# Patient Record
Sex: Male | Born: 1940
Health system: Southern US, Community
[De-identification: ages and names within clinical notes are randomized; demographics above are authoritative.]

## PROBLEM LIST (undated history)

## (undated) DIAGNOSIS — I499 Cardiac arrhythmia, unspecified: Secondary | ICD-10-CM

## (undated) DIAGNOSIS — M109 Gout, unspecified: Secondary | ICD-10-CM

## (undated) DIAGNOSIS — I480 Paroxysmal atrial fibrillation: Secondary | ICD-10-CM

## (undated) DIAGNOSIS — I10 Essential (primary) hypertension: Secondary | ICD-10-CM

## (undated) DIAGNOSIS — E785 Hyperlipidemia, unspecified: Secondary | ICD-10-CM

## (undated) DIAGNOSIS — H269 Unspecified cataract: Secondary | ICD-10-CM

## (undated) DIAGNOSIS — S129XXA Fracture of neck, unspecified, initial encounter: Secondary | ICD-10-CM

## (undated) DIAGNOSIS — M1990 Unspecified osteoarthritis, unspecified site: Secondary | ICD-10-CM

## (undated) DIAGNOSIS — C189 Malignant neoplasm of colon, unspecified: Secondary | ICD-10-CM

## (undated) DIAGNOSIS — D171 Benign lipomatous neoplasm of skin and subcutaneous tissue of trunk: Secondary | ICD-10-CM

## (undated) DIAGNOSIS — K635 Polyp of colon: Secondary | ICD-10-CM

## (undated) DIAGNOSIS — I639 Cerebral infarction, unspecified: Secondary | ICD-10-CM

## (undated) HISTORY — PX: COLON RESECTION: SHX5231

## (undated) HISTORY — DX: Paroxysmal atrial fibrillation: I48.0

## (undated) HISTORY — DX: Polyp of colon: K63.5

## (undated) HISTORY — DX: Essential (primary) hypertension: I10

## (undated) HISTORY — PX: SHOULDER SURGERY: SHX246

## (undated) HISTORY — PX: CATARACT EXTRACTION EXTRACAPSULAR: SHX1305

## (undated) HISTORY — PX: OTHER SURGICAL HISTORY: SHX169

## (undated) HISTORY — PX: VASECTOMY: SHX75

## (undated) HISTORY — PX: ANKLE SURGERY: SHX546

## (undated) HISTORY — DX: Fracture of neck, unspecified, initial encounter: S12.9XXA

## (undated) HISTORY — DX: Hyperlipidemia, unspecified: E78.5

## (undated) HISTORY — PX: ELBOW FRACTURE SURGERY: SHX616

## (undated) HISTORY — DX: Malignant neoplasm of colon, unspecified: C18.9

## (undated) HISTORY — DX: Unspecified cataract: H26.9

## (undated) HISTORY — PX: HERNIA REPAIR: SHX51

---

## 1998-05-25 ENCOUNTER — Emergency Department (HOSPITAL_COMMUNITY): Admission: EM | Admit: 1998-05-25 | Discharge: 1998-05-25 | Payer: Self-pay | Admitting: Emergency Medicine

## 1999-11-18 HISTORY — PX: APPENDECTOMY: SHX54

## 2000-06-30 ENCOUNTER — Encounter: Admission: RE | Admit: 2000-06-30 | Discharge: 2000-06-30 | Payer: Self-pay | Admitting: Endocrinology

## 2000-06-30 ENCOUNTER — Encounter: Payer: Self-pay | Admitting: Endocrinology

## 2000-07-01 ENCOUNTER — Encounter: Admission: RE | Admit: 2000-07-01 | Discharge: 2000-07-01 | Payer: Self-pay | Admitting: Endocrinology

## 2000-07-01 ENCOUNTER — Encounter: Payer: Self-pay | Admitting: Endocrinology

## 2000-07-13 ENCOUNTER — Encounter: Admission: RE | Admit: 2000-07-13 | Discharge: 2000-07-13 | Payer: Self-pay | Admitting: General Surgery

## 2000-07-14 ENCOUNTER — Encounter: Admission: RE | Admit: 2000-07-14 | Discharge: 2000-07-14 | Payer: Self-pay | Admitting: General Surgery

## 2000-07-14 ENCOUNTER — Encounter: Payer: Self-pay | Admitting: General Surgery

## 2000-09-17 ENCOUNTER — Encounter: Payer: Self-pay | Admitting: General Surgery

## 2000-09-22 ENCOUNTER — Inpatient Hospital Stay (HOSPITAL_COMMUNITY): Admission: RE | Admit: 2000-09-22 | Discharge: 2000-09-29 | Payer: Self-pay | Admitting: General Surgery

## 2000-09-28 ENCOUNTER — Encounter: Payer: Self-pay | Admitting: General Surgery

## 2000-10-30 ENCOUNTER — Ambulatory Visit (HOSPITAL_COMMUNITY): Admission: RE | Admit: 2000-10-30 | Discharge: 2000-10-30 | Payer: Self-pay | Admitting: General Surgery

## 2000-10-30 ENCOUNTER — Encounter: Payer: Self-pay | Admitting: General Surgery

## 2000-11-02 ENCOUNTER — Encounter: Admission: RE | Admit: 2000-11-02 | Discharge: 2000-11-02 | Payer: Self-pay | Admitting: Oncology

## 2000-11-02 ENCOUNTER — Encounter: Payer: Self-pay | Admitting: Oncology

## 2001-01-14 ENCOUNTER — Encounter: Payer: Self-pay | Admitting: General Surgery

## 2001-01-14 ENCOUNTER — Encounter: Admission: RE | Admit: 2001-01-14 | Discharge: 2001-01-14 | Payer: Self-pay | Admitting: General Surgery

## 2001-04-05 ENCOUNTER — Ambulatory Visit (HOSPITAL_BASED_OUTPATIENT_CLINIC_OR_DEPARTMENT_OTHER): Admission: RE | Admit: 2001-04-05 | Discharge: 2001-04-05 | Payer: Self-pay | Admitting: General Surgery

## 2001-12-02 ENCOUNTER — Ambulatory Visit (HOSPITAL_COMMUNITY): Admission: RE | Admit: 2001-12-02 | Discharge: 2001-12-02 | Payer: Self-pay | Admitting: Gastroenterology

## 2002-12-31 ENCOUNTER — Emergency Department (HOSPITAL_COMMUNITY): Admission: EM | Admit: 2002-12-31 | Discharge: 2002-12-31 | Payer: Self-pay | Admitting: Emergency Medicine

## 2003-02-11 ENCOUNTER — Encounter: Payer: Self-pay | Admitting: Emergency Medicine

## 2003-02-11 ENCOUNTER — Encounter: Payer: Self-pay | Admitting: Specialist

## 2003-02-11 ENCOUNTER — Emergency Department (HOSPITAL_COMMUNITY): Admission: EM | Admit: 2003-02-11 | Discharge: 2003-02-11 | Payer: Self-pay | Admitting: Emergency Medicine

## 2004-09-19 ENCOUNTER — Ambulatory Visit (HOSPITAL_COMMUNITY): Admission: RE | Admit: 2004-09-19 | Discharge: 2004-09-19 | Payer: Self-pay | Admitting: Gastroenterology

## 2004-09-19 ENCOUNTER — Ambulatory Visit: Payer: Self-pay | Admitting: Gastroenterology

## 2007-10-07 ENCOUNTER — Ambulatory Visit: Payer: Self-pay | Admitting: Internal Medicine

## 2007-10-19 ENCOUNTER — Ambulatory Visit: Payer: Self-pay | Admitting: Internal Medicine

## 2007-12-11 DIAGNOSIS — C180 Malignant neoplasm of cecum: Secondary | ICD-10-CM | POA: Insufficient documentation

## 2007-12-11 DIAGNOSIS — E109 Type 1 diabetes mellitus without complications: Secondary | ICD-10-CM | POA: Insufficient documentation

## 2007-12-11 DIAGNOSIS — D126 Benign neoplasm of colon, unspecified: Secondary | ICD-10-CM | POA: Insufficient documentation

## 2007-12-11 DIAGNOSIS — I10 Essential (primary) hypertension: Secondary | ICD-10-CM | POA: Insufficient documentation

## 2008-07-14 ENCOUNTER — Ambulatory Visit (HOSPITAL_COMMUNITY): Admission: RE | Admit: 2008-07-14 | Discharge: 2008-07-14 | Payer: Self-pay | Admitting: Otolaryngology

## 2008-07-14 ENCOUNTER — Encounter (INDEPENDENT_AMBULATORY_CARE_PROVIDER_SITE_OTHER): Payer: Self-pay | Admitting: Otolaryngology

## 2010-11-25 ENCOUNTER — Encounter (INDEPENDENT_AMBULATORY_CARE_PROVIDER_SITE_OTHER): Payer: Self-pay | Admitting: *Deleted

## 2010-11-27 ENCOUNTER — Encounter (INDEPENDENT_AMBULATORY_CARE_PROVIDER_SITE_OTHER): Payer: Self-pay | Admitting: *Deleted

## 2010-12-17 NOTE — Procedures (Signed)
Summary: Gastroenterology Colon  Gastroenterology Colon   Imported By: Christie Nottingham 12/16/2007 11:58:15  _____________________________________________________________________  External Attachment:    Type:   Image     Comment:   External Document

## 2010-12-19 NOTE — Letter (Signed)
Summary: Pre Visit Letter Revised  [name]  [address] [place], [place] [address]   Phone: [phone]  Fax: [phone]        11/27/2010 MRN: [id number] Curtis Dunn [address] [place], [place]  [address]             Procedure Date: 01/14/2011 @ 10:00   Recall colon-Dr.Brodie   Welcome to the Gastroenterology Division at [facility].    You are scheduled to see a nurse for your pre-procedure visit on 12/31/2010 at 8:30 on the 3rd floor at [facility], [address]  We ask that you try to arrive at our office 15 minutes prior to your appointment time to allow for check-in.  Please take a minute to review the attached form.  If you answer "Yes" to one or more of the questions on the first page, we ask that you call the person listed at your earliest opportunity.  If you answer "No" to all of the questions, please complete the rest of the form and bring it to your appointment.    Your nurse visit will consist of discussing your medical and surgical history, your immediate family medical history, and your medications.   If you are unable to list all of your medications on the form, please bring the medication bottles to your appointment and we will list them.  We will need to be aware of both prescribed and over the counter drugs.  We will need to know exact dosage information as well.    Please be prepared to read and sign documents such as consent forms, a financial agreement, and acknowledgement forms.  If necessary, and with your consent, a friend or relative is welcome to sit-in on the nurse visit with you.  Please bring your insurance card so that we may make a copy of it.  If your insurance requires a referral to see a specialist, please bring your referral form from your primary care physician.  No co-pay is required for this nurse visit.     If you cannot keep your appointment, please call [phone] to cancel or reschedule prior to  your appointment date.  This allows [place] the opportunity to schedule an appointment for another patient in need of care.    Thank you for choosing [facility] for your medical needs.  We appreciate the opportunity to care for you.  Please visit [place] at our website  to learn more about our practice.  Sincerely, The Gastroenterology Division

## 2010-12-19 NOTE — Letter (Signed)
Summary: Colonoscopy Letter  [facility]  [address] [place], [place] [address]   Phone: [phone]  Fax: [phone]      November 25, 2010 MRN: [id number]   Curtis Dunn [address] [place], [place]  [address]   Dear Mr. Caddell,   According to your medical record, it is time for you to schedule a Colonoscopy. The American Cancer Society recommends this procedure as a method to detect early colon cancer. Patients with a family history of colon cancer, or a personal history of colon polyps or inflammatory bowel disease are at increased risk.  This letter has been generated based on the recommendations made at the time of your procedure. If you feel that in your particular situation this may no longer apply, please contact our office.  Please call our office at [phone] to schedule this appointment or to update your records at your earliest convenience.  Thank you for cooperating with [place] to provide you with the very best care possible.   Sincerely,  Hedwig Morton. Juanda Chance, M.D.  [facility] Gastroenterology Division [phone]

## 2010-12-27 ENCOUNTER — Encounter (INDEPENDENT_AMBULATORY_CARE_PROVIDER_SITE_OTHER): Payer: Self-pay

## 2010-12-31 ENCOUNTER — Encounter: Payer: Self-pay | Admitting: Internal Medicine

## 2011-01-08 NOTE — Letter (Signed)
Summary: [facility] Instructions  [facility]  [address] [place], [place] [address]   Phone: [phone]  Fax: [phone]       Curtis Dunn    10-06-1941    MRN: [id number]       Procedure Day /Date:  Tuesday 01/14/2011     Arrival Time: 9:00 am     Procedure Time: 10:00 am     Location of Procedure:                    _x _   Endoscopy Center (4th Floor)    PREPARATION FOR COLONOSCOPY WITH MIRALAX  Starting 5 days prior to your procedure Thursday 2/23 do not eat nuts, seeds, popcorn, corn, beans, peas,  salads, or any raw vegetables.  Do not take any fiber supplements (e.g. Metamucil, Citrucel, and Benefiber). ____________________________________________________________________________________________________   THE DAY BEFORE YOUR PROCEDURE         DATE: Monday 2/27  1   Drink clear liquids the entire day-NO SOLID FOOD  2   Do not drink anything colored red or purple.  Avoid juices with pulp.  No orange juice.  3   Drink at least 64 oz. (8 glasses) of fluid/clear liquids during the day to prevent dehydration and help the prep work efficiently.  CLEAR LIQUIDS INCLUDE: Water Jello Ice Popsicles Tea (sugar ok, no milk/cream) Powdered fruit flavored drinks Coffee (sugar ok, no milk/cream) Gatorade Juice: apple, white grape, white cranberry  Lemonade Clear bullion, consomm, broth Carbonated beverages (any kind) Strained chicken noodle soup Hard Candy  4   Mix the entire bottle of Miralax with 64 oz. of Gatorade/Powerade in the morning and put in the refrigerator to chill.  5   At 3:00 pm take 2 Dulcolax/Bisacodyl tablets.  6   At 4:30 pm take one Reglan/Metoclopramide tablet.  7  Starting at 5:00 pm drink one 8 oz glass of the Miralax mixture every 15-20 minutes until you have finished drinking the entire 64 oz.  You should finish drinking prep around 7:30 or 8:00 pm.  8   If you are nauseated, you may take the 2nd Reglan/Metoclopramide  tablet at 6:30 pm.        9    At 8:00 pm take 2 more DULCOLAX/Bisacodyl tablets.     THE DAY OF YOUR PROCEDURE      DATE:  Tuesday 2/28  You may drink clear liquids until 8:00 am (2 HOURS BEFORE PROCEDURE).   MEDICATION INSTRUCTIONS  Unless otherwise instructed, you should take regular prescription medications with a small sip of water as early as possible the morning of your procedure.  Diabetic patients - see separate instructions.  Additional medication instructions: Do not take HCTZ am of procedure.         OTHER INSTRUCTIONS  You will need a responsible adult at least 70 years of age to accompany you and drive you home.   This person must remain in the waiting room during your procedure.  Wear loose fitting clothing that is easily removed.  Leave jewelry and other valuables at home.  However, you may wish to bring a book to read or an iPod/MP3 player to listen to music as you wait for your procedure to start.  Remove all body piercing jewelry and leave at home.  Total time from sign-in until discharge is approximately 2-3 hours.  You should go home directly after your procedure and rest.  You can resume normal activities the day after  your procedure.  The day of your procedure you should not:   Drive   Make legal decisions   Operate machinery   Drink alcohol   Return to work  You will receive specific instructions about eating, activities and medications before you leave.   The above instructions have been reviewed and explained to me by   Ulis Rias RN  December 31, 2010 8:57 AM     I fully understand and can verbalize these instructions _____________________________ Date _______

## 2011-01-08 NOTE — Letter (Signed)
Summary: Diabetic Instructions  [facility]  [address] [place], [place] [address]   Phone: [phone]  Fax: [phone]    Curtis Dunn 1941-11-10 MRN: [id number]   _  _   ORAL DIABETIC MEDICATION INSTRUCTIONS  The day before your procedure:   Take your diabetic pill as you do normally  The day of your procedure:   Do not take your diabetic pill    We will check your blood sugar levels during the admission process and again in Recovery before discharging you home  ________________________________________________________________________  _  _   INSULIN (LONG ACTING) MEDICATION INSTRUCTIONS (Lantus, NPH, 70/30, Humulin, Novolin-N)   The day before your procedure:   Take  your regular evening dose    The day of your procedure:   Do not take your morning dose    _  _   INSULIN (SHORT ACTING) MEDICATION INSTRUCTIONS (Regular, Humulog, Novolog)   The day before your procedure:   Do not take your evening dose   The day of your procedure:   Do not take your morning dose   _  _   INSULIN PUMP MEDICATION INSTRUCTIONS  We will contact the physician managing your diabetic care for written dosage instructions for the day before your procedure and the day of your procedure.  Once we have received the instructions, we will contact you.

## 2011-01-08 NOTE — Miscellaneous (Signed)
Summary: Lec previsit..  Clinical Lists Changes  Medications: Added new medication of MIRALAX   POWD (POLYETHYLENE GLYCOL 3350) As per prep  instructions. - Signed Added new medication of REGLAN 10 MG  TABS (METOCLOPRAMIDE HCL) As per prep instructions. - Signed Added new medication of DULCOLAX 5 MG  TBEC (BISACODYL) Day before procedure take 2 at 3pm and 2 at 8pm. - Signed Rx of MIRALAX   POWD (POLYETHYLENE GLYCOL 3350) As per prep  instructions.;  #255gm x 0;  Signed;  Entered by: Ulis Rias RN;  Authorized by: Hart Carwin MD;  Method used: Electron[facility] to [facility] [phone]*, [address] [place], [place]  [address], Ph: [phone] or [phone], Fax: [phone] Rx of REGLAN 10 MG  TABS (METOCLOPRAMIDE HCL) As per prep instructions.;  #2 x 0;  Signed;  Entered by: Ulis Rias RN;  Authorized by: Hart Carwin MD;  Method used: Electronically to [facility] [phone]*, [address], [place], [place]  [address], Ph: [phone] or [phone], Fax: [phone] Rx of DULCOLAX 5 MG  TBEC (BISACODYL) Day before procedure take 2 at 3pm and 2 at 8pm.;  #4 x 0;  Signed;  Entered by: Ulis Rias RN;  Authorized by: Hart Carwin MD;  Method used: Electronically to [facility] [phone]*, [address] [place], [place]  [address], Ph: [phone] or [phone], Fax: [phone] Observations: Added new observation of NKA: T (12/31/2010 8:33)    Prescriptions: DULCOLAX 5 MG  TBEC (BISACODYL) Day before procedure take 2 at 3pm and 2 at 8pm.  #4 x 0   Entered by:   Ulis Rias RN   Authorized by:   Hart Carwin MD   Signed by:   Ulis Rias RN on 12/31/2010   Method used:   Electronically to        [facility] [phone]* (retail)       [address]       [place], [place]  [address]       Ph: [phone] or [phone]       Fax: [phone]   RxID:   [id number] REGLAN 10 MG  TABS (METOCLOPRAMIDE HCL) As per prep instructions.  #2 x 0   Entered by:   Ulis Rias RN   Authorized by:   Hart Carwin MD   Signed by:   Ulis Rias RN on 12/31/2010   Method used:   Electronically to        [facility] [phone]* (retail)       [address]       [place], [place]  [address]       Ph: [phone] or [phone]       Fax: [phone]   RxID:   [id number] MIRALAX   POWD (POLYETHYLENE GLYCOL 3350) As per prep  instructions.  #255gm x 0   Entered by:   Ulis Rias RN   Authorized by:   Hart Carwin MD   Signed by:   Ulis Rias RN on 12/31/2010   Method used:   Electronically to        [facility] [phone]* (retail)       [address]       [place], [place]  [address]       Ph: [phone] or [phone]       Fax: [phone]   RxID:   [id number]

## 2011-01-14 ENCOUNTER — Other Ambulatory Visit (AMBULATORY_SURGERY_CENTER): Payer: Medicare Other | Admitting: Internal Medicine

## 2011-01-14 ENCOUNTER — Other Ambulatory Visit: Payer: Self-pay | Admitting: Internal Medicine

## 2011-01-14 DIAGNOSIS — Z85038 Personal history of other malignant neoplasm of large intestine: Secondary | ICD-10-CM

## 2011-01-14 DIAGNOSIS — Z1211 Encounter for screening for malignant neoplasm of colon: Secondary | ICD-10-CM

## 2011-01-14 LAB — GLUCOSE, CAPILLARY
Glucose-Capillary: 165 mg/dL — ABNORMAL HIGH (ref 70–99)
Glucose-Capillary: 138 mg/dL — ABNORMAL HIGH (ref 70–99)

## 2011-01-23 NOTE — Procedures (Signed)
Summary: Colonoscopy  Patient: Curtis Dunn Note: All result statuses are Final unless otherwise noted.  Tests: (1) Colonoscopy (COL)   COL Colonoscopy           DONE     [facility]     [address]     [place], [place]  [address]           COLONOSCOPY PROCEDURE REPORT           PATIENT:  Curtis Dunn, Curtis Dunn  MR#:  [id number]     BIRTHDATE:  06/12/1941, 69 yrs. old  GENDER:  male     ENDOSCOPIST:  Hedwig Morton. Juanda Chance, MD     REF. BY:  Adela Lank, M.D.     PROCEDURE DATE:  01/14/2011     PROCEDURE:  Colonoscopy 295[address]     ASA CLASS:  Class II     INDICATIONS:  screening     MEDICATIONS:   Versed 10 mg, Fentanyl 100 mcg           DESCRIPTION OF PROCEDURE:   After the risks benefits and     alternatives of the procedure were thoroughly explained, informed     consent was obtained.  Digital rectal exam was performed and     revealed no rectal masses.   The LB [id number]-[id number] endoscope     was introduced through the anus and advanced to the anastomosis,     without limitations.  The quality of the prep was poor, using     MiraLax.  The instrument was then slowly withdrawn as the colon     was fully examined.     <<PROCEDUREIMAGES>>           FINDINGS:  The right colon was surgically resected and an     ileo-colonic anastamosis was seen (see image2, image3, image6,     image5, and image7). no evidence of recurrent cancer, functioning     ileo colic anastomosis  This was otherwise a normal examination of     the colon (see image9).   Retroflexed views in the rectum revealed     no abnormalities.    The scope was then withdrawn from the patient     and the procedure completed.           COMPLICATIONS:  None     ENDOSCOPIC IMPRESSION:     1) Prior right hemi-colectomy     2) Otherwise normal examination     no evidence of recurrent cancer,     poor prep     RECOMMENDATIONS:     next time Propofol,     split doudle prep next time     REPEAT EXAM:  In 3 year(s)  for.  shorter interval due to     suboptimal visoalization           ______________________________     Hedwig Morton. Juanda Chance, MD           CC:           n.     eSIGNED:   Hedwig Morton. Erin Obando at 01/14/2011 11:20 AM           Melven Sartorius, [id number]  Note: An exclamation mark (!) indicates a result that was not dispersed into the flowsheet. Document Creation Date: 01/14/2011 11:20 AM _______________________________________________________________________  (1) Order result status: Final Collection or observation date-time: 01/14/2011 11:07 Requested date-time:  Receipt date-time:  Reported date-time:  Referring Physician:   Ordering Physician: Lina Sar (  [id number]) Specimen Source:  Source: Launa Grill Order Number: [id number] Lab site:   Appended Document: Colonoscopy    Clinical Lists Changes  Observations: Added new observation of COLONNXTDUE: 12/2013 (01/14/2011 12:52)

## 2011-04-01 NOTE — Op Note (Signed)
NAME:  Curtis Dunn, Curtis Dunn           ACCOUNT NO.:  [id number]   MEDICAL RECORD NO.:  [id number]          PATIENT TYPE:  AMB   LOCATION:  SDS                          FACILITY:  [facility]   PHYSICIAN:  Kinnie Scales. Annalee Genta, M.D.DATE OF BIRTH:  01-13-1941   DATE OF PROCEDURE:  07/14/2008  DATE OF DISCHARGE:                               OPERATIVE REPORT   PREOPERATIVE DIAGNOSES:  1. Deviated nasal septum with nasal airway obstruction.  2. Bilateral inferior turbinate hypertrophy.  3. History of prior nasal septoplasty.   POSTOPERATIVE DIAGNOSES:  1. Deviated nasal septum with nasal airway obstruction.  2. Bilateral inferior turbinate hypertrophy.  3. History of prior nasal septoplasty.  4. Bilateral nasal polyposis.   SURGICAL PROCEDURES:  1. Revision nasal septoplasty.  2. Bilateral inferior turbinate reduction.  3. Bilateral intranasal polypectomy.   ANESTHESIA:  General endotracheal.   COMPLICATIONS:  None.   ESTIMATED BLOOD LOSS:  50 mL.   The patient was transferred from the operating room to the recovery in  stable condition.   BRIEF HISTORY:  Curtis Dunn is a 70 year old white male who is referred  for evaluation of progressive symptoms of nasal airway obstruction.  The  patient has undergone prior nasal septoplasty approximately 20-30 years  ago and had noted worsening breathing on the right-hand side.  Examination in the office revealed a moderate amount of intranasal  scarring and a significantly deviated right nasal septum with a large  septal spur occluding the entire right nasal cavity.  The patient also  had turbinate hypertrophy and moderate erythema of the mucosa.  He had  been previously treated with antibiotics and allergy medications without  improvement.  Given his history and physical examination, I recommended  we consider him for septoplasty and turbinate reduction.  The risk and  benefits and possible complications of the procedure were discussed in  detail with the patient and he understood and concurred with our plan  for surgery, which was scheduled for [facility] OR as an  outpatient.   ADDENDUM:  During the patient's surgical procedure, a significant right  nasal polyp was identified and this was resected.  The intranasal  polypectomy with a microdebrider.  This was an unconsented procedure,  but was deemed necessary based on the patient's history.   SURGICAL PROCEDURE:  The patient was brought to the operating room on  July 14, 2008, and placed in a supine position on the operating table.  General endotracheal anesthesia was established without difficulty.  When the patient was adequately anesthetized, he was positioned on the  operating table and prepped and draped in a sterile fashion.  Surgical  procedure was begun by injecting the patient with 1% lidocaine 1:100,000  solution epinephrine.  Total of 9 mL was used.  This was injected in a  submucosal fashion on the nasal septum and inferior turbinates.  The  same solution was also used to inject the intranasal polyps, and the  patient's nose was then packed with Afrin-soaked cottonoid pledgets,  which were left in place for approximately 10 minutes and allowed for  vasoconstriction.  The patient was prepped and  draped in sterile fashion  and positioned on the operating table.  The surgical procedure begun  with nasal septoplasty.  A right anterior hemitransfixion incision was  created and a mucoperichondrial flap was elevated.  There was a large  inferior bony septal spur, which was completely occluding the right  nasal cavity.  Overlying mucosa was mobilized and the spur was resected  with a combination of sharp dissection and a 4-mm osteotome and the bony  spur had been removed.  The nasal septum was then brought to the midline  and no further dissection was carried out.  With the nasal septum  brought to the midline, the right nasal cavity was then  available for  further inspection and the patient was found to have a large intranasal  polyp with occlusion of the posterior aspect of the nasal passageway.  The septal surgery was completed with reapproximation of the  mucoperichondrial flaps with 4-0 gut suture on a Keith needle in a  horizontal mattress fashion.  At the conclusion of the surgery,  bilateral Doyle nasal septal splints were placed after the application  of Bactroban ointment and were sutured into position with a 3-0 Ethilon  suture.   Inferior turbinate reduction was then performed with bipolar cautery set  at 12 watts.  Two submucosal passes were made in each inferior  turbinate.  When the inferior turbinates had been adequately cauterized,  they were then outfractured.  Small incisions were created in the  anterior aspect of each inferior turbinate.  The overlying mucosa was  elevated, and turbinate bone was resected with through-cutting forceps.  The turbinates were then refractured to the lateral wall creating a  widely patent nasal cavity.  There was minimal bleeding.   Attention was then turned to the nasal polyps.  Using a straight  microdebrider under direct visualization, intranasal polypectomy was  performed.  The entire nasal polyp, which emanated from the posterior  middle meatus was resected with a straight microdebrider under direct  visualization.  The polyp was resected from the nasal cavity and  posterior nasopharynx and sent to pathology for gross microscopic  examination.  Inspection of the left nasal cavity showed a small polyp  in the same location along the posterior ethmoid region, which was also  resected with a straight microdebrider.  There was minimal bleeding.  A  small amount of Afrin-soaked cottonoid pledget was placed over the  resection site and then removed prior to awakening the patient from  anesthesia.   The patient's nasal cavity and nasopharynx were then irrigated and   suctioned.  An orogastric tube was passed and stomach contents were  aspirated.  The patient was then extubated and was transferred from the  operating room to the recovery in stable condition with no complications  and blood loss was approximately 50 mL.           ______________________________  Kinnie Scales. Annalee Genta, M.D.     DLS/MEDQ  D:  91/47/8295  T:  07/15/2008  Job:  [id number]

## 2011-04-01 NOTE — Assessment & Plan Note (Signed)
[facility]                         GASTROENTEROLOGY OFFICE NOTE   Curtis Dunn                  MRN:          [id number]  DATE:10/07/2007                            DOB:          [birth year]    [name] is a 70 year old gentleman with a history of colon cancer,  status post right hemicolectomy for cecal adenocarcinoma by Dr. Maryagnes Amos in  2001.  He had a follow-up colonoscopy in 2003 with finding of a small  polyp, but no histology report has been found in the chart.  He also had  a follow-up colonoscopy in 2005, which was normal.  He is asymptomatic.  His bowel habits are somewhat loose, but he denies abdominal pain.  He  is due for a complete physical exam by Dr. Juleen China on December 18 of this  year.   Since the last colonoscopy, his wife died and he is worried about  possible hypoglycemia during his prep for colonoscopy.  His wife used to  watch over him.   MEDICATIONS:  1. Humalog insulin 15 units in the a.m., 5 at lunch, and 10 units in      the p.m.  2. Lantus insulin 11 units in the a.m. and 10 units nightly.  3. Benicar/HCT 40/25 1 daily.  4. Amlodipine 10 mg p.o. daily.  5. Advicor/Lovastatin 500/20 1 p.o. daily.  6. Aspirin 81 mg p.o. daily.  7. Flomax 0.4 mg p.o. daily.  8. B12.  9. Beta sitosterol.  10.Vitamin C.  11.DHA.  12.Vitamin E.   PHYSICAL EXAMINATION:  Blood pressure 138/70, pulse 64, weight 172  pounds.  He was alert and oriented in no distress.  SKIN:  Warm and dry.  LUNGS:  Clear to auscultation.  COR:  Normal S1 and S2.  ABDOMEN:  Soft, nontender, normoactive bowel sounds.  Well-healed  surgical scar in the midline.  RECTAL:  Not done.  EXTREMITIES:  No edema.   IMPRESSION:  A 70 year old white male was due for colonoscopy, follow-up  for cecal carcinoma, resected in 2001.  This will be his third  colonoscopy.   PLAN:  Patient will need adjustment of his insulin dose prior to the  colonoscopy prep,  which will consist of Osmoprep pills.  We will reduce  his insulin dose to half and hold his insulin on the day of the exam.  I  have gone over the conscious sedation as well as the prep for the  patient.     Curtis Dunn. Juanda Chance, MD  Electronically Signed    Curtis Dunn  DD: 10/07/2007  DT: 10/07/2007  Job #: [id number]   cc:   Curtis Dunn, M.D.

## 2011-04-04 NOTE — Consult Note (Signed)
NAME:  Curtis Dunn, REAM NO.:  [id number]   MEDICAL RECORD NO.:  [id number]                   PATIENT TYPE:  EMS   LOCATION:  ED                                   FACILITY:  [facility]   PHYSICIAN:  Erasmo Leventhal, M.D.         DATE OF BIRTH:  07-02-1941   DATE OF CONSULTATION:  02/11/2003  DATE OF DISCHARGE:                                   CONSULTATION   HISTORY OF PRESENT ILLNESS:  The patient is a 70 year old Caucasian male who  fell tonight while walking his dog.  He sustained a hypoglycemia episode.  He was noted to be unable to ambulate at that point in time, and was brought  to [facility] Emergency Room.  There, his blood sugars have been stabilized  by Dr. Radford Pax, and I was asked to see him for a right ankle fracture  subluxation, and new dislocation.   I evaluated the patient in the emergency room.  He was awake and alert.  He  only complains of right ankle pain.  He comes accompanied by his wife, Dewayne Hatch.   ALLERGIES:  No known drug allergies.   CURRENT MEDICATIONS:  1. Insulin.  2. Altace.  3. Hydrochlorothiazide.  4. Beta ______ vitamin.   PRIMARY CARE PHYSICIAN:  Dr. Lucianne Muss.   PAST MEDICAL HISTORY:  1. Hypertension.  2. Type 1 diabetes.   PAST SURGICAL HISTORY:  1. He has had multiple right shoulder surgeries.  2. Left thumb by myself.  3. Cancer found in the colon when he had an appendectomy done.  He underwent     chemotherapy for that two years ago, and there has been no evidence of     recurrence.   PHYSICAL EXAMINATION:  GENERAL:  He is awake and alert, answers questions  appropriately, accompanied by his wife.  EXTREMITIES:  His right foot and ankle is obviously externally rotated  laterally.  He has an abrasion on the medial side of his ankle.  He also has  puckering of the medial malleolus region with bruising there.  He has  decrease in pulses bilaterally which I think is secondary to his chronic  diabetes, but he  has good capillary refill.  Sensation is intact to light  touch and slightly decreased bilaterally.  The compartments are soft.   LABORATORY DATA:  Plain x-rays show a severely subluxed nearly dislocated  trimalleolar ankle fracture.   I discussed treatment options with the patient and his wife in detail.  I  have recommended cleansing the abrasion and closed reduction.  They agree.   PROCEDURE:  Abrasion is cleansed with hydrogen peroxide.  Sterile dressing  is applied.  IV sedation was given with Dilaudid and Versed appropriately  dosed.  He remained awake during the procedure, but was more comfortable.  Following a general closed reduction of the ankle with palpable and visual  reduction, application of a _____ plaster splints.  Circulation remained  intact.  After  this, he felt much more comfortable.   Post-reduction x-rays are satisfactory.   IMPRESSION:  1. Right ankle trimalleolar fracture subluxation with abrasion and skin     bruising.  2. Hypoglycemic episode as above.  3. Medical problems as above.   RECOMMENDATIONS:  At this time a tetanus toxin will be given, IV Ancef 1 g.  Stabilize his blood sugars and discharged to home.  We will ask him to  elevate as much as possible.  I will evaluate him in my office on Monday  afternoon to look at the swelling, and also to check the skin abrasion to  make sure that everything is okay.  If the skin is fine, we will proceed  with surgery on Tuesday.  He understands the possibility of infection, deep  vein thrombosis, thromboembolism, skin breakdown, and etc.  Possible need  for delay surgery secondary to the skin injuries sustained with his initial  injury.  All questions were encouraged and answered with the patient and his  wife in detail.  I will follow up with him on Monday afternoon.  They are to  call between now and then with any problems.   DISCHARGE MEDICATIONS:  1. Keflex.  2. Vicodin.                                                  Erasmo Leventhal, M.D.    RAC/MEDQ  D:  02/11/2003  T:  02/11/2003  Job:  [id number]

## 2011-12-31 ENCOUNTER — Telehealth: Payer: Self-pay | Admitting: *Deleted

## 2011-12-31 NOTE — Telephone Encounter (Signed)
CPET with PFT's would like to have these done.  Last seen in 2006.  Will look more into it and see if he would like to have and call back

## 2012-03-02 ENCOUNTER — Telehealth: Payer: Self-pay | Admitting: Cardiology

## 2012-03-02 NOTE — Telephone Encounter (Signed)
New msg Pt wants to talk to you about getting an ekg. Please call

## 2012-03-02 NOTE — Telephone Encounter (Signed)
Spoke with patient and explained  Dr. Patty Sermons could see him for office visit.  Patient states not really having any problems and he would think about it an call us back

## 2012-03-04 ENCOUNTER — Telehealth: Payer: Self-pay | Admitting: Cardiology

## 2012-03-04 NOTE — Telephone Encounter (Signed)
Follow-up:     Patient called back. Please call back.

## 2012-03-04 NOTE — Telephone Encounter (Signed)
Scheduled appointment for patient tomorrow at 11:30.  Requested old chart from storage- spoke to [employer]

## 2012-03-05 ENCOUNTER — Encounter: Payer: Self-pay | Admitting: Cardiology

## 2012-03-05 ENCOUNTER — Ambulatory Visit (INDEPENDENT_AMBULATORY_CARE_PROVIDER_SITE_OTHER): Payer: Medicare Other | Admitting: Cardiology

## 2012-03-05 VITALS — BP 130/78 | HR 60 | Ht 72.0 in | Wt 169.0 lb

## 2012-03-05 DIAGNOSIS — E109 Type 1 diabetes mellitus without complications: Secondary | ICD-10-CM

## 2012-03-05 DIAGNOSIS — R002 Palpitations: Secondary | ICD-10-CM | POA: Insufficient documentation

## 2012-03-05 DIAGNOSIS — I10 Essential (primary) hypertension: Secondary | ICD-10-CM

## 2012-03-05 NOTE — Patient Instructions (Signed)
Your physician recommends that you continue on your current medications as directed. Please refer to the Current Medication list given to you today.  Follow up as needed  

## 2012-03-05 NOTE — Progress Notes (Signed)
Romana Juniper Date of Birth:  1941/11/16 [facility] [address] [place], [place]  [address] [phone]         Fax   [phone]  History of Present Illness: This pleasant 71 year old diabetic gentleman is seen after a long absence.  We last saw him in 2006.  He comes in today because of concern over a previous abnormal EKG and he brought the EKG with him today.  The patient has not been experiencing any chest pain.  He does not have any history of ischemic heart disease.  He had a normal nuclear treadmill stress test in 2006 because of a frequent PVCs and his stress test at that time showed no evidence of ischemia and his ejection fraction was 57%.  He goes to the gym on a regular basis and exercises and has no symptoms of chest pain or shortness of breath.  He has occasional palpitations occur mainly at rest.  He does eat a small amount of chocolate but no other caffeine.  Current Outpatient Prescriptions  Medication Sig Dispense Refill  . amLODipine (NORVASC) 10 MG tablet Take 10 mg by mouth daily.      . Ascorbic Acid (VITAMIN C) 1000 MG tablet Take 1,000 mg by mouth daily.      [name] aspirin 81 MG tablet Take 81 mg by mouth daily.      . Cholecalciferol 1000 UNITS capsule Take 1,000 Units by mouth daily.      . Cyanocobalamin (VITAMIN B 12 PO) Take by mouth. Taking 1000 daily      . Docosahexaenoic Acid (DHA OMEGA 3 PO) Take by mouth daily.      . fluticasone (VERAMYST) 27.5 MCG/SPRAY nasal spray Place 2 sprays into the nose daily.      . furosemide (LASIX) 10 MG/ML solution Take by mouth daily. Taking every other day      . hydrochlorothiazide (HYDRODIURIL) 25 MG tablet Take 25 mg by mouth daily.      . insulin glargine (LANTUS) 100 UNIT/ML injection Inject into the skin at bedtime. Taking 22 units daily      . insulin lispro (HUMALOG) 100 UNIT/ML injection Inject into the skin 3 (three) times daily before meals. Taking 15 am 5 noon and 9 pm      .  lovastatin (MEVACOR) 20 MG tablet Take 20 mg by mouth at bedtime.      . ramipril (ALTACE) 10 MG tablet Take 10 mg by mouth daily.      . Tamsulosin HCl (FLOMAX) 0.4 MG CAPS Take by mouth daily.      . vitamin E 400 UNIT capsule Take 400 Units by mouth daily.      . Zinc 30 MG CAPS Take by mouth daily.      [name] zolpidem (AMBIEN) 10 MG tablet Take 10 mg by mouth at bedtime as needed.        No Known Allergies  Patient Active Problem List  Diagnoses  . MALIGNANT NEOPLASM OF CECUM  . COLONIC POLYPS  . DIABETES MELLITUS, TYPE I  . HYPERTENSION    History  Smoking status  . Never Smoker   Smokeless tobacco  . Not on file    History  Alcohol Use: Not on file    No family history on file.  Review of Systems: Constitutional: no fever chills diaphoresis or fatigue or change in weight.  Head and neck: no hearing loss, no epistaxis, no photophobia or visual disturbance. Respiratory: No cough, shortness  of breath or wheezing. Cardiovascular: No chest pain peripheral edema, palpitations. Gastrointestinal: No abdominal distention, no abdominal pain, no change in bowel habits hematochezia or melena. Genitourinary: No dysuria, no frequency, no urgency, no nocturia. Musculoskeletal:No arthralgias, no back pain, no gait disturbance or myalgias. Neurological: No dizziness, no headaches, no numbness, no seizures, no syncope, no weakness, no tremors. Hematologic: No lymphadenopathy, no easy bruising. Psychiatric: No confusion, no hallucinations, no sleep disturbance.    Physical Exam: Filed Vitals:   03/05/12 1140  BP: 130/78  Pulse: 60   the general appearance reveals a well-developed well-nourished gentleman in no distress.The head and neck exam reveals pupils equal and reactive.  Extraocular movements are full.  There is no scleral icterus.  The mouth and pharynx are normal.  The neck is supple.  The carotids reveal no bruits.  The jugular venous pressure is normal.  The  thyroid is not  enlarged.  There is no lymphadenopathy.  The chest is clear to percussion and auscultation.  There are no rales or rhonchi.  Expansion of the chest is symmetrical.  The precordium is quiet.  The first heart sound is normal.  The second heart sound is physiologically split.  There is no murmur gallop rub or click.  There is no abnormal lift or heave.  The abdomen is soft and nontender.  The bowel sounds are normal.  The liver and spleen are not enlarged.  There are no abdominal masses.  There are no abdominal bruits.  Extremities reveal good pedal pulses.  There is no phlebitis or edema.  There is no cyanosis or clubbing.  Strength is normal and symmetrical in all extremities.  There is no lateralizing weakness.  There are no sensory deficits.  The skin is warm and dry.  There is no rash.  EKG shows normal sinus rhythm with left axis deviation and no ischemic changes.  The tracing is unchanged from his prior outpatient tracing.  There is no evidence of ischemia.   Assessment / Plan: The patient was reassured that he is doing well.  No further cardiac testing indicated at this point.  I encouraged him to continue to go to the gym regularly for exercise.  No new medications are necessary.  Recheck when necessary

## 2012-03-05 NOTE — Assessment & Plan Note (Addendum)
The patient has insulin-dependent diabetes.  He is taking very good care of himself.  He is very fastidious about his diabetes and his insulin.  His hemoglobin A1c has been essentially normal

## 2012-03-05 NOTE — Assessment & Plan Note (Signed)
The palpitations are infrequent.  They are characterized by a single premature beats as opposed to a run of rapid heart action.  The palpitations are not associated with any chest pain or shortness of breath.

## 2012-03-05 NOTE — Assessment & Plan Note (Signed)
His blood pressure has been stable on current therapy and is followed by Dr. Juleen China.

## 2012-04-29 ENCOUNTER — Encounter: Payer: Self-pay | Admitting: *Deleted

## 2012-08-08 ENCOUNTER — Ambulatory Visit (INDEPENDENT_AMBULATORY_CARE_PROVIDER_SITE_OTHER): Payer: Medicare Other | Admitting: Family Medicine

## 2012-08-08 VITALS — BP 122/70 | HR 77 | Temp 97.7°F | Resp 16 | Ht 72.0 in | Wt 171.0 lb

## 2012-08-08 DIAGNOSIS — L02619 Cutaneous abscess of unspecified foot: Secondary | ICD-10-CM

## 2012-08-08 DIAGNOSIS — L03115 Cellulitis of right lower limb: Secondary | ICD-10-CM

## 2012-08-08 LAB — POCT CBC
Granulocyte percent: 64.6 %G (ref 37–80)
HCT, POC: 46.5 % (ref 43.5–53.7)
Hemoglobin: 14.6 g/dL (ref 14.1–18.1)
Lymph, poc: 1.4 (ref 0.6–3.4)
MCH, POC: 31 pg (ref 27–31.2)
MCHC: 31.4 g/dL — AB (ref 31.8–35.4)
MCV: 98.7 fL — AB (ref 80–97)
MID (cbc): 0.8 (ref 0–0.9)
MPV: 9 fL (ref 0–99.8)
POC Granulocyte: 3.9 (ref 2–6.9)
POC LYMPH PERCENT: 22.7 %L (ref 10–50)
POC MID %: 12.7 %M — AB (ref 0–12)
Platelet Count, POC: 207 10*3/uL (ref 142–424)
RBC: 4.71 M/uL (ref 4.69–6.13)
RDW, POC: 13.8 %
WBC: 6 10*3/uL (ref 4.6–10.2)

## 2012-08-08 MED ORDER — DOXYCYCLINE HYCLATE 100 MG PO TABS: 100.0000 mg | ORAL_TABLET | Freq: Two times a day (BID) | ORAL | Status: DC

## 2012-08-08 MED ORDER — CEFTRIAXONE SODIUM 1 G IJ SOLR
1.0000 g | INTRAMUSCULAR | Status: DC
  Administered 2012-08-08: 1 g via INTRAMUSCULAR

## 2012-08-08 NOTE — Patient Instructions (Addendum)
Follow up in 24 hoursCellulitis Cellulitis is an infection of the skin and the tissue beneath it. The area is typically red and tender. It is caused by germs (bacteria) (usually staph or strep) that enter the body through cuts or sores. Cellulitis most commonly occurs in the arms or lower legs.  HOME CARE INSTRUCTIONS   If you are given a prescription for medications which kill germs (antibiotics), take as directed until finished.   If the infection is on the arm or leg, keep the limb elevated as able.   Use a warm cloth several times per day to relieve pain and encourage healing.   See your caregiver for recheck of the infected site as directed if problems arise.   Only take over-the-counter or prescription medicines for pain, discomfort, or fever as directed by your caregiver.  SEEK MEDICAL CARE IF:   The area of redness (inflammation) is spreading, there are red streaks coming from the infected site, or if a part of the infection begins to turn dark in color.   The joint or bone underneath the infected skin becomes painful after the skin has healed.   The infection returns in the same or another area after it seems to have gone away.   A boil or bump swells up. This may be an abscess.   New, unexplained problems such as pain or fever develop.  SEEK IMMEDIATE MEDICAL CARE IF:   You have a fever.   You or your child feels drowsy or lethargic.   There is vomiting, diarrhea, or lasting discomfort or feeling ill (malaise) with muscle aches and pains.  MAKE SURE YOU:   Understand these instructions.   Will watch your condition.   Will get help right away if you are not doing well or get worse.  Document Released: 08/13/2005 Document Revised: 10/23/2011 Document Reviewed: 06/21/2008 [facility] Patient Information 2012 [place], [place].

## 2012-08-08 NOTE — Progress Notes (Signed)
71 yo man with 36 hour h/o erythema, swelling and warmth of the entire right foot with only mild discomfort (from the swelling).  He had a trimalleolar fx ORIF in 2004 on that ankle.  No h/o trauma  Objective:  NAD Entire right foot is warm, swollen and very red up to the lower aspect of the ankle.  There are several scattered erythematous papules on the anterior lower shin as well.  Pulse is present in the DP. Results for orders placed in visit on 08/08/12  POCT CBC      Component Value Range   WBC 6.0  4.6 - 10.2 K/uL   Lymph, poc 1.4  0.6 - 3.4   POC LYMPH PERCENT 22.7  10 - 50 %L   MID (cbc) 0.8  0 - 0.9   POC MID % 12.7 (*) 0 - 12 %M   POC Granulocyte 3.9  2 - 6.9   Granulocyte percent 64.6  37 - 80 %G   RBC 4.71  4.69 - 6.13 M/uL   Hemoglobin 14.6  14.1 - 18.1 g/dL   HCT, POC 69.6  29.5 - 53.7 %   MCV 98.7 (*) 80 - 97 fL   MCH, POC 31.0  27 - 31.2 pg   MCHC 31.4 (*) 31.8 - 35.4 g/dL   RDW, POC 28.4     Platelet Count, POC 207  142 - 424 K/uL   MPV 9.0  0 - 99.8 fL     Assessment:  Acute cellulitis, right foot  Plan:   1. Cellulitis of foot, right  POCT CBC, cefTRIAXone (ROCEPHIN) injection 1 g, doxycycline (VIBRA-TABS) 100 MG tablet   Follow up 24 hours

## 2012-08-09 ENCOUNTER — Ambulatory Visit (INDEPENDENT_AMBULATORY_CARE_PROVIDER_SITE_OTHER): Payer: Medicare Other | Admitting: Family Medicine

## 2012-08-09 VITALS — BP 128/60 | HR 85 | Temp 97.9°F | Resp 16 | Ht 71.0 in | Wt 171.4 lb

## 2012-08-09 DIAGNOSIS — L0291 Cutaneous abscess, unspecified: Secondary | ICD-10-CM

## 2012-08-09 DIAGNOSIS — L0390 Cellulitis, unspecified: Secondary | ICD-10-CM

## 2012-08-09 MED ORDER — DEXTROSE 5 % IV SOLN: 1.0000 g | Freq: Once | INTRAVENOUS | Status: DC

## 2012-08-09 MED ORDER — CEFTRIAXONE SODIUM 1 G IJ SOLR: 1.0000 g | INTRAMUSCULAR | Status: DC

## 2012-08-09 MED ORDER — CEFTRIAXONE SODIUM 1 G IJ SOLR
1.0000 g | Freq: Once | INTRAMUSCULAR | Status: AC
  Administered 2012-08-09: 1 g via INTRAMUSCULAR
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
## 2012-08-10 ENCOUNTER — Telehealth: Payer: Self-pay

## 2012-08-10 ENCOUNTER — Encounter: Payer: Self-pay | Admitting: Family Medicine

## 2012-08-10 ENCOUNTER — Ambulatory Visit (INDEPENDENT_AMBULATORY_CARE_PROVIDER_SITE_OTHER): Payer: Medicare Other | Admitting: Family Medicine

## 2012-08-10 VITALS — BP 120/76 | HR 76 | Temp 98.2°F | Resp 16 | Ht 72.0 in | Wt 170.6 lb

## 2012-08-10 DIAGNOSIS — L0390 Cellulitis, unspecified: Secondary | ICD-10-CM

## 2012-08-10 DIAGNOSIS — L02419 Cutaneous abscess of limb, unspecified: Secondary | ICD-10-CM

## 2012-08-10 NOTE — Progress Notes (Signed)
71 yo man with cellulitis right foot for last week (onset Friday 08/06/12).  He has had two Rocephin shots and has started doxycycline.  His foot and calf erythema are at least 50% better decreased swelling as well  Objective:  NAD Definite improvement: swelling still present with markedly decreased erythema on foot and posterior calf burn  Assessment:  Improved  Plan:  Continue the current doxycycline Call if any increase in swelling or redness.

## 2012-08-10 NOTE — Telephone Encounter (Signed)
PATIENT STATES HE WAS IN THE OFFICE TO SEE DR. Milus Glazier TODAY FOR CELLULITIS OF HIS (R) FOOT. HE WAS GIVEN THE VISIT SUMMARY AND HE WOULD LIKE TO KNOW WHAT (POC) MEANS? BEST PHONE [phone] (HOME) MBC

## 2012-08-10 NOTE — Patient Instructions (Signed)
70 yo man with cellulitis right foot for last week (onset Friday 08/06/12).  He has had two Rocephin shots and has started doxycycline.  His foot and calf erythema are at least 50% better decreased swelling as well  Objective:  NAD Definite improvement: swelling still present with markedly decreased erythema on foot and posterior calf burn  Assessment:  Improved  Plan:  Continue the current doxycycline Call if any increase in swelling or redness. 

## 2012-08-11 NOTE — Telephone Encounter (Signed)
Called pt, and explained what POC meant. Pt understood

## 2012-09-06 ENCOUNTER — Telehealth: Payer: Self-pay | Admitting: *Deleted

## 2012-09-06 NOTE — Telephone Encounter (Signed)
Dr. Patty Sermons spoke with Dr Juleen China regarding this patient.  Patient has new onset A Fib, Dr Juleen China will start Xarelto or Pradaxa. Patient to call office for follow up

## 2012-09-08 ENCOUNTER — Ambulatory Visit (INDEPENDENT_AMBULATORY_CARE_PROVIDER_SITE_OTHER): Payer: Medicare Other | Admitting: Cardiology

## 2012-09-08 ENCOUNTER — Encounter: Payer: Self-pay | Admitting: Cardiology

## 2012-09-08 VITALS — BP 130/60 | HR 79 | Ht 72.0 in | Wt 172.1 lb

## 2012-09-08 DIAGNOSIS — I10 Essential (primary) hypertension: Secondary | ICD-10-CM

## 2012-09-08 DIAGNOSIS — I4891 Unspecified atrial fibrillation: Secondary | ICD-10-CM

## 2012-09-08 DIAGNOSIS — I480 Paroxysmal atrial fibrillation: Secondary | ICD-10-CM

## 2012-09-08 NOTE — Assessment & Plan Note (Signed)
Blood pressure is satisfactory on current regimen

## 2012-09-08 NOTE — Assessment & Plan Note (Signed)
The patient has had no symptoms of TIA or stroke.  He is already avoiding caffeine and alcohol.  His rate is well controlled when he is in atrial fibrillation.  EKG today demonstrates that he is now back in normal sinus rhythm again.

## 2012-09-08 NOTE — Progress Notes (Signed)
Curtis Dunn Date of Birth:  1941/04/19 [facility] [address] [place], [place]  [address] [phone]         Fax   [phone]  History of Present Illness: This pleasant 71 year old gentleman is seen at the request of Dr. Juleen China because of documented atrial fibrillation.  The patient has a past history of essential hypertension.  He previously had complained of palpitations but on his previous office visits with [place] we were unable to document any significant arrhythmia.  He does not have any history of ischemic heart disease.  He had a normal nuclear treadmill stress test in 2006 and at that time showed no evidence of ischemia and he had an ejection fraction of 57%.  Last week he went in for a routine visit with his primary care provider and it was noted that he had an irregular pulse.  EKG was done which confirmed that he was in atrial fibrillation with a ventricular response 91.  The patient was placed on Pradaxa 150 mg twice a day for long-term anticoagulation for his atrial fibrillation.  The patient has not been expressing any symptoms of chest pain or shortness of breath.  He goes to the gym on a regular basis for working out.  He himself is not aware of his heartbeat unless he checks it with the machine at the gym which demonstrates that his pulse is irregular  Current Outpatient Prescriptions  Medication Sig Dispense Refill  . amLODipine (NORVASC) 10 MG tablet Take 10 mg by mouth daily.      . Ascorbic Acid (VITAMIN C) 1000 MG tablet Take 1,000 mg by mouth daily.      . Chelated Magnesium 100 MG TABS Take by mouth daily.      . Cholecalciferol 1000 UNITS capsule Take 1,000 Units by mouth daily.      . Cyanocobalamin (VITAMIN B 12 PO) Take by mouth. Taking 1000 daily       . dabigatran (PRADAXA) 150 MG CAPS Take 150 mg by mouth every 12 (twelve) hours.      . Docosahexaenoic Acid (DHA OMEGA 3 PO) Take by mouth daily.      . fluticasone (VERAMYST) 27.5  MCG/SPRAY nasal spray Place 2 sprays into the nose daily.      . furosemide (LASIX) 10 MG/ML solution Take 20 mg by mouth daily. Taking every other day      . hydrochlorothiazide (HYDRODIURIL) 25 MG tablet Take 25 mg by mouth daily.      . insulin glargine (LANTUS) 100 UNIT/ML injection Inject into the skin at bedtime. Taking 22 units daily      . insulin lispro (HUMALOG) 100 UNIT/ML injection Inject into the skin 3 (three) times daily before meals. Taking 14 am 4 noon and 8 pm      . lovastatin (MEVACOR) 20 MG tablet Take 20 mg by mouth at bedtime.      . Potassium 99 MG TABS Take by mouth.      . ramipril (ALTACE) 10 MG tablet Take 10 mg by mouth daily.      . Tamsulosin HCl (FLOMAX) 0.4 MG CAPS Take by mouth daily.      . vitamin E 400 UNIT capsule Take 400 Units by mouth daily.      . Zinc 30 MG CAPS Take by mouth daily.      [name] zolpidem (AMBIEN) 10 MG tablet Take 10 mg by mouth at bedtime as needed.  No Known Allergies  Patient Active Problem List  Diagnosis  . MALIGNANT NEOPLASM OF CECUM  . COLONIC POLYPS  . DIABETES MELLITUS, TYPE I  . HYPERTENSION  . Palpitations  . PAF (paroxysmal atrial fibrillation)    History  Smoking status  . Former Smoker  Smokeless tobacco  . Not on file    History  Alcohol Use: Not on file    Family History  Problem Relation Age of Onset  . Heart disease Father     Review of Systems: Constitutional: no fever chills diaphoresis or fatigue or change in weight.  Head and neck: no hearing loss, no epistaxis, no photophobia or visual disturbance. Respiratory: No cough, shortness of breath or wheezing. Cardiovascular: No chest pain peripheral edema, palpitations. Gastrointestinal: No abdominal distention, no abdominal pain, no change in bowel habits hematochezia or melena. Genitourinary: No dysuria, no frequency, no urgency, no nocturia. Musculoskeletal:No arthralgias, no back pain, no gait disturbance or myalgias. Neurological: No  dizziness, no headaches, no numbness, no seizures, no syncope, no weakness, no tremors. Hematologic: No lymphadenopathy, no easy bruising. Psychiatric: No confusion, no hallucinations, no sleep disturbance.    Physical Exam: Filed Vitals:   09/08/12 1045  BP: 130/60  Pulse: 79   the general appearance reveals a well-developed well-nourished somewhat anxious gentleman in no acute distress.The head and neck exam reveals pupils equal and reactive.  Extraocular movements are full.  There is no scleral icterus.  The mouth and pharynx are normal.  The neck is supple.  The carotids reveal no bruits.  The jugular venous pressure is normal.  The  thyroid is not enlarged.  There is no lymphadenopathy.  The chest is clear to percussion and auscultation.  There are no rales or rhonchi.  Expansion of the chest is symmetrical.  The precordium is quiet.  The first heart sound is normal.  The second heart sound is physiologically split.  There is no murmur gallop rub or click.  There is no abnormal lift or heave.  The abdomen is soft and nontender.  The bowel sounds are normal.  The liver and spleen are not enlarged.  There are no abdominal masses.  There are no abdominal bruits.  Extremities reveal good pedal pulses.  There is no phlebitis or edema.  There is no cyanosis or clubbing.  Strength is normal and symmetrical in all extremities.  There is no lateralizing weakness.  There are no sensory deficits.  The skin is warm and dry.  There is no rash.  EKG today shows normal sinus rhythm and a left axis deviation and no ischemic changes   Assessment / Plan: The patient is to continue current medication.  We will obtain a two-dimensional echocardiogram and then consider adding a small dose of calcium channel blocker or beta blocker to his regimen to try to push him into normal sinus rhythm more often and atrial fibrillation less often.  He will need to be on long-term anticoagulation whether he is in sinus rhythm or  atrial fibrillation.  Recheck here in one month for office visit and followup EKG

## 2012-09-08 NOTE — Patient Instructions (Addendum)
Your physician has requested that you have an echocardiogram. Echocardiography is a painless test that uses sound waves to create images of your heart. It provides your doctor with information about the size and shape of your heart and how well your heart's chambers and valves are working. This procedure takes approximately one hour. There are no restrictions for this procedure.  Your physician recommends that you continue on your current medications as directed. Please refer to the Current Medication list given to you today.  

## 2012-09-09 ENCOUNTER — Telehealth: Payer: Self-pay | Admitting: *Deleted

## 2012-09-09 ENCOUNTER — Ambulatory Visit (HOSPITAL_COMMUNITY): Payer: Medicare Other | Attending: Cardiovascular Disease | Admitting: Radiology

## 2012-09-09 DIAGNOSIS — E119 Type 2 diabetes mellitus without complications: Secondary | ICD-10-CM | POA: Insufficient documentation

## 2012-09-09 DIAGNOSIS — I480 Paroxysmal atrial fibrillation: Secondary | ICD-10-CM

## 2012-09-09 DIAGNOSIS — I369 Nonrheumatic tricuspid valve disorder, unspecified: Secondary | ICD-10-CM | POA: Insufficient documentation

## 2012-09-09 DIAGNOSIS — I4891 Unspecified atrial fibrillation: Secondary | ICD-10-CM | POA: Insufficient documentation

## 2012-09-09 DIAGNOSIS — I10 Essential (primary) hypertension: Secondary | ICD-10-CM | POA: Insufficient documentation

## 2012-09-09 MED ORDER — METOPROLOL SUCCINATE ER 25 MG PO TB24: 25.0000 mg | ORAL_TABLET | Freq: Every day | ORAL | Status: DC

## 2012-09-09 NOTE — Telephone Encounter (Signed)
Message copied by Burnell Blanks on Thu Sep 09, 2012  4:04 PM ------      Message from: Cassell Clement      Created: Thu Sep 09, 2012 12:51 PM       Please report.  The echocardiogram looks good.  The left ventricular ejection fraction is normal at 60-65%.  Valve function is good.  Start Toprol-XL 25 one daily to help prevent future episodes of atrial fib.

## 2012-09-09 NOTE — Progress Notes (Signed)
Echocardiogram performed.  

## 2012-09-09 NOTE — Telephone Encounter (Signed)
Advised patient and sent Rx to pharmacy. 

## 2012-09-16 ENCOUNTER — Encounter: Payer: Self-pay | Admitting: Cardiology

## 2012-10-07 ENCOUNTER — Ambulatory Visit (INDEPENDENT_AMBULATORY_CARE_PROVIDER_SITE_OTHER): Payer: Medicare Other | Admitting: Cardiology

## 2012-10-07 ENCOUNTER — Encounter: Payer: Self-pay | Admitting: Cardiology

## 2012-10-07 VITALS — BP 142/74 | HR 55 | Resp 18 | Wt 172.8 lb

## 2012-10-07 DIAGNOSIS — I480 Paroxysmal atrial fibrillation: Secondary | ICD-10-CM

## 2012-10-07 DIAGNOSIS — I10 Essential (primary) hypertension: Secondary | ICD-10-CM

## 2012-10-07 DIAGNOSIS — I4891 Unspecified atrial fibrillation: Secondary | ICD-10-CM

## 2012-10-07 DIAGNOSIS — R002 Palpitations: Secondary | ICD-10-CM

## 2012-10-07 MED ORDER — METOPROLOL SUCCINATE ER 25 MG PO TB24: 25.0000 mg | ORAL_TABLET | Freq: Every day | ORAL | Status: DC

## 2012-10-07 NOTE — Progress Notes (Signed)
Curtis Dunn Date of Birth:  [birth year] [facility] [address] [place], [place]  [address] [phone]         Fax   [phone]  History of Present Illness: This pleasant 71 year old gentleman who was recently seen at the request of Dr. Juleen China because of documented atrial fibrillation. The patient has a past history of essential hypertension. He previously had complained of palpitations but on his previous office visits with [place] we were unable to document any significant arrhythmia. He does not have any history of ischemic heart disease. He had a normal nuclear treadmill stress test in 2006 and at that time showed no evidence of ischemia and he had an ejection fraction of 57%.  Recently he went in for a routine visit with his primary care provider and it was noted that he had an irregular pulse. EKG was done which confirmed that he was in atrial fibrillation with a ventricular response 91. The patient was placed on Pradaxa 150 mg twice a day for long-term anticoagulation for his atrial fibrillation. The patient has not been expressing any symptoms of chest pain or shortness of breath. He goes to the gym on a regular basis for working out. He himself is not aware of his heartbeat unless he checks it with the machine at the gym which demonstrates that his pulse is irregular.  Since going on the anticoagulation he has not been experiencing any excessive bruising or bleeding.  The patient had an echocardiogram 09/09/12 showing ejection fraction 60-65% with grade 1 diastolic dysfunction and a pulmonary artery pressure of 35 and no significant valve abnormalities.   Current Outpatient Prescriptions  Medication Sig Dispense Refill  . amLODipine (NORVASC) 10 MG tablet Take 10 mg by mouth daily.      . Ascorbic Acid (VITAMIN C) 1000 MG tablet Take 1,000 mg by mouth daily.      . Chelated Magnesium 100 MG TABS Take by mouth daily.      . Cholecalciferol 1000 UNITS capsule  Take 1,000 Units by mouth daily.      . Cyanocobalamin (VITAMIN B 12 PO) Take by mouth. Taking 1000 daily       . dabigatran (PRADAXA) 150 MG CAPS Take 150 mg by mouth every 12 (twelve) hours.      . Docosahexaenoic Acid (DHA OMEGA 3 PO) Take by mouth daily.      . fluticasone (VERAMYST) 27.5 MCG/SPRAY nasal spray Place 2 sprays into the nose daily.      . furosemide (LASIX) 10 MG/ML solution Take 20 mg by mouth daily. Taking every other day      . hydrochlorothiazide (HYDRODIURIL) 25 MG tablet Take 25 mg by mouth daily.      . insulin glargine (LANTUS) 100 UNIT/ML injection Inject into the skin at bedtime. Taking 22 units daily      . insulin lispro (HUMALOG) 100 UNIT/ML injection Inject into the skin 3 (three) times daily before meals. Taking 14units  am 3 noon and 7 units at  8 pm      . lovastatin (MEVACOR) 20 MG tablet Take 20 mg by mouth at bedtime.      . metoprolol succinate (TOPROL XL) 25 MG 24 hr tablet Take 1 tablet (25 mg total) by mouth daily.  90 tablet  3  . Potassium 99 MG TABS Take by mouth.      . ramipril (ALTACE) 10 MG tablet Take 10 mg by mouth daily.      [name]  Tamsulosin HCl (FLOMAX) 0.4 MG CAPS Take by mouth daily.      . vitamin E 400 UNIT capsule Take 400 Units by mouth daily.      . Zinc 30 MG CAPS Take by mouth daily.      [name] zolpidem (AMBIEN) 10 MG tablet Take 10 mg by mouth at bedtime as needed.      . [DISCONTINUED] metoprolol succinate (TOPROL XL) 25 MG 24 hr tablet Take 1 tablet (25 mg total) by mouth daily.  30 tablet  11    No Known Allergies  Patient Active Problem List  Diagnosis  . MALIGNANT NEOPLASM OF CECUM  . COLONIC POLYPS  . DIABETES MELLITUS, TYPE I  . HYPERTENSION  . Palpitations  . PAF (paroxysmal atrial fibrillation)    History  Smoking status  . Former Smoker  Smokeless tobacco  . Not on file    History  Alcohol Use: Not on file    Family History  Problem Relation Age of Onset  . Heart disease Father     Review of  Systems: Constitutional: no fever chills diaphoresis or fatigue or change in weight.  Head and neck: no hearing loss, no epistaxis, no photophobia or visual disturbance. Respiratory: No cough, shortness of breath or wheezing. Cardiovascular: No chest pain peripheral edema, palpitations. Gastrointestinal: No abdominal distention, no abdominal pain, no change in bowel habits hematochezia or melena. Genitourinary: No dysuria, no frequency, no urgency, no nocturia. Musculoskeletal:No arthralgias, no back pain, no gait disturbance or myalgias. Neurological: No dizziness, no headaches, no numbness, no seizures, no syncope, no weakness, no tremors. Hematologic: No lymphadenopathy, no easy bruising. Psychiatric: No confusion, no hallucinations, no sleep disturbance.    Physical Exam: Filed Vitals:   10/07/12 1020  BP: 142/74  Pulse: 55  Resp: 18   the general appearance reveals a well-developed well-nourished gentleman in no distress.The head and neck exam reveals pupils equal and reactive.  Extraocular movements are full.  There is no scleral icterus.  The mouth and pharynx are normal.  The neck is supple.  The carotids reveal no bruits.  The jugular venous pressure is normal.  The  thyroid is not enlarged.  There is no lymphadenopathy.  The chest is clear to percussion and auscultation.  There are no rales or rhonchi.  Expansion of the chest is symmetrical.  The precordium is quiet.  The first heart sound is normal.  The second heart sound is physiologically split.  There is no murmur gallop rub or click.  There is no abnormal lift or heave.  The abdomen is soft and nontender.  The bowel sounds are normal.  The liver and spleen are not enlarged.  There are no abdominal masses.  There are no abdominal bruits.  Extremities reveal good pedal pulses.  There is no phlebitis or edema.  There is no cyanosis or clubbing.  Strength is normal and symmetrical in all extremities.  There is no lateralizing  weakness.  There are no sensory deficits.  The skin is warm and dry.  There is no rash.  EKG today shows sinus bradycardia at 55 per minute and a pattern of an incomplete right bundle branch block.   Assessment / Plan: The patient is to continue same medication.  He is tolerating Pradaxa and is being enrolled in the orbit atrial fib 2 trial. Recheck in 3 months for followup office visit and EKG.

## 2012-10-07 NOTE — Assessment & Plan Note (Signed)
The patient has a history of paroxysmal atrial fibrillation.  Today he is in normal sinus rhythm.  He has a chadss score of 2 for diabetes and hypertension.  Since we last saw him he had an echocardiogram 09/09/12 showing normal left ventricular systolic function.

## 2012-10-07 NOTE — Assessment & Plan Note (Signed)
The patient has not been aware of any palpitations since last visit.

## 2012-10-07 NOTE — Patient Instructions (Addendum)
Your physician recommends that you continue on your current medications as directed. Please refer to the Current Medication list given to you today.  Your physician recommends that you schedule a follow-up appointment in: 3 month ov and ekg

## 2012-10-07 NOTE — Assessment & Plan Note (Signed)
Blood pressure was remaining stable on current therapy.  He is now on low dose Toprol XL 25 mg one daily for suppression of his atrial fib.

## 2012-10-11 ENCOUNTER — Telehealth: Payer: Self-pay | Admitting: Cardiology

## 2012-10-11 DIAGNOSIS — I4891 Unspecified atrial fibrillation: Secondary | ICD-10-CM

## 2012-10-11 MED ORDER — METOPROLOL SUCCINATE ER 25 MG PO TB24: 25.0000 mg | ORAL_TABLET | Freq: Every day | ORAL | Status: DC

## 2012-10-11 NOTE — Telephone Encounter (Signed)
Left message to call back  

## 2012-10-11 NOTE — Telephone Encounter (Signed)
Spoke with patient and sent Rx to [facility], called [facility] and cancelled Rx there.

## 2012-10-11 NOTE — Telephone Encounter (Signed)
Pt went to [facility] over the weekend and his metoprolol was not there so he wanted to check on it see if you sent to the [facility]

## 2012-10-11 NOTE — Telephone Encounter (Signed)
New Problem: ° ° ° °Patient returned your call.  Please call back. °

## 2012-10-28 ENCOUNTER — Encounter: Payer: Self-pay | Admitting: Cardiology

## 2012-10-28 ENCOUNTER — Other Ambulatory Visit: Payer: Self-pay

## 2012-10-28 DIAGNOSIS — R0989 Other specified symptoms and signs involving the circulatory and respiratory systems: Secondary | ICD-10-CM

## 2012-10-28 NOTE — Progress Notes (Signed)
   I received a phone call today from Leilani Able PA-C. He was seeing the patient with his orthopedic team about some chest discomfort. He felt that it was not musculoskeletal. It does not occur when the patient is exercising. It seems to occur sometime after exercise is complete. Also on physical exam it was felt that his abdominal aorta might be prominent. We are making arrangements for the patient to be seen again by Dr. Patty Sermons. In addition the patient will have an abdominal ultrasound to assess his abdominal aorta based on concerns from today's physical exam as noted.  Jerral Bonito, MD

## 2012-10-29 ENCOUNTER — Encounter (HOSPITAL_COMMUNITY): Payer: Self-pay | Admitting: Family Medicine

## 2012-11-01 ENCOUNTER — Encounter (INDEPENDENT_AMBULATORY_CARE_PROVIDER_SITE_OTHER): Payer: Medicare Other

## 2012-11-01 DIAGNOSIS — I10 Essential (primary) hypertension: Secondary | ICD-10-CM

## 2012-11-01 DIAGNOSIS — R0989 Other specified symptoms and signs involving the circulatory and respiratory systems: Secondary | ICD-10-CM

## 2012-11-03 ENCOUNTER — Encounter: Payer: Self-pay | Admitting: Cardiology

## 2012-11-03 ENCOUNTER — Ambulatory Visit (INDEPENDENT_AMBULATORY_CARE_PROVIDER_SITE_OTHER): Payer: Medicare Other | Admitting: Cardiology

## 2012-11-03 VITALS — BP 142/78 | HR 56 | Resp 18 | Ht 72.0 in | Wt 170.0 lb

## 2012-11-03 DIAGNOSIS — R079 Chest pain, unspecified: Secondary | ICD-10-CM

## 2012-11-03 DIAGNOSIS — I480 Paroxysmal atrial fibrillation: Secondary | ICD-10-CM

## 2012-11-03 DIAGNOSIS — I4891 Unspecified atrial fibrillation: Secondary | ICD-10-CM

## 2012-11-03 NOTE — Assessment & Plan Note (Signed)
The patient has had no recurrence of his paroxysmal atrial fibrillation.  He remains on Pradaxa without complications or side effects

## 2012-11-03 NOTE — Progress Notes (Signed)
Curtis Dunn Date of Birth:  12-21-1940 [facility] [address] [place], [place]  [address] [phone]  Fax   [phone]  HPI: This pleasant 71 year old gentleman who was recently seen at the request of Dr. Juleen China because of documented atrial fibrillation. The patient has a past history of essential hypertension. He previously had complained of palpitations but on his previous office visits with [place] we were unable to document any significant arrhythmia. He does not have any history of ischemic heart disease. He had a normal nuclear treadmill stress test in 2006 and at that time showed no evidence of ischemia and he had an ejection fraction of 57%. Recently he went in for a routine visit with his primary care provider and it was noted that he had an irregular pulse. EKG was done which confirmed that he was in atrial fibrillation with a ventricular response 91. The patient was placed on Pradaxa 150 mg twice a day for long-term anticoagulation for his atrial fibrillation. The patient has not been expressing any symptoms of chest pain or shortness of breath. He goes to the gym on a regular basis for working out. He himself is not aware of his heartbeat unless he checks it with the machine at the gym which demonstrates that his pulse is irregular. Since going on the anticoagulation he has not been experiencing any excessive bruising or bleeding. The patient had an echocardiogram 09/09/12 showing ejection fraction 60-65% with grade 1 diastolic dysfunction and a pulmonary artery pressure of 35 and no significant valve abnormalities. Last week he developed some substernal chest discomfort after exercise.  He had a previous bad cough.  He was concerned that he might be having some problems with his chest wall and he saw his orthopedist who raised the question of possible abdominal aortic aneurysm based upon physical findings.  That reason the patient underwent an ultrasound of  abdominal aorta yesterday which showed normal caliber abdominal aorta with no evidence of stenosis or dilatation or aneurysm.  Current Outpatient Prescriptions  Medication Sig Dispense Refill  . amLODipine (NORVASC) 10 MG tablet Take 10 mg by mouth daily.      . Ascorbic Acid (VITAMIN C) 1000 MG tablet Take 1,000 mg by mouth daily.      . Chelated Magnesium 100 MG TABS Take by mouth daily.      . Cholecalciferol 1000 UNITS capsule Take 1,000 Units by mouth daily.      . Cyanocobalamin (VITAMIN B 12 PO) Take by mouth. Taking 1000 daily       . dabigatran (PRADAXA) 150 MG CAPS Take 150 mg by mouth every 12 (twelve) hours.      . Docosahexaenoic Acid (DHA OMEGA 3 PO) Take by mouth daily.      . fluticasone (VERAMYST) 27.5 MCG/SPRAY nasal spray Place 2 sprays into the nose daily.      . furosemide (LASIX) 10 MG/ML solution Take 20 mg by mouth daily. Taking every other day      . guaiFENesin (MUCINEX) 600 MG 12 hr tablet Take 1,200 mg by mouth 2 (two) times daily as needed.      . hydrochlorothiazide (HYDRODIURIL) 25 MG tablet Take 25 mg by mouth daily.      . insulin glargine (LANTUS) 100 UNIT/ML injection Inject into the skin at bedtime. Taking 22 units daily      . insulin lispro (HUMALOG) 100 UNIT/ML injection Inject into the skin 3 (three) times daily before meals. Taking 15units  Am; 4  UNITS  noon and 8 UNITS units at  8 pm      . lovastatin (MEVACOR) 20 MG tablet Take 20 mg by mouth at bedtime.      . metoprolol succinate (TOPROL XL) 25 MG 24 hr tablet Take 1 tablet (25 mg total) by mouth daily.  90 tablet  3  . Potassium 99 MG TABS Take by mouth.      . ramipril (ALTACE) 10 MG tablet Take 10 mg by mouth daily.      . Tamsulosin HCl (FLOMAX) 0.4 MG CAPS Take by mouth daily.      . vitamin E 400 UNIT capsule Take 400 Units by mouth daily.      . Zinc 30 MG CAPS Take by mouth daily.      [name] zolpidem (AMBIEN) 10 MG tablet Take 10 mg by mouth at bedtime as needed.        No Known  Allergies  Patient Active Problem List  Diagnosis  . MALIGNANT NEOPLASM OF CECUM  . COLONIC POLYPS  . DIABETES MELLITUS, TYPE I  . HYPERTENSION  . Palpitations  . PAF (paroxysmal atrial fibrillation)    History  Smoking status  . Former Smoker  Smokeless tobacco  . Not on file    History  Alcohol Use: Not on file    Family History  Problem Relation Age of Onset  . Heart disease Father     Review of Systems: The patient denies any heat or cold intolerance.  No weight gain or weight loss.  The patient denies headaches or blurry vision.  There is no cough or sputum production.  The patient denies dizziness.  There is no hematuria or hematochezia.  The patient denies any muscle aches or arthritis.  The patient denies any rash.  The patient denies frequent falling or instability.  There is no history of depression or anxiety.  All other systems were reviewed and are negative.   Physical Exam: Filed Vitals:   11/03/12 1112  BP: 142/78  Pulse: 56  Resp: 18   general appearance reveals a well-developed well-nourished gentleman in no distress.The head and neck exam reveals pupils equal and reactive.  Extraocular movements are full.  There is no scleral icterus.  The mouth and pharynx are normal.  The neck is supple.  The carotids reveal no bruits.  The jugular venous pressure is normal.  The  thyroid is not enlarged.  There is no lymphadenopathy.  The chest is clear to percussion and auscultation.  There are no rales or rhonchi.  Expansion of the chest is symmetrical.  The precordium is quiet.  The first heart sound is normal.  The second heart sound is physiologically split.  There is no murmur gallop rub or click.  There is no abnormal lift or heave.  The abdomen is soft and nontender.  The bowel sounds are normal.  The liver and spleen are not enlarged.  There are no abdominal masses.  There are no abdominal bruits.  Extremities reveal good pedal pulses.  There is no phlebitis or  edema.  There is no cyanosis or clubbing.  Strength is normal and symmetrical in all extremities.  There is no lateralizing weakness.  There are no sensory deficits.  The skin is warm and dry.  There is no rash.     Assessment / Plan: We reviewed yesterday's ultrasound results with him.  He is relieved.  He will continue same medication and be rechecked at his previously scheduled visit.

## 2012-11-03 NOTE — Patient Instructions (Addendum)
Try some over the counter Mucinex twice a day as needed for cough  Continue all other medications as directed  Keep your scheduled appointment

## 2012-12-10 ENCOUNTER — Other Ambulatory Visit: Payer: Self-pay

## 2012-12-10 MED ORDER — DABIGATRAN ETEXILATE MESYLATE 150 MG PO CAPS: 150.0000 mg | ORAL_CAPSULE | Freq: Two times a day (BID) | ORAL | Status: DC

## 2012-12-13 ENCOUNTER — Other Ambulatory Visit: Payer: Self-pay

## 2012-12-13 ENCOUNTER — Telehealth: Payer: Self-pay | Admitting: Cardiology

## 2012-12-13 DIAGNOSIS — I4891 Unspecified atrial fibrillation: Secondary | ICD-10-CM

## 2012-12-13 MED ORDER — DABIGATRAN ETEXILATE MESYLATE 150 MG PO CAPS: 150.0000 mg | ORAL_CAPSULE | Freq: Two times a day (BID) | ORAL | Status: DC

## 2012-12-13 NOTE — Telephone Encounter (Signed)
Left message to call back  

## 2012-12-13 NOTE — Telephone Encounter (Signed)
Pt having problem with getting med, took last pill today, pls call asap [phone]

## 2012-12-13 NOTE — Telephone Encounter (Signed)
Rx sent to pharmacy   

## 2013-01-01 ENCOUNTER — Other Ambulatory Visit: Payer: Self-pay

## 2013-01-06 ENCOUNTER — Encounter: Payer: Self-pay | Admitting: Cardiology

## 2013-01-06 ENCOUNTER — Ambulatory Visit (INDEPENDENT_AMBULATORY_CARE_PROVIDER_SITE_OTHER): Payer: Medicare Other | Admitting: Cardiology

## 2013-01-06 VITALS — BP 130/66 | HR 58 | Ht 72.0 in | Wt 173.0 lb

## 2013-01-06 DIAGNOSIS — I4891 Unspecified atrial fibrillation: Secondary | ICD-10-CM

## 2013-01-06 DIAGNOSIS — I480 Paroxysmal atrial fibrillation: Secondary | ICD-10-CM

## 2013-01-06 DIAGNOSIS — I10 Essential (primary) hypertension: Secondary | ICD-10-CM

## 2013-01-06 NOTE — Patient Instructions (Addendum)
Your physician recommends that you continue on your current medications as directed. Please refer to the Current Medication list given to you today.  Your physician recommends that you schedule a follow-up appointment in: 3 month ov 

## 2013-01-06 NOTE — Assessment & Plan Note (Signed)
Blood pressure was remaining stable on current medication.  No headaches or dizziness or syncope.  No symptoms of CHF.  He works out regularly at [employer] without difficulty.

## 2013-01-06 NOTE — Progress Notes (Signed)
Curtis Dunn Date of Birth:  1941/11/08 [facility] [address] [place], [place]  [address] [phone]  Fax   [phone]  HPI: This pleasant 72 year old gentleman who was recently seen at the request of Dr. Juleen China because of documented atrial fibrillation. The patient has a past history of essential hypertension. He previously had complained of palpitations but on his previous office visits with [place] we were unable to document any significant arrhythmia. He does not have any history of ischemic heart disease. He had a normal nuclear treadmill stress test in 2006 and at that time showed no evidence of ischemia and he had an ejection fraction of 57%. Recently he went in for a routine visit with his primary care provider and it was noted that he had an irregular pulse. EKG was done which confirmed that he was in atrial fibrillation with a ventricular response 91. The patient was placed on Pradaxa 150 mg twice a day for long-term anticoagulation for his atrial fibrillation. The patient has not been expressing any symptoms of chest pain or shortness of breath. He goes to the gym on a regular basis for working out. He himself is not aware of his heartbeat unless he checks it with the machine at the gym which demonstrates that his pulse is irregular. Since going on the anticoagulation he has not been experiencing any excessive bruising or bleeding. The patient had an echocardiogram 09/09/12 showing ejection fraction 60-65% with grade 1 diastolic dysfunction and a pulmonary artery pressure of 35 and no significant valve abnormalities.  Last week he developed some substernal chest discomfort after exercise. He had a previous bad cough. He was concerned that he might be having some problems with his chest wall and he saw his orthopedist who raised the question of possible abdominal aortic aneurysm based upon physical findings. That reason the patient underwent an ultrasound of  abdominal aorta yesterday which showed normal caliber abdominal aorta with no evidence of stenosis or dilatation or aneurysm.   Current Outpatient Prescriptions  Medication Sig Dispense Refill  . amLODipine (NORVASC) 10 MG tablet Take 10 mg by mouth daily.      . Ascorbic Acid (VITAMIN C) 1000 MG tablet Take 1,000 mg by mouth daily.      . Chelated Magnesium 100 MG TABS Take by mouth daily.      . Cholecalciferol 1000 UNITS capsule Take 1,000 Units by mouth daily.      . Cyanocobalamin (VITAMIN B 12 PO) Take by mouth. Taking 1000 daily       . dabigatran (PRADAXA) 150 MG CAPS Take 1 capsule (150 mg total) by mouth every 12 (twelve) hours.  60 capsule  6  . Docosahexaenoic Acid (DHA OMEGA 3 PO) Take 600 mg by mouth daily.       . furosemide (LASIX) 20 MG tablet Take 20 mg by mouth daily.      . hydrochlorothiazide (HYDRODIURIL) 25 MG tablet Take 25 mg by mouth daily.      . insulin glargine (LANTUS) 100 UNIT/ML injection Inject into the skin at bedtime. Taking 22 units daily      . insulin lispro (HUMALOG) 100 UNIT/ML injection Inject into the skin 3 (three) times daily before meals. Taking 15units  Am; 4 UNITS  noon and 10 UNITS units at  6 pm      . lovastatin (MEVACOR) 20 MG tablet Take 20 mg by mouth at bedtime.      . metoprolol succinate (TOPROL XL) 25 MG  24 hr tablet Take 1 tablet (25 mg total) by mouth daily.  90 tablet  3  . Potassium 99 MG TABS Take by mouth.      . ramipril (ALTACE) 10 MG tablet Take 10 mg by mouth daily.      . Tamsulosin HCl (FLOMAX) 0.4 MG CAPS Take by mouth daily.      . vitamin E 400 UNIT capsule Take 400 Units by mouth daily.      . Zinc 30 MG CAPS Take by mouth daily.      [name] zolpidem (AMBIEN) 10 MG tablet Take 10 mg by mouth at bedtime as needed.      . fluticasone (VERAMYST) 27.5 MCG/SPRAY nasal spray Place 2 sprays into the nose daily.       No current facility-administered medications for this visit.    No Known Allergies  Patient Active Problem  List  Diagnosis  . MALIGNANT NEOPLASM OF CECUM  . COLONIC POLYPS  . DIABETES MELLITUS, TYPE I  . HYPERTENSION  . Palpitations  . PAF (paroxysmal atrial fibrillation)    History  Smoking status  . Former Smoker  Smokeless tobacco  . Not on file    History  Alcohol Use: Not on file    Family History  Problem Relation Age of Onset  . Heart disease Father     Review of Systems: The patient denies any heat or cold intolerance.  No weight gain or weight loss.  The patient denies headaches or blurry vision.  There is no cough or sputum production.  The patient denies dizziness.  There is no hematuria or hematochezia.  The patient denies any muscle aches or arthritis.  The patient denies any rash.  The patient denies frequent falling or instability.  There is no history of depression or anxiety.  All other systems were reviewed and are negative.   Physical Exam: Filed Vitals:   01/06/13 0918  BP: 130/66  Pulse: 58   the general appearance reveals a well-developed well-nourished gentleman in no distress.The head and neck exam reveals pupils equal and reactive.  Extraocular movements are full.  There is no scleral icterus.  The mouth and pharynx are normal.  The neck is supple.  The carotids reveal no bruits.  The jugular venous pressure is normal.  The  thyroid is not enlarged.  There is no lymphadenopathy.  The chest is clear to percussion and auscultation.  There are no rales or rhonchi.  Expansion of the chest is symmetrical.  The precordium is quiet.  The first heart sound is normal.  The second heart sound is physiologically split.  There is no murmur gallop rub or click.  There is no abnormal lift or heave.  The abdomen is soft and nontender.  The bowel sounds are normal.  The liver and spleen are not enlarged.  There are no abdominal masses.  There are no abdominal bruits.  Extremities reveal good pedal pulses.  There is no phlebitis or edema.  There is no cyanosis or clubbing.   Strength is normal and symmetrical in all extremities.  There is no lateralizing weakness.  There are no sensory deficits.  The skin is warm and dry.  There is no rash.      Assessment / Plan: Continue present medications.  Recheck in 3 months for followup office visit and EKG.

## 2013-01-06 NOTE — Assessment & Plan Note (Signed)
The patient has had no recurrence of his atrial fibrillation that he is aware of.  He presently is on Pradaxa twice a day.  He is expressing interest in possibly switching to Xarelto if it is  less expensive.  He will check with his pharmacist and let us know.

## 2013-01-12 ENCOUNTER — Telehealth: Payer: Self-pay | Admitting: Cardiology

## 2013-01-12 ENCOUNTER — Encounter: Payer: Self-pay | Admitting: Cardiology

## 2013-01-12 DIAGNOSIS — I4891 Unspecified atrial fibrillation: Secondary | ICD-10-CM

## 2013-01-12 MED ORDER — RIVAROXABAN 20 MG PO TABS: 20.0000 mg | ORAL_TABLET | Freq: Every day | ORAL | Status: DC

## 2013-01-12 NOTE — Telephone Encounter (Signed)
New Prob    Pt was told to give you a call today.

## 2013-01-12 NOTE — Telephone Encounter (Signed)
Spoke with  Dr. Patty Sermons and patient would like to change from Pradaxa to Xarelto secondary to the cost.  Samples and Rx will be placed at front for patient pick up.

## 2013-01-12 NOTE — Telephone Encounter (Signed)
Follow-up: ° ° ° °Patient called in returning your call.  Please call back. °

## 2013-01-12 NOTE — Telephone Encounter (Signed)
Left message to call back  

## 2013-04-12 ENCOUNTER — Ambulatory Visit (INDEPENDENT_AMBULATORY_CARE_PROVIDER_SITE_OTHER): Payer: Medicare Other | Admitting: Cardiology

## 2013-04-12 ENCOUNTER — Encounter: Payer: Self-pay | Admitting: Cardiology

## 2013-04-12 VITALS — BP 134/76 | HR 48 | Ht 72.0 in | Wt 176.6 lb

## 2013-04-12 DIAGNOSIS — E109 Type 1 diabetes mellitus without complications: Secondary | ICD-10-CM

## 2013-04-12 DIAGNOSIS — I480 Paroxysmal atrial fibrillation: Secondary | ICD-10-CM

## 2013-04-12 DIAGNOSIS — I10 Essential (primary) hypertension: Secondary | ICD-10-CM

## 2013-04-12 DIAGNOSIS — I4891 Unspecified atrial fibrillation: Secondary | ICD-10-CM

## 2013-04-12 NOTE — Assessment & Plan Note (Signed)
His pulse rate has remained regular.  He has had no recurrent atrial fibrillation.

## 2013-04-12 NOTE — Assessment & Plan Note (Addendum)
The patient has been insulin-dependent diabetic since about 1975.  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

## 2013-04-12 NOTE — Patient Instructions (Addendum)
Your physician recommends that you continue on your current medications as directed. Please refer to the Current Medication list given to you today.  Your physician recommends that you schedule a follow-up appointment in: 3 month ov 

## 2013-04-12 NOTE — Assessment & Plan Note (Signed)
Blood pressures remaining stable on current therapy. 

## 2013-04-12 NOTE — Progress Notes (Signed)
Curtis Dunn Date of Birth:  [birth year] [facility] [address] [place], [place]  [address] [phone]  Fax   [phone]  HPI: This pleasant 72 year old gentleman who was recently seen at the request of Dr. Juleen China because of documented atrial fibrillation. The patient has a past history of essential hypertension. He previously had complained of palpitations but on his previous office visits with [place] we were unable to document any significant arrhythmia. He does not have any history of ischemic heart disease. He had a normal nuclear treadmill stress test in 2006 and at that time showed no evidence of ischemia and he had an ejection fraction of 57%. Recently he went in for a routine visit with his primary care provider and it was noted that he had an irregular pulse. EKG was done which confirmed that he was in atrial fibrillation with a ventricular response 91. The patient was placed on Pradaxa 150 mg twice a day for long-term anticoagulation for his atrial fibrillation.  Subsequently he has been switched to Xarelto. The patient has not been expressing any symptoms of chest pain or shortness of breath. He goes to the gym on a regular basis for working out. He himself is not aware of his heartbeat unless he checks it with the machine at the gym which demonstrates that his pulse is irregular. Since going on the anticoagulation he has not been experiencing any excessive bruising or bleeding. The patient had an echocardiogram 09/09/12 showing ejection fraction 60-65% with grade 1 diastolic dysfunction and a pulmonary artery pressure of 35 and no significant valve abnormalities.  Previously he saw his orthopedist who raised the question of possible abdominal aortic aneurysm based upon physical findings. That reason the patient underwent an ultrasound of abdominal aorta on 01/05/13 which showed normal caliber abdominal aorta with no evidence of stenosis or dilatation or  aneurysm.   Current Outpatient Prescriptions  Medication Sig Dispense Refill  . amLODipine (NORVASC) 10 MG tablet Take 10 mg by mouth daily.      . Ascorbic Acid (VITAMIN C) 1000 MG tablet Take 1,000 mg by mouth daily.      . Chelated Magnesium 100 MG TABS Take by mouth daily.      . Cholecalciferol 1000 UNITS capsule Take 1,000 Units by mouth daily.      . Cyanocobalamin (VITAMIN B 12 PO) Take by mouth. Taking 1000 daily       . Docosahexaenoic Acid (DHA OMEGA 3 PO) Take 600 mg by mouth daily.       . fluticasone (VERAMYST) 27.5 MCG/SPRAY nasal spray Place 2 sprays into the nose daily.      . furosemide (LASIX) 20 MG tablet Take 20 mg by mouth daily.      . hydrochlorothiazide (HYDRODIURIL) 25 MG tablet Take 25 mg by mouth daily.      . insulin glargine (LANTUS) 100 UNIT/ML injection Inject into the skin at bedtime. Taking 22 units daily      . insulin lispro (HUMALOG) 100 UNIT/ML injection Inject into the skin 3 (three) times daily before meals. Taking 15units  Am; 4 UNITS  noon and 10 UNITS units at  6 pm      . lovastatin (MEVACOR) 20 MG tablet Take 20 mg by mouth at bedtime.      . metoprolol succinate (TOPROL XL) 25 MG 24 hr tablet Take 1 tablet (25 mg total) by mouth daily.  90 tablet  3  . Potassium 99 MG TABS Take by mouth.      [name]  ramipril (ALTACE) 10 MG tablet Take 10 mg by mouth daily.      . Rivaroxaban (XARELTO) 20 MG TABS Take 1 tablet (20 mg total) by mouth daily.  30 tablet  11  . Tamsulosin HCl (FLOMAX) 0.4 MG CAPS Take by mouth daily.      . vitamin E 400 UNIT capsule Take 400 Units by mouth daily.      . Zinc 30 MG CAPS Take by mouth daily.      [name] zolpidem (AMBIEN) 10 MG tablet Take 10 mg by mouth at bedtime as needed.       No current facility-administered medications for this visit.    No Known Allergies  Patient Active Problem List   Diagnosis Date Noted  . PAF (paroxysmal atrial fibrillation) 09/08/2012  . Palpitations 03/05/2012  . MALIGNANT NEOPLASM OF  CECUM 12/11/2007  . COLONIC POLYPS 12/11/2007  . DIABETES MELLITUS, TYPE I 12/11/2007  . HYPERTENSION 12/11/2007    History  Smoking status  . Former Smoker  Smokeless tobacco  . Not on file    History  Alcohol Use: Not on file    Family History  Problem Relation Age of Onset  . Heart disease Father     Review of Systems: The patient denies any heat or cold intolerance.  No weight gain or weight loss.  The patient denies headaches or blurry vision.  There is no cough or sputum production.  The patient denies dizziness.  There is no hematuria or hematochezia.  The patient denies any muscle aches or arthritis.  The patient denies any rash.  The patient denies frequent falling or instability.  There is no history of depression or anxiety.  All other systems were reviewed and are negative.   Physical Exam: Filed Vitals:   04/12/13 0938  BP: 134/76  Pulse: 48   the general appearance reveals a healthy-appearing gentleman in no distress.The head and neck exam reveals pupils equal and reactive.  Extraocular movements are full.  There is no scleral icterus.  The mouth and pharynx are normal.  The neck is supple.  The carotids reveal no bruits.  The jugular venous pressure is normal.  The  thyroid is not enlarged.  There is no lymphadenopathy.  The chest is clear to percussion and auscultation.  There are no rales or rhonchi.  Expansion of the chest is symmetrical.  The precordium is quiet.  The first heart sound is normal.  The second heart sound is physiologically split.  There is no murmur gallop rub or click.  There is no abnormal lift or heave.  The abdomen is soft and nontender.  The bowel sounds are normal.  The liver and spleen are not enlarged.  There are no abdominal masses.  There are no abdominal bruits.  Extremities reveal good pedal pulses.  There is no phlebitis or edema.  There is no cyanosis or clubbing.  Strength is normal and symmetrical in all extremities.  There is no  lateralizing weakness.  There are no sensory deficits.  The skin is warm and dry.  There is no rash.  EKG confirms marked sinus bradycardia and left axis deviation and no ischemic changes.    Assessment / Plan: Continue on same medication.  Continue regular exercise at the [employer] program 3 times a week. Recheck here in 3 months for followup office visit

## 2013-06-22 ENCOUNTER — Other Ambulatory Visit: Payer: Self-pay

## 2013-07-26 ENCOUNTER — Ambulatory Visit (INDEPENDENT_AMBULATORY_CARE_PROVIDER_SITE_OTHER): Payer: Medicare Other | Admitting: Cardiology

## 2013-07-26 ENCOUNTER — Encounter: Payer: Self-pay | Admitting: Cardiology

## 2013-07-26 VITALS — BP 160/74 | HR 50 | Ht 72.0 in | Wt 175.0 lb

## 2013-07-26 DIAGNOSIS — I10 Essential (primary) hypertension: Secondary | ICD-10-CM

## 2013-07-26 DIAGNOSIS — E109 Type 1 diabetes mellitus without complications: Secondary | ICD-10-CM

## 2013-07-26 DIAGNOSIS — I4891 Unspecified atrial fibrillation: Secondary | ICD-10-CM

## 2013-07-26 DIAGNOSIS — I480 Paroxysmal atrial fibrillation: Secondary | ICD-10-CM

## 2013-07-26 NOTE — Assessment & Plan Note (Signed)
The patient has had no recurrent atrial fibrillation.  He continues to exercise regularly at the gym.

## 2013-07-26 NOTE — Progress Notes (Signed)
Curtis Dunn Date of Birth:  Dec 30, 1940 [facility] [address] [place], [place]  [address] [phone]  Fax   [phone]  HPI: This pleasant 72 year old gentleman is seen for followup office visit.  He has a history of paroxysmal atrial fibrillation.[name] He does not have any history of ischemic heart disease. He had a normal nuclear treadmill stress test in 2006 and at that time showed no evidence of ischemia and he had an ejection fraction of 57%. Recently he went in for a routine visit with his primary care provider and it was noted that he had an irregular pulse. EKG was done which confirmed that he was in atrial fibrillation with a ventricular response 91. The patient was placed on Pradaxa 150 mg twice a day for long-term anticoagulation for his atrial fibrillation. Subsequently he has been switched to Xarelto. The patient has not been expressing any symptoms of chest pain or shortness of breath. He goes to the gym on a regular basis for working out. He himself is not aware of his heartbeat unless he checks it with the machine at the gym which demonstrates that his pulse is irregular. Since going on the anticoagulation he has not been experiencing any excessive bruising or bleeding. The patient had an echocardiogram 09/09/12 showing ejection fraction 60-65% with grade 1 diastolic dysfunction and a pulmonary artery pressure of 35 and no significant valve abnormalities.  Previously he saw his orthopedist who raised the question of possible abdominal aortic aneurysm based upon physical findings. That reason the patient underwent an ultrasound of abdominal aorta on 01/05/13 which showed normal caliber abdominal aorta with no evidence of stenosis or dilatation or aneurysm.   Current Outpatient Prescriptions  Medication Sig Dispense Refill  . amLODipine (NORVASC) 10 MG tablet Take 10 mg by mouth daily.      . Ascorbic Acid (VITAMIN C) 1000 MG tablet Take 1,000 mg by  mouth daily.      . Chelated Magnesium 100 MG TABS Take by mouth daily.      . Cholecalciferol 1000 UNITS capsule Take 1,000 Units by mouth daily.      . Cyanocobalamin (VITAMIN B 12 PO) Take by mouth. Taking 1000 daily       . Docosahexaenoic Acid (DHA OMEGA 3 PO) Take 600 mg by mouth daily.       . furosemide (LASIX) 20 MG tablet Take 20 mg by mouth daily.      . hydrochlorothiazide (HYDRODIURIL) 25 MG tablet Take 25 mg by mouth daily.      . insulin glargine (LANTUS) 100 UNIT/ML injection Inject into the skin at bedtime. Taking 22 units daily      . insulin lispro (HUMALOG) 100 UNIT/ML injection Inject into the skin 3 (three) times daily before meals. Taking 15units  Am; 4 UNITS  noon and 10 UNITS units at  6 pm      . lovastatin (MEVACOR) 20 MG tablet Take 20 mg by mouth at bedtime.      . metoprolol succinate (TOPROL XL) 25 MG 24 hr tablet Take 1 tablet (25 mg total) by mouth daily.  90 tablet  3  . Potassium 99 MG TABS Take by mouth.      . ramipril (ALTACE) 10 MG tablet Take 10 mg by mouth daily.      . Rivaroxaban (XARELTO) 20 MG TABS Take 1 tablet (20 mg total) by mouth daily.  30 tablet  11  . Tamsulosin HCl (FLOMAX) 0.4 MG CAPS Take  by mouth daily.      . vitamin E 400 UNIT capsule Take 400 Units by mouth daily.      . Zinc 30 MG CAPS Take by mouth daily.      [name] zolpidem (AMBIEN) 10 MG tablet Take 10 mg by mouth at bedtime as needed.       No current facility-administered medications for this visit.    No Known Allergies  Patient Active Problem List   Diagnosis Date Noted  . PAF (paroxysmal atrial fibrillation) 09/08/2012  . Palpitations 03/05/2012  . MALIGNANT NEOPLASM OF CECUM 12/11/2007  . COLONIC POLYPS 12/11/2007  . DIABETES MELLITUS, TYPE I 12/11/2007  . HYPERTENSION 12/11/2007    History  Smoking status  . Former Smoker  Smokeless tobacco  . Not on file    History  Alcohol Use: Not on file    Family History  Problem Relation Age of Onset  . Heart  disease Father     Review of Systems: The patient denies any heat or cold intolerance.  No weight gain or weight loss.  The patient denies headaches or blurry vision.  There is no cough or sputum production.  The patient denies dizziness.  There is no hematuria or hematochezia.  The patient denies any muscle aches or arthritis.  The patient denies any rash.  The patient denies frequent falling or instability.  There is no history of depression or anxiety.  All other systems were reviewed and are negative.   Physical Exam: Filed Vitals:   07/26/13 1012  BP: 160/74  Pulse: 50   the general appearance is that of a well-developed well-nourished middle-aged gentleman in no distress.The head and neck exam reveals pupils equal and reactive.  Extraocular movements are full.  There is no scleral icterus.  The mouth and pharynx are normal.  The neck is supple.  The carotids reveal no bruits.  The jugular venous pressure is normal.  The  thyroid is not enlarged.  There is no lymphadenopathy.  The chest is clear to percussion and auscultation.  There are no rales or rhonchi.  Expansion of the chest is symmetrical.  The precordium is quiet.  The first heart sound is normal.  The second heart sound is physiologically split.  There is no murmur gallop rub or click.  There is no abnormal lift or heave.  The abdomen is soft and nontender.  The bowel sounds are normal.  The liver and spleen are not enlarged.  There are no abdominal masses.  There are no abdominal bruits.  Extremities reveal good pedal pulses.  There is no phlebitis or edema.  There is no cyanosis or clubbing.  Strength is normal and symmetrical in all extremities.  There is no lateralizing weakness.  There are no sensory deficits.  The skin is warm and dry.  There is no rash.      Assessment / Plan: Continue on same medication.  Recheck in 3 months

## 2013-07-26 NOTE — Assessment & Plan Note (Signed)
Blood pressure is remaining stable on current therapy. 

## 2013-07-26 NOTE — Assessment & Plan Note (Signed)
No hypoglycemic reactions.  Patient has an insulin pump.

## 2013-07-26 NOTE — Patient Instructions (Addendum)
Your physician recommends that you continue on your current medications as directed. Please refer to the Current Medication list given to you today.  Your physician recommends that you schedule a follow-up appointment in: 3 month ov 

## 2013-08-30 ENCOUNTER — Encounter: Payer: Self-pay | Admitting: Cardiology

## 2013-09-22 ENCOUNTER — Other Ambulatory Visit: Payer: Self-pay

## 2013-10-25 ENCOUNTER — Encounter: Payer: Self-pay | Admitting: Cardiology

## 2013-10-25 ENCOUNTER — Ambulatory Visit (INDEPENDENT_AMBULATORY_CARE_PROVIDER_SITE_OTHER): Payer: Medicare Other | Admitting: Cardiology

## 2013-10-25 VITALS — BP 140/70 | HR 50 | Ht 72.0 in | Wt 177.0 lb

## 2013-10-25 DIAGNOSIS — I480 Paroxysmal atrial fibrillation: Secondary | ICD-10-CM

## 2013-10-25 DIAGNOSIS — I10 Essential (primary) hypertension: Secondary | ICD-10-CM

## 2013-10-25 DIAGNOSIS — I4891 Unspecified atrial fibrillation: Secondary | ICD-10-CM

## 2013-10-25 MED ORDER — METOPROLOL SUCCINATE ER 25 MG PO TB24: 25.0000 mg | ORAL_TABLET | Freq: Every day | ORAL | Status: DC

## 2013-10-25 NOTE — Assessment & Plan Note (Signed)
No further atrial fibrillation.  He is tolerating Xarelto without side effects.

## 2013-10-25 NOTE — Assessment & Plan Note (Signed)
Blood pressure is remaining stable on current therapy. 

## 2013-10-25 NOTE — Patient Instructions (Signed)
Your physician recommends that you continue on your current medications as directed. Please refer to the Current Medication list given to you today.  Your physician wants you to follow-up in: 4 month ov/ekg  You will receive a reminder letter in the mail two months in advance. If you don't receive a letter, please call our office to schedule the follow-up appointment.  

## 2013-10-25 NOTE — Progress Notes (Signed)
Curtis Dunn Date of Birth:  January 23, 1941 [address] [place], [place]  [address] [phone]  Fax   [phone]  HPI: This pleasant 72 year old gentleman is seen for followup office visit.  He has a history of paroxysmal atrial fibrillation.[name] He does not have any history of ischemic heart disease. He had a normal nuclear treadmill stress test in 2006 and at that time showed no evidence of ischemia and he had an ejection fraction of 57%.  We saw him after he went  in for a routine visit with his primary care provider and it was noted that he had an irregular pulse. EKG was done which confirmed that he was in atrial fibrillation with a ventricular response 91. The patient was placed on Pradaxa 150 mg twice a day for long-term anticoagulation for his atrial fibrillation. Subsequently he has been switched to Xarelto. The patient has not been expressing any symptoms of chest pain or shortness of breath. He goes to the gym on a regular basis for working out. He himself is not aware of his heartbeat unless he checks it with the machine at the gym which demonstrates that his pulse is irregular. Since going on the anticoagulation he has not been experiencing any excessive bruising or bleeding. The patient had an echocardiogram 09/09/12 showing ejection fraction 60-65% with grade 1 diastolic dysfunction and a pulmonary artery pressure of 35 and no significant valve abnormalities.  Previously he saw his orthopedist who raised the question of possible abdominal aortic aneurysm based upon physical findings. That reason the patient underwent an ultrasound of abdominal aorta on 01/05/13 which showed normal caliber abdominal aorta with no evidence of stenosis or dilatation or aneurysm.  Since last visit the patient has not had any further atrial fibrillation.  He denies any cardiac symptoms   Current Outpatient Prescriptions  Medication Sig Dispense Refill  . amLODipine (NORVASC) 10 MG  tablet Take 10 mg by mouth daily.      . Ascorbic Acid (VITAMIN C) 1000 MG tablet Take 1,000 mg by mouth daily.      . Chelated Magnesium 100 MG TABS Take by mouth daily.      . Cholecalciferol 1000 UNITS capsule Take 1,000 Units by mouth daily.      . Cyanocobalamin (VITAMIN B 12 PO) Take by mouth. Taking 1000 daily       . Docosahexaenoic Acid (DHA OMEGA 3 PO) Take 1,200 mg by mouth daily.       . furosemide (LASIX) 20 MG tablet Take 20 mg by mouth daily.      . hydrochlorothiazide (HYDRODIURIL) 25 MG tablet Take 25 mg by mouth daily.      . insulin glargine (LANTUS) 100 UNIT/ML injection Inject into the skin at bedtime. Taking 22 units daily      . insulin lispro (HUMALOG) 100 UNIT/ML injection Inject into the skin 3 (three) times daily before meals. Taking 15units  Am; 4 UNITS  noon and 10 UNITS units at  6 pm      . lovastatin (MEVACOR) 20 MG tablet Take 20 mg by mouth at bedtime.      . metoprolol succinate (TOPROL XL) 25 MG 24 hr tablet Take 1 tablet (25 mg total) by mouth daily.  90 tablet  3  . Potassium 99 MG TABS Take by mouth.      . ramipril (ALTACE) 10 MG tablet Take 10 mg by mouth daily.      . Rivaroxaban (XARELTO) 20 MG  TABS Take 1 tablet (20 mg total) by mouth daily.  30 tablet  11  . Tamsulosin HCl (FLOMAX) 0.4 MG CAPS Take by mouth daily.      . vitamin E 400 UNIT capsule Take 400 Units by mouth daily.      . Zinc 30 MG CAPS Take 25 mg by mouth daily.       [name] zolpidem (AMBIEN) 10 MG tablet Take 10 mg by mouth at bedtime as needed.       No current facility-administered medications for this visit.    No Known Allergies  Patient Active Problem List   Diagnosis Date Noted  . PAF (paroxysmal atrial fibrillation) 09/08/2012  . Palpitations 03/05/2012  . MALIGNANT NEOPLASM OF CECUM 12/11/2007  . COLONIC POLYPS 12/11/2007  . DIABETES MELLITUS, TYPE I 12/11/2007  . HYPERTENSION 12/11/2007    History  Smoking status  . Former Smoker  Smokeless tobacco  . Not on file      History  Alcohol Use: Not on file    Family History  Problem Relation Age of Onset  . Heart disease Father     Review of Systems: The patient denies any heat or cold intolerance.  No weight gain or weight loss.  The patient denies headaches or blurry vision.  There is no cough or sputum production.  The patient denies dizziness.  There is no hematuria or hematochezia.  The patient denies any muscle aches or arthritis.  The patient denies any rash.  The patient denies frequent falling or instability.  There is no history of depression or anxiety.  All other systems were reviewed and are negative.   Physical Exam: Filed Vitals:   10/25/13 1133  BP: 140/70  Pulse: 50   the general appearance is that of a well-developed well-nourished middle-aged gentleman in no distress.The head and neck exam reveals pupils equal and reactive.  Extraocular movements are full.  There is no scleral icterus.  The mouth and pharynx are normal.  The neck is supple.  The carotids reveal no bruits.  The jugular venous pressure is normal.  The  thyroid is not enlarged.  There is no lymphadenopathy.  The chest is clear to percussion and auscultation.  There are no rales or rhonchi.  Expansion of the chest is symmetrical.  The precordium is quiet.  The first heart sound is normal.  The second heart sound is physiologically split.  There is no murmur gallop rub or click.  There is no abnormal lift or heave.  The abdomen is soft and nontender.  The bowel sounds are normal.  The liver and spleen are not enlarged.  There are no abdominal masses.  There are no abdominal bruits.  Extremities reveal good pedal pulses.  There is no phlebitis or edema.  There is no cyanosis or clubbing.  Strength is normal and symmetrical in all extremities.  There is no lateralizing weakness.  There are no sensory deficits.  The skin is warm and dry.  There is no rash.      Assessment / Plan: Continue on same medication.  Recheck in 4  months for office visit and EKG.

## 2013-10-27 ENCOUNTER — Encounter: Payer: Self-pay | Admitting: *Deleted

## 2013-11-23 ENCOUNTER — Encounter: Payer: Self-pay | Admitting: Internal Medicine

## 2014-02-10 ENCOUNTER — Emergency Department (HOSPITAL_COMMUNITY): Payer: Medicare Other

## 2014-02-10 ENCOUNTER — Inpatient Hospital Stay (HOSPITAL_COMMUNITY)
Admission: EM | Admit: 2014-02-10 | Discharge: 2014-02-14 | DRG: 552 | Disposition: A | Discharge status: Home / Self Care | Payer: Medicare Other | Attending: Surgery | Admitting: Surgery

## 2014-02-10 ENCOUNTER — Encounter (HOSPITAL_COMMUNITY): Payer: Self-pay | Admitting: Emergency Medicine

## 2014-02-10 DIAGNOSIS — I5030 Unspecified diastolic (congestive) heart failure: Secondary | ICD-10-CM | POA: Diagnosis present

## 2014-02-10 DIAGNOSIS — E1065 Type 1 diabetes mellitus with hyperglycemia: Secondary | ICD-10-CM

## 2014-02-10 DIAGNOSIS — Z85038 Personal history of other malignant neoplasm of large intestine: Secondary | ICD-10-CM | POA: Diagnosis not present

## 2014-02-10 DIAGNOSIS — I4891 Unspecified atrial fibrillation: Secondary | ICD-10-CM | POA: Diagnosis present

## 2014-02-10 DIAGNOSIS — E162 Hypoglycemia, unspecified: Secondary | ICD-10-CM

## 2014-02-10 DIAGNOSIS — S060X9A Concussion with loss of consciousness of unspecified duration, initial encounter: Secondary | ICD-10-CM

## 2014-02-10 DIAGNOSIS — I480 Paroxysmal atrial fibrillation: Secondary | ICD-10-CM

## 2014-02-10 DIAGNOSIS — E785 Hyperlipidemia, unspecified: Secondary | ICD-10-CM

## 2014-02-10 DIAGNOSIS — S52599A Other fractures of lower end of unspecified radius, initial encounter for closed fracture: Secondary | ICD-10-CM | POA: Diagnosis present

## 2014-02-10 DIAGNOSIS — Z7901 Long term (current) use of anticoagulants: Secondary | ICD-10-CM

## 2014-02-10 DIAGNOSIS — S1093XA Contusion of unspecified part of neck, initial encounter: Secondary | ICD-10-CM

## 2014-02-10 DIAGNOSIS — I10 Essential (primary) hypertension: Secondary | ICD-10-CM | POA: Diagnosis present

## 2014-02-10 DIAGNOSIS — E1169 Type 2 diabetes mellitus with other specified complication: Secondary | ICD-10-CM | POA: Diagnosis present

## 2014-02-10 DIAGNOSIS — S1201XA Stable burst fracture of first cervical vertebra, initial encounter for closed fracture: Secondary | ICD-10-CM

## 2014-02-10 DIAGNOSIS — S060XAA Concussion with loss of consciousness status unknown, initial encounter: Secondary | ICD-10-CM | POA: Diagnosis present

## 2014-02-10 DIAGNOSIS — Z8249 Family history of ischemic heart disease and other diseases of the circulatory system: Secondary | ICD-10-CM

## 2014-02-10 DIAGNOSIS — S129XXA Fracture of neck, unspecified, initial encounter: Secondary | ICD-10-CM | POA: Diagnosis present

## 2014-02-10 DIAGNOSIS — IMO0002 Reserved for concepts with insufficient information to code with codable children: Secondary | ICD-10-CM

## 2014-02-10 DIAGNOSIS — D126 Benign neoplasm of colon, unspecified: Secondary | ICD-10-CM

## 2014-02-10 DIAGNOSIS — W108XXA Fall (on) (from) other stairs and steps, initial encounter: Secondary | ICD-10-CM | POA: Diagnosis present

## 2014-02-10 DIAGNOSIS — S0180XA Unspecified open wound of other part of head, initial encounter: Secondary | ICD-10-CM | POA: Diagnosis present

## 2014-02-10 DIAGNOSIS — S12000A Unspecified displaced fracture of first cervical vertebra, initial encounter for closed fracture: Principal | ICD-10-CM | POA: Diagnosis present

## 2014-02-10 DIAGNOSIS — I509 Heart failure, unspecified: Secondary | ICD-10-CM | POA: Diagnosis present

## 2014-02-10 DIAGNOSIS — Z87891 Personal history of nicotine dependence: Secondary | ICD-10-CM | POA: Diagnosis not present

## 2014-02-10 DIAGNOSIS — S0190XA Unspecified open wound of unspecified part of head, initial encounter: Secondary | ICD-10-CM | POA: Diagnosis present

## 2014-02-10 DIAGNOSIS — W19XXXA Unspecified fall, initial encounter: Secondary | ICD-10-CM | POA: Diagnosis present

## 2014-02-10 DIAGNOSIS — S62109A Fracture of unspecified carpal bone, unspecified wrist, initial encounter for closed fracture: Secondary | ICD-10-CM | POA: Diagnosis present

## 2014-02-10 DIAGNOSIS — D62 Acute posthemorrhagic anemia: Secondary | ICD-10-CM | POA: Diagnosis present

## 2014-02-10 DIAGNOSIS — S52501A Unspecified fracture of the lower end of right radius, initial encounter for closed fracture: Secondary | ICD-10-CM | POA: Diagnosis present

## 2014-02-10 DIAGNOSIS — E108 Type 1 diabetes mellitus with unspecified complications: Secondary | ICD-10-CM

## 2014-02-10 DIAGNOSIS — Y92009 Unspecified place in unspecified non-institutional (private) residence as the place of occurrence of the external cause: Secondary | ICD-10-CM | POA: Diagnosis present

## 2014-02-10 DIAGNOSIS — D72829 Elevated white blood cell count, unspecified: Secondary | ICD-10-CM | POA: Diagnosis present

## 2014-02-10 DIAGNOSIS — S0083XA Contusion of other part of head, initial encounter: Secondary | ICD-10-CM

## 2014-02-10 DIAGNOSIS — S0003XA Contusion of scalp, initial encounter: Secondary | ICD-10-CM | POA: Diagnosis present

## 2014-02-10 DIAGNOSIS — S0181XA Laceration without foreign body of other part of head, initial encounter: Secondary | ICD-10-CM | POA: Diagnosis present

## 2014-02-10 DIAGNOSIS — I5032 Chronic diastolic (congestive) heart failure: Secondary | ICD-10-CM | POA: Diagnosis present

## 2014-02-10 DIAGNOSIS — S0300XA Dislocation of jaw, unspecified side, initial encounter: Secondary | ICD-10-CM | POA: Diagnosis present

## 2014-02-10 LAB — CBC WITH DIFFERENTIAL/PLATELET
Basophils Absolute: 0 10*3/uL (ref 0.0–0.1)
Basophils Relative: 0 % (ref 0–1)
Eosinophils Absolute: 0 10*3/uL (ref 0.0–0.7)
Eosinophils Relative: 0 % (ref 0–5)
HCT: 39.1 % (ref 39.0–52.0)
Hemoglobin: 13.5 g/dL (ref 13.0–17.0)
Lymphocytes Relative: 6 % — ABNORMAL LOW (ref 12–46)
Lymphs Abs: 0.8 10*3/uL (ref 0.7–4.0)
MCH: 32 pg (ref 26.0–34.0)
MCHC: 34.5 g/dL (ref 30.0–36.0)
MCV: 92.7 fL (ref 78.0–100.0)
Monocytes Absolute: 0.8 10*3/uL (ref 0.1–1.0)
Monocytes Relative: 6 % (ref 3–12)
Neutro Abs: 12.2 10*3/uL — ABNORMAL HIGH (ref 1.7–7.7)
Neutrophils Relative %: 88 % — ABNORMAL HIGH (ref 43–77)
Platelets: 194 10*3/uL (ref 150–400)
RBC: 4.22 MIL/uL (ref 4.22–5.81)
RDW: 12.9 % (ref 11.5–15.5)
WBC: 13.8 10*3/uL — ABNORMAL HIGH (ref 4.0–10.5)

## 2014-02-10 LAB — URINALYSIS, ROUTINE W REFLEX MICROSCOPIC
Bilirubin Urine: NEGATIVE
Glucose, UA: NEGATIVE mg/dL
Hgb urine dipstick: NEGATIVE
Ketones, ur: 40 mg/dL — AB
Leukocytes, UA: NEGATIVE
Nitrite: NEGATIVE
Protein, ur: NEGATIVE mg/dL
Specific Gravity, Urine: 1.021 (ref 1.005–1.030)
Urobilinogen, UA: 0.2 mg/dL (ref 0.0–1.0)
pH: 7.5 (ref 5.0–8.0)

## 2014-02-10 LAB — PROTIME-INR
INR: 1.06 (ref 0.00–1.49)
Prothrombin Time: 13.6 seconds (ref 11.6–15.2)

## 2014-02-10 LAB — APTT: aPTT: 28 seconds (ref 24–37)

## 2014-02-10 LAB — COMPREHENSIVE METABOLIC PANEL
ALT: 30 U/L (ref 0–53)
AST: 42 U/L — ABNORMAL HIGH (ref 0–37)
Albumin: 4 g/dL (ref 3.5–5.2)
Alkaline Phosphatase: 65 U/L (ref 39–117)
BUN: 23 mg/dL (ref 6–23)
CO2: 24 mEq/L (ref 19–32)
Calcium: 9.4 mg/dL (ref 8.4–10.5)
Chloride: 103 mEq/L (ref 96–112)
Creatinine, Ser: 1.09 mg/dL (ref 0.50–1.35)
GFR calc Af Amer: 76 mL/min — ABNORMAL LOW (ref >90)
GFR calc non Af Amer: 66 mL/min — ABNORMAL LOW (ref >90)
Glucose, Bld: 152 mg/dL — ABNORMAL HIGH (ref 70–99)
Potassium: 3.9 mEq/L (ref 3.7–5.3)
Sodium: 142 mEq/L (ref 137–147)
Total Bilirubin: 0.6 mg/dL (ref 0.3–1.2)
Total Protein: 6.9 g/dL (ref 6.0–8.3)

## 2014-02-10 LAB — CBG MONITORING, ED
Glucose-Capillary: 86 mg/dL (ref 70–99)
Glucose-Capillary: 229 mg/dL — ABNORMAL HIGH (ref 70–99)
Glucose-Capillary: 190 mg/dL — ABNORMAL HIGH (ref 70–99)

## 2014-02-10 LAB — GLUCOSE, CAPILLARY
Glucose-Capillary: 205 mg/dL — ABNORMAL HIGH (ref 70–99)
Glucose-Capillary: 177 mg/dL — ABNORMAL HIGH (ref 70–99)

## 2014-02-10 LAB — MRSA PCR SCREENING: MRSA by PCR: NEGATIVE

## 2014-02-10 MED ORDER — SIMVASTATIN 10 MG PO TABS
10.0000 mg | ORAL_TABLET | Freq: Every day | ORAL | Status: DC
  Administered 2014-02-11 – 2014-02-13 (×3): 10 mg via ORAL
  Filled 2014-02-10 (×5): qty 1

## 2014-02-10 MED ORDER — TAMSULOSIN HCL 0.4 MG PO CAPS
0.4000 mg | ORAL_CAPSULE | Freq: Every day | ORAL | Status: DC
  Administered 2014-02-10 – 2014-02-14 (×5): 0.4 mg via ORAL
  Filled 2014-02-10 (×5): qty 1

## 2014-02-10 MED ORDER — HYDROMORPHONE HCL PF 1 MG/ML IJ SOLN
1.0000 mg | INTRAMUSCULAR | Status: DC | PRN
  Administered 2014-02-10 – 2014-02-11 (×6): 1 mg via INTRAVENOUS
  Filled 2014-02-10 (×7): qty 1

## 2014-02-10 MED ORDER — FUROSEMIDE 20 MG PO TABS
20.0000 mg | ORAL_TABLET | Freq: Every day | ORAL | Status: DC
  Administered 2014-02-10 – 2014-02-14 (×5): 20 mg via ORAL
  Filled 2014-02-10 (×5): qty 1

## 2014-02-10 MED ORDER — AMLODIPINE BESYLATE 10 MG PO TABS
10.0000 mg | ORAL_TABLET | Freq: Every day | ORAL | Status: DC
  Administered 2014-02-10 – 2014-02-14 (×5): 10 mg via ORAL
  Filled 2014-02-10 (×5): qty 1

## 2014-02-10 MED ORDER — INSULIN ASPART 100 UNIT/ML ~~LOC~~ SOLN
0.0000 [IU] | SUBCUTANEOUS | Status: DC
Start: 2014-02-10 — End: 2014-02-11
  Administered 2014-02-10 (×2): 3 [IU] via SUBCUTANEOUS
  Administered 2014-02-10 – 2014-02-11 (×3): 5 [IU] via SUBCUTANEOUS
  Administered 2014-02-11: 3 [IU] via SUBCUTANEOUS
  Administered 2014-02-11: 2 [IU] via SUBCUTANEOUS
  Filled 2014-02-10 (×2): qty 1

## 2014-02-10 MED ORDER — METOPROLOL SUCCINATE ER 25 MG PO TB24
25.0000 mg | ORAL_TABLET | Freq: Every day | ORAL | Status: DC
  Administered 2014-02-10 – 2014-02-14 (×5): 25 mg via ORAL
  Filled 2014-02-10 (×5): qty 1

## 2014-02-10 MED ORDER — INSULIN GLARGINE 100 UNIT/ML ~~LOC~~ SOLN
11.0000 [IU] | Freq: Two times a day (BID) | SUBCUTANEOUS | Status: DC
  Administered 2014-02-10 – 2014-02-13 (×6): 11 [IU] via SUBCUTANEOUS
  Filled 2014-02-10 (×8): qty 0.11

## 2014-02-10 MED ORDER — OXYMETAZOLINE HCL 0.05 % NA SOLN
1.0000 | Freq: Once | NASAL | Status: AC
  Administered 2014-02-10: 1 via NASAL
  Filled 2014-02-10: qty 15

## 2014-02-10 MED ORDER — LACTATED RINGERS IV SOLN
INTRAVENOUS | Status: DC
  Administered 2014-02-10: 100 mL/h via INTRAVENOUS
  Administered 2014-02-11: 04:00:00 via INTRAVENOUS

## 2014-02-10 MED ORDER — ONDANSETRON HCL 4 MG/2ML IJ SOLN
4.0000 mg | Freq: Once | INTRAMUSCULAR | Status: AC
  Administered 2014-02-10: 4 mg via INTRAVENOUS
  Filled 2014-02-10: qty 2

## 2014-02-10 MED ORDER — PROTHROMBIN COMPLEX CONC HUMAN 500 UNITS IV KIT
4166.0000 [IU] | PACK | Freq: Once | Status: AC
  Administered 2014-02-10: 4166 [IU] via INTRAVENOUS
  Filled 2014-02-10: qty 167

## 2014-02-10 MED ORDER — ONDANSETRON HCL 4 MG PO TABS
4.0000 mg | ORAL_TABLET | Freq: Four times a day (QID) | ORAL | Status: DC | PRN
  Administered 2014-02-11: 4 mg via ORAL
  Filled 2014-02-10: qty 1

## 2014-02-10 MED ORDER — HYDROCHLOROTHIAZIDE 25 MG PO TABS
25.0000 mg | ORAL_TABLET | Freq: Every day | ORAL | Status: DC
  Administered 2014-02-10 – 2014-02-14 (×5): 25 mg via ORAL
  Filled 2014-02-10 (×5): qty 1

## 2014-02-10 MED ORDER — ONDANSETRON HCL 4 MG/2ML IJ SOLN: 4.0000 mg | Freq: Four times a day (QID) | INTRAMUSCULAR | Status: DC | PRN

## 2014-02-10 MED ORDER — ZOLPIDEM TARTRATE 5 MG PO TABS
5.0000 mg | ORAL_TABLET | Freq: Every evening | ORAL | Status: DC | PRN
Start: 2014-02-10 — End: 2014-02-14
  Administered 2014-02-13: 5 mg via ORAL
  Filled 2014-02-10: qty 1

## 2014-02-10 MED ORDER — MORPHINE SULFATE 4 MG/ML IJ SOLN
4.0000 mg | Freq: Once | INTRAMUSCULAR | Status: AC
  Administered 2014-02-10: 4 mg via INTRAVENOUS
  Filled 2014-02-10: qty 1

## 2014-02-10 NOTE — ED Provider Notes (Addendum)
CSN: [id number]     Arrival date & time 02/10/14  [id number] History   First MD Initiated Contact with Patient 02/10/14 0310     Chief Complaint  Patient presents with  . Fall     (Consider location/radiation/quality/duration/timing/severity/associated sxs/prior Treatment) HPI Comments: Patient complaining of difficulty breathing out of his nose feeling that there was something swollen in his nose but denies any throat or facial pain. Also complaining of right wrist pain.  Patient is a 73 y.o. male presenting with fall. The history is provided by the patient.  Fall This is a new problem. The current episode started 1 to 2 hours ago. The problem occurs constantly. The problem has not changed since onset.Associated symptoms comments: Pt does not remember anything but states had a hypoglycemic event and fell down the stairs.  Amnestic to the events. Does not remember crawling to his bedroom or calling his son. Patient took 3 glucose tablets after waking up and when family got there his blood sugar was in the 60s.. Nothing aggravates the symptoms. Nothing relieves the symptoms. He has tried nothing for the symptoms. The treatment provided no relief.    Past Medical History  Diagnosis Date  . Palpitations 07/16/2005    normal nuclear study  . Tachycardia   . Diabetes mellitus   . Hypertension   . Hyperlipidemia   . Colon cancer    Past Surgical History  Procedure Laterality Date  . Appendectomy  2001  . Colon resection    . Elbow fracture surgery    . Arm surgery     Family History  Problem Relation Age of Onset  . Heart disease Father    History  Substance Use Topics  . Smoking status: Former Research scientist (life sciences)  . Smokeless tobacco: Not on file  . Alcohol Use: Not on file    Review of Systems  All other systems reviewed and are negative.      Allergies  Review of patient's allergies indicates no known allergies.  Home Medications   Current Outpatient Rx  Name  Route  Sig  Dispense   Refill  . amLODipine (NORVASC) 10 MG tablet   Oral   Take 10 mg by mouth daily.         . Ascorbic Acid (VITAMIN C) 1000 MG tablet   Oral   Take 1,000 mg by mouth daily.         . Chelated Magnesium 100 MG TABS   Oral   Take by mouth daily.         . Cholecalciferol 1000 UNITS capsule   Oral   Take 1,000 Units by mouth daily.         . Cyanocobalamin (VITAMIN B 12 PO)   Oral   Take by mouth. Taking 1000 daily          . Docosahexaenoic Acid (DHA OMEGA 3 PO)   Oral   Take 1,200 mg by mouth daily.          . furosemide (LASIX) 20 MG tablet   Oral   Take 20 mg by mouth daily.         . hydrochlorothiazide (HYDRODIURIL) 25 MG tablet   Oral   Take 25 mg by mouth daily.         . insulin glargine (LANTUS) 100 UNIT/ML injection   Subcutaneous   Inject into the skin at bedtime. Taking 22 units daily         . insulin lispro (HUMALOG) 100 UNIT/ML  injection   Subcutaneous   Inject into the skin 3 (three) times daily before meals. Taking 15units  Am; 4 UNITS  noon and 10 UNITS units at  6 pm         . lovastatin (MEVACOR) 20 MG tablet   Oral   Take 20 mg by mouth at bedtime.         . metoprolol succinate (TOPROL XL) 25 MG 24 hr tablet   Oral   Take 1 tablet (25 mg total) by mouth daily.   90 tablet   3   . Potassium 99 MG TABS   Oral   Take by mouth.         . ramipril (ALTACE) 10 MG tablet   Oral   Take 10 mg by mouth daily.         . Rivaroxaban (XARELTO) 20 MG TABS   Oral   Take 1 tablet (20 mg total) by mouth daily.   30 tablet   11   . Tamsulosin HCl (FLOMAX) 0.4 MG CAPS   Oral   Take by mouth daily.         . vitamin E 400 UNIT capsule   Oral   Take 400 Units by mouth daily.         . Zinc 30 MG CAPS   Oral   Take 25 mg by mouth daily.          [name] zolpidem (AMBIEN) 10 MG tablet   Oral   Take 10 mg by mouth at bedtime as needed.          BP 157/73  Pulse 74  Temp(Src) 98.4 F (36.9 C) (Oral)  Resp 22   SpO2 100% Physical Exam  Nursing note and vitals reviewed. Constitutional: He is oriented to person, place, and time. He appears well-developed and well-nourished. No distress.  HENT:  Head: Normocephalic. Head is with contusion and with laceration.    Right Ear: Tympanic membrane and ear canal normal.  Left Ear: Tympanic membrane and ear canal normal.  Nose: Nose normal.  Mouth/Throat: Oropharynx is clear and moist. No trismus in the jaw.  Eyes: Conjunctivae and EOM are normal. Pupils are equal, round, and reactive to light.  Neck: Normal range of motion. Neck supple.  Cardiovascular: Normal rate, regular rhythm and intact distal pulses.   No murmur heard. Pulmonary/Chest: Effort normal and breath sounds normal. No respiratory distress. He has no wheezes. He has no rales.  Abdominal: Soft. He exhibits no distension. There is no tenderness. There is no rebound and no guarding.  Musculoskeletal: Normal range of motion. He exhibits no edema and no tenderness.  Neurological: He is alert and oriented to person, place, and time.  Skin: Skin is warm and dry. No rash noted. No erythema.  Psychiatric: He has a normal mood and affect. His behavior is normal.    ED Course  Procedures (including critical care time) Labs Review Labs Reviewed  CBC WITH DIFFERENTIAL - Abnormal; Notable for the following:    WBC 13.8 (*)    Neutrophils Relative % 88 (*)    Neutro Abs 12.2 (*)    Lymphocytes Relative 6 (*)    All other components within normal limits  COMPREHENSIVE METABOLIC PANEL - Abnormal; Notable for the following:    Glucose, Bld 152 (*)    AST 42 (*)    GFR calc non Af Amer 66 (*)    GFR calc Af Amer 76 (*)    All  other components within normal limits  URINALYSIS, ROUTINE W REFLEX MICROSCOPIC  PROTIME-INR  APTT  CBG MONITORING, ED   Imaging Review Dg Chest 2 View  02/10/2014   CLINICAL DATA:  History of trauma from a fall.  EXAM: CHEST  2 VIEW  COMPARISON:  No priors.  FINDINGS:  Lung volumes are normal. No consolidative airspace disease. No pleural effusions. No pneumothorax. No pulmonary nodule or mass noted. Pulmonary vasculature and the cardiomediastinal silhouette are within normal limits.  IMPRESSION: 1.  No radiographic evidence of acute cardiopulmonary disease.   Electronically Signed   By: Vinnie Langton M.D.   On: 02/10/2014 04:13   Dg Wrist Complete Right  02/10/2014   CLINICAL DATA:  History of trauma from a fall complaining of right-sided wrist pain.  EXAM: RIGHT WRIST - COMPLETE 3+ VIEW  COMPARISON:  No priors.  FINDINGS: Minimally displaced fracture through the posterior aspect of the distal radius, which appears to extend through the posterior aspect of the articular surface at the radiocarpal joint. Distal ulna appears intact. Overlying soft tissues appear markedly swollen. Carpals appear intact.  IMPRESSION: 1. Mildly displaced fracture of the posterior aspect of the distal radius with probable intra-articular extension, as above.   Electronically Signed   By: Vinnie Langton M.D.   On: 02/10/2014 04:12   Ct Head Wo Contrast  02/10/2014   CLINICAL DATA:  History of trauma from a fall.  EXAM: CT HEAD WITHOUT CONTRAST  CT MAXILLOFACIAL WITHOUT CONTRAST  CT CERVICAL SPINE WITHOUT CONTRAST  TECHNIQUE: Multidetector CT imaging of the head, cervical spine, and maxillofacial structures were performed using the standard protocol without intravenous contrast. Multiplanar CT image reconstructions of the cervical spine and maxillofacial structures were also generated.  COMPARISON:  No priors.  FINDINGS: CT HEAD FINDINGS  Extensive soft tissue swelling and high attenuation in the left frontal scalp, lateral aspect of the periorbital region, and overlying the left maxilla anteriorly and laterally, compatible with contusion/hematoma. No acute posttraumatic intracranial hemorrhage, definite signs of acute/subacute cerebral ischemia, mass or mass effect, hydrocephalus or other  abnormal intra or extra-axial fluid collections. Mucosal thickening throughout the ethmoid and maxillary sinuses bilaterally. Small mucosal retention cysts or polyps in the right maxillary sinus. Mastoids are well pneumatized bilaterally.  CT MAXILLOFACIAL FINDINGS  The right mandibular condyle is mildly anteriorly subluxed, while the left mandibular condyles is dislocated anteriorly. The mandible appears intact. The acute displaced facial bone fractures. Pterygoid plates are intact. Extensive soft tissue swelling in the left cerebral regions. Left globe and retro-orbital soft tissues are grossly normal in appearance.  CT CERVICAL SPINE FINDINGS  Comminuted fracture of C1 involving the anterior arch, posterior arch and anterior aspect of the left lateral mass. Fractures are displaced, with outward expansion of the C1 ring. Multiple small fracture fragments are noted anteriorly around the odontoid process. Posterior and lateral displacement of the right lateral mass relative to the lateral mass of C2, and mild posterior subluxation of the left lateral mass relative to the lateral mass of C2. C2 itself appears intact. High attenuation material in the deep prevertebral soft tissues is compatible with hematoma. No additional acute displaced fractures of the cervical spine are noted. Severe multilevel degenerative disc disease, most pronounced at C5-C6 and C6-C7. Multilevel facet arthropathy, including fusion on the left from C3-C5. Visualized portions of the upper thorax are unremarkable.  IMPRESSION: 1. Acute comminuted displaced fracture of C1 vertebra, with gross disruption of C1-C2 articulation, and large hematoma in the prevertebral soft tissues,  as detailed above. 2. Soft tissue contusion and hematoma in the left frontal scalp and left periorbital regions. No acute displaced skull fracture or displaced facial bone fractures. However, the left mandibular condyle is dislocated anteriorly, and the right mandibular  condyle is anteriorly subluxed. 3. No acute intracranial abnormalities. 4. Mild paranasal sinus disease, as above, without acute features. Critical Value/emergent results were called by telephone at the time of interpretation on 02/10/2014 at 4:42 AM to Dr. Blanchie Dessert, who verbally acknowledged these results.   Electronically Signed   By: Vinnie Langton M.D.   On: 02/10/2014 04:47   Ct Cervical Spine Wo Contrast  02/10/2014   CLINICAL DATA:  History of trauma from a fall.  EXAM: CT HEAD WITHOUT CONTRAST  CT MAXILLOFACIAL WITHOUT CONTRAST  CT CERVICAL SPINE WITHOUT CONTRAST  TECHNIQUE: Multidetector CT imaging of the head, cervical spine, and maxillofacial structures were performed using the standard protocol without intravenous contrast. Multiplanar CT image reconstructions of the cervical spine and maxillofacial structures were also generated.  COMPARISON:  No priors.  FINDINGS: CT HEAD FINDINGS  Extensive soft tissue swelling and high attenuation in the left frontal scalp, lateral aspect of the periorbital region, and overlying the left maxilla anteriorly and laterally, compatible with contusion/hematoma. No acute posttraumatic intracranial hemorrhage, definite signs of acute/subacute cerebral ischemia, mass or mass effect, hydrocephalus or other abnormal intra or extra-axial fluid collections. Mucosal thickening throughout the ethmoid and maxillary sinuses bilaterally. Small mucosal retention cysts or polyps in the right maxillary sinus. Mastoids are well pneumatized bilaterally.  CT MAXILLOFACIAL FINDINGS  The right mandibular condyle is mildly anteriorly subluxed, while the left mandibular condyles is dislocated anteriorly. The mandible appears intact. The acute displaced facial bone fractures. Pterygoid plates are intact. Extensive soft tissue swelling in the left cerebral regions. Left globe and retro-orbital soft tissues are grossly normal in appearance.  CT CERVICAL SPINE FINDINGS  Comminuted  fracture of C1 involving the anterior arch, posterior arch and anterior aspect of the left lateral mass. Fractures are displaced, with outward expansion of the C1 ring. Multiple small fracture fragments are noted anteriorly around the odontoid process. Posterior and lateral displacement of the right lateral mass relative to the lateral mass of C2, and mild posterior subluxation of the left lateral mass relative to the lateral mass of C2. C2 itself appears intact. High attenuation material in the deep prevertebral soft tissues is compatible with hematoma. No additional acute displaced fractures of the cervical spine are noted. Severe multilevel degenerative disc disease, most pronounced at C5-C6 and C6-C7. Multilevel facet arthropathy, including fusion on the left from C3-C5. Visualized portions of the upper thorax are unremarkable.  IMPRESSION: 1. Acute comminuted displaced fracture of C1 vertebra, with gross disruption of C1-C2 articulation, and large hematoma in the prevertebral soft tissues, as detailed above. 2. Soft tissue contusion and hematoma in the left frontal scalp and left periorbital regions. No acute displaced skull fracture or displaced facial bone fractures. However, the left mandibular condyle is dislocated anteriorly, and the right mandibular condyle is anteriorly subluxed. 3. No acute intracranial abnormalities. 4. Mild paranasal sinus disease, as above, without acute features. Critical Value/emergent results were called by telephone at the time of interpretation on 02/10/2014 at 4:42 AM to Dr. Blanchie Dessert, who verbally acknowledged these results.   Electronically Signed   By: Vinnie Langton M.D.   On: 02/10/2014 04:47   Ct Maxillofacial Wo Cm  02/10/2014   CLINICAL DATA:  History of trauma from a fall.  EXAM:  CT HEAD WITHOUT CONTRAST  CT MAXILLOFACIAL WITHOUT CONTRAST  CT CERVICAL SPINE WITHOUT CONTRAST  TECHNIQUE: Multidetector CT imaging of the head, cervical spine, and maxillofacial  structures were performed using the standard protocol without intravenous contrast. Multiplanar CT image reconstructions of the cervical spine and maxillofacial structures were also generated.  COMPARISON:  No priors.  FINDINGS: CT HEAD FINDINGS  Extensive soft tissue swelling and high attenuation in the left frontal scalp, lateral aspect of the periorbital region, and overlying the left maxilla anteriorly and laterally, compatible with contusion/hematoma. No acute posttraumatic intracranial hemorrhage, definite signs of acute/subacute cerebral ischemia, mass or mass effect, hydrocephalus or other abnormal intra or extra-axial fluid collections. Mucosal thickening throughout the ethmoid and maxillary sinuses bilaterally. Small mucosal retention cysts or polyps in the right maxillary sinus. Mastoids are well pneumatized bilaterally.  CT MAXILLOFACIAL FINDINGS  The right mandibular condyle is mildly anteriorly subluxed, while the left mandibular condyles is dislocated anteriorly. The mandible appears intact. The acute displaced facial bone fractures. Pterygoid plates are intact. Extensive soft tissue swelling in the left cerebral regions. Left globe and retro-orbital soft tissues are grossly normal in appearance.  CT CERVICAL SPINE FINDINGS  Comminuted fracture of C1 involving the anterior arch, posterior arch and anterior aspect of the left lateral mass. Fractures are displaced, with outward expansion of the C1 ring. Multiple small fracture fragments are noted anteriorly around the odontoid process. Posterior and lateral displacement of the right lateral mass relative to the lateral mass of C2, and mild posterior subluxation of the left lateral mass relative to the lateral mass of C2. C2 itself appears intact. High attenuation material in the deep prevertebral soft tissues is compatible with hematoma. No additional acute displaced fractures of the cervical spine are noted. Severe multilevel degenerative disc disease,  most pronounced at C5-C6 and C6-C7. Multilevel facet arthropathy, including fusion on the left from C3-C5. Visualized portions of the upper thorax are unremarkable.  IMPRESSION: 1. Acute comminuted displaced fracture of C1 vertebra, with gross disruption of C1-C2 articulation, and large hematoma in the prevertebral soft tissues, as detailed above. 2. Soft tissue contusion and hematoma in the left frontal scalp and left periorbital regions. No acute displaced skull fracture or displaced facial bone fractures. However, the left mandibular condyle is dislocated anteriorly, and the right mandibular condyle is anteriorly subluxed. 3. No acute intracranial abnormalities. 4. Mild paranasal sinus disease, as above, without acute features. Critical Value/emergent results were called by telephone at the time of interpretation on 02/10/2014 at 4:42 AM to Dr. Blanchie Dessert, who verbally acknowledged these results.   Electronically Signed   By: Vinnie Langton M.D.   On: 02/10/2014 04:47     EKG Interpretation   Date/Time:  Friday February 10 2014 02:43:26 EDT Ventricular Rate:  74 PR Interval:  171 QRS Duration: 105 QT Interval:  441 QTC Calculation: 489 R Axis:   -38 Text Interpretation:  Sinus rhythm Incomplete RBBB and LAFB Abnormal  R-wave progression, early transition Left ventricular hypertrophy  Borderline prolonged QT interval No previous tracing Confirmed by Maryan Rued   MD, Loree Fee ([id number]) on 02/10/2014 3:12:18 AM      MDM   Final diagnoses:  Hypoglycemia  Jefferson fracture  Distal radius fracture, right    Patient had a hypoglycemic episode tonight and fell down the stairs. There was positive loss of consciousness and amnesia to the events. Patient woke up at the bottom of the stairs with an injury to his for head. Patient's only complaint is that he  is having difficulty breathing out of his nose. He is neurovascularly intact and able to move his arms and legs. Patient does take xarelto for  paf.  He last took a dose at 8:00 Thursday morning. He has no C-spine tenderness to palpation and new injury to his skull. Patient is able to completely open his mouth without trismus but does notice some pain of the right wrist. He can move his legs without difficulty and has no chest or abdominal tenderness.  CT of the head, C-spine and face done which shows acute comminuted displaced C1 fracture with a large prevertebral hematoma. Also found to have a distal radius fracture. On the CT of the face there was suggestion of mandible dislocation however patient has no pain in his jaw unable to completely open and close it without difficulty.  Blood sugars remained stable here and labs are unremarkable. Discussed CT findings with neurosurgery Dr. Sherwood Gambler and he will come and evaluate the patient but at this time feels that it is a stable fracture patient was placed in an Aspen collar. Will also speak with trauma surgery. Recommended treatment with Eppie Gibson for reversal of anticoagulation.  Ortho will be consulted and pt placed in splint.  CRITICAL CARE Performed by: Blanchie Dessert Total critical care time: 30 Critical care time was exclusive of separately billable procedures and treating other patients. Critical care was necessary to treat or prevent imminent or life-threatening deterioration. Critical care was time spent personally by me on the following activities: development of treatment plan with patient and/or surrogate as well as nursing, discussions with consultants, evaluation of patient's response to treatment, examination of patient, obtaining history from patient or surrogate, ordering and performing treatments and interventions, ordering and review of laboratory studies, ordering and review of radiographic studies, pulse oximetry and re-evaluation of patient's condition.   Blanchie Dessert, MD 02/10/14 [place], MD 02/10/14 [phone]

## 2014-02-10 NOTE — Consult Note (Signed)
Reason for Consult:  C1 fracture Referring Physician:  Dr. Blanchie Dessert  Curtis Dunn is an 73 y.o. right handed white male.  HPI: Patient reportedly had a fall associated with a hypoglycemic episode at 2130 last night. He denies any loss of consciousness. He apparently subsequently took some glucose tablets, and contact his son. He presented to the emergency room at about 0230 this morning and was evaluated by Dr. Blanchie Dessert (EDP). Trauma workup by Dr. Maryan Rued has revealed a right wrist fracture, dislocation of the mandible, a retropharyngeal hematoma, and a C1 fracture. Patient is being admitted by the trauma surgical service and multiple consultations have been requested including orthopedic surgery, maxillofacial surgery, and neurosurgery.  Patient describes mild neck discomfort, some of which he attributes to being slouched down in the bed. He is immobilized in a Aspen cervical collar. He does complain of difficulty with exhalation (he denies any difficulty with inhalation), as if there was a flapper over his upper airway blocking exhalation. He does not have any headache or weakness.  He reports a history of diabetes managed by Dr. Gareth Eagle, and paroxysmal atrial fibrillation managed by Dr. Darlin Coco. He is treated with Jennye Moccasin, and Dr. Maryan Rued has initiated reversal with Kcentra. He also reports a history of hypertension for which he is on 4 agents, and colon cancer, status post a colectomy by Dr. Magdalene River 10 years ago.  Patient is widowed, he is a former Insurance underwriter, he smoked a rare cigar in the remote past (none for at least 8-10 years). He does not drink alcoholic beverages.  Past Medical History:  Past Medical History  Diagnosis Date  . Palpitations 07/16/2005    normal nuclear study  . Tachycardia   . Diabetes mellitus   . Hypertension   . Hyperlipidemia   . Colon cancer     Past Surgical History:  Past Surgical History  Procedure Laterality Date  .  Appendectomy  2001  . Colon resection    . Elbow fracture surgery    . Arm surgery      Family History:  Family History  Problem Relation Age of Onset  . Heart disease Father     Social History:  reports that he has quit smoking. He does not have any smokeless tobacco history on file. His alcohol and drug histories are not on file.  Allergies: No Known Allergies  Medications: I have reviewed the patient's current medications.  ROS:  Notable for those difficulties described in his history of present illness and past medical history, but is otherwise unremarkable.  Physical Examination: Patient is a well-developed well-nourished white male, immobilized in a Aspen cervical collar, and with a right forearm and hand splint in place, but in no acute distress. Blood pressure 157/73, pulse 74, temperature 98.4 F (36.9 C), temperature source Oral, resp. rate 22, weight 80.3 kg (177 lb 0.5 oz), SpO2 100.00%. External: No battle sign, raccoon sign, or hemotympanum. Lungs:  Clear to auscultation, he has symmetrical respiratory excursion. Heart:  Regular rate and rhythm, normal S1 and S2. Abdomen:  Soft, nondistended, bowel sounds present. Extremity:  Right forearm and hand splint. Musculoskeletal:  Aspen cervical collar in place.  Neurological Examination: Mental Status Examination:  Awake and alert, oriented to his name, [place] Dunn, and 2015. Speech is fluent. He he is following commands. Cranial Nerve Examination:  Pupils equal, round, reactive to light, and about 3 mm bilaterally. EOMI. Facial sensation intact. Facial movements symmetrical. Hearing present bilaterally. Palatal movement symmetrical. Shoulder  shrug symmetrical. Tongue midline. Motor Examination:  Moves all 4 extremities with good strength, although testing of the distal right upper extremity is limited due to the splint. Sensory Examination:  Intact to pinprick throughout. Reflex Examination:   Diminished to absent  throughout the upper and lower extremities. Gait and Stance Examination:  Not tested due to the nature of his injury.   Results for orders placed during the Dunn encounter of 02/10/14 (from the past 48 hour(s))  CBG MONITORING, ED     Status: None   Collection Time    02/10/14  2:51 AM      Result Value Ref Range   Glucose-Capillary 86  70 - 99 mg/dL  CBC WITH DIFFERENTIAL     Status: Abnormal   Collection Time    02/10/14  3:35 AM      Result Value Ref Range   WBC 13.8 (*) 4.0 - 10.5 K/uL   RBC 4.22  4.22 - 5.81 MIL/uL   Hemoglobin 13.5  13.0 - 17.0 g/dL   HCT 39.1  39.0 - 52.0 %   MCV 92.7  78.0 - 100.0 fL   MCH 32.0  26.0 - 34.0 pg   MCHC 34.5  30.0 - 36.0 g/dL   RDW 12.9  11.5 - 15.5 %   Platelets 194  150 - 400 K/uL   Neutrophils Relative % 88 (*) 43 - 77 %   Neutro Abs 12.2 (*) 1.7 - 7.7 K/uL   Lymphocytes Relative 6 (*) 12 - 46 %   Lymphs Abs 0.8  0.7 - 4.0 K/uL   Monocytes Relative 6  3 - 12 %   Monocytes Absolute 0.8  0.1 - 1.0 K/uL   Eosinophils Relative 0  0 - 5 %   Eosinophils Absolute 0.0  0.0 - 0.7 K/uL   Basophils Relative 0  0 - 1 %   Basophils Absolute 0.0  0.0 - 0.1 K/uL  COMPREHENSIVE METABOLIC PANEL     Status: Abnormal   Collection Time    02/10/14  3:35 AM      Result Value Ref Range   Sodium 142  137 - 147 mEq/L   Potassium 3.9  3.7 - 5.3 mEq/L   Chloride 103  96 - 112 mEq/L   CO2 24  19 - 32 mEq/L   Glucose, Bld 152 (*) 70 - 99 mg/dL   BUN 23  6 - 23 mg/dL   Creatinine, Ser 1.09  0.50 - 1.35 mg/dL   Calcium 9.4  8.4 - 10.5 mg/dL   Total Protein 6.9  6.0 - 8.3 g/dL   Albumin 4.0  3.5 - 5.2 g/dL   AST 42 (*) 0 - 37 U/L   ALT 30  0 - 53 U/L   Alkaline Phosphatase 65  39 - 117 U/L   Total Bilirubin 0.6  0.3 - 1.2 mg/dL   GFR calc non Af Amer 66 (*) >90 mL/min   GFR calc Af Amer 76 (*) >90 mL/min   Comment: (NOTE)     The eGFR has been calculated using the CKD EPI equation.     This calculation has not been validated in all clinical  situations.     eGFR's persistently <90 mL/min signify possible Chronic Kidney     Disease.  PROTIME-INR     Status: None   Collection Time    02/10/14  3:35 AM      Result Value Ref Range   Prothrombin Time 13.6  11.6 - 15.2  seconds   INR 1.06  0.00 - 1.49  APTT     Status: None   Collection Time    02/10/14  3:35 AM      Result Value Ref Range   aPTT 28  24 - 37 seconds  URINALYSIS, ROUTINE W REFLEX MICROSCOPIC     Status: Abnormal   Collection Time    02/10/14  5:33 AM      Result Value Ref Range   Color, Urine YELLOW  YELLOW   APPearance CLEAR  CLEAR   Specific Gravity, Urine 1.021  1.005 - 1.030   pH 7.5  5.0 - 8.0   Glucose, UA NEGATIVE  NEGATIVE mg/dL   Hgb urine dipstick NEGATIVE  NEGATIVE   Bilirubin Urine NEGATIVE  NEGATIVE   Ketones, ur 40 (*) NEGATIVE mg/dL   Protein, ur NEGATIVE  NEGATIVE mg/dL   Urobilinogen, UA 0.2  0.0 - 1.0 mg/dL   Nitrite NEGATIVE  NEGATIVE   Leukocytes, UA NEGATIVE  NEGATIVE   Comment: MICROSCOPIC NOT DONE ON URINES WITH NEGATIVE PROTEIN, BLOOD, LEUKOCYTES, NITRITE, OR GLUCOSE <1000 mg/dL.    Dg Chest 2 View  02/10/2014   CLINICAL DATA:  History of trauma from a fall.  EXAM: CHEST  2 VIEW  COMPARISON:  No priors.  FINDINGS: Lung volumes are normal. No consolidative airspace disease. No pleural effusions. No pneumothorax. No pulmonary nodule or mass noted. Pulmonary vasculature and the cardiomediastinal silhouette are within normal limits.  IMPRESSION: 1.  No radiographic evidence of acute cardiopulmonary disease.   Electronically Signed   By: Vinnie Langton M.D.   On: 02/10/2014 04:13   Dg Wrist Complete Right  02/10/2014   CLINICAL DATA:  History of trauma from a fall complaining of right-sided wrist pain.  EXAM: RIGHT WRIST - COMPLETE 3+ VIEW  COMPARISON:  No priors.  FINDINGS: Minimally displaced fracture through the posterior aspect of the distal radius, which appears to extend through the posterior aspect of the articular surface at the  radiocarpal joint. Distal ulna appears intact. Overlying soft tissues appear markedly swollen. Carpals appear intact.  IMPRESSION: 1. Mildly displaced fracture of the posterior aspect of the distal radius with probable intra-articular extension, as above.   Electronically Signed   By: Vinnie Langton M.D.   On: 02/10/2014 04:12   Ct Head Wo Contrast  02/10/2014   CLINICAL DATA:  History of trauma from a fall.  EXAM: CT HEAD WITHOUT CONTRAST  CT MAXILLOFACIAL WITHOUT CONTRAST  CT CERVICAL SPINE WITHOUT CONTRAST  TECHNIQUE: Multidetector CT imaging of the head, cervical spine, and maxillofacial structures were performed using the standard protocol without intravenous contrast. Multiplanar CT image reconstructions of the cervical spine and maxillofacial structures were also generated.  COMPARISON:  No priors.  FINDINGS: CT HEAD FINDINGS  Extensive soft tissue swelling and high attenuation in the left frontal scalp, lateral aspect of the periorbital region, and overlying the left maxilla anteriorly and laterally, compatible with contusion/hematoma. No acute posttraumatic intracranial hemorrhage, definite signs of acute/subacute cerebral ischemia, mass or mass effect, hydrocephalus or other abnormal intra or extra-axial fluid collections. Mucosal thickening throughout the ethmoid and maxillary sinuses bilaterally. Small mucosal retention cysts or polyps in the right maxillary sinus. Mastoids are well pneumatized bilaterally.  CT MAXILLOFACIAL FINDINGS  The right mandibular condyle is mildly anteriorly subluxed, while the left mandibular condyles is dislocated anteriorly. The mandible appears intact. The acute displaced facial bone fractures. Pterygoid plates are intact. Extensive soft tissue swelling in the left cerebral regions. Left  globe and retro-orbital soft tissues are grossly normal in appearance.  CT CERVICAL SPINE FINDINGS  Comminuted fracture of C1 involving the anterior arch, posterior arch and anterior  aspect of the left lateral mass. Fractures are displaced, with outward expansion of the C1 ring. Multiple small fracture fragments are noted anteriorly around the odontoid process. Posterior and lateral displacement of the right lateral mass relative to the lateral mass of C2, and mild posterior subluxation of the left lateral mass relative to the lateral mass of C2. C2 itself appears intact. High attenuation material in the deep prevertebral soft tissues is compatible with hematoma. No additional acute displaced fractures of the cervical spine are noted. Severe multilevel degenerative disc disease, most pronounced at C5-C6 and C6-C7. Multilevel facet arthropathy, including fusion on the left from C3-C5. Visualized portions of the upper thorax are unremarkable.  IMPRESSION: 1. Acute comminuted displaced fracture of C1 vertebra, with gross disruption of C1-C2 articulation, and large hematoma in the prevertebral soft tissues, as detailed above. 2. Soft tissue contusion and hematoma in the left frontal scalp and left periorbital regions. No acute displaced skull fracture or displaced facial bone fractures. However, the left mandibular condyle is dislocated anteriorly, and the right mandibular condyle is anteriorly subluxed. 3. No acute intracranial abnormalities. 4. Mild paranasal sinus disease, as above, without acute features. Critical Value/emergent results were called by telephone at the time of interpretation on 02/10/2014 at 4:42 AM to Dr. Blanchie Dessert, who verbally acknowledged these results.   Electronically Signed   By: Vinnie Langton M.D.   On: 02/10/2014 04:47   Ct Cervical Spine Wo Contrast  02/10/2014   CLINICAL DATA:  History of trauma from a fall.  EXAM: CT HEAD WITHOUT CONTRAST  CT MAXILLOFACIAL WITHOUT CONTRAST  CT CERVICAL SPINE WITHOUT CONTRAST  TECHNIQUE: Multidetector CT imaging of the head, cervical spine, and maxillofacial structures were performed using the standard protocol without  intravenous contrast. Multiplanar CT image reconstructions of the cervical spine and maxillofacial structures were also generated.  COMPARISON:  No priors.  FINDINGS: CT HEAD FINDINGS  Extensive soft tissue swelling and high attenuation in the left frontal scalp, lateral aspect of the periorbital region, and overlying the left maxilla anteriorly and laterally, compatible with contusion/hematoma. No acute posttraumatic intracranial hemorrhage, definite signs of acute/subacute cerebral ischemia, mass or mass effect, hydrocephalus or other abnormal intra or extra-axial fluid collections. Mucosal thickening throughout the ethmoid and maxillary sinuses bilaterally. Small mucosal retention cysts or polyps in the right maxillary sinus. Mastoids are well pneumatized bilaterally.  CT MAXILLOFACIAL FINDINGS  The right mandibular condyle is mildly anteriorly subluxed, while the left mandibular condyles is dislocated anteriorly. The mandible appears intact. The acute displaced facial bone fractures. Pterygoid plates are intact. Extensive soft tissue swelling in the left cerebral regions. Left globe and retro-orbital soft tissues are grossly normal in appearance.  CT CERVICAL SPINE FINDINGS  Comminuted fracture of C1 involving the anterior arch, posterior arch and anterior aspect of the left lateral mass. Fractures are displaced, with outward expansion of the C1 ring. Multiple small fracture fragments are noted anteriorly around the odontoid process. Posterior and lateral displacement of the right lateral mass relative to the lateral mass of C2, and mild posterior subluxation of the left lateral mass relative to the lateral mass of C2. C2 itself appears intact. High attenuation material in the deep prevertebral soft tissues is compatible with hematoma. No additional acute displaced fractures of the cervical spine are noted. Severe multilevel degenerative disc disease, most pronounced  at C5-C6 and C6-C7. Multilevel facet  arthropathy, including fusion on the left from C3-C5. Visualized portions of the upper thorax are unremarkable.  IMPRESSION: 1. Acute comminuted displaced fracture of C1 vertebra, with gross disruption of C1-C2 articulation, and large hematoma in the prevertebral soft tissues, as detailed above. 2. Soft tissue contusion and hematoma in the left frontal scalp and left periorbital regions. No acute displaced skull fracture or displaced facial bone fractures. However, the left mandibular condyle is dislocated anteriorly, and the right mandibular condyle is anteriorly subluxed. 3. No acute intracranial abnormalities. 4. Mild paranasal sinus disease, as above, without acute features. Critical Value/emergent results were called by telephone at the time of interpretation on 02/10/2014 at 4:42 AM to Dr. Blanchie Dessert, who verbally acknowledged these results.   Electronically Signed   By: Vinnie Langton M.D.   On: 02/10/2014 04:47   Ct Maxillofacial Wo Cm  02/10/2014   CLINICAL DATA:  History of trauma from a fall.  EXAM: CT HEAD WITHOUT CONTRAST  CT MAXILLOFACIAL WITHOUT CONTRAST  CT CERVICAL SPINE WITHOUT CONTRAST  TECHNIQUE: Multidetector CT imaging of the head, cervical spine, and maxillofacial structures were performed using the standard protocol without intravenous contrast. Multiplanar CT image reconstructions of the cervical spine and maxillofacial structures were also generated.  COMPARISON:  No priors.  FINDINGS: CT HEAD FINDINGS  Extensive soft tissue swelling and high attenuation in the left frontal scalp, lateral aspect of the periorbital region, and overlying the left maxilla anteriorly and laterally, compatible with contusion/hematoma. No acute posttraumatic intracranial hemorrhage, definite signs of acute/subacute cerebral ischemia, mass or mass effect, hydrocephalus or other abnormal intra or extra-axial fluid collections. Mucosal thickening throughout the ethmoid and maxillary sinuses bilaterally.  Small mucosal retention cysts or polyps in the right maxillary sinus. Mastoids are well pneumatized bilaterally.  CT MAXILLOFACIAL FINDINGS  The right mandibular condyle is mildly anteriorly subluxed, while the left mandibular condyles is dislocated anteriorly. The mandible appears intact. The acute displaced facial bone fractures. Pterygoid plates are intact. Extensive soft tissue swelling in the left cerebral regions. Left globe and retro-orbital soft tissues are grossly normal in appearance.  CT CERVICAL SPINE FINDINGS  Comminuted fracture of C1 involving the anterior arch, posterior arch and anterior aspect of the left lateral mass. Fractures are displaced, with outward expansion of the C1 ring. Multiple small fracture fragments are noted anteriorly around the odontoid process. Posterior and lateral displacement of the right lateral mass relative to the lateral mass of C2, and mild posterior subluxation of the left lateral mass relative to the lateral mass of C2. C2 itself appears intact. High attenuation material in the deep prevertebral soft tissues is compatible with hematoma. No additional acute displaced fractures of the cervical spine are noted. Severe multilevel degenerative disc disease, most pronounced at C5-C6 and C6-C7. Multilevel facet arthropathy, including fusion on the left from C3-C5. Visualized portions of the upper thorax are unremarkable.  IMPRESSION: 1. Acute comminuted displaced fracture of C1 vertebra, with gross disruption of C1-C2 articulation, and large hematoma in the prevertebral soft tissues, as detailed above. 2. Soft tissue contusion and hematoma in the left frontal scalp and left periorbital regions. No acute displaced skull fracture or displaced facial bone fractures. However, the left mandibular condyle is dislocated anteriorly, and the right mandibular condyle is anteriorly subluxed. 3. No acute intracranial abnormalities. 4. Mild paranasal sinus disease, as above, without acute  features. Critical Value/emergent results were called by telephone at the time of interpretation on 02/10/2014 at 4:42 AM  to Dr. Blanchie Dessert, who verbally acknowledged these results.   Electronically Signed   By: Vinnie Langton M.D.   On: 02/10/2014 04:47     Assessment/Plan: Patient with a multiple trauma including a C1 fracture. The patient does describe mild neck discomfort, he is immobilized in a Aspen cervical collar. Other injuries include a right wrist fracture, mandibular dislocation, and a retropharyngeal hematoma. Notably the patient is diabetic, and has paroxysmal atrial fibrillation, for which he was taking Xaralto. Patient is being admitted by the trauma surgical service for multiple trauma.  From a neurosurgical perspective the patient will require immobilization in the Aspen cervical collar for at least 3 months. I will need to see him in the office for followup in about one month with x-rays. I will be out-of-town until Monday, April 6, and I will assign his ongoing neurosurgical care and followup, until then, to Dr. Consuella Lose.  Hosie Spangle, MD 02/10/2014, 6:40 AM

## 2014-02-10 NOTE — ED Notes (Signed)
Ortho at bedside. States splint is appropriate and to reduce swelling increase the elevation placed pillow under right arm and towels to keep arm elevated. Patient verbalized understanding.

## 2014-02-10 NOTE — ED Notes (Signed)
Ortho Tech called to inform of need for splint.

## 2014-02-10 NOTE — ED Provider Notes (Signed)
LACERATION REPAIR Performed by: Jamse Mead Authorized by: Jamse Mead Consent: Verbal consent obtained. Risks and benefits: risks, benefits and alternatives were discussed Consent given by: patient Patient identity confirmed: provided demographic data Prepped and Draped in normal sterile fashion Wound explored Laceration Location: forehrad Laceration Length: 9-10 cm No Foreign Bodies seen or palpated Anesthesia: local infiltration Local anesthetic: lidocaine 2% with epinephrine Anesthetic total: 6-7 ml Irrigation method: buld Amount of cleaning: standard Skin closure: approximate Number of sutures: 23 Technique: single interrupted, 6-0 prolene Patient tolerance: Patient tolerated the procedure well with no immediate complications. Good hemostasis during procedure.    Jamse Mead, PA-C 02/10/14 1835

## 2014-02-10 NOTE — ED Notes (Signed)
Ortho Tech at Bedside.  

## 2014-02-10 NOTE — ED Notes (Signed)
Myself, Marya Amsler, RN and nursing student readjusted pt on stretcher

## 2014-02-10 NOTE — H&P (Signed)
History   Curtis Dunn is an 73 y.o. male.   Chief Complaint:  Chief Complaint  Patient presents with  . Fall    Fall This is a new problem. The current episode started today. Associated symptoms include neck pain and weakness. Pertinent negatives include no numbness.   Pt fell down stairs.  Hit head at bottom.  Amnestic to event.  C/o pain in right wrist,  Neck and forehead. Denies abdominal pain ,  Chest pain or lower extremity pain.  Past Medical History  Diagnosis Date  . Palpitations 07/16/2005    normal nuclear study  . Tachycardia   . Diabetes mellitus   . Hypertension   . Hyperlipidemia   . Colon cancer     Past Surgical History  Procedure Laterality Date  . Appendectomy  2001  . Colon resection    . Elbow fracture surgery    . Arm surgery      Family History  Problem Relation Age of Onset  . Heart disease Father    Social History:  reports that he has quit smoking. He does not have any smokeless tobacco history on file. His alcohol and drug histories are not on file.  Allergies  No Known Allergies  Home Medications   (Not in a hospital admission)  Trauma Course   Results for orders placed during the hospital encounter of 02/10/14 (from the past 48 hour(s))  CBG MONITORING, ED     Status: None   Collection Time    02/10/14  2:51 AM      Result Value Ref Range   Glucose-Capillary 86  70 - 99 mg/dL  CBC WITH DIFFERENTIAL     Status: Abnormal   Collection Time    02/10/14  3:35 AM      Result Value Ref Range   WBC 13.8 (*) 4.0 - 10.5 K/uL   RBC 4.22  4.22 - 5.81 MIL/uL   Hemoglobin 13.5  13.0 - 17.0 g/dL   HCT 39.1  39.0 - 52.0 %   MCV 92.7  78.0 - 100.0 fL   MCH 32.0  26.0 - 34.0 pg   MCHC 34.5  30.0 - 36.0 g/dL   RDW 12.9  11.5 - 15.5 %   Platelets 194  150 - 400 K/uL   Neutrophils Relative % 88 (*) 43 - 77 %   Neutro Abs 12.2 (*) 1.7 - 7.7 K/uL   Lymphocytes Relative 6 (*) 12 - 46 %   Lymphs Abs 0.8  0.7 - 4.0 K/uL   Monocytes  Relative 6  3 - 12 %   Monocytes Absolute 0.8  0.1 - 1.0 K/uL   Eosinophils Relative 0  0 - 5 %   Eosinophils Absolute 0.0  0.0 - 0.7 K/uL   Basophils Relative 0  0 - 1 %   Basophils Absolute 0.0  0.0 - 0.1 K/uL  COMPREHENSIVE METABOLIC PANEL     Status: Abnormal   Collection Time    02/10/14  3:35 AM      Result Value Ref Range   Sodium 142  137 - 147 mEq/L   Potassium 3.9  3.7 - 5.3 mEq/L   Chloride 103  96 - 112 mEq/L   CO2 24  19 - 32 mEq/L   Glucose, Bld 152 (*) 70 - 99 mg/dL   BUN 23  6 - 23 mg/dL   Creatinine, Ser 1.09  0.50 - 1.35 mg/dL   Calcium 9.4  8.4 - 10.5 mg/dL  Total Protein 6.9  6.0 - 8.3 g/dL   Albumin 4.0  3.5 - 5.2 g/dL   AST 42 (*) 0 - 37 U/L   ALT 30  0 - 53 U/L   Alkaline Phosphatase 65  39 - 117 U/L   Total Bilirubin 0.6  0.3 - 1.2 mg/dL   GFR calc non Af Amer 66 (*) >90 mL/min   GFR calc Af Amer 76 (*) >90 mL/min   Comment: (NOTE)     The eGFR has been calculated using the CKD EPI equation.     This calculation has not been validated in all clinical situations.     eGFR's persistently <90 mL/min signify possible Chronic Kidney     Disease.  PROTIME-INR     Status: None   Collection Time    02/10/14  3:35 AM      Result Value Ref Range   Prothrombin Time 13.6  11.6 - 15.2 seconds   INR 1.06  0.00 - 1.49  APTT     Status: None   Collection Time    02/10/14  3:35 AM      Result Value Ref Range   aPTT 28  24 - 37 seconds  URINALYSIS, ROUTINE W REFLEX MICROSCOPIC     Status: Abnormal   Collection Time    02/10/14  5:33 AM      Result Value Ref Range   Color, Urine YELLOW  YELLOW   APPearance CLEAR  CLEAR   Specific Gravity, Urine 1.021  1.005 - 1.030   pH 7.5  5.0 - 8.0   Glucose, UA NEGATIVE  NEGATIVE mg/dL   Hgb urine dipstick NEGATIVE  NEGATIVE   Bilirubin Urine NEGATIVE  NEGATIVE   Ketones, ur 40 (*) NEGATIVE mg/dL   Protein, ur NEGATIVE  NEGATIVE mg/dL   Urobilinogen, UA 0.2  0.0 - 1.0 mg/dL   Nitrite NEGATIVE  NEGATIVE   Leukocytes,  UA NEGATIVE  NEGATIVE   Comment: MICROSCOPIC NOT DONE ON URINES WITH NEGATIVE PROTEIN, BLOOD, LEUKOCYTES, NITRITE, OR GLUCOSE <1000 mg/dL.   Dg Chest 2 View  02/10/2014   CLINICAL DATA:  History of trauma from a fall.  EXAM: CHEST  2 VIEW  COMPARISON:  No priors.  FINDINGS: Lung volumes are normal. No consolidative airspace disease. No pleural effusions. No pneumothorax. No pulmonary nodule or mass noted. Pulmonary vasculature and the cardiomediastinal silhouette are within normal limits.  IMPRESSION: 1.  No radiographic evidence of acute cardiopulmonary disease.   Electronically Signed   By: Vinnie Langton M.D.   On: 02/10/2014 04:13   Dg Wrist Complete Right  02/10/2014   CLINICAL DATA:  History of trauma from a fall complaining of right-sided wrist pain.  EXAM: RIGHT WRIST - COMPLETE 3+ VIEW  COMPARISON:  No priors.  FINDINGS: Minimally displaced fracture through the posterior aspect of the distal radius, which appears to extend through the posterior aspect of the articular surface at the radiocarpal joint. Distal ulna appears intact. Overlying soft tissues appear markedly swollen. Carpals appear intact.  IMPRESSION: 1. Mildly displaced fracture of the posterior aspect of the distal radius with probable intra-articular extension, as above.   Electronically Signed   By: Vinnie Langton M.D.   On: 02/10/2014 04:12   Ct Head Wo Contrast  02/10/2014   CLINICAL DATA:  History of trauma from a fall.  EXAM: CT HEAD WITHOUT CONTRAST  CT MAXILLOFACIAL WITHOUT CONTRAST  CT CERVICAL SPINE WITHOUT CONTRAST  TECHNIQUE: Multidetector CT imaging of the head, cervical  spine, and maxillofacial structures were performed using the standard protocol without intravenous contrast. Multiplanar CT image reconstructions of the cervical spine and maxillofacial structures were also generated.  COMPARISON:  No priors.  FINDINGS: CT HEAD FINDINGS  Extensive soft tissue swelling and high attenuation in the left frontal scalp,  lateral aspect of the periorbital region, and overlying the left maxilla anteriorly and laterally, compatible with contusion/hematoma. No acute posttraumatic intracranial hemorrhage, definite signs of acute/subacute cerebral ischemia, mass or mass effect, hydrocephalus or other abnormal intra or extra-axial fluid collections. Mucosal thickening throughout the ethmoid and maxillary sinuses bilaterally. Small mucosal retention cysts or polyps in the right maxillary sinus. Mastoids are well pneumatized bilaterally.  CT MAXILLOFACIAL FINDINGS  The right mandibular condyle is mildly anteriorly subluxed, while the left mandibular condyles is dislocated anteriorly. The mandible appears intact. The acute displaced facial bone fractures. Pterygoid plates are intact. Extensive soft tissue swelling in the left cerebral regions. Left globe and retro-orbital soft tissues are grossly normal in appearance.  CT CERVICAL SPINE FINDINGS  Comminuted fracture of C1 involving the anterior arch, posterior arch and anterior aspect of the left lateral mass. Fractures are displaced, with outward expansion of the C1 ring. Multiple small fracture fragments are noted anteriorly around the odontoid process. Posterior and lateral displacement of the right lateral mass relative to the lateral mass of C2, and mild posterior subluxation of the left lateral mass relative to the lateral mass of C2. C2 itself appears intact. High attenuation material in the deep prevertebral soft tissues is compatible with hematoma. No additional acute displaced fractures of the cervical spine are noted. Severe multilevel degenerative disc disease, most pronounced at C5-C6 and C6-C7. Multilevel facet arthropathy, including fusion on the left from C3-C5. Visualized portions of the upper thorax are unremarkable.  IMPRESSION: 1. Acute comminuted displaced fracture of C1 vertebra, with gross disruption of C1-C2 articulation, and large hematoma in the prevertebral soft  tissues, as detailed above. 2. Soft tissue contusion and hematoma in the left frontal scalp and left periorbital regions. No acute displaced skull fracture or displaced facial bone fractures. However, the left mandibular condyle is dislocated anteriorly, and the right mandibular condyle is anteriorly subluxed. 3. No acute intracranial abnormalities. 4. Mild paranasal sinus disease, as above, without acute features. Critical Value/emergent results were called by telephone at the time of interpretation on 02/10/2014 at 4:42 AM to Dr. Blanchie Dessert, who verbally acknowledged these results.   Electronically Signed   By: Vinnie Langton M.D.   On: 02/10/2014 04:47   Ct Cervical Spine Wo Contrast  02/10/2014   CLINICAL DATA:  History of trauma from a fall.  EXAM: CT HEAD WITHOUT CONTRAST  CT MAXILLOFACIAL WITHOUT CONTRAST  CT CERVICAL SPINE WITHOUT CONTRAST  TECHNIQUE: Multidetector CT imaging of the head, cervical spine, and maxillofacial structures were performed using the standard protocol without intravenous contrast. Multiplanar CT image reconstructions of the cervical spine and maxillofacial structures were also generated.  COMPARISON:  No priors.  FINDINGS: CT HEAD FINDINGS  Extensive soft tissue swelling and high attenuation in the left frontal scalp, lateral aspect of the periorbital region, and overlying the left maxilla anteriorly and laterally, compatible with contusion/hematoma. No acute posttraumatic intracranial hemorrhage, definite signs of acute/subacute cerebral ischemia, mass or mass effect, hydrocephalus or other abnormal intra or extra-axial fluid collections. Mucosal thickening throughout the ethmoid and maxillary sinuses bilaterally. Small mucosal retention cysts or polyps in the right maxillary sinus. Mastoids are well pneumatized bilaterally.  CT MAXILLOFACIAL FINDINGS  The  right mandibular condyle is mildly anteriorly subluxed, while the left mandibular condyles is dislocated anteriorly.  The mandible appears intact. The acute displaced facial bone fractures. Pterygoid plates are intact. Extensive soft tissue swelling in the left cerebral regions. Left globe and retro-orbital soft tissues are grossly normal in appearance.  CT CERVICAL SPINE FINDINGS  Comminuted fracture of C1 involving the anterior arch, posterior arch and anterior aspect of the left lateral mass. Fractures are displaced, with outward expansion of the C1 ring. Multiple small fracture fragments are noted anteriorly around the odontoid process. Posterior and lateral displacement of the right lateral mass relative to the lateral mass of C2, and mild posterior subluxation of the left lateral mass relative to the lateral mass of C2. C2 itself appears intact. High attenuation material in the deep prevertebral soft tissues is compatible with hematoma. No additional acute displaced fractures of the cervical spine are noted. Severe multilevel degenerative disc disease, most pronounced at C5-C6 and C6-C7. Multilevel facet arthropathy, including fusion on the left from C3-C5. Visualized portions of the upper thorax are unremarkable.  IMPRESSION: 1. Acute comminuted displaced fracture of C1 vertebra, with gross disruption of C1-C2 articulation, and large hematoma in the prevertebral soft tissues, as detailed above. 2. Soft tissue contusion and hematoma in the left frontal scalp and left periorbital regions. No acute displaced skull fracture or displaced facial bone fractures. However, the left mandibular condyle is dislocated anteriorly, and the right mandibular condyle is anteriorly subluxed. 3. No acute intracranial abnormalities. 4. Mild paranasal sinus disease, as above, without acute features. Critical Value/emergent results were called by telephone at the time of interpretation on 02/10/2014 at 4:42 AM to Dr. Blanchie Dessert, who verbally acknowledged these results.   Electronically Signed   By: Vinnie Langton M.D.   On: 02/10/2014 04:47    Ct Maxillofacial Wo Cm  02/10/2014   CLINICAL DATA:  History of trauma from a fall.  EXAM: CT HEAD WITHOUT CONTRAST  CT MAXILLOFACIAL WITHOUT CONTRAST  CT CERVICAL SPINE WITHOUT CONTRAST  TECHNIQUE: Multidetector CT imaging of the head, cervical spine, and maxillofacial structures were performed using the standard protocol without intravenous contrast. Multiplanar CT image reconstructions of the cervical spine and maxillofacial structures were also generated.  COMPARISON:  No priors.  FINDINGS: CT HEAD FINDINGS  Extensive soft tissue swelling and high attenuation in the left frontal scalp, lateral aspect of the periorbital region, and overlying the left maxilla anteriorly and laterally, compatible with contusion/hematoma. No acute posttraumatic intracranial hemorrhage, definite signs of acute/subacute cerebral ischemia, mass or mass effect, hydrocephalus or other abnormal intra or extra-axial fluid collections. Mucosal thickening throughout the ethmoid and maxillary sinuses bilaterally. Small mucosal retention cysts or polyps in the right maxillary sinus. Mastoids are well pneumatized bilaterally.  CT MAXILLOFACIAL FINDINGS  The right mandibular condyle is mildly anteriorly subluxed, while the left mandibular condyles is dislocated anteriorly. The mandible appears intact. The acute displaced facial bone fractures. Pterygoid plates are intact. Extensive soft tissue swelling in the left cerebral regions. Left globe and retro-orbital soft tissues are grossly normal in appearance.  CT CERVICAL SPINE FINDINGS  Comminuted fracture of C1 involving the anterior arch, posterior arch and anterior aspect of the left lateral mass. Fractures are displaced, with outward expansion of the C1 ring. Multiple small fracture fragments are noted anteriorly around the odontoid process. Posterior and lateral displacement of the right lateral mass relative to the lateral mass of C2, and mild posterior subluxation of the left lateral  mass relative to the  lateral mass of C2. C2 itself appears intact. High attenuation material in the deep prevertebral soft tissues is compatible with hematoma. No additional acute displaced fractures of the cervical spine are noted. Severe multilevel degenerative disc disease, most pronounced at C5-C6 and C6-C7. Multilevel facet arthropathy, including fusion on the left from C3-C5. Visualized portions of the upper thorax are unremarkable.  IMPRESSION: 1. Acute comminuted displaced fracture of C1 vertebra, with gross disruption of C1-C2 articulation, and large hematoma in the prevertebral soft tissues, as detailed above. 2. Soft tissue contusion and hematoma in the left frontal scalp and left periorbital regions. No acute displaced skull fracture or displaced facial bone fractures. However, the left mandibular condyle is dislocated anteriorly, and the right mandibular condyle is anteriorly subluxed. 3. No acute intracranial abnormalities. 4. Mild paranasal sinus disease, as above, without acute features. Critical Value/emergent results were called by telephone at the time of interpretation on 02/10/2014 at 4:42 AM to Dr. Blanchie Dessert, who verbally acknowledged these results.   Electronically Signed   By: Vinnie Langton M.D.   On: 02/10/2014 04:47    Review of Systems  HENT: Negative.   Respiratory: Negative.   Cardiovascular: Negative.   Gastrointestinal: Negative.   Musculoskeletal: Positive for neck pain.  Neurological: Positive for loss of consciousness and weakness. Negative for numbness.    Blood pressure 157/73, pulse 74, temperature 98.4 F (36.9 C), temperature source Oral, resp. rate 22, weight 177 lb 0.5 oz (80.3 kg), SpO2 100.00%. Physical Exam  Constitutional: He is oriented to person, place, and time. He appears well-developed and well-nourished.  HENT:  Head: Head is with laceration.    Right Ear: Ear canal normal.  Left Ear: Ear canal normal.  Eyes: EOM are normal. Pupils are  equal, round, and reactive to light.  Neck:  In c collar  Cardiovascular: Normal rate and regular rhythm.   Respiratory: Effort normal and breath sounds normal.  GI: Soft. Bowel sounds are normal. He exhibits no distension.  Musculoskeletal:       Right wrist: He exhibits tenderness, bony tenderness and swelling.  Neurological: He is alert and oriented to person, place, and time. He has normal strength. No sensory deficit. GCS eye subscore is 4. GCS verbal subscore is 5. GCS motor subscore is 6.  Skin: Skin is warm and dry.  Psychiatric: He has a normal mood and affect. His behavior is normal. Judgment and thought content normal.     Assessment/Plan Fall PAF  on xaralto RIGHT distal radius fracture  Dr Apolonio Schneiders C1 fracture / C1 C2 dislocation  Dr Ileene Rubens Mandible dislocation by CT but clinically  Normal exam.  Will need face consult Facial laceration needs face consultation Jennye Moccasin being treated with concentrate. Neurologically intact.   Andoni Busch A. 02/10/2014, 6:36 AM   Procedures

## 2014-02-10 NOTE — ED Notes (Signed)
Pt c/o "tightness" in R hand splint.  Cap refill <2, skin warm to touch.

## 2014-02-10 NOTE — ED Notes (Signed)
Pt refuses Foley.  Is currently trying to use urinal

## 2014-02-10 NOTE — ED Notes (Signed)
Ortho paged.Spoke with to reassess R splint.  Will arrive in ED shortly.

## 2014-02-10 NOTE — ED Notes (Signed)
Pain currently 6-8/10 achy sharp neck posterior head pain prior to pain medication administration.

## 2014-02-10 NOTE — ED Notes (Signed)
Called flow manager currently waiting for step down bed.

## 2014-02-10 NOTE — Consult Note (Signed)
Curtis Dunn, Curtis Dunn 73 y.o., male [id number]     Chief Complaint: facial trauma  HPI: 73 yo wm diabetic had hypoglycemia and fell down stairs.  Sustained LEFT forehead laceration, C1 fx, RIGHT hand/wrist fx's.  In hard collar.  Splint on RIGHT hand.  CT reported slight sinus thickening.  Also TMJ dislocation.  Pt. Has no mouth or jaw symptoms, no malocclusion, no pain.  Hearing, vision OK.  I reviewed the CT scans.  Sinus disease is incidental and minor/trivial.    Pt does complain of nasal obstruction.  CT shows retropharyngeal swelling from C-spine fx.  PMH: Past Medical History  Diagnosis Date  . Palpitations 07/16/2005    normal nuclear study  . Tachycardia   . Diabetes mellitus   . Hypertension   . Hyperlipidemia   . Colon cancer     Surg Hx: Past Surgical History  Procedure Laterality Date  . Appendectomy  2001  . Colon resection    . Elbow fracture surgery    . Arm surgery      FHx:   Family History  Problem Relation Age of Onset  . Heart disease Father    SocHx:  reports that he has quit smoking. He does not have any smokeless tobacco history on file. His alcohol and drug histories are not on file.  ALLERGIES: No Known Allergies   (Not in a hospital admission)  Results for orders placed during the hospital encounter of 02/10/14 (from the past 48 hour(s))  CBG MONITORING, ED     Status: None   Collection Time    02/10/14  2:51 AM      Result Value Ref Range   Glucose-Capillary 86  70 - 99 mg/dL  CBC WITH DIFFERENTIAL     Status: Abnormal   Collection Time    02/10/14  3:35 AM      Result Value Ref Range   WBC 13.8 (*) 4.0 - 10.5 K/uL   RBC 4.22  4.22 - 5.81 MIL/uL   Hemoglobin 13.5  13.0 - 17.0 g/dL   HCT 39.1  39.0 - 52.0 %   MCV 92.7  78.0 - 100.0 fL   MCH 32.0  26.0 - 34.0 pg   MCHC 34.5  30.0 - 36.0 g/dL   RDW 12.9  11.5 - 15.5 %   Platelets 194  150 - 400 K/uL   Neutrophils Relative % 88 (*) 43 - 77 %   Neutro Abs 12.2 (*) 1.7 - 7.7 K/uL    Lymphocytes Relative 6 (*) 12 - 46 %   Lymphs Abs 0.8  0.7 - 4.0 K/uL   Monocytes Relative 6  3 - 12 %   Monocytes Absolute 0.8  0.1 - 1.0 K/uL   Eosinophils Relative 0  0 - 5 %   Eosinophils Absolute 0.0  0.0 - 0.7 K/uL   Basophils Relative 0  0 - 1 %   Basophils Absolute 0.0  0.0 - 0.1 K/uL  COMPREHENSIVE METABOLIC PANEL     Status: Abnormal   Collection Time    02/10/14  3:35 AM      Result Value Ref Range   Sodium 142  137 - 147 mEq/L   Potassium 3.9  3.7 - 5.3 mEq/L   Chloride 103  96 - 112 mEq/L   CO2 24  19 - 32 mEq/L   Glucose, Bld 152 (*) 70 - 99 mg/dL   BUN 23  6 - 23 mg/dL   Creatinine, Ser 1.09  0.50 -  1.35 mg/dL   Calcium 9.4  8.4 - 10.5 mg/dL   Total Protein 6.9  6.0 - 8.3 g/dL   Albumin 4.0  3.5 - 5.2 g/dL   AST 42 (*) 0 - 37 U/L   ALT 30  0 - 53 U/L   Alkaline Phosphatase 65  39 - 117 U/L   Total Bilirubin 0.6  0.3 - 1.2 mg/dL   GFR calc non Af Amer 66 (*) >90 mL/min   GFR calc Af Amer 76 (*) >90 mL/min   Comment: (NOTE)     The eGFR has been calculated using the CKD EPI equation.     This calculation has not been validated in all clinical situations.     eGFR's persistently <90 mL/min signify possible Chronic Kidney     Disease.  PROTIME-INR     Status: None   Collection Time    02/10/14  3:35 AM      Result Value Ref Range   Prothrombin Time 13.6  11.6 - 15.2 seconds   INR 1.06  0.00 - 1.49  APTT     Status: None   Collection Time    02/10/14  3:35 AM      Result Value Ref Range   aPTT 28  24 - 37 seconds  URINALYSIS, ROUTINE W REFLEX MICROSCOPIC     Status: Abnormal   Collection Time    02/10/14  5:33 AM      Result Value Ref Range   Color, Urine YELLOW  YELLOW   APPearance CLEAR  CLEAR   Specific Gravity, Urine 1.021  1.005 - 1.030   pH 7.5  5.0 - 8.0   Glucose, UA NEGATIVE  NEGATIVE mg/dL   Hgb urine dipstick NEGATIVE  NEGATIVE   Bilirubin Urine NEGATIVE  NEGATIVE   Ketones, ur 40 (*) NEGATIVE mg/dL   Protein, ur NEGATIVE  NEGATIVE mg/dL    Urobilinogen, UA 0.2  0.0 - 1.0 mg/dL   Nitrite NEGATIVE  NEGATIVE   Leukocytes, UA NEGATIVE  NEGATIVE   Comment: MICROSCOPIC NOT DONE ON URINES WITH NEGATIVE PROTEIN, BLOOD, LEUKOCYTES, NITRITE, OR GLUCOSE <1000 mg/dL.  CBG MONITORING, ED     Status: Abnormal   Collection Time    02/10/14  8:35 AM      Result Value Ref Range   Glucose-Capillary 229 (*) 70 - 99 mg/dL   Dg Chest 2 View  02/10/2014   CLINICAL DATA:  History of trauma from a fall.  EXAM: CHEST  2 VIEW  COMPARISON:  No priors.  FINDINGS: Lung volumes are normal. No consolidative airspace disease. No pleural effusions. No pneumothorax. No pulmonary nodule or mass noted. Pulmonary vasculature and the cardiomediastinal silhouette are within normal limits.  IMPRESSION: 1.  No radiographic evidence of acute cardiopulmonary disease.   Electronically Signed   By: Vinnie Langton M.D.   On: 02/10/2014 04:13   Dg Wrist Complete Right  02/10/2014   CLINICAL DATA:  History of trauma from a fall complaining of right-sided wrist pain.  EXAM: RIGHT WRIST - COMPLETE 3+ VIEW  COMPARISON:  No priors.  FINDINGS: Minimally displaced fracture through the posterior aspect of the distal radius, which appears to extend through the posterior aspect of the articular surface at the radiocarpal joint. Distal ulna appears intact. Overlying soft tissues appear markedly swollen. Carpals appear intact.  IMPRESSION: 1. Mildly displaced fracture of the posterior aspect of the distal radius with probable intra-articular extension, as above.   Electronically Signed   By: Quillian Quince  Entrikin M.D.   On: 02/10/2014 04:12   Ct Head Wo Contrast  02/10/2014   CLINICAL DATA:  History of trauma from a fall.  EXAM: CT HEAD WITHOUT CONTRAST  CT MAXILLOFACIAL WITHOUT CONTRAST  CT CERVICAL SPINE WITHOUT CONTRAST  TECHNIQUE: Multidetector CT imaging of the head, cervical spine, and maxillofacial structures were performed using the standard protocol without intravenous contrast.  Multiplanar CT image reconstructions of the cervical spine and maxillofacial structures were also generated.  COMPARISON:  No priors.  FINDINGS: CT HEAD FINDINGS  Extensive soft tissue swelling and high attenuation in the left frontal scalp, lateral aspect of the periorbital region, and overlying the left maxilla anteriorly and laterally, compatible with contusion/hematoma. No acute posttraumatic intracranial hemorrhage, definite signs of acute/subacute cerebral ischemia, mass or mass effect, hydrocephalus or other abnormal intra or extra-axial fluid collections. Mucosal thickening throughout the ethmoid and maxillary sinuses bilaterally. Small mucosal retention cysts or polyps in the right maxillary sinus. Mastoids are well pneumatized bilaterally.  CT MAXILLOFACIAL FINDINGS  The right mandibular condyle is mildly anteriorly subluxed, while the left mandibular condyles is dislocated anteriorly. The mandible appears intact. The acute displaced facial bone fractures. Pterygoid plates are intact. Extensive soft tissue swelling in the left cerebral regions. Left globe and retro-orbital soft tissues are grossly normal in appearance.  CT CERVICAL SPINE FINDINGS  Comminuted fracture of C1 involving the anterior arch, posterior arch and anterior aspect of the left lateral mass. Fractures are displaced, with outward expansion of the C1 ring. Multiple small fracture fragments are noted anteriorly around the odontoid process. Posterior and lateral displacement of the right lateral mass relative to the lateral mass of C2, and mild posterior subluxation of the left lateral mass relative to the lateral mass of C2. C2 itself appears intact. High attenuation material in the deep prevertebral soft tissues is compatible with hematoma. No additional acute displaced fractures of the cervical spine are noted. Severe multilevel degenerative disc disease, most pronounced at C5-C6 and C6-C7. Multilevel facet arthropathy, including fusion  on the left from C3-C5. Visualized portions of the upper thorax are unremarkable.  IMPRESSION: 1. Acute comminuted displaced fracture of C1 vertebra, with gross disruption of C1-C2 articulation, and large hematoma in the prevertebral soft tissues, as detailed above. 2. Soft tissue contusion and hematoma in the left frontal scalp and left periorbital regions. No acute displaced skull fracture or displaced facial bone fractures. However, the left mandibular condyle is dislocated anteriorly, and the right mandibular condyle is anteriorly subluxed. 3. No acute intracranial abnormalities. 4. Mild paranasal sinus disease, as above, without acute features. Critical Value/emergent results were called by telephone at the time of interpretation on 02/10/2014 at 4:42 AM to Dr. Blanchie Dessert, who verbally acknowledged these results.   Electronically Signed   By: Vinnie Langton M.D.   On: 02/10/2014 04:47   Ct Cervical Spine Wo Contrast  02/10/2014   CLINICAL DATA:  History of trauma from a fall.  EXAM: CT HEAD WITHOUT CONTRAST  CT MAXILLOFACIAL WITHOUT CONTRAST  CT CERVICAL SPINE WITHOUT CONTRAST  TECHNIQUE: Multidetector CT imaging of the head, cervical spine, and maxillofacial structures were performed using the standard protocol without intravenous contrast. Multiplanar CT image reconstructions of the cervical spine and maxillofacial structures were also generated.  COMPARISON:  No priors.  FINDINGS: CT HEAD FINDINGS  Extensive soft tissue swelling and high attenuation in the left frontal scalp, lateral aspect of the periorbital region, and overlying the left maxilla anteriorly and laterally, compatible with contusion/hematoma. No acute posttraumatic  intracranial hemorrhage, definite signs of acute/subacute cerebral ischemia, mass or mass effect, hydrocephalus or other abnormal intra or extra-axial fluid collections. Mucosal thickening throughout the ethmoid and maxillary sinuses bilaterally. Small mucosal retention  cysts or polyps in the right maxillary sinus. Mastoids are well pneumatized bilaterally.  CT MAXILLOFACIAL FINDINGS  The right mandibular condyle is mildly anteriorly subluxed, while the left mandibular condyles is dislocated anteriorly. The mandible appears intact. The acute displaced facial bone fractures. Pterygoid plates are intact. Extensive soft tissue swelling in the left cerebral regions. Left globe and retro-orbital soft tissues are grossly normal in appearance.  CT CERVICAL SPINE FINDINGS  Comminuted fracture of C1 involving the anterior arch, posterior arch and anterior aspect of the left lateral mass. Fractures are displaced, with outward expansion of the C1 ring. Multiple small fracture fragments are noted anteriorly around the odontoid process. Posterior and lateral displacement of the right lateral mass relative to the lateral mass of C2, and mild posterior subluxation of the left lateral mass relative to the lateral mass of C2. C2 itself appears intact. High attenuation material in the deep prevertebral soft tissues is compatible with hematoma. No additional acute displaced fractures of the cervical spine are noted. Severe multilevel degenerative disc disease, most pronounced at C5-C6 and C6-C7. Multilevel facet arthropathy, including fusion on the left from C3-C5. Visualized portions of the upper thorax are unremarkable.  IMPRESSION: 1. Acute comminuted displaced fracture of C1 vertebra, with gross disruption of C1-C2 articulation, and large hematoma in the prevertebral soft tissues, as detailed above. 2. Soft tissue contusion and hematoma in the left frontal scalp and left periorbital regions. No acute displaced skull fracture or displaced facial bone fractures. However, the left mandibular condyle is dislocated anteriorly, and the right mandibular condyle is anteriorly subluxed. 3. No acute intracranial abnormalities. 4. Mild paranasal sinus disease, as above, without acute features. Critical  Value/emergent results were called by telephone at the time of interpretation on 02/10/2014 at 4:42 AM to Dr. Blanchie Dessert, who verbally acknowledged these results.   Electronically Signed   By: Vinnie Langton M.D.   On: 02/10/2014 04:47   Ct Maxillofacial Wo Cm  02/10/2014   CLINICAL DATA:  History of trauma from a fall.  EXAM: CT HEAD WITHOUT CONTRAST  CT MAXILLOFACIAL WITHOUT CONTRAST  CT CERVICAL SPINE WITHOUT CONTRAST  TECHNIQUE: Multidetector CT imaging of the head, cervical spine, and maxillofacial structures were performed using the standard protocol without intravenous contrast. Multiplanar CT image reconstructions of the cervical spine and maxillofacial structures were also generated.  COMPARISON:  No priors.  FINDINGS: CT HEAD FINDINGS  Extensive soft tissue swelling and high attenuation in the left frontal scalp, lateral aspect of the periorbital region, and overlying the left maxilla anteriorly and laterally, compatible with contusion/hematoma. No acute posttraumatic intracranial hemorrhage, definite signs of acute/subacute cerebral ischemia, mass or mass effect, hydrocephalus or other abnormal intra or extra-axial fluid collections. Mucosal thickening throughout the ethmoid and maxillary sinuses bilaterally. Small mucosal retention cysts or polyps in the right maxillary sinus. Mastoids are well pneumatized bilaterally.  CT MAXILLOFACIAL FINDINGS  The right mandibular condyle is mildly anteriorly subluxed, while the left mandibular condyles is dislocated anteriorly. The mandible appears intact. The acute displaced facial bone fractures. Pterygoid plates are intact. Extensive soft tissue swelling in the left cerebral regions. Left globe and retro-orbital soft tissues are grossly normal in appearance.  CT CERVICAL SPINE FINDINGS  Comminuted fracture of C1 involving the anterior arch, posterior arch and anterior aspect of the left  lateral mass. Fractures are displaced, with outward expansion of the  C1 ring. Multiple small fracture fragments are noted anteriorly around the odontoid process. Posterior and lateral displacement of the right lateral mass relative to the lateral mass of C2, and mild posterior subluxation of the left lateral mass relative to the lateral mass of C2. C2 itself appears intact. High attenuation material in the deep prevertebral soft tissues is compatible with hematoma. No additional acute displaced fractures of the cervical spine are noted. Severe multilevel degenerative disc disease, most pronounced at C5-C6 and C6-C7. Multilevel facet arthropathy, including fusion on the left from C3-C5. Visualized portions of the upper thorax are unremarkable.  IMPRESSION: 1. Acute comminuted displaced fracture of C1 vertebra, with gross disruption of C1-C2 articulation, and large hematoma in the prevertebral soft tissues, as detailed above. 2. Soft tissue contusion and hematoma in the left frontal scalp and left periorbital regions. No acute displaced skull fracture or displaced facial bone fractures. However, the left mandibular condyle is dislocated anteriorly, and the right mandibular condyle is anteriorly subluxed. 3. No acute intracranial abnormalities. 4. Mild paranasal sinus disease, as above, without acute features. Critical Value/emergent results were called by telephone at the time of interpretation on 02/10/2014 at 4:42 AM to Dr. Blanchie Dessert, who verbally acknowledged these results.   Electronically Signed   By: Vinnie Langton M.D.   On: 02/10/2014 04:47     Blood pressure 157/59, pulse 73, temperature 98.4 F (36.9 C), temperature source Oral, resp. rate 20, weight 80.3 kg (177 lb 0.5 oz), SpO2 100.00%.  PHYSICAL EXAM: Overall appearance:  Alert, conversant, oriented. Old blood on face.  Closed "H" shaped laceration LEFT forehead.   Head: as above Ears:  Sl waxy.  TM's aerated and intact.  No Battle's sign. Nose:  Clear, moist. Oral Cavity:  Teeth in good repair.  Full  ROM without pain or crepitus Oral Pharynx/Hypopharynx/Larynx:  Sl ecchymotic discoloration retropharyngeal Neuro:  Not examined.  Grossly intact Neck:  Hard cervical collar  Studies Reviewed:  CT maxillofacial    Assessment/Plan Forehead laceration, closed by ER staff.  Will remove sutures in 10 days my office  Sinus mucosal thickening.  Trivial, no therapy needed.  Nasal obstruction from retropharyngeal swelling.  No impending airway issues.  Will resolve with time.  C-spine fx: per Neurosurgery  Reported jaw dislocation likely secondary to hard cervical collar.  No symptoms, no actual injury.   Jodi Marble 1/44/3154, 9:09 AM

## 2014-02-10 NOTE — ED Notes (Signed)
Dr. Rosebud Poles at bedside

## 2014-02-10 NOTE — ED Notes (Signed)
Ice applied to L forehead as ordered.

## 2014-02-10 NOTE — ED Notes (Signed)
MD Cornett at bedside. 

## 2014-02-10 NOTE — ED Notes (Signed)
Patient arrives from home via EMS. Per EMS... Patient was found resting in bed. Had called Son after falling down stairs, but did not remember making call. History of hypoglycemic episodes, initial blood sugar found to be 66, and patient stated that he took 2 glucose tablets. Number of stairs unknown, but trail of blood noted by EMS starting approximately 7 steps from bottom or 13 with pool of blood at base of stairs. Patient takes Xarelto per EMS.

## 2014-02-10 NOTE — Consult Note (Addendum)
[facility] Medical Consultation  Curtis Dunn YHC:[id number] DOB: 1941-01-20 DOA: 02/10/2014 PCP: [name], MD   Requesting physician: Neurosurgery  Date of consultation: 02/10/2014 Reason for consultation: Management of chronic medical conditions   Impression/Recommendations Active Problems:   Fracture dislocation of cervical spine - with multiple other traumas outlined below - management per primary team - ensure adequate analgesia for symptom control    HTN - continue home medical regimen with Norvasc, Metoprolol, HCTZ, Lasix  - hold Ramipril for now - readjust the regimen as indicated    HLD - continue statin    DM type II - continue Insulin per home medical regimen and add SSI   Atrial fibrillation - careful monitoring for signs of bleeding - rate controlled with Metoprolol  - pt is in NSR at this time  - holding Xarel,to for now and may be able to resume in next 24 hours, per primary team  - CBC in AM   Diastolic CHF, grade I - chronic with EF 60% in 2013 - continue Lasix - monitor daily weights, strict I's and O's  I will followup again tomorrow. Please contact me if I can be of assistance in the meanwhile. Thank you for this consultation.  Chief Complaint: Fall  HPI:  Pt is 73 yo male with HTN, HLD, DM type II, diastolic CHF (per last 2 D ECHO in 2013) requiring Lasix, atrial fibrillation (on Metoprolol and Xarelto), colon cancer and status post colectomy 10 years ago, who presented to [facility] ED earlier today after sustaining an episode of fall at home last night after a hypoglycemic event. Pt currently denies chest pain, shortness of breath, no fevers, chills, abdominal or urinary concerns. He reports discomfort in his neck with no specific alleviating factors but worse with movement. In ED, trauma work up consistent with right wrist fracture, dislocation of the mandible, a retropharyngeal hematoma, and a C1 fracture. Trauma team asked to admit  for further management and [facility] asked to consult for management of chronic medical problems.   Review of Systems:  Constitutional: Negative for fever, chills, diaphoresis, activity change, appetite change and fatigue.  HENT: Negative for ear pain, nosebleeds, congestion, facial swelling, rhinorrhea, neck pain, neck stiffness and ear discharge.   Eyes: Negative for pain, discharge, redness, itching and visual disturbance.  Respiratory: Negative for cough, choking, chest tightness, shortness of breath, wheezing and stridor.   Cardiovascular: Negative for chest pain, palpitations and leg swelling.  Gastrointestinal: Negative for abdominal distention.  Genitourinary: Negative for dysuria, urgency, frequency, hematuria, flank pain, decreased urine volume, difficulty urinating and dyspareunia.  Musculoskeletal: Negative for back pain.  Neurological: Negative for dizziness, tremors, seizures, syncope, facial asymmetry, speech difficulty, weakness, light-headedness, numbness and headaches.  Hematological: Negative for adenopathy. Does not bruise/bleed easily.  Psychiatric/Behavioral: Negative for hallucinations, behavioral problems, confusion, dysphoric mood, decreased concentration and agitation.   Past Medical History  Diagnosis Date  . Palpitations 07/16/2005    normal nuclear study  . Tachycardia   . Diabetes mellitus   . Hypertension   . Hyperlipidemia   . Colon cancer    Past Surgical History  Procedure Laterality Date  . Appendectomy  2001  . Colon resection    . Elbow fracture surgery    . Arm surgery     Social History:  reports that he has quit smoking. He does not have any smokeless tobacco history on file. His alcohol and drug histories are not on file.  No Known Allergies Family History  Problem  Relation Age of Onset  . Heart disease Father     Prior to Admission medications   Medication Sig Start Date End Date Taking? Authorizing Provider  amLODipine (NORVASC) 10 MG  tablet Take 10 mg by mouth daily.    Historical Provider, MD  Ascorbic Acid (VITAMIN C) 1000 MG tablet Take 1,000 mg by mouth daily.    Historical Provider, MD  Chelated Magnesium 100 MG TABS Take by mouth daily.    Historical Provider, MD  Cholecalciferol 1000 UNITS capsule Take 1,000 Units by mouth daily.    Historical Provider, MD  Cyanocobalamin (VITAMIN B 12 PO) Take by mouth. Taking 1000 daily     Historical Provider, MD  Docosahexaenoic Acid (DHA OMEGA 3 PO) Take 1,200 mg by mouth daily.     Historical Provider, MD  furosemide (LASIX) 20 MG tablet Take 20 mg by mouth daily.    Historical Provider, MD  hydrochlorothiazide (HYDRODIURIL) 25 MG tablet Take 25 mg by mouth daily.    Historical Provider, MD  insulin glargine (LANTUS) 100 UNIT/ML injection Inject into the skin at bedtime. Taking 22 units daily    Historical Provider, MD  insulin lispro (HUMALOG) 100 UNIT/ML injection Inject into the skin 3 (three) times daily before meals. Taking 15units  Am; 4 UNITS  noon and 10 UNITS units at  6 pm    Historical Provider, MD  lovastatin (MEVACOR) 20 MG tablet Take 20 mg by mouth at bedtime.    Historical Provider, MD  metoprolol succinate (TOPROL XL) 25 MG 24 hr tablet Take 1 tablet (25 mg total) by mouth daily. 10/25/13   Darlin Coco, MD  Potassium 99 MG TABS Take by mouth.    Historical Provider, MD  ramipril (ALTACE) 10 MG tablet Take 10 mg by mouth daily.    Historical Provider, MD  Rivaroxaban (XARELTO) 20 MG TABS Take 1 tablet (20 mg total) by mouth daily. 01/12/13   Darlin Coco, MD  Tamsulosin HCl (FLOMAX) 0.4 MG CAPS Take by mouth daily.    Historical Provider, MD  vitamin E 400 UNIT capsule Take 400 Units by mouth daily.    Historical Provider, MD  Zinc 30 MG CAPS Take 25 mg by mouth daily.     Historical Provider, MD  zolpidem (AMBIEN) 10 MG tablet Take 10 mg by mouth at bedtime as needed.    Historical Provider, MD   Physical Exam: Blood pressure 157/73, pulse 74, temperature  98.4 F (36.9 C), temperature source Oral, resp. rate 22, weight 80.3 kg (177 lb 0.5 oz), SpO2 100.00%. Filed Vitals:   02/10/14 0256  BP: 157/73  Pulse: 74  Temp: 98.4 F (36.9 C)  Resp: 22   Physical Exam  Constitutional: Appears well-developed and well-nourished. No distress.  HENT: Normocephalic. External right and left ear normal. Oropharynx is clear and moist.  Eyes: Conjunctivae and EOM are normal. PERRLA, no scleral icterus.  Neck: Normal ROM. Neck supple. No JVD. No tracheal deviation. No thyromegaly.  CVS: RRR, S1/S2 +, no gallops, no carotid bruit.  Pulmonary: Effort and breath sounds normal, no stridor, rhonchi, wheezes, rales.  Abdominal: Soft. BS +,  no distension, tenderness, rebound or guarding.  Musculoskeletal: Right forearm and hand splint. Aspen cervical collar in place Neurological: A&O x 3, strength testing limited in RUE due to splint otherwise good movement in all 4 extremities, sensation intact to soft touch  Lymphadenopathy: No lymphadenopathy noted, cervical, inguinal. Skin: Skin is warm and dry. Not diaphoretic. No erythema. No pallor.  Psychiatric:  Normal mood and affect. Behavior, judgment, thought content normal.   Labs on Admission:  Basic Metabolic Panel:  Recent Labs Lab 02/10/14 0335  NA 142  K 3.9  CL 103  CO2 24  GLUCOSE 152*  BUN 23  CREATININE 1.09  CALCIUM 9.4   Liver Function Tests:  Recent Labs Lab 02/10/14 0335  AST 42*  ALT 30  ALKPHOS 65  BILITOT 0.6  PROT 6.9  ALBUMIN 4.0  CBC:  Recent Labs Lab 02/10/14 0335  WBC 13.8*  NEUTROABS 12.2*  HGB 13.5  HCT 39.1  MCV 92.7  PLT 194   CBG:  Recent Labs Lab 02/10/14 0251  GLUCAP 86    Radiological Exams on Admission: Dg Chest 2 View  02/10/2014   CLINICAL DATA:  History of trauma from a fall.  EXAM: CHEST  2 VIEW  COMPARISON:  No priors.  FINDINGS: Lung volumes are normal. No consolidative airspace disease. No pleural effusions. No pneumothorax. No pulmonary  nodule or mass noted. Pulmonary vasculature and the cardiomediastinal silhouette are within normal limits.  IMPRESSION: 1.  No radiographic evidence of acute cardiopulmonary disease.   Electronically Signed   By: Vinnie Langton M.D.   On: 02/10/2014 04:13   Dg Wrist Complete Right  02/10/2014   CLINICAL DATA:  History of trauma from a fall complaining of right-sided wrist pain.  EXAM: RIGHT WRIST - COMPLETE 3+ VIEW  COMPARISON:  No priors.  FINDINGS: Minimally displaced fracture through the posterior aspect of the distal radius, which appears to extend through the posterior aspect of the articular surface at the radiocarpal joint. Distal ulna appears intact. Overlying soft tissues appear markedly swollen. Carpals appear intact.  IMPRESSION: 1. Mildly displaced fracture of the posterior aspect of the distal radius with probable intra-articular extension, as above.   Electronically Signed   By: Vinnie Langton M.D.   On: 02/10/2014 04:12   Ct Head Wo Contrast  02/10/2014   CLINICAL DATA:  History of trauma from a fall.  EXAM: CT HEAD WITHOUT CONTRAST  CT MAXILLOFACIAL WITHOUT CONTRAST  CT CERVICAL SPINE WITHOUT CONTRAST  TECHNIQUE: Multidetector CT imaging of the head, cervical spine, and maxillofacial structures were performed using the standard protocol without intravenous contrast. Multiplanar CT image reconstructions of the cervical spine and maxillofacial structures were also generated.  COMPARISON:  No priors.  FINDINGS: CT HEAD FINDINGS  Extensive soft tissue swelling and high attenuation in the left frontal scalp, lateral aspect of the periorbital region, and overlying the left maxilla anteriorly and laterally, compatible with contusion/hematoma. No acute posttraumatic intracranial hemorrhage, definite signs of acute/subacute cerebral ischemia, mass or mass effect, hydrocephalus or other abnormal intra or extra-axial fluid collections. Mucosal thickening throughout the ethmoid and maxillary sinuses  bilaterally. Small mucosal retention cysts or polyps in the right maxillary sinus. Mastoids are well pneumatized bilaterally.  CT MAXILLOFACIAL FINDINGS  The right mandibular condyle is mildly anteriorly subluxed, while the left mandibular condyles is dislocated anteriorly. The mandible appears intact. The acute displaced facial bone fractures. Pterygoid plates are intact. Extensive soft tissue swelling in the left cerebral regions. Left globe and retro-orbital soft tissues are grossly normal in appearance.  CT CERVICAL SPINE FINDINGS  Comminuted fracture of C1 involving the anterior arch, posterior arch and anterior aspect of the left lateral mass. Fractures are displaced, with outward expansion of the C1 ring. Multiple small fracture fragments are noted anteriorly around the odontoid process. Posterior and lateral displacement of the right lateral mass relative to the lateral mass  of C2, and mild posterior subluxation of the left lateral mass relative to the lateral mass of C2. C2 itself appears intact. High attenuation material in the deep prevertebral soft tissues is compatible with hematoma. No additional acute displaced fractures of the cervical spine are noted. Severe multilevel degenerative disc disease, most pronounced at C5-C6 and C6-C7. Multilevel facet arthropathy, including fusion on the left from C3-C5. Visualized portions of the upper thorax are unremarkable.  IMPRESSION: 1. Acute comminuted displaced fracture of C1 vertebra, with gross disruption of C1-C2 articulation, and large hematoma in the prevertebral soft tissues, as detailed above. 2. Soft tissue contusion and hematoma in the left frontal scalp and left periorbital regions. No acute displaced skull fracture or displaced facial bone fractures. However, the left mandibular condyle is dislocated anteriorly, and the right mandibular condyle is anteriorly subluxed. 3. No acute intracranial abnormalities. 4. Mild paranasal sinus disease, as above,  without acute features. Critical Value/emergent results were called by telephone at the time of interpretation on 02/10/2014 at 4:42 AM to Dr. Blanchie Dessert, who verbally acknowledged these results.   Electronically Signed   By: Vinnie Langton M.D.   On: 02/10/2014 04:47   Ct Cervical Spine Wo Contrast  02/10/2014   CLINICAL DATA:  History of trauma from a fall.  EXAM: CT HEAD WITHOUT CONTRAST  CT MAXILLOFACIAL WITHOUT CONTRAST  CT CERVICAL SPINE WITHOUT CONTRAST  TECHNIQUE: Multidetector CT imaging of the head, cervical spine, and maxillofacial structures were performed using the standard protocol without intravenous contrast. Multiplanar CT image reconstructions of the cervical spine and maxillofacial structures were also generated.  COMPARISON:  No priors.  FINDINGS: CT HEAD FINDINGS  Extensive soft tissue swelling and high attenuation in the left frontal scalp, lateral aspect of the periorbital region, and overlying the left maxilla anteriorly and laterally, compatible with contusion/hematoma. No acute posttraumatic intracranial hemorrhage, definite signs of acute/subacute cerebral ischemia, mass or mass effect, hydrocephalus or other abnormal intra or extra-axial fluid collections. Mucosal thickening throughout the ethmoid and maxillary sinuses bilaterally. Small mucosal retention cysts or polyps in the right maxillary sinus. Mastoids are well pneumatized bilaterally.  CT MAXILLOFACIAL FINDINGS  The right mandibular condyle is mildly anteriorly subluxed, while the left mandibular condyles is dislocated anteriorly. The mandible appears intact. The acute displaced facial bone fractures. Pterygoid plates are intact. Extensive soft tissue swelling in the left cerebral regions. Left globe and retro-orbital soft tissues are grossly normal in appearance.  CT CERVICAL SPINE FINDINGS  Comminuted fracture of C1 involving the anterior arch, posterior arch and anterior aspect of the left lateral mass. Fractures are  displaced, with outward expansion of the C1 ring. Multiple small fracture fragments are noted anteriorly around the odontoid process. Posterior and lateral displacement of the right lateral mass relative to the lateral mass of C2, and mild posterior subluxation of the left lateral mass relative to the lateral mass of C2. C2 itself appears intact. High attenuation material in the deep prevertebral soft tissues is compatible with hematoma. No additional acute displaced fractures of the cervical spine are noted. Severe multilevel degenerative disc disease, most pronounced at C5-C6 and C6-C7. Multilevel facet arthropathy, including fusion on the left from C3-C5. Visualized portions of the upper thorax are unremarkable.  IMPRESSION: 1. Acute comminuted displaced fracture of C1 vertebra, with gross disruption of C1-C2 articulation, and large hematoma in the prevertebral soft tissues, as detailed above. 2. Soft tissue contusion and hematoma in the left frontal scalp and left periorbital regions. No acute displaced skull fracture  or displaced facial bone fractures. However, the left mandibular condyle is dislocated anteriorly, and the right mandibular condyle is anteriorly subluxed. 3. No acute intracranial abnormalities. 4. Mild paranasal sinus disease, as above, without acute features. Critical Value/emergent results were called by telephone at the time of interpretation on 02/10/2014 at 4:42 AM to Dr. Blanchie Dessert, who verbally acknowledged these results.   Electronically Signed   By: Vinnie Langton M.D.   On: 02/10/2014 04:47   Ct Maxillofacial Wo Cm  02/10/2014   CLINICAL DATA:  History of trauma from a fall.  EXAM: CT HEAD WITHOUT CONTRAST  CT MAXILLOFACIAL WITHOUT CONTRAST  CT CERVICAL SPINE WITHOUT CONTRAST  TECHNIQUE: Multidetector CT imaging of the head, cervical spine, and maxillofacial structures were performed using the standard protocol without intravenous contrast. Multiplanar CT image reconstructions  of the cervical spine and maxillofacial structures were also generated.  COMPARISON:  No priors.  FINDINGS: CT HEAD FINDINGS  Extensive soft tissue swelling and high attenuation in the left frontal scalp, lateral aspect of the periorbital region, and overlying the left maxilla anteriorly and laterally, compatible with contusion/hematoma. No acute posttraumatic intracranial hemorrhage, definite signs of acute/subacute cerebral ischemia, mass or mass effect, hydrocephalus or other abnormal intra or extra-axial fluid collections. Mucosal thickening throughout the ethmoid and maxillary sinuses bilaterally. Small mucosal retention cysts or polyps in the right maxillary sinus. Mastoids are well pneumatized bilaterally.  CT MAXILLOFACIAL FINDINGS  The right mandibular condyle is mildly anteriorly subluxed, while the left mandibular condyles is dislocated anteriorly. The mandible appears intact. The acute displaced facial bone fractures. Pterygoid plates are intact. Extensive soft tissue swelling in the left cerebral regions. Left globe and retro-orbital soft tissues are grossly normal in appearance.  CT CERVICAL SPINE FINDINGS  Comminuted fracture of C1 involving the anterior arch, posterior arch and anterior aspect of the left lateral mass. Fractures are displaced, with outward expansion of the C1 ring. Multiple small fracture fragments are noted anteriorly around the odontoid process. Posterior and lateral displacement of the right lateral mass relative to the lateral mass of C2, and mild posterior subluxation of the left lateral mass relative to the lateral mass of C2. C2 itself appears intact. High attenuation material in the deep prevertebral soft tissues is compatible with hematoma. No additional acute displaced fractures of the cervical spine are noted. Severe multilevel degenerative disc disease, most pronounced at C5-C6 and C6-C7. Multilevel facet arthropathy, including fusion on the left from C3-C5. Visualized  portions of the upper thorax are unremarkable.  IMPRESSION: 1. Acute comminuted displaced fracture of C1 vertebra, with gross disruption of C1-C2 articulation, and large hematoma in the prevertebral soft tissues, as detailed above. 2. Soft tissue contusion and hematoma in the left frontal scalp and left periorbital regions. No acute displaced skull fracture or displaced facial bone fractures. However, the left mandibular condyle is dislocated anteriorly, and the right mandibular condyle is anteriorly subluxed. 3. No acute intracranial abnormalities. 4. Mild paranasal sinus disease, as above, without acute features. Critical Value/emergent results were called by telephone at the time of interpretation on 02/10/2014 at 4:42 AM to Dr. Blanchie Dessert, who verbally acknowledged these results.   Electronically Signed   By: Vinnie Langton M.D.   On: 02/10/2014 04:47    EKG: Independently reviewed. NSR, LVH  Time spent: 45 minutes   Faye Ramsay [facility] Pager [phone]  If 7PM-7AM, please contact night-coverage [email or web address] Password [facility] 02/10/2014, 7:33 AM

## 2014-02-11 DIAGNOSIS — S129XXA Fracture of neck, unspecified, initial encounter: Secondary | ICD-10-CM

## 2014-02-11 DIAGNOSIS — S5290XA Unspecified fracture of unspecified forearm, initial encounter for closed fracture: Secondary | ICD-10-CM

## 2014-02-11 DIAGNOSIS — D62 Acute posthemorrhagic anemia: Secondary | ICD-10-CM | POA: Diagnosis not present

## 2014-02-11 LAB — CBC
HCT: 37.1 % — ABNORMAL LOW (ref 39.0–52.0)
Hemoglobin: 12.8 g/dL — ABNORMAL LOW (ref 13.0–17.0)
MCH: 32.6 pg (ref 26.0–34.0)
MCHC: 34.5 g/dL (ref 30.0–36.0)
MCV: 94.4 fL (ref 78.0–100.0)
Platelets: 177 10*3/uL (ref 150–400)
RBC: 3.93 MIL/uL — ABNORMAL LOW (ref 4.22–5.81)
RDW: 13.2 % (ref 11.5–15.5)
WBC: 10.9 10*3/uL — ABNORMAL HIGH (ref 4.0–10.5)

## 2014-02-11 LAB — COMPREHENSIVE METABOLIC PANEL
ALT: 24 U/L (ref 0–53)
AST: 35 U/L (ref 0–37)
Albumin: 3.1 g/dL — ABNORMAL LOW (ref 3.5–5.2)
Alkaline Phosphatase: 55 U/L (ref 39–117)
BUN: 25 mg/dL — ABNORMAL HIGH (ref 6–23)
CO2: 28 mEq/L (ref 19–32)
Calcium: 8.8 mg/dL (ref 8.4–10.5)
Chloride: 102 mEq/L (ref 96–112)
Creatinine, Ser: 0.92 mg/dL (ref 0.50–1.35)
GFR calc Af Amer: >90 mL/min (ref >90)
GFR calc non Af Amer: 82 mL/min — ABNORMAL LOW (ref >90)
Glucose, Bld: 188 mg/dL — ABNORMAL HIGH (ref 70–99)
Potassium: 4.2 mEq/L (ref 3.7–5.3)
Sodium: 140 mEq/L (ref 137–147)
Total Bilirubin: 0.7 mg/dL (ref 0.3–1.2)
Total Protein: 6 g/dL (ref 6.0–8.3)

## 2014-02-11 LAB — GLUCOSE, CAPILLARY
Glucose-Capillary: 124 mg/dL — ABNORMAL HIGH (ref 70–99)
Glucose-Capillary: 132 mg/dL — ABNORMAL HIGH (ref 70–99)
Glucose-Capillary: 169 mg/dL — ABNORMAL HIGH (ref 70–99)
Glucose-Capillary: 203 mg/dL — ABNORMAL HIGH (ref 70–99)
Glucose-Capillary: 207 mg/dL — ABNORMAL HIGH (ref 70–99)
Glucose-Capillary: 308 mg/dL — ABNORMAL HIGH (ref 70–99)

## 2014-02-11 MED ORDER — INSULIN ASPART 100 UNIT/ML ~~LOC~~ SOLN
0.0000 [IU] | Freq: Three times a day (TID) | SUBCUTANEOUS | Status: DC
  Administered 2014-02-11: 5 [IU] via SUBCUTANEOUS
  Administered 2014-02-12 (×3): 8 [IU] via SUBCUTANEOUS
  Administered 2014-02-13 (×2): 11 [IU] via SUBCUTANEOUS

## 2014-02-11 MED ORDER — DOCUSATE SODIUM 100 MG PO CAPS
100.0000 mg | ORAL_CAPSULE | Freq: Two times a day (BID) | ORAL | Status: DC
  Administered 2014-02-11 – 2014-02-14 (×6): 100 mg via ORAL
  Filled 2014-02-11 (×8): qty 1

## 2014-02-11 MED ORDER — TRAMADOL HCL 50 MG PO TABS
50.0000 mg | ORAL_TABLET | Freq: Four times a day (QID) | ORAL | Status: DC | PRN
  Administered 2014-02-11: 100 mg via ORAL
  Administered 2014-02-12: 50 mg via ORAL
  Administered 2014-02-12 – 2014-02-13 (×2): 100 mg via ORAL
  Filled 2014-02-11 (×2): qty 2
  Filled 2014-02-11: qty 1
  Filled 2014-02-11: qty 2

## 2014-02-11 MED ORDER — POLYETHYLENE GLYCOL 3350 17 G PO PACK
17.0000 g | PACK | Freq: Every day | ORAL | Status: DC
  Administered 2014-02-12 – 2014-02-14 (×3): 17 g via ORAL
  Filled 2014-02-11 (×5): qty 1

## 2014-02-11 MED ORDER — ENOXAPARIN SODIUM 40 MG/0.4ML ~~LOC~~ SOLN
40.0000 mg | SUBCUTANEOUS | Status: DC
  Filled 2014-02-11 (×3): qty 0.4

## 2014-02-11 MED ORDER — HYDROMORPHONE HCL PF 1 MG/ML IJ SOLN: 0.5000 mg | INTRAMUSCULAR | Status: DC | PRN

## 2014-02-11 NOTE — Progress Notes (Signed)
Patient ID: Curtis Dunn, male   DOB: [birth year], 73 y.o.   MRN: [id number]   LOS: 1 day   Subjective: No unexpected c/o. Pain controlled.   Objective: Vital signs in last 24 hours: Temp:  [97.9 F (36.6 C)-99 F (37.2 C)] 97.9 F (36.6 C) (03/28 0738) Pulse Rate:  [52-72] 52 (03/28 0800) Resp:  [12-24] 12 (03/28 0800) BP: (125-145)/(43-55) 129/50 mmHg (03/28 0734) SpO2:  [96 %-100 %] 97 % (03/28 0800) Weight:  [176 lb 12.9 oz (80.2 kg)] 176 lb 12.9 oz (80.2 kg) (03/28 0738)    Laboratory  CBC  Recent Labs  02/10/14 0335 02/11/14 0302  WBC 13.8* 10.9*  HGB 13.5 12.8*  HCT 39.1 37.1*  PLT 194 177   BMET  Recent Labs  02/10/14 0335 02/11/14 0302  NA 142 140  K 3.9 4.2  CL 103 102  CO2 24 28  GLUCOSE 152* 188*  BUN 23 25*  CREATININE 1.09 0.92  CALCIUM 9.4 8.8   CBG (last 3)   Recent Labs  02/11/14 0024 02/11/14 0429 02/11/14 0736  GLUCAP 207* 169* 124*    Physical Exam General appearance: alert and no distress Resp: clear to auscultation bilaterally Cardio: regular rate and rhythm GI: normal findings: bowel sounds normal and soft, non-tender Ext: NVI   Assessment/Plan: Fall Concussion C1 fx -- Collar Right wrist fx -- Splint. Spoke with Dr. Caralyn Guile, plan to f/u as OP. ABL anemia -- Mild Multiple medical problems -- per IM FEN -- Orals for pain, avoid narcotics if possible. Give diet. SL IV. VTE -- SCD's, start Lovenox Dispo -- Awaiting PT/OT consults. Transfer to floor.    Lisette Abu, PA-C Pager: [phone] General Trauma PA Pager: [phone]  02/11/2014

## 2014-02-11 NOTE — Progress Notes (Signed)
No issues overnight. Pt does not have any significant c/o.  EXAM:  BP 129/50  Pulse 52  Temp(Src) 97.9 F (36.6 C) (Oral)  Resp 12  Ht 6' (1.829 m)  Wt 80.2 kg (176 lb 12.9 oz)  BMI 23.97 kg/m2  SpO2 97%  Awake, alert, oriented  Speech fluent, appropriate  CN grossly intact  5/5 BUE/BLE   IMPRESSION:  73 y.o. male s/p fall with Jefferson fracture, neurologically intact  PLAN: - Treat C1 fracture conservatively with Hard collar immobilization for 8-12 weeks  Treatment options for this fracture were discussed with the patient and family including risks and benefits of each - surgery vs halo vs hard collar. Pt and family are willing to proceed with treatment with Aspen collar.

## 2014-02-11 NOTE — Progress Notes (Signed)
Patient ID: Curtis Dunn, male   DOB: 27-Mar-1941, 73 y.o.   MRN: [id number]  [facility] PROGRESS NOTE  JOSEANGEL NETTLETON QMG:[id number] DOB: 20-Mar-1941 DOA: 02/10/2014 PCP: [name], MD  Brief narrative: Pt is 73 yo male with HTN, HLD, DM type II, diastolic CHF (per last 2 D ECHO in 2013) requiring Lasix, atrial fibrillation (on Metoprolol and Xarelto), colon cancer and status post colectomy 10 years ago, who presented to [facility] ED earlier today after sustaining an episode of fall at home last night after a hypoglycemic event. Pt currently denies chest pain, shortness of breath, no fevers, chills, abdominal or urinary concerns. He reports discomfort in his neck with no specific alleviating factors but worse with movement. In ED, trauma work up consistent with right wrist fracture, dislocation of the mandible, a retropharyngeal hematoma, and a C1 fracture. Trauma team asked to admit for further management and [facility] asked to consult for management of chronic medical problems.   Active Problems:  Fracture dislocation of cervical spine  - with multiple other traumas outlined below  - pt reports feeling better this AM  - management per primary team  - ensure adequate analgesia for symptom control  HTN  - continue home medical regimen with Norvasc, Metoprolol, HCTZ, Lasix  - continue to hold Ramipril for now  - reasonable inpatient control  HLD  - continue statin  DM type II  - continue Insulin per home medical regimen and SSI  Atrial fibrillation  - careful monitoring for signs of bleeding  - rate controlled with Metoprolol  - pt is in NSR at this time  - holding Xarel,to for now and may be able to resume per primary team  Diastolic CHF, grade I  - chronic with EF 60% in 2013  - continue Lasix  - monitor daily weights, strict I's and O's  - weight this AM 176 lbs   Consultants:  [facility] is consulting team   Procedures/Studies: Dg Chest 2 View   02/10/2014 No  radiographic evidence of acute cardiopulmonary disease.   Dg Wrist Complete Right   02/10/2014  Mildly displaced fracture of the posterior aspect of the distal radius with probable intra-articular extension, as above.   Ct Head Wo Contrast   02/10/2014    1. Acute comminuted displaced fracture of C1 vertebra, with gross disruption of C1-C2 articulation, and large hematoma in the prevertebral soft tissues, as detailed above.  2. Soft tissue contusion and hematoma in the left frontal scalp and left periorbital regions. No acute displaced skull fracture or displaced facial bone fractures. However, the left mandibular condyle is dislocated anteriorly, and the right mandibular condyle is anteriorly subluxed.  3. No acute intracranial abnormalities.  4. Mild paranasal sinus disease, as above, without acute features.  Antibiotics:  None  Code Status: Full Family Communication: Pt at bedside Disposition Plan: Per primary team   HPI/Subjective: No events overnight.   Objective: Filed Vitals:   02/11/14 0738 02/11/14 0800 02/11/14 1127 02/11/14 1330  BP:    144/49  Pulse:  52  59  Temp: 97.9 F (36.6 C)  98.4 F (36.9 C) 98.7 F (37.1 C)  TempSrc: Oral  Oral Oral  Resp:  12  16  Height:      Weight: 80.2 kg (176 lb 12.9 oz)     SpO2:  97%  95%    Intake/Output Summary (Last 24 hours) at 02/11/14 1847 Last data filed at 02/11/14 1812  Gross per 24 hour  Intake 1145.83  ml  Output   1875 ml  Net -729.17 ml    Exam:   General:  Pt is alert, follows commands appropriately, not in acute distress, neck collar in place   Cardiovascular: Regular rate and rhythm, S1/S2, no murmurs, no rubs, no gallops  Respiratory: Clear to auscultation bilaterally, no wheezing, no crackles, no rhonchi  Data Reviewed: Basic Metabolic Panel:  Recent Labs Lab 02/10/14 0335 02/11/14 0302  NA 142 140  K 3.9 4.2  CL 103 102  CO2 24 28  GLUCOSE 152* 188*  BUN 23 25*  CREATININE 1.09 0.92   CALCIUM 9.4 8.8   Liver Function Tests:  Recent Labs Lab 02/10/14 0335 02/11/14 0302  AST 42* 35  ALT 30 24  ALKPHOS 65 55  BILITOT 0.6 0.7  PROT 6.9 6.0  ALBUMIN 4.0 3.1*   CBC:  Recent Labs Lab 02/10/14 0335 02/11/14 0302  WBC 13.8* 10.9*  NEUTROABS 12.2*  --   HGB 13.5 12.8*  HCT 39.1 37.1*  MCV 92.7 94.4  PLT 194 177   CBG:  Recent Labs Lab 02/11/14 0024 02/11/14 0429 02/11/14 0736 02/11/14 1132 02/11/14 1641  GLUCAP 207* 169* 124* 132* 203*    Recent Results (from the past 240 hour(s))  MRSA PCR SCREENING     Status: None   Collection Time    02/10/14  6:43 PM      Result Value Ref Range Status   MRSA by PCR NEGATIVE  NEGATIVE Final   Comment:            The GeneXpert MRSA Assay (FDA     approved for NASAL specimens     only), is one component of a     comprehensive MRSA colonization     surveillance program. It is not     intended to diagnose MRSA     infection nor to guide or     monitor treatment for     MRSA infections.     Scheduled Meds: . amLODipine  10 mg Oral Daily  . docusate sodium  100 mg Oral BID  . enoxaparin (LOVENOX) injection  40 mg Subcutaneous Q24H  . furosemide  20 mg Oral Daily  . hydrochlorothiazide  25 mg Oral Daily  . insulin aspart  0-15 Units Subcutaneous TID WC  . insulin glargine  11 Units Subcutaneous BID  . metoprolol succinate  25 mg Oral Daily  . polyethylene glycol  17 g Oral Daily  . simvastatin  10 mg Oral q1800  . tamsulosin  0.4 mg Oral Daily   Continuous Infusions:  Faye Ramsay, MD  [facility] Pager [phone]  If 7PM-7AM, please contact night-coverage [email or web address] Password [facility] 02/11/2014, 6:47 PM   LOS: 1 day

## 2014-02-11 NOTE — Evaluation (Signed)
Occupational Therapy Evaluation Patient Details Name: Curtis Dunn MRN: [id number] DOB: 06-05-1941 Today's Date: 02/11/2014    History of Present Illness Pt s/p fall down stairs due to hypoglycemia presenting with C1 fracture being managed by aspen collar and R wrist fx managed by splint.   Clinical Impression   Pt presents with below problem list. Pt independent with ADLs, PTA. Feel pt will benefit from acute OT to increase independence prior to d/c.     Follow Up Recommendations  No OT follow up;Supervision/Assistance - 24 hour    Equipment Recommendations  3 in 1 bedside comode    Recommendations for Other Services       Precautions / Restrictions Precautions Precautions: Fall Required Braces or Orthoses: Cervical Brace Cervical Brace: Hard collar;At all times Restrictions Weight Bearing Restrictions: Yes RUE Weight Bearing: Weight bear through elbow only      Mobility Bed Mobility Overal bed mobility: Needs Assistance Bed Mobility: Rolling;Sidelying to Sit;Sit to Sidelying Rolling: Mod assist;Min guard Sidelying to sit: Max assist     Sit to sidelying: Mod assist General bed mobility comments: Assist to lift trunk to get to sitting position. Cues for technique.  Assist to lift legs onto bed when going to sidelying position.    Transfers Overall transfer level: Needs assistance Equipment used: 1 person hand held assist Transfers: Sit to/from Stand Sit to Stand: Min guard;Min assist         General transfer comment: Cues for technique    Balance                                    ADL   Grooming: Oral care;Brushing hair;Sitting;Standing;Minimal assistance   Upper Body Dressing : Minimal assistance;Sitting   Lower Body Dressing: Moderate assistance;Sit to/from stand Toilet Transfer: Minimal assistance;Ambulation;Comfort height toilet;BSC     Functional mobility during ADLs: Minimal assistance General ADL Comments: Pt performed  grooming at sink and practiced toilet transfer. Pt became nauseous during session and vomited. Educated on use of straw to prevent moving neck.  Pt able to cross legs over knees, but pt appeared to be breaking precautions when donning sock, so OT assisted.      Vision                     Perception     Praxis      Pertinent Vitals/Pain Pain 6-8 all over, but especially in head. Nurse in room when pt gave pain rating.      Hand Dominance Right   Extremity/Trunk Assessment Upper Extremity Assessment Upper Extremity Assessment: RUE deficits/detail RUE: Unable to fully assess due to immobilization   Lower Extremity Assessment Lower Extremity Assessment: Defer to PT evaluation       Communication Communication Communication: No difficulties   Cognition Arousal/Alertness: Awake/alert Behavior During Therapy: WFL for tasks assessed/performed Overall Cognitive Status: Within Functional Limits for tasks assessed                     General Comments       Exercises      Home Living Family/patient expects to be discharged to:: Private residence Living Arrangements: Alone Available Help at Discharge: Family;Friend(s);Available 24 hours/day Type of Home: House Home Access: Stairs to enter [employer] of Steps: 2-5 Entrance Stairs-Rails: Can reach both Home Layout: Two level Alternate Level Stairs-Number of Steps: flight Alternate Level Stairs-Rails: Can reach both Bathroom  Shower/Tub: Occupational psychologist: Handicapped height     Home Equipment: Shower seat - built in;Grab bars - toilet   Additional Comments: per PT eval, daughter and son-in-law coming to stay with patient upon d/c      Prior Functioning/Environment Level of Independence: Independent             OT Diagnosis:     OT Problem List: Decreased strength;Decreased activity tolerance;Impaired balance (sitting and/or standing);Decreased knowledge of  precautions;Decreased knowledge of use of DME or AE;Pain;Impaired UE functional use;Decreased range of motion   OT Treatment/Interventions: Self-care/ADL training;DME and/or AE instruction;Therapeutic activities;Patient/family education;Balance training    OT Goals(Current goals can be found in the care plan section) Acute Rehab OT Goals Patient Stated Goal: not stated OT Goal Formulation: With patient Time For Goal Achievement: 02/18/14 Potential to Achieve Goals: Good ADL Goals Pt Will Perform Grooming: with supervision;standing;with caregiver independent in assisting Pt Will Perform Lower Body Dressing: with supervision;with caregiver independent in assisting;sit to/from stand Pt Will Transfer to Toilet: with supervision;ambulating (3 in 1 over commode) Pt Will Perform Toileting - Clothing Manipulation and hygiene: with supervision;sit to/from stand Additional ADL Goal #1: Pt will perform bed mobility at supervision level as precursor for ADLs.    OT Frequency: Min 2X/week   Barriers to D/C:            End of Session: Equipment Utilized During Treatment: Gait belt;Cervical collar  Activity Tolerance: Other (comment) (nauseous during session/dizzy) Patient left: in bed;with nursing/sitter in room;with call bell/phone within reach   Time: 0370-4888 OT Time Calculation (min): 31 min Charges:  OT General Charges $OT Visit: 1 Procedure OT Evaluation $Initial OT Evaluation Tier I: 1 Procedure OT Treatments $Self Care/Home Management : 8-22 mins G-CodesBenito Mccreedy OTR/L [phone] 02/11/2014, 5:03 PM

## 2014-02-11 NOTE — Evaluation (Signed)
Physical Therapy Evaluation Patient Details Name: Curtis Dunn MRN: [id number] DOB: [birth year] Today's Date: 02/11/2014   History of Present Illness  Pt s/p fall down stairs due to hypoglycemia presenting with C1 fracture being managed by aspen collar and R wrist fx managed by splint.  Clinical Impression  Pt tolerating mobility well and anticipate pt to be safe for d/c home with 24/7 supervision/assist from family. Pt reports daughter to come and stay with him. Acute PT to progress mobility while in hospital.     Follow Up Recommendations No PT follow up;Supervision/Assistance - 24 hour    Equipment Recommendations   (may need 3n1 commode- will assess next session)    Recommendations for Other Services       Precautions / Restrictions Precautions Precautions: Fall Required Braces or Orthoses: Cervical Brace Cervical Brace: Hard collar Restrictions Weight Bearing Restrictions: Yes RUE Weight Bearing: Weight bear through elbow only      Mobility  Bed Mobility Overal bed mobility: Needs Assistance Bed Mobility: Rolling;Sidelying to Sit Rolling: Min assist Sidelying to sit: Mod assist       General bed mobility comments: max directional v/c's for log rolling technique, minA for trunk elevation, v/c's to push thru R elbow and L hand  Transfers Overall transfer level: Needs assistance Equipment used: 1 person hand held assist Transfers: Sit to/from Stand Sit to Stand: Min assist         General transfer comment: slow, guarded. pt nauseous and dizzy upon sitting and then standing but resolved some  Ambulation/Gait Ambulation/Gait assistance: Min assist Ambulation Distance (Feet): 200 Feet Assistive device: 1 person hand held assist Gait Pattern/deviations: Step-to pattern;Decreased stride length Gait velocity: slow and guarded. pt con't report "I'm just so sleepy"      Stairs            Wheelchair Mobility    Modified Rankin (Stroke Patients  Only)       Balance Overall balance assessment: Needs assistance Sitting-balance support: Feet supported Sitting balance-Leahy Scale: Fair     Standing balance support: Single extremity supported Standing balance-Leahy Scale: Fair Standing balance comment: pt mildly unsteady due to dizziness                     Pertinent Vitals/Pain Denies neck pain at this time, reports R wrist discomfort.    Home Living Family/patient expects to be discharged to:: Private residence Living Arrangements: Alone Available Help at Discharge: Family;Friend(s);Available 24 hours/day Type of Home: House Home Access: Stairs to enter Entrance Stairs-Rails: Can reach both Entrance Stairs-Number of Steps: 2-5 Home Layout: Two level Home Equipment: None Additional Comments: daughter and son-in-law coming to stay with patient upon d/c    Prior Function Level of Independence: Independent               Hand Dominance   Dominant Hand: Right    Extremity/Trunk Assessment   Upper Extremity Assessment: RUE deficits/detail RUE Deficits / Details: wrist in splint, full ROM at shoulder and elbow         Lower Extremity Assessment: Overall WFL for tasks assessed      Cervical / Trunk Assessment:  (cervical fx)  Communication   Communication: No difficulties  Cognition Arousal/Alertness: Awake/alert Behavior During Therapy: WFL for tasks assessed/performed Overall Cognitive Status: Within Functional Limits for tasks assessed                      General Comments  Exercises        Assessment/Plan    PT Assessment Patient needs continued PT services  PT Diagnosis Difficulty walking;Acute pain   PT Problem List Decreased strength;Decreased activity tolerance;Decreased balance;Decreased mobility  PT Treatment Interventions DME instruction;Gait training;Stair training;Functional mobility training;Therapeutic activities;Therapeutic exercise;Balance training   PT  Goals (Current goals can be found in the Care Plan section) Acute Rehab PT Goals Patient Stated Goal: home PT Goal Formulation: With patient Time For Goal Achievement: 02/18/14 Potential to Achieve Goals: Good    Frequency Min 4X/week   Barriers to discharge        End of Session Equipment Utilized During Treatment: Gait belt Activity Tolerance: Patient tolerated treatment well Patient left: in chair;with call bell/phone within reach         Time: 1215-1247 PT Time Calculation (min): 32 min   Charges:   PT Evaluation $Initial PT Evaluation Tier I: 1 Procedure PT Treatments $Gait Training: 8-22 mins   PT G CodesKingsley Callander 02/11/2014, 1:39 PM   Kittie Plater, PT, DPT Pager #: [phone] Office #: [phone]

## 2014-02-11 NOTE — Progress Notes (Signed)
Patient had incontinent episode. Patient cleaned by RN and tech. Patient refusing condom cath. Patient requests to use urinal.

## 2014-02-11 NOTE — Progress Notes (Signed)
Appreciate the note from neurosurgery.  Will transfer to the floor and get PT/OT involved.  This patient has been seen and I agree with the findings and treatment plan.  Kathryne Eriksson. Dahlia Bailiff, MD, [place] [phone] (pager) [phone] (direct pager) Trauma Surgeon

## 2014-02-11 NOTE — Progress Notes (Signed)
Pt adm to rm 5n14. Arrived by CNA in wheelchair

## 2014-02-12 ENCOUNTER — Inpatient Hospital Stay (HOSPITAL_COMMUNITY): Payer: Medicare Other

## 2014-02-12 LAB — CBC
HCT: 39 % (ref 39.0–52.0)
Hemoglobin: 13.5 g/dL (ref 13.0–17.0)
MCH: 32.2 pg (ref 26.0–34.0)
MCHC: 34.6 g/dL (ref 30.0–36.0)
MCV: 93.1 fL (ref 78.0–100.0)
Platelets: 166 10*3/uL (ref 150–400)
RBC: 4.19 MIL/uL — ABNORMAL LOW (ref 4.22–5.81)
RDW: 12.7 % (ref 11.5–15.5)
WBC: 12 10*3/uL — ABNORMAL HIGH (ref 4.0–10.5)

## 2014-02-12 LAB — GLUCOSE, CAPILLARY
Glucose-Capillary: 278 mg/dL — ABNORMAL HIGH (ref 70–99)
Glucose-Capillary: 254 mg/dL — ABNORMAL HIGH (ref 70–99)
Glucose-Capillary: 296 mg/dL — ABNORMAL HIGH (ref 70–99)
Glucose-Capillary: 238 mg/dL — ABNORMAL HIGH (ref 70–99)

## 2014-02-12 LAB — BASIC METABOLIC PANEL
BUN: 25 mg/dL — ABNORMAL HIGH (ref 6–23)
CO2: 27 mEq/L (ref 19–32)
Calcium: 9.1 mg/dL (ref 8.4–10.5)
Chloride: 91 mEq/L — ABNORMAL LOW (ref 96–112)
Creatinine, Ser: 0.85 mg/dL (ref 0.50–1.35)
GFR calc Af Amer: >90 mL/min (ref >90)
GFR calc non Af Amer: 85 mL/min — ABNORMAL LOW (ref >90)
Glucose, Bld: 295 mg/dL — ABNORMAL HIGH (ref 70–99)
Potassium: 4.2 mEq/L (ref 3.7–5.3)
Sodium: 132 mEq/L — ABNORMAL LOW (ref 137–147)

## 2014-02-12 MED ORDER — HYDROCODONE-ACETAMINOPHEN 5-325 MG PO TABS
1.0000 | ORAL_TABLET | ORAL | Status: DC | PRN
  Administered 2014-02-12 – 2014-02-14 (×5): 1 via ORAL
  Filled 2014-02-12 (×5): qty 1

## 2014-02-12 MED ORDER — HYDROMORPHONE HCL PF 1 MG/ML IJ SOLN: 0.5000 mg | INTRAMUSCULAR | Status: DC | PRN

## 2014-02-12 MED ORDER — RIVAROXABAN 20 MG PO TABS
20.0000 mg | ORAL_TABLET | Freq: Every day | ORAL | Status: DC
  Filled 2014-02-12: qty 1

## 2014-02-12 MED ORDER — RAMIPRIL 10 MG PO CAPS
10.0000 mg | ORAL_CAPSULE | Freq: Every day | ORAL | Status: DC
  Administered 2014-02-12 – 2014-02-14 (×3): 10 mg via ORAL
  Filled 2014-02-12 (×3): qty 1

## 2014-02-12 MED ORDER — RIVAROXABAN 20 MG PO TABS
20.0000 mg | ORAL_TABLET | Freq: Every day | ORAL | Status: DC
  Administered 2014-02-13 – 2014-02-14 (×2): 20 mg via ORAL
  Filled 2014-02-12 (×3): qty 1

## 2014-02-12 MED ORDER — BACITRACIN ZINC 500 UNIT/GM EX OINT
TOPICAL_OINTMENT | Freq: Two times a day (BID) | CUTANEOUS | Status: DC
  Administered 2014-02-12 – 2014-02-14 (×5): via TOPICAL
  Filled 2014-02-12: qty 28.35

## 2014-02-12 NOTE — Progress Notes (Signed)
Pt seen and agree with PA note  Curtis Dunn. Redmond Pulling, MD, FACS General, Bariatric, & Minimally Invasive Surgery [facility], [place]

## 2014-02-12 NOTE — Progress Notes (Signed)
Patient ID: Curtis Dunn, male   DOB: 05/13/1941, 73 y.o.   MRN: [id number]   LOS: 2 days   Subjective: Doing ok, didn't sleep especially well. Pain controlled.   Objective: Vital signs in last 24 hours: Temp:  [98.4 F (36.9 C)-99.5 F (37.5 C)] 99.5 F (37.5 C) (03/29 0500) Pulse Rate:  [59-94] 94 (03/29 0500) Resp:  [12-20] 20 (03/29 0500) BP: (127-144)/(49-66) 127/66 mmHg (03/29 0500) SpO2:  [92 %-96 %] 96 % (03/29 0500) Weight:  [170 lb (77.111 kg)] 170 lb (77.111 kg) (03/29 0500) Last BM Date: 02/10/14   Laboratory  CBC  Recent Labs  02/11/14 0302 02/12/14 0448  WBC 10.9* 12.0*  HGB 12.8* 13.5  HCT 37.1* 39.0  PLT 177 166   BMET  Recent Labs  02/11/14 0302 02/12/14 0448  NA 140 132*  K 4.2 4.2  CL 102 91*  CO2 28 27  GLUCOSE 188* 295*  BUN 25* 25*  CREATININE 0.92 0.85  CALCIUM 8.8 9.1   CBG (last 3)   Recent Labs  02/11/14 1641 02/11/14 2239 02/12/14 0729  GLUCAP 203* 308* 278*    Physical Exam General appearance: alert and no distress Resp: clear to auscultation bilaterally Cardio: regular rate and rhythm GI: normal findings: bowel sounds normal and soft, non-tender   Assessment/Plan: Fall  Concussion  Forehead lac -- Local care, Dr. Erik Obey to remove sutures C1 fx -- Collar  Right wrist fx -- Splint. Spoke with Dr. Caralyn Guile, plan to f/u as OP.  ABL anemia -- Mild  Multiple medical problems -- per IM  FEN -- No issues VTE -- SCD's, Lovenox  Dispo -- PT/OT recommending home w/24h supervision which he will have starting tomorrow. Will plan d/c tomorrow.    Lisette Abu, PA-C Pager: [phone] General Trauma PA Pager: [phone]  02/12/2014

## 2014-02-12 NOTE — Progress Notes (Signed)
Patient ID: Curtis Dunn, male   DOB: 1940/11/20, 73 y.o.   MRN: [id number]  [facility] PROGRESS NOTE  LIDO MASKE KYH:[id number] DOB: 1941-01-04 DOA: 02/10/2014 PCP: [name], MD  Brief narrative:  Pt is 73 yo male with HTN, HLD, DM type II, diastolic CHF (per last 2 D ECHO in 2013) requiring Lasix, atrial fibrillation (on Metoprolol and Xarelto), colon cancer and status post colectomy 10 years ago, who presented to [facility] ED earlier today after sustaining an episode of fall at home last night after a hypoglycemic event. Pt currently denies chest pain, shortness of breath, no fevers, chills, abdominal or urinary concerns. He reports discomfort in his neck with no specific alleviating factors but worse with movement. In ED, trauma work up consistent with right wrist fracture, dislocation of the mandible, a retropharyngeal hematoma, and a C1 fracture. Trauma team asked to admit for further management and [facility] asked to consult for management of chronic medical problems.   Active Problems:  Fracture dislocation of cervical spine  - with multiple other traumas outlined below  - pt reports continuing to feel better  - management per primary team  - ensure adequate analgesia for symptom control  Leukocytosis - WBC trend since admission 13.8 --> 10.9 --> 12.0 - with low grade fever overnight Tmax 99.5 F - unclear source at this time  - will ask for UA and CXR to ensure no underlying infectious etiology - will also restart Xarelto HTN  - continue home medical regimen with Norvasc, Metoprolol, HCTZ, Lasix  - will add ACEI as per home medical regimen (Ramipril)  HLD  - continue statin  DM type II  - continue Insulin per home medical regimen and SSI  Atrial fibrillation  - rate controlled with Metoprolol  - pt is in NSR at this time  - will resume Xarelto at this time  Diastolic CHF, grade I  - chronic with EF 60% in 2013  - continue Lasix  - monitor daily weights,  strict I's and O's  - weight trend 176 lbs --> 170 lbs this AM  Consultants:  [facility] is consulting team  Procedures/Studies:  Dg Chest 2 View 02/10/2014 No radiographic evidence of acute cardiopulmonary disease.  Dg Wrist Complete Right 02/10/2014 Mildly displaced fracture of the posterior aspect of the distal radius with probable intra-articular extension, as above.  Ct Head Wo Contrast 02/10/2014  1. Acute comminuted displaced fracture of C1 vertebra, with gross disruption of C1-C2 articulation, and large hematoma in the prevertebral soft tissues, as detailed above.  2. Soft tissue contusion and hematoma in the left frontal scalp and left periorbital regions. No acute displaced skull fracture or displaced facial bone fractures. However, the left mandibular condyle is dislocated anteriorly, and the right mandibular condyle is anteriorly subluxed.  3. No acute intracranial abnormalities.  4. Mild paranasal sinus disease, as above, without acute features.  Antibiotics:  None  Code Status: Full  Family Communication: Pt at bedside  Disposition Plan: Per primary team   HPI/Subjective: No events overnight.   Objective: Filed Vitals:   02/11/14 1127 02/11/14 1330 02/11/14 2013 02/12/14 0500  BP:  144/49 139/60 127/66  Pulse:  59 63 94  Temp: 98.4 F (36.9 C) 98.7 F (37.1 C) 98.8 F (37.1 C) 99.5 F (37.5 C)  TempSrc: Oral Oral Oral Oral  Resp:  16 12 20   Height:      Weight:    77.111 kg (170 lb)  SpO2:  95% 92% 96%  Intake/Output Summary (Last 24 hours) at 02/12/14 1158 Last data filed at 02/11/14 1812  Gross per 24 hour  Intake    120 ml  Output    250 ml  Net   -130 ml    Exam:   General:  Pt is alert, follows commands appropriately, not in acute distress, multiple bruises over the face, neck collar in place   Cardiovascular: Regular rate and rhythm, S1/S2, no murmurs, no rubs, no gallops  Respiratory: Clear to auscultation bilaterally, no wheezing, no crackles, no  rhonchi  Abdomen: Soft, non tender, non distended, bowel sounds present, no guarding  Extremities: No edema, pulses DP and PT palpable bilaterally, right arm in splint  Data Reviewed: Basic Metabolic Panel:  Recent Labs Lab 02/10/14 0335 02/11/14 0302 02/12/14 0448  NA 142 140 132*  K 3.9 4.2 4.2  CL 103 102 91*  CO2 24 28 27   GLUCOSE 152* 188* 295*  BUN 23 25* 25*  CREATININE 1.09 0.92 0.85  CALCIUM 9.4 8.8 9.1   Liver Function Tests:  Recent Labs Lab 02/10/14 0335 02/11/14 0302  AST 42* 35  ALT 30 24  ALKPHOS 65 55  BILITOT 0.6 0.7  PROT 6.9 6.0  ALBUMIN 4.0 3.1*   CBC:  Recent Labs Lab 02/10/14 0335 02/11/14 0302 02/12/14 0448  WBC 13.8* 10.9* 12.0*  NEUTROABS 12.2*  --   --   HGB 13.5 12.8* 13.5  HCT 39.1 37.1* 39.0  MCV 92.7 94.4 93.1  PLT 194 177 166   CBG:  Recent Labs Lab 02/11/14 0736 02/11/14 1132 02/11/14 1641 02/11/14 2239 02/12/14 0729  GLUCAP 124* 132* 203* 308* 278*    Recent Results (from the past 240 hour(s))  MRSA PCR SCREENING     Status: None   Collection Time    02/10/14  6:43 PM      Result Value Ref Range Status   MRSA by PCR NEGATIVE  NEGATIVE Final   Comment:            The GeneXpert MRSA Assay (FDA     approved for NASAL specimens     only), is one component of a     comprehensive MRSA colonization     surveillance program. It is not     intended to diagnose MRSA     infection nor to guide or     monitor treatment for     MRSA infections.     Scheduled Meds: . amLODipine  10 mg Oral Daily  . bacitracin   Topical BID  . docusate sodium  100 mg Oral BID  . enoxaparin (LOVENOX) injection  40 mg Subcutaneous Q24H  . furosemide  20 mg Oral Daily  . hydrochlorothiazide  25 mg Oral Daily  . insulin aspart  0-15 Units Subcutaneous TID WC  . insulin glargine  11 Units Subcutaneous BID  . metoprolol succinate  25 mg Oral Daily  . polyethylene glycol  17 g Oral Daily  . simvastatin  10 mg Oral q1800  .  tamsulosin  0.4 mg Oral Daily   Continuous Infusions:  Faye Ramsay, MD  [facility] Pager [phone]  If 7PM-7AM, please contact night-coverage [email or web address] Password [email or web address] 02/12/2014, 11:58 AM   LOS: 2 days

## 2014-02-12 NOTE — Progress Notes (Signed)
Physical Therapy Treatment Patient Details Name: Curtis Dunn MRN: [id number] DOB: 1941-11-16 Today's Date: 02/12/2014    History of Present Illness Pt s/p fall down stairs due to hypoglycemia presenting with C1 fracture being managed by aspen collar and R wrist fx managed by splint.    PT Comments    Pt progressing well with mobility.  Pt anticipates DC to home tomorrow with family support.    Follow Up Recommendations  No PT follow up;Supervision for mobility/OOB     Equipment Recommendations  None recommended by PT    Recommendations for Other Services       Precautions / Restrictions Precautions Precautions: Fall Required Braces or Orthoses: Cervical Brace Cervical Brace: Hard collar;At all times Restrictions Weight Bearing Restrictions: Yes RUE Weight Bearing: Weight bear through elbow only    Mobility  Bed Mobility Overal bed mobility: Needs Assistance Bed Mobility: Rolling;Sidelying to Sit;Sit to Sidelying Rolling: Modified independent (Device/Increase time) (with rail) Sidelying to sit: Min assist;HOB elevated (with rail)     Sit to sidelying: Min assist (with rail) General bed mobility comments: Cues for technique  Transfers Overall transfer level: Needs assistance Equipment used: None Transfers: Sit to/from Stand Sit to Stand: Min guard            Ambulation/Gait Ambulation/Gait assistance: Min guard Ambulation Distance (Feet): 250 Feet Assistive device: Straight cane Gait Pattern/deviations: Step-through pattern;Decreased stride length Gait velocity: decreased Gait velocity interpretation: Below normal speed for age/gender General Gait Details: ambulated with straight cane and also without device.  Pt states he did not feel like the cane helped him, preferred no device.     Stairs            Wheelchair Mobility    Modified Rankin (Stroke Patients Only)       Balance   Sitting-balance support: No upper extremity  supported;Feet supported Sitting balance-Leahy Scale: Good     Standing balance support: No upper extremity supported Standing balance-Leahy Scale: Fair Standing balance comment: pt gaurded and slow with movements.  No balance losses noted througout session.                      Cognition Arousal/Alertness: Awake/alert Behavior During Therapy: WFL for tasks assessed/performed Overall Cognitive Status: Within Functional Limits for tasks assessed                      Exercises      General Comments General comments (skin integrity, edema, etc.): Pt alert and cooperative.  No cognitive issues noted.       Pertinent Vitals/Pain Pt reports having pain medicine earlier this AM.  Pain well controlled at this time.      Home Living                      Prior Function            PT Goals (current goals can now be found in the care plan section) Progress towards PT goals: Progressing toward goals    Frequency  Min 4X/week    PT Plan Current plan remains appropriate    End of Session Equipment Utilized During Treatment: Gait belt;Cervical collar Activity Tolerance: Patient tolerated treatment well Patient left: in bed;with call bell/phone within reach     Time: 0835-0859 PT Time Calculation (min): 24 min  Charges:  $Gait Training: 23-37 mins  G Codes:      Melvern Banker 03-14-2014, 9:12 AM Lavonia Dana, PT  [phone] 03/14/2014

## 2014-02-12 NOTE — Progress Notes (Signed)
No issues overnight. Pt reports no new symptoms, denying N/T/W.   EXAM:  BP 127/66  Pulse 94  Temp(Src) 99.5 F (37.5 C) (Oral)  Resp 20  Ht 6' (1.829 m)  Wt 77.111 kg (170 lb)  BMI 23.05 kg/m2  SpO2 96%  Awake, alert, oriented  Speech fluent, appropriate  CN grossly intact  5/5 BUE/BLE  Aspen collar in place, appears to fit well.  IMPRESSION:  73 y.o. male s/p fall with C1 burst fracture treating conservatively. Remains neurologically stable  PLAN: - Cont mgmt with Aspen collar - F/U with Dr. Sherwood Gambler in 4-6 weeks

## 2014-02-12 NOTE — Progress Notes (Signed)
ptient refuses cxr

## 2014-02-12 NOTE — Progress Notes (Signed)
Patient restless in room. Encouraged to maintain spinal alignment with independent activities. Ambulates in hallway with PT, Very good strength and balance.

## 2014-02-13 DIAGNOSIS — I509 Heart failure, unspecified: Secondary | ICD-10-CM

## 2014-02-13 DIAGNOSIS — S12000A Unspecified displaced fracture of first cervical vertebra, initial encounter for closed fracture: Principal | ICD-10-CM

## 2014-02-13 DIAGNOSIS — S060XAA Concussion with loss of consciousness status unknown, initial encounter: Secondary | ICD-10-CM

## 2014-02-13 DIAGNOSIS — S52599A Other fractures of lower end of unspecified radius, initial encounter for closed fracture: Secondary | ICD-10-CM

## 2014-02-13 DIAGNOSIS — W19XXXA Unspecified fall, initial encounter: Secondary | ICD-10-CM

## 2014-02-13 DIAGNOSIS — S0180XA Unspecified open wound of other part of head, initial encounter: Secondary | ICD-10-CM

## 2014-02-13 DIAGNOSIS — I5030 Unspecified diastolic (congestive) heart failure: Secondary | ICD-10-CM

## 2014-02-13 DIAGNOSIS — I4891 Unspecified atrial fibrillation: Secondary | ICD-10-CM

## 2014-02-13 DIAGNOSIS — S060X9A Concussion with loss of consciousness of unspecified duration, initial encounter: Secondary | ICD-10-CM

## 2014-02-13 LAB — BASIC METABOLIC PANEL
BUN: 25 mg/dL — ABNORMAL HIGH (ref 6–23)
CO2: 30 mEq/L (ref 19–32)
Calcium: 9.4 mg/dL (ref 8.4–10.5)
Chloride: 91 mEq/L — ABNORMAL LOW (ref 96–112)
Creatinine, Ser: 0.96 mg/dL (ref 0.50–1.35)
GFR calc Af Amer: >90 mL/min (ref >90)
GFR calc non Af Amer: 81 mL/min — ABNORMAL LOW (ref >90)
Glucose, Bld: 351 mg/dL — ABNORMAL HIGH (ref 70–99)
Potassium: 3.8 mEq/L (ref 3.7–5.3)
Sodium: 135 mEq/L — ABNORMAL LOW (ref 137–147)

## 2014-02-13 LAB — URINALYSIS, ROUTINE W REFLEX MICROSCOPIC
Bilirubin Urine: NEGATIVE
Glucose, UA: >1000 mg/dL — AB
Hgb urine dipstick: NEGATIVE
Ketones, ur: 40 mg/dL — AB
Leukocytes, UA: NEGATIVE
Nitrite: NEGATIVE
Protein, ur: 30 mg/dL — AB
Specific Gravity, Urine: 1.034 — ABNORMAL HIGH (ref 1.005–1.030)
Urobilinogen, UA: 1 mg/dL (ref 0.0–1.0)
pH: 7.5 (ref 5.0–8.0)

## 2014-02-13 LAB — URINE MICROSCOPIC-ADD ON

## 2014-02-13 LAB — CBC
HCT: 40.7 % (ref 39.0–52.0)
Hemoglobin: 14.4 g/dL (ref 13.0–17.0)
MCH: 32.7 pg (ref 26.0–34.0)
MCHC: 35.4 g/dL (ref 30.0–36.0)
MCV: 92.3 fL (ref 78.0–100.0)
Platelets: 205 10*3/uL (ref 150–400)
RBC: 4.41 MIL/uL (ref 4.22–5.81)
RDW: 12.5 % (ref 11.5–15.5)
WBC: 11.8 10*3/uL — ABNORMAL HIGH (ref 4.0–10.5)

## 2014-02-13 LAB — GLUCOSE, CAPILLARY
Glucose-Capillary: 349 mg/dL — ABNORMAL HIGH (ref 70–99)
Glucose-Capillary: 336 mg/dL — ABNORMAL HIGH (ref 70–99)
Glucose-Capillary: 242 mg/dL — ABNORMAL HIGH (ref 70–99)
Glucose-Capillary: 185 mg/dL — ABNORMAL HIGH (ref 70–99)

## 2014-02-13 MED ORDER — INSULIN ASPART 100 UNIT/ML ~~LOC~~ SOLN: 0.0000 [IU] | Freq: Every day | SUBCUTANEOUS | Status: DC

## 2014-02-13 MED ORDER — INSULIN ASPART 100 UNIT/ML ~~LOC~~ SOLN
0.0000 [IU] | Freq: Three times a day (TID) | SUBCUTANEOUS | Status: DC
Start: 2014-02-14 — End: 2014-02-14
  Administered 2014-02-14: 5 [IU] via SUBCUTANEOUS
  Administered 2014-02-14: 11 [IU] via SUBCUTANEOUS
  Administered 2014-02-14: 8 [IU] via SUBCUTANEOUS

## 2014-02-13 MED ORDER — MAGIC MOUTHWASH W/LIDOCAINE
10.0000 mL | Freq: Three times a day (TID) | ORAL | Status: DC
  Administered 2014-02-13 – 2014-02-14 (×3): 10 mL via ORAL
  Filled 2014-02-13 (×5): qty 10

## 2014-02-13 MED ORDER — INSULIN ASPART 100 UNIT/ML ~~LOC~~ SOLN
0.0000 [IU] | SUBCUTANEOUS | Status: DC
Start: 2014-02-13 — End: 2014-02-13
  Administered 2014-02-13: 7 [IU] via SUBCUTANEOUS

## 2014-02-13 MED ORDER — INSULIN GLARGINE 100 UNIT/ML ~~LOC~~ SOLN
16.0000 [IU] | Freq: Two times a day (BID) | SUBCUTANEOUS | Status: DC
  Administered 2014-02-13 – 2014-02-14 (×2): 16 [IU] via SUBCUTANEOUS
  Filled 2014-02-13 (×3): qty 0.16

## 2014-02-13 NOTE — Consult Note (Addendum)
[facility] Medical Consultation  Curtis Dunn [name] DOB: 1941/11/03 DOA: 02/10/2014 PCP: [name], MD   Requesting physician: Dr. Tobe Sos (surgery) Date of consultation: 02/10/2014 Reason for consultation: Multiple medical problems  Impression/Recommendations Active Problems:   Fracture dislocation of cervical spine   C1 cervical fracture   Fall   Distal radius fracture, right   Concussion   Facial laceration   Anticoagulated   Acute blood loss anemia    Fracture dislocation of cervical spine  - with multiple other traumas outlined below  - pt reports continuing to feel better  - management per primary team  - ensure adequate analgesia for symptom control -3/30 Consult placed for PT/OT/home nursing -3/30 spoke with PA Hilbert Odor (trauma surgery) and was agreed that only issuing patient on discharge is disposition issues (arranging PT/OT/24-hour care)   Leukocytosis  - WBC trend since admission 13.8 --> 10.9 --> 12.0  - with low grade fever overnight Tmax 99.5 F  - unclear source at this time  - will ask for UA and CXR to ensure no underlying infectious etiology  - Continue Xarelto 20 mg  HTN  - continue home medical regimen with Norvasc, Metoprolol, HCTZ, Lasix  - will add ACEI as per home medical regimen (Ramipril)   HLD  - continue Zocor 10 mg daily    DM type II  - Increase Lantus  16 units -Continue moderate SSI -Obtain hemoglobin A1c -Obtain lipid panel  Atrial fibrillation  - rate controlled with Metoprolol  - pt is in NSR at this time  - will resume Xarelto at this time   Diastolic CHF, grade I  - chronic with EF 60% in 2013  - continue Lasix 20 mg daily - monitor daily weights, strict I's and O's  - Admission weight = 80.3 kg, 3/30 weight (bed)= 76.3 kg    I will followup again tomorrow. Please contact me if I can be of assistance in the meanwhile. Thank you for this consultation.  Chief Complaint:  C1 fracture, right distal radius fracture, uncontrolled diabetes   HPI:  Pt is 73 yo WM PMHx HTN, HLD, DM type II, diastolic CHF (per last 2 D ECHO in 2013) requiring Lasix, atrial fibrillation (on Metoprolol and Xarelto), colon cancer and status post colectomy 10 years ago, who presented to [facility] ED earlier today after sustaining an episode of fall at home last night after a hypoglycemic event. Pt currently denies chest pain, shortness of breath, no fevers, chills, abdominal or urinary concerns. He reports discomfort in his neck with no specific alleviating factors but worse with movement. In ED, trauma work up consistent with right wrist fracture, dislocation of the mandible, a retropharyngeal hematoma, and a C1 fracture. Trauma team asked to admit for further management and TRH asked to consult for management of chronic medical problems. 3/30 patient resting in bed states confused as to discharge plan. Complains of right-sided tongue laceration sustained during fall  Review of Systems:    Past Medical History  Diagnosis Date  . Palpitations 07/16/2005    normal nuclear study  . Tachycardia   . Diabetes mellitus   . Hypertension   . Hyperlipidemia   . Colon cancer    Past Surgical History  Procedure Laterality Date  . Appendectomy  2001  . Colon resection    . Elbow fracture surgery    . Arm surgery     Social History:  reports that he has quit smoking. He does not have any smokeless  tobacco history on file. His alcohol and drug histories are not on file.  No Known Allergies Family History  Problem Relation Age of Onset  . Heart disease Father     Prior to Admission medications   Medication Sig Start Date End Date Taking? Authorizing Provider  amLODipine (NORVASC) 10 MG tablet Take 10 mg by mouth daily.   Yes Historical Provider, MD  Ascorbic Acid (VITAMIN C) 1000 MG tablet Take 1,000 mg by mouth daily.   Yes Historical Provider, MD  Chelated Magnesium 100 MG TABS Take by mouth  daily.   Yes Historical Provider, MD  Cholecalciferol 1000 UNITS capsule Take 1,000 Units by mouth daily.   Yes Historical Provider, MD  Cyanocobalamin (VITAMIN B 12 PO) Take by mouth. Taking 1000 daily    Yes Historical Provider, MD  Docosahexaenoic Acid (DHA OMEGA 3 PO) Take 1,200 mg by mouth daily.    Yes Historical Provider, MD  furosemide (LASIX) 20 MG tablet Take 20 mg by mouth daily.   Yes Historical Provider, MD  hydrochlorothiazide (HYDRODIURIL) 25 MG tablet Take 25 mg by mouth daily.   Yes Historical Provider, MD  insulin glargine (LANTUS) 100 UNIT/ML injection Inject 11 Units into the skin 2 (two) times daily.    Yes Historical Provider, MD  insulin lispro (HUMALOG) 100 UNIT/ML injection Inject 4-16 Units into the skin 3 (three) times daily before meals. Taking 16units  Am; 4 UNITS  noon and 11 UNITS units at  6 pm   Yes Historical Provider, MD  lovastatin (MEVACOR) 20 MG tablet Take 20 mg by mouth at bedtime.   Yes Historical Provider, MD  metoprolol succinate (TOPROL XL) 25 MG 24 hr tablet Take 1 tablet (25 mg total) by mouth daily. 10/25/13  Yes Darlin Coco, MD  Potassium 99 MG TABS Take by mouth.   Yes Historical Provider, MD  ramipril (ALTACE) 10 MG tablet Take 10 mg by mouth daily.   Yes Historical Provider, MD  Rivaroxaban (XARELTO) 20 MG TABS Take 1 tablet (20 mg total) by mouth daily. 01/12/13  Yes Darlin Coco, MD  Tamsulosin HCl (FLOMAX) 0.4 MG CAPS Take by mouth daily.   Yes Historical Provider, MD  vitamin E 400 UNIT capsule Take 400 Units by mouth daily.   Yes Historical Provider, MD  Zinc 30 MG CAPS Take 25 mg by mouth daily.    Yes Historical Provider, MD  zolpidem (AMBIEN) 10 MG tablet Take 10 mg by mouth at bedtime as needed.   Yes Historical Provider, MD   Physical Exam: Blood pressure 126/72, pulse 82, temperature 97.7 F (36.5 C), temperature source Oral, resp. rate 18, height 6' (1.829 m), weight 76.295 kg (168 lb 3.2 oz), SpO2 98.00%. Filed Vitals:    02/12/14 2300 02/13/14 0438 02/13/14 0440 02/13/14 1300  BP: 130/80 154/59  126/72  Pulse: 57 55  82  Temp: 98 F (36.7 C) 98.3 F (36.8 C)  97.7 F (36.5 C)  TempSrc:  Oral    Resp:  18  18  Height:      Weight:   76.295 kg (168 lb 3.2 oz)   SpO2: 95% 98%  98%    General: A./O. x4, NAD, multiple bruises over the face, neck collar in place. Moderate right sided tongue laceration  Cardiovascular: Regular rate and rhythm, S1/S2, no murmurs, no rubs, no gallops  Respiratory: Clear to auscultation bilaterally, no wheezing, no crackles, no rhonchi  Abdomen: Soft, non tender, non distended, bowel sounds present, no guarding  Extremities:  No edema, pulses DP and PT palpable bilaterally, right arm in splint    Labs on Admission:  Basic Metabolic Panel:  Recent Labs Lab 02/10/14 0335 02/11/14 0302 02/12/14 0448 02/13/14 0458  NA 142 140 132* 135*  K 3.9 4.2 4.2 3.8  CL 103 102 91* 91*  CO2 24 28 27 30   GLUCOSE 152* 188* 295* 351*  BUN 23 25* 25* 25*  CREATININE 1.09 0.92 0.85 0.96  CALCIUM 9.4 8.8 9.1 9.4   Liver Function Tests:  Recent Labs Lab 02/10/14 0335 02/11/14 0302  AST 42* 35  ALT 30 24  ALKPHOS 65 55  BILITOT 0.6 0.7  PROT 6.9 6.0  ALBUMIN 4.0 3.1*   No results found for this basename: LIPASE, AMYLASE,  in the last 168 hours No results found for this basename: AMMONIA,  in the last 168 hours CBC:  Recent Labs Lab 02/10/14 0335 02/11/14 0302 02/12/14 0448 02/13/14 0458  WBC 13.8* 10.9* 12.0* 11.8*  NEUTROABS 12.2*  --   --   --   HGB 13.5 12.8* 13.5 14.4  HCT 39.1 37.1* 39.0 40.7  MCV 92.7 94.4 93.1 92.3  PLT 194 177 166 205   Cardiac Enzymes: No results found for this basename: CKTOTAL, CKMB, CKMBINDEX, TROPONINI,  in the last 168 hours BNP: No components found with this basename: POCBNP,  CBG:  Recent Labs Lab 02/12/14 1218 02/12/14 1708 02/12/14 2202 02/13/14 0607 02/13/14 1129  GLUCAP 254* 296* 238* 349* 336*    Radiological  Exams on Admission: No results found.  EKG:  Time spent: 90 minutes  Allie Bossier [facility] Pager [phone]  If 7PM-7AM, please contact night-coverage [email or web address] Password [email or web address] 02/13/2014, 2:13 PM

## 2014-02-13 NOTE — Progress Notes (Signed)
Up in the chair, no cough. Lungs CTA. Forehead lac stable. Appreciate IM F/U - will await their eval today. Patient examined and I agree with the assessment and plan  Georganna Skeans, MD, MPH, FACS Trauma: [phone] General Surgery: [phone]  02/13/2014 12:37 PM

## 2014-02-13 NOTE — Progress Notes (Signed)
Patient ID: Curtis Dunn, male   DOB: 08-28-1941, 73 y.o.   MRN: [id number]   LOS: 3 days   Subjective: No new c/o.   Objective: Vital signs in last 24 hours: Temp:  [98 F (36.7 C)-98.5 F (36.9 C)] 98.3 F (36.8 C) (03/30 0438) Pulse Rate:  [55-102] 55 (03/30 0438) Resp:  [18-20] 18 (03/30 0438) BP: (130-154)/(59-80) 154/59 mmHg (03/30 0438) SpO2:  [95 %-99 %] 98 % (03/30 0438) Weight:  [168 lb 3.2 oz (76.295 kg)] 168 lb 3.2 oz (76.295 kg) (03/30 0440) Last BM Date: 02/10/14   Laboratory  CBC  Recent Labs  02/12/14 0448 02/13/14 0458  WBC 12.0* 11.8*  HGB 13.5 14.4  HCT 39.0 40.7  PLT 166 205   BMET  Recent Labs  02/12/14 0448 02/13/14 0458  NA 132* 135*  K 4.2 3.8  CL 91* 91*  CO2 27 30  GLUCOSE 295* 351*  BUN 25* 25*  CREATININE 0.85 0.96  CALCIUM 9.1 9.4   CBG (last 3)   Recent Labs  02/12/14 1708 02/12/14 2202 02/13/14 0607  GLUCAP 296* 238* 349*    Physical Exam General appearance: alert and no distress Resp: clear to auscultation bilaterally Cardio: regular rate and rhythm GI: normal findings: bowel sounds normal and soft, non-tender   Assessment/Plan: Fall  Concussion  Forehead lac -- Local care, Dr. Erik Obey to remove sutures  C1 fx -- Collar  Right wrist fx -- Splint. Spoke with Dr. Caralyn Guile, plan to f/u as OP.  ABL anemia -- Mild  Multiple medical problems -- per IM. Afebrile but WBC mildly and persistently elevated and CBG's have climbed over last 48h. Worrisome for infection but UA negative, no cough (pt refused CXR). FEN -- No issues  VTE -- SCD's, Lovenox  Dispo -- PT/OT recommending home w/24h supervision. Trauma and surgical issues are stable for discharge. Either d/c today but if IM wants to continue IP management suggest transfer attending to them. Will await their evaluation this am.    Lisette Abu, PA-C Pager: [phone] General Trauma PA Pager: [phone]  02/13/2014

## 2014-02-13 NOTE — Progress Notes (Signed)
Physical Therapy Treatment & Discharge Patient Details Name: Curtis Dunn MRN: [id number] DOB: 1941/11/03 Today's Date: 02/13/2014    History of Present Illness Pt s/p fall down stairs due to hypoglycemia presenting with C1 fracture being managed by aspen collar and R wrist fx managed by splint.    PT Comments    Patient is functioning at a supervision to Mod I level and no further acute PT needs identified. Stair training complete and safely demonstrates ability to navigate full flight of stairs. Pertinent education has been completed and the patient has no further questions. PT is signing off.   Follow Up Recommendations  No PT follow up;Supervision for mobility/OOB     Equipment Recommendations  None recommended by PT    Recommendations for Other Services       Precautions / Restrictions Precautions Precautions: Fall Required Braces or Orthoses: Cervical Brace Cervical Brace: Hard collar;At all times Restrictions Weight Bearing Restrictions: Yes RUE Weight Bearing: Weight bear through elbow only    Mobility  Bed Mobility Overal bed mobility: Needs Assistance Bed Mobility: Rolling;Sidelying to Sit;Sit to Sidelying Rolling: Modified independent (Device/Increase time) (with rail) Sidelying to sit: Min assist;HOB elevated (with rail)     Sit to sidelying: Min assist (with rail) General bed mobility comments: Cues for technique. Requires extra time  Transfers Overall transfer level: Needs assistance Equipment used: None Transfers: Sit to/from Stand Sit to Stand: Supervision         General transfer comment: Pt performs sit<>stand with supervision for safety. Minimal sway upon initial stand, not requiring any assist for balance.   Ambulation/Gait Ambulation/Gait assistance: Supervision Ambulation Distance (Feet): 250 Feet Assistive device: None Gait Pattern/deviations: Step-through pattern     General Gait Details: Pt ambulates without an assistive  device with supervision for safety. Pt demonstrates safe technique with no instances of LOB. Able to walk backwards and side-step safely as well. No complaints of dizziness or lightheadedness   Stairs Stairs: Yes Stairs assistance: Supervision Stair Management: One rail Left;Alternating pattern;Forwards Number of Stairs: 12 General stair comments: Safely navigates full flight of stairs with supervision. Verbal cues for pt to take his time and descend steps slowly.  Wheelchair Mobility    Modified Rankin (Stroke Patients Only)       Balance                                    Cognition Arousal/Alertness: Awake/alert Behavior During Therapy: WFL for tasks assessed/performed Overall Cognitive Status: Within Functional Limits for tasks assessed                      Exercises      General Comments General comments (skin integrity, edema, etc.): Son present in room at end of therapy. Seems concerned with pt going home alone and an argument ensued. Patient states he will have plenty of help coming in at regular time to assist him if needed.      Pertinent Vitals/Pain Pt denies any pain (no numerical value given). Pt repositioned in bed for comfort    Home Living                      Prior Function            PT Goals (current goals can now be found in the care plan section) Acute Rehab PT Goals PT Goal Formulation: With patient Time  For Goal Achievement: 02/18/14 Potential to Achieve Goals: Good Progress towards PT goals: Progressing toward goals    Frequency  Min 4X/week    PT Plan Current plan remains appropriate    End of Session Equipment Utilized During Treatment: Gait belt;Cervical collar Activity Tolerance: Patient tolerated treatment well Patient left: in bed;with call bell/phone within reach     Time: 0177-9390 PT Time Calculation (min): 20 min  Charges:  $Gait Training: 8-22 mins                    G Codes:       [name], [place]  Ellouise Newer 02/13/2014, 10:55 AM

## 2014-02-13 NOTE — Care Management Note (Signed)
    Page 1 of 1   02/14/2014     11:40:10 AM   CARE MANAGEMENT NOTE 02/14/2014  Patient:  CODEN, FRANCHI   Account Number:  [id number]  Date Initiated:  02/13/2014  Documentation initiated by:  Magdalen Spatz  Subjective/Objective Assessment:     Action/Plan:   Anticipated DC Date:  02/14/2014   Anticipated DC Plan:  [place]         Choice offered to / List presented to:  C-1 Patient        [place] arranged  [place].   Status of service:   Medicare Important Message given?   (If response is "NO", the following Medicare IM given date fields will be blank) Date Medicare IM given:   Date Additional Medicare IM given:    Discharge Disposition:    Per UR Regulation:  Reviewed for med. necessity/level of care/duration of stay  If discussed at [place] of Stay Meetings, dates discussed:    Comments:  02-14-14 Spoke with Marcie Bal at [employer] . She will come to patient's hospital room today at 1300 to do paper work , and start 24 hour assistance at home this evening . Magdalen Spatz RN BSN [phone]   02-13-14 Face sheet information confirmed with patient . He does not want a 3 in 1 . Patient arranging 24 hour supervision through [facility] . Magdalen Spatz RN BSN [phone]

## 2014-02-13 NOTE — Progress Notes (Signed)
Occupational Therapy Treatment Patient Details Name: Curtis Dunn MRN: [id number] DOB: 1941-06-09 Today's Date: 02/13/2014    History of present illness Pt s/p fall down stairs due to hypoglycemia presenting with C1 fracture being managed by aspen collar and R wrist fx managed by splint.   OT comments  Pt seen for ADL session today. Pt with no c/o dizziness or nausea during session and requested to walk with OT assistance. Pt performed grooming at sink and ambulated around his room and down the hallway per his request. Pt with improved bed mobility. Education and training provided regarding cervical brace management and purpose and completing ADLs while maintaining cervical precautions. Pt would benefit from continued acute OT to increase Independence prior to returning home, including LB ADLs.   Follow Up Recommendations  No OT follow up;Supervision/Assistance - 24 hour    Equipment Recommendations  3 in 1 bedside comode       Precautions / Restrictions Precautions Precautions: Fall Required Braces or Orthoses: Cervical Brace Cervical Brace: Hard collar;At all times Restrictions Weight Bearing Restrictions: Yes RUE Weight Bearing: Weight bear through elbow only       Mobility Bed Mobility Overal bed mobility: Needs Assistance Bed Mobility: Rolling;Sidelying to Sit;Sit to Sidelying Rolling: Modified independent (Device/Increase time) Sidelying to sit: Min guard;HOB elevated (use of handrail)     Sit to sidelying: Min guard;HOB elevated (use of hand rail)    Transfers Overall transfer level: Needs assistance Equipment used: None Transfers: Sit to/from Stand Sit to Stand: Min guard         General transfer comment: Pt moved slowly, and demonstrated some balance impairment upon standing however denied nausea or dizziness. Pt sat and waited before performing sit>stand transfer again successfully with Min G.         ADL Eating/Feeding: Set up;Bed level (HOB  raised) Grooming: Oral care;Standing;Supervision/safety       Lower Body Dressing: Moderate assistance;Sit to/from stand (cues for cervical precautions) Toilet Transfer: Min guard;Ambulation (sit<>stand from bed)     Functional mobility during ADLs: Min guard General ADL Comments: Pt performed grooming at sink and performed bed mobility with HOB raised. No c/o nausea. Educated pt on purpose of cervical brace and Independent management of collar.                 Cognition   Behavior During Therapy: WFL for tasks assessed/performed Overall Cognitive Status: Within Functional Limits for tasks assessed                                 Pertinent Vitals/ Pain       No c/o pain or nausea         Frequency Min 2X/week     Progress Toward Goals  OT Goals(current goals can now be found in the care plan section)  Progress towards OT goals: Progressing toward goals  ADL Goals Pt Will Perform Grooming: with supervision;standing;with caregiver independent in assisting Pt Will Perform Lower Body Dressing: with supervision;with caregiver independent in assisting;sit to/from stand Pt Will Transfer to Toilet: with supervision;ambulating Pt Will Perform Toileting - Clothing Manipulation and hygiene: with supervision;sit to/from stand Additional ADL Goal #1: Pt will perform bed mobility at supervision level as precursor for ADLs.    Plan Discharge plan remains appropriate    End of Session Equipment Utilized During Treatment: Gait belt;Cervical collar  Activity Tolerance Patient tolerated treatment well   Patient Left in bed;with  call bell/phone within reach         Time: [phone] OT Time Calculation (min): 37 min  Charges: OT General Charges $OT Visit: 1 Procedure OT Treatments $Self Care/Home Management : 23-37 mins  Juluis Rainier [phone] 02/13/2014, 9:41 AM

## 2014-02-13 NOTE — ED Provider Notes (Signed)
Medical screening examination/treatment/procedure(s) were conducted as a shared visit with non-physician practitioner(s) and myself.  I personally evaluated the patient during the encounter.   EKG Interpretation   Date/Time:  Friday February 10 2014 02:43:26 EDT Ventricular Rate:  74 PR Interval:  171 QRS Duration: 105 QT Interval:  441 QTC Calculation: 489 R Axis:   -38 Text Interpretation:  Sinus rhythm Incomplete RBBB and LAFB Abnormal  R-wave progression, early transition Left ventricular hypertrophy  Borderline prolonged QT interval No previous tracing Confirmed by Maryan Rued   MD, Sherley Mckenney ([id number]) on 02/10/2014 3:12:18 AM        Blanchie Dessert, MD 02/13/14 [phone]

## 2014-02-13 NOTE — Progress Notes (Signed)
Inpatient Diabetes Program Recommendations  AACE/ADA: New Consensus Statement on Inpatient Glycemic Control (2013)  Target Ranges:  Prepandial:   less than 140 mg/dL      Peak postprandial:   less than 180 mg/dL (1-2 hours)      Critically ill patients:  140 - 180 mg/dL   Results for Curtis Dunn, Curtis Dunn (MRN [id number]) as of 02/13/2014 12:11  Ref. Range 02/12/2014 07:29 02/12/2014 12:18 02/12/2014 17:08 02/12/2014 22:02 02/13/2014 06:07 02/13/2014 11:29  Glucose-Capillary Latest Range: 70-99 mg/dL 278 (H) 254 (H) 296 (H) 238 (H) 349 (H) 336 (H)   Diabetes history: DM Outpatient Diabetes medications: Lantus 11 units BID, Humalog 16 units with breakfast, 4 units with lunch, 11 units with supper Current orders for Inpatient glycemic control: Lantus 11 units BID, Novolog 0-15 units AC  Inpatient Diabetes Program Recommendations Insulin - Basal: Please consider increasing Lantus to 13 units BID. Correction (SSI): Please consider adding Novolog bedtime correction scale. Insulin - Meal Coverage: Please consider ordering Novolog 5 units TID with meals for meal coverage (in addition to Novolog correction scale). HgbA1C: Please order an A1C to evaluate glycemic control over the past 2-3 months.  Thanks, Barnie Alderman, RN, MSN, CCRN Diabetes Coordinator Inpatient Diabetes Program [phone] (Team Pager) [phone] (AP office) [phone] [facility] office)

## 2014-02-14 DIAGNOSIS — I10 Essential (primary) hypertension: Secondary | ICD-10-CM

## 2014-02-14 DIAGNOSIS — IMO0002 Reserved for concepts with insufficient information to code with codable children: Secondary | ICD-10-CM

## 2014-02-14 DIAGNOSIS — E108 Type 1 diabetes mellitus with unspecified complications: Secondary | ICD-10-CM

## 2014-02-14 DIAGNOSIS — E785 Hyperlipidemia, unspecified: Secondary | ICD-10-CM

## 2014-02-14 DIAGNOSIS — D72829 Elevated white blood cell count, unspecified: Secondary | ICD-10-CM

## 2014-02-14 DIAGNOSIS — E1065 Type 1 diabetes mellitus with hyperglycemia: Secondary | ICD-10-CM

## 2014-02-14 LAB — HEMOGLOBIN A1C
Hgb A1c MFr Bld: 5.7 % — ABNORMAL HIGH (ref <5.7)
Mean Plasma Glucose: 117 mg/dL — ABNORMAL HIGH (ref <117)

## 2014-02-14 LAB — LIPID PANEL
Cholesterol: 148 mg/dL (ref 0–200)
HDL: 48 mg/dL (ref >39)
LDL Cholesterol: 79 mg/dL (ref 0–99)
Total CHOL/HDL Ratio: 3.1 RATIO
Triglycerides: 106 mg/dL (ref <150)
VLDL: 21 mg/dL (ref 0–40)

## 2014-02-14 LAB — GLUCOSE, CAPILLARY
Glucose-Capillary: 230 mg/dL — ABNORMAL HIGH (ref 70–99)
Glucose-Capillary: 321 mg/dL — ABNORMAL HIGH (ref 70–99)
Glucose-Capillary: 279 mg/dL — ABNORMAL HIGH (ref 70–99)

## 2014-02-14 MED ORDER — HYDROCODONE-ACETAMINOPHEN 5-325 MG PO TABS: 1.0000 | ORAL_TABLET | ORAL | Status: DC | PRN

## 2014-02-14 MED ORDER — TRAMADOL HCL 50 MG PO TABS: 100.0000 mg | ORAL_TABLET | Freq: Four times a day (QID) | ORAL | Status: DC

## 2014-02-14 NOTE — Progress Notes (Signed)
Patient discharged to home. Discharge instructions and rx given and explained and patient stated understanding. IV was removed prior to discharge and patient left unit in a stable condition with all personal belongings via wheelchair.

## 2014-02-14 NOTE — Progress Notes (Signed)
Occupational Therapy Treatment Patient Details Name: Curtis Dunn MRN: [id number] DOB: [birth year] Today's Date: 02/14/2014    History of present illness Pt s/p fall down stairs due to hypoglycemia presenting with C1 fracture being managed by aspen collar and R wrist fx managed by splint.   OT comments  Pt progressing towards acute OT goals. Pt donned LBD with mod A sit/stand. Education given on safe completion of LB dressing with cervical precautions and non-weightbearing status of RUE. Logrolling, sidelying<>EOB with supervision. Pt HOB elevated for bed mobility with pt stating that it matched his home bed since he uses a lot of pillows to keep his head elevated. In-room ambulation at supervision level. Educated pt on use of reacher for retrieving items at home with pt stating he did not think he needed a Secondary school teacher.  Follow Up Recommendations  No OT follow up;Supervision/Assistance - 24 hour    Equipment Recommendations  3 in 1 bedside comode    Recommendations for Other Services      Precautions / Restrictions Precautions Precautions: Fall;Cervical Required Braces or Orthoses: Cervical Brace Cervical Brace: Hard collar;At all times Restrictions Weight Bearing Restrictions: Yes RUE Weight Bearing: Weight bear through elbow only       Mobility Bed Mobility Overal bed mobility: Needs Assistance Bed Mobility: Rolling;Sidelying to Sit Rolling: Modified independent (Device/Increase time) (HOB elevated. Pt has HOB elevated with pillows at home) Sidelying to sit: Modified independent (Device/Increase time)     Sit to sidelying: Modified independent (Device/Increase time) General bed mobility comments: Cues for technique. Requires extra time  Transfers Overall transfer level: Needs assistance Equipment used: None Transfers: Sit to/from Stand Sit to Stand: Supervision         General transfer comment: sit<> stand with supervision    Balance Overall balance  assessment: Needs assistance Sitting-balance support: Feet supported Sitting balance-Leahy Scale: Good     Standing balance support: No upper extremity supported;During functional activity Standing balance-Leahy Scale: Good                     ADL           Lower Body Dressing: Moderate assistance;Sit to/from stand   Toileting- Water quality scientist and Hygiene: Moderate assistance;Sit to/from stand   Functional mobility during ADLs: Min guard General ADL Comments: Pt performed LBD with mod A. Educated on use of reacher for retrieving items to maintain cervial precaution. Educated on putting all LBD on in initially in sitting and then stand only once to pull clothing up. Pt needed assitance with fastners due to RUE impairment.        Vision                     Perception     Praxis      Cognition   Behavior During Therapy: [facility] for tasks assessed/performed Overall Cognitive Status: Within Functional Limits for tasks assessed                       Extremity/Trunk Assessment               Exercises       General Comments      Pertinent Vitals/ Pain      6/10 pain in head. Repositioned.  Home Living  Prior Functioning/Environment              Frequency Min 2X/week     Progress Toward Goals  OT Goals(current goals can now be found in the care plan section)  Progress towards OT goals: Progressing toward goals  Acute Rehab OT Goals Patient Stated Goal: not stated OT Goal Formulation: With patient Time For Goal Achievement: 02/18/14 Potential to Achieve Goals: Good ADL Goals Pt Will Perform Grooming: with supervision;standing;with caregiver independent in assisting Pt Will Perform Lower Body Dressing: with supervision;with caregiver independent in assisting;sit to/from stand Pt Will Transfer to Toilet: with supervision;ambulating Pt Will Perform Toileting -  Clothing Manipulation and hygiene: with supervision;sit to/from stand Additional ADL Goal #1: Pt will perform bed mobility at supervision level as precursor for ADLs.    Plan Discharge plan remains appropriate    End of Session Equipment Utilized During Treatment: Gait belt;Cervical collar  Activity Tolerance Patient tolerated treatment well   Patient Left in bed;with call bell/phone within reach   Nurse Communication Other (comment) (pt inquiring about d/c timeline)        Time: 4287-6811 OT Time Calculation (min): 27 min  Charges: OT General Charges $OT Visit: 1 Procedure OT Treatments $Self Care/Home Management : 8-22 mins $Therapeutic Activity: 8-22 mins  Tyrone Schimke OTR/L Pager: [phone]  02/14/2014, 4:11 PM

## 2014-02-14 NOTE — Discharge Summary (Signed)
Physician Discharge Summary  Patient ID: Curtis Dunn MRN: [id number] DOB/AGE: [birth year] 73 y.o.  Admit date: 02/10/2014 Discharge date: 02/14/2014  Discharge Diagnoses Patient Active Problem List   Diagnosis Date Noted  . Acute blood loss anemia 02/11/2014  . Fracture dislocation of cervical spine 02/10/2014  . C1 cervical fracture 02/10/2014  . Fall 02/10/2014  . Distal radius fracture, right 02/10/2014  . Concussion 02/10/2014  . Facial laceration 02/10/2014  . Anticoagulated 02/10/2014  . PAF (paroxysmal atrial fibrillation) 09/08/2012  . Palpitations 03/05/2012  . MALIGNANT NEOPLASM OF CECUM 12/11/2007  . COLONIC POLYPS 12/11/2007  . DIABETES MELLITUS, TYPE I 12/11/2007  . HYPERTENSION 12/11/2007    Consultants Dr. Jovita Dunn for neurosurgery  Dr. Jodi Dunn for ENT  Dr. Mart Dunn for internal medicine   Procedures Repair of forehead laceration by Curtis Mead, PA-C   HPI: Curtis Dunn fell down some stairs and hit his head at the bottom. He was amnestic to the event. He was evaluated in the ED and found to have the C1 fracture and a right wrist fracture. His forehead laceration was closed in the ED. Neurosurgery, ENT, internal medicine, and hand surgery were consulted and he was admitted by the trauma service.   Hospital Course: Neurosurgery recommended non-operative treatment in a hard cervical collar. Hand surgery recommended initial splinting with outpatient follow-up. He was mobilized with physical and occupational therapies and did well. Internal medicine helped to manage his multiple medical problems including his diabetes. Toward the end of his hospital stay he developed a mild leukocytosis and elevated blood glucose levels but remained afebrile and asymptomatic. Therapies recommended 24-hour supervision and home health therapies and once he was able to arrange the supervision piece he was discharged home in stable condition.      Medication  List         amLODipine 10 MG tablet  Commonly known as:  NORVASC  Take 10 mg by mouth daily.     Chelated Magnesium 100 MG Tabs  Take by mouth daily.     Cholecalciferol 1000 UNITS capsule  Take 1,000 Units by mouth daily.     DHA OMEGA 3 PO  Take 1,200 mg by mouth daily.     furosemide 20 MG tablet  Commonly known as:  LASIX  Take 20 mg by mouth daily.     hydrochlorothiazide 25 MG tablet  Commonly known as:  HYDRODIURIL  Take 25 mg by mouth daily.     HYDROcodone-acetaminophen 5-325 MG per tablet  Commonly known as:  NORCO/VICODIN  Take 1 tablet by mouth every 4 (four) hours as needed (Pain).     insulin glargine 100 UNIT/ML injection  Commonly known as:  LANTUS  Inject 11 Units into the skin 2 (two) times daily.     insulin lispro 100 UNIT/ML injection  Commonly known as:  HUMALOG  Inject 4-16 Units into the skin 3 (three) times daily before meals. Taking 16units  Am; 4 UNITS  noon and 11 UNITS units at  6 pm     lovastatin 20 MG tablet  Commonly known as:  MEVACOR  Take 20 mg by mouth at bedtime.     metoprolol succinate 25 MG 24 hr tablet  Commonly known as:  TOPROL XL  Take 1 tablet (25 mg total) by mouth daily.     Potassium 99 MG Tabs  Take by mouth.     ramipril 10 MG tablet  Commonly known as:  ALTACE  Take 10 mg by mouth  daily.     Rivaroxaban 20 MG Tabs tablet  Commonly known as:  XARELTO  Take 1 tablet (20 mg total) by mouth daily.     tamsulosin 0.4 MG Caps capsule  Commonly known as:  FLOMAX  Take by mouth daily.     traMADol 50 MG tablet  Commonly known as:  ULTRAM  Take 2 tablets (100 mg total) by mouth 4 (four) times daily.     VITAMIN B 12 PO  Take by mouth. Taking 1000 daily     vitamin C 1000 MG tablet  Take 1,000 mg by mouth daily.     vitamin E 400 UNIT capsule  Take 400 Units by mouth daily.     Zinc 30 MG Caps  Take 25 mg by mouth daily.     zolpidem 10 MG tablet  Commonly known as:  AMBIEN  Take 10 mg by mouth at  bedtime as needed.             Follow-up Information   Schedule an appointment as soon as possible for a visit with Curtis Hoff, MD.   Specialty:  Orthopedic Surgery   Contact information:   [address] [place] [address] [phone]       Schedule an appointment as soon as possible for a visit with Curtis Marble, MD.   Specialty:  Otolaryngology   Contact information:   [address] [place] [address] [phone]       Schedule an appointment as soon as possible for a visit with Curtis Spangle, MD.   Specialty:  Neurosurgery   Contact information:   [address] [place] [address] [phone]       Schedule an appointment as soon as possible for a visit with Curtis Bolt, MD.   Specialty:  Endocrinology   Contact information:   [place] [address] [phone]       Call Curtis Dunn. (As needed)    Contact information:   [address] [place] [address] [phone]        Signed: Lisette Abu, PA-C Pager: [phone] General Trauma PA Pager: [phone] 02/14/2014, 12:06 PM

## 2014-02-14 NOTE — Progress Notes (Signed)
  Subjective: Pt wants to go home has HA  Objective: Vital signs in last 24 hours: Temp:  [97.7 F (36.5 C)-98.3 F (36.8 C)] 97.9 F (36.6 C) (03/31 0525) Pulse Rate:  [48-82] 48 (03/31 0525) Resp:  [16-18] 16 (03/31 0525) BP: (126-142)/(58-72) 142/58 mmHg (03/31 0525) SpO2:  [98 %-99 %] 98 % (03/31 0525) Weight:  [165 lb 9.6 oz (75.116 kg)] 165 lb 9.6 oz (75.116 kg) (03/31 0500) Last BM Date: 02/10/14  Intake/Output from previous day: 03/30 0701 - 03/31 0700 In: 480 [P.O.:480] Out: -  Intake/Output this shift:    General appearance: alert, cooperative and appears stated age Head: LACERATION   INTACT Extremities: SPLINT LEFT WRIST Neurologic: Grossly normal  Lab Results:   Recent Labs  02/12/14 0448 02/13/14 0458  WBC 12.0* 11.8*  HGB 13.5 14.4  HCT 39.0 40.7  PLT 166 205   BMET  Recent Labs  02/12/14 0448 02/13/14 0458  NA 132* 135*  K 4.2 3.8  CL 91* 91*  CO2 27 30  GLUCOSE 295* 351*  BUN 25* 25*  CREATININE 0.85 0.96  CALCIUM 9.1 9.4   PT/INR No results found for this basename: LABPROT, INR,  in the last 72 hours ABG No results found for this basename: PHART, PCO2, PO2, HCO3,  in the last 72 hours  Studies/Results: No results found.  Anti-infectives: Anti-infectives   None      Assessment/Plan:  LOS: 4 days  Fall  Concussion  Forehead lac -- Local care, Dr. Erik Obey to remove sutures  C1 fx -- Collar  Right wrist fx -- Splint. Spoke with Dr. Caralyn Guile, plan to f/u as OP.  ABL anemia -- Mild  Multiple medical problems -- per IM. Afebrile but WBC mildly and persistently elevated and CBG's have climbed over last 48h. Worrisome for infection but UA negative, no cough (pt refused CXR).  FEN -- No issues  VTE -- SCD's, Lovenox  Dispo -- PT/OT recommending home w/24h supervision. Trauma and surgical issues are stable for discharge. WILL FOLLOW UP THIS AM ON ASSISTANCE.  HOPEFULLY HOME LATER TODAY  Kanika Bungert A. 02/14/2014

## 2014-02-14 NOTE — Discharge Summary (Signed)
Discharge home today

## 2014-02-14 NOTE — Consult Note (Signed)
[facility] Medical Consultation  DAMAREON LANNI OHY:[id number] DOB: [birth year] DOA: 02/10/2014 PCP: [name], MD   Requesting physician: Dr. Tobe Sos (surgery) Date of consultation: 02/10/2014 Reason for consultation: Multiple medical problems  Impression/Recommendations Active Problems:   Fracture dislocation of cervical spine   C1 cervical fracture   Fall   Distal radius fracture, right   Concussion   Facial laceration   Anticoagulated   Acute blood loss anemia    Fracture dislocation of cervical spine  - with multiple other traumas outlined below  - pt reports continuing to feel better  - management per primary team  - ensure adequate analgesia for symptom control -3/30 Consult placed for PT/OT/home nursing -3/30 spoke with PA Hilbert Odor (trauma surgery) and was agreed that only issuing patient on discharge is disposition issues (arranging PT/OT/24-hour care)    Leukocytosis  - WBC trend since admission 13.8 --> 10.9 --> 12.0  - with low grade fever overnight Tmax 99.5 F  - unclear source at this time  - will ask for UA and CXR to ensure no underlying infectious etiology  - Continue Xarelto 20 mg  HTN  - continue home medical regimen with Norvasc, Metoprolol, HCTZ, Lasix  - will add ACEI as per home medical regimen (Ramipril)   HLD  - continue Zocor 10 mg daily    DM type II  - Increase Lantus  16 units -Continue moderate SSI -Obtain hemoglobin A1c -Obtain lipid panel -3/31 Reviewed medication w/ Pt. Will sign off, as Pt is being D/Ced by Trauma Surgery  Atrial fibrillation  - rate controlled with Metoprolol  - pt is in NSR at this time  - will resume Xarelto at this time   Diastolic CHF, grade I  - chronic with EF 60% in 2013  - continue Lasix 20 mg daily - monitor daily weights, strict I's and O's  - Admission weight = 80.3 kg, 3/30 weight (bed)= 76.3 kg    I will followup again tomorrow. Please contact me if I  can be of assistance in the meanwhile. Thank you for this consultation.  Chief Complaint: C1 fracture, right distal radius fracture, uncontrolled diabetes   HPI:  Pt is 73 yo WM PMHx HTN, HLD, DM type II, diastolic CHF (per last 2 D ECHO in 2013) requiring Lasix, atrial fibrillation (on Metoprolol and Xarelto), colon cancer and status post colectomy 10 years ago, who presented to [facility] ED earlier today after sustaining an episode of fall at home last night after a hypoglycemic event. Pt currently denies chest pain, shortness of breath, no fevers, chills, abdominal or urinary concerns. He reports discomfort in his neck with no specific alleviating factors but worse with movement. In ED, trauma work up consistent with right wrist fracture, dislocation of the mandible, a retropharyngeal hematoma, and a C1 fracture. Trauma team asked to admit for further management and TRH asked to consult for management of chronic medical problems. 3/30 patient resting in bed states confused as to discharge plan. Complains of right-sided tongue laceration sustained during fall 3/31 Pt stable and ready for D/C  Review of Systems:    Past Medical History  Diagnosis Date  . Palpitations 07/16/2005    normal nuclear study  . Tachycardia   . Diabetes mellitus   . Hypertension   . Hyperlipidemia   . Colon cancer    Past Surgical History  Procedure Laterality Date  . Appendectomy  2001  . Colon resection    . Elbow fracture surgery    .  Arm surgery     Social History:  reports that he has quit smoking. He does not have any smokeless tobacco history on file. His alcohol and drug histories are not on file.  No Known Allergies Family History  Problem Relation Age of Onset  . Heart disease Father     Prior to Admission medications   Medication Sig Start Date End Date Taking? Authorizing Provider  amLODipine (NORVASC) 10 MG tablet Take 10 mg by mouth daily.   Yes Historical Provider, MD  Ascorbic Acid (VITAMIN C)  1000 MG tablet Take 1,000 mg by mouth daily.   Yes Historical Provider, MD  Chelated Magnesium 100 MG TABS Take by mouth daily.   Yes Historical Provider, MD  Cholecalciferol 1000 UNITS capsule Take 1,000 Units by mouth daily.   Yes Historical Provider, MD  Cyanocobalamin (VITAMIN B 12 PO) Take by mouth. Taking 1000 daily    Yes Historical Provider, MD  Docosahexaenoic Acid (DHA OMEGA 3 PO) Take 1,200 mg by mouth daily.    Yes Historical Provider, MD  furosemide (LASIX) 20 MG tablet Take 20 mg by mouth daily.   Yes Historical Provider, MD  hydrochlorothiazide (HYDRODIURIL) 25 MG tablet Take 25 mg by mouth daily.   Yes Historical Provider, MD  insulin glargine (LANTUS) 100 UNIT/ML injection Inject 11 Units into the skin 2 (two) times daily.    Yes Historical Provider, MD  insulin lispro (HUMALOG) 100 UNIT/ML injection Inject 4-16 Units into the skin 3 (three) times daily before meals. Taking 16units  Am; 4 UNITS  noon and 11 UNITS units at  6 pm   Yes Historical Provider, MD  lovastatin (MEVACOR) 20 MG tablet Take 20 mg by mouth at bedtime.   Yes Historical Provider, MD  metoprolol succinate (TOPROL XL) 25 MG 24 hr tablet Take 1 tablet (25 mg total) by mouth daily. 10/25/13  Yes Darlin Coco, MD  Potassium 99 MG TABS Take by mouth.   Yes Historical Provider, MD  ramipril (ALTACE) 10 MG tablet Take 10 mg by mouth daily.   Yes Historical Provider, MD  Rivaroxaban (XARELTO) 20 MG TABS Take 1 tablet (20 mg total) by mouth daily. 01/12/13  Yes Darlin Coco, MD  Tamsulosin HCl (FLOMAX) 0.4 MG CAPS Take by mouth daily.   Yes Historical Provider, MD  vitamin E 400 UNIT capsule Take 400 Units by mouth daily.   Yes Historical Provider, MD  Zinc 30 MG CAPS Take 25 mg by mouth daily.    Yes Historical Provider, MD  zolpidem (AMBIEN) 10 MG tablet Take 10 mg by mouth at bedtime as needed.   Yes Historical Provider, MD   Physical Exam: Blood pressure 142/58, pulse 48, temperature 97.9 F (36.6 C),  temperature source Oral, resp. rate 16, height 6' (1.829 m), weight 75.116 kg (165 lb 9.6 oz), SpO2 98.00%. Filed Vitals:   02/13/14 1300 02/13/14 2116 02/14/14 0500 02/14/14 0525  BP: 126/72 128/67  142/58  Pulse: 82 74  48  Temp: 97.7 F (36.5 C) 98.3 F (36.8 C)  97.9 F (36.6 C)  TempSrc:      Resp: 18 18  16   Height:      Weight:   75.116 kg (165 lb 9.6 oz)   SpO2: 98% 99%  98%    General: A./O. x4, NAD, multiple bruises over the face, neck collar in place. Moderate right sided tongue laceration  Cardiovascular: Regular rate and rhythm, S1/S2, no murmurs, no rubs, no gallops  Respiratory: Clear to auscultation  bilaterally, no wheezing, no crackles, no rhonchi  Abdomen: Soft, non tender, non distended, bowel sounds present, no guarding  Extremities: No edema, pulses DP and PT palpable bilaterally, right arm in splint    Labs on Admission:  Basic Metabolic Panel:  Recent Labs Lab 02/10/14 0335 02/11/14 0302 02/12/14 0448 02/13/14 0458  NA 142 140 132* 135*  K 3.9 4.2 4.2 3.8  CL 103 102 91* 91*  CO2 24 28 27 30   GLUCOSE 152* 188* 295* 351*  BUN 23 25* 25* 25*  CREATININE 1.09 0.92 0.85 0.96  CALCIUM 9.4 8.8 9.1 9.4   Liver Function Tests:  Recent Labs Lab 02/10/14 0335 02/11/14 0302  AST 42* 35  ALT 30 24  ALKPHOS 65 55  BILITOT 0.6 0.7  PROT 6.9 6.0  ALBUMIN 4.0 3.1*   No results found for this basename: LIPASE, AMYLASE,  in the last 168 hours No results found for this basename: AMMONIA,  in the last 168 hours CBC:  Recent Labs Lab 02/10/14 0335 02/11/14 0302 02/12/14 0448 02/13/14 0458  WBC 13.8* 10.9* 12.0* 11.8*  NEUTROABS 12.2*  --   --   --   HGB 13.5 12.8* 13.5 14.4  HCT 39.1 37.1* 39.0 40.7  MCV 92.7 94.4 93.1 92.3  PLT 194 177 166 205   Cardiac Enzymes: No results found for this basename: CKTOTAL, CKMB, CKMBINDEX, TROPONINI,  in the last 168 hours BNP: No components found with this basename: POCBNP,  CBG:  Recent Labs Lab  02/13/14 1129 02/13/14 1618 02/13/14 2133 02/14/14 0647 02/14/14 1104  GLUCAP 336* 242* 185* 230* 321*    Radiological Exams on Admission: No results found.  EKG:  Time spent: 35 minutes  Allie Bossier [facility] Pager [phone]  If 7PM-7AM, please contact night-coverage [email or web address] Password [facility] 02/14/2014, 4:44 PM

## 2014-02-14 NOTE — Clinical Social Work Note (Signed)
Clinical Social Work Department BRIEF PSYCHOSOCIAL ASSESSMENT 02/14/2014  Patient:  Curtis Dunn, Curtis Dunn     Account Number:  [id number]     Admit date:  02/10/2014  Clinical Social Worker:  Myles Lipps  Date/Time:  02/13/2014 04:40 PM  Referred by:  Physician  Date Referred:  02/13/2014 Referred for  Psychosocial assessment   Other Referral:   Interview type:  Patient Other interview type:   No family/friends at bedside    PSYCHOSOCIAL DATA Living Status:  ALONE Admitted from facility:   Level of care:   Primary support name:  Dougles, Kimmey  [phone] Primary support relationship to patient:  CHILD, ADULT Degree of support available:   Strong    CURRENT CONCERNS Current Concerns  Other - See comment   Other Concerns:   Patient safety at home  - requiring 24 hour supervision    SOCIAL WORK ASSESSMENT / PLAN Clinical Social Worker met with patient at bedside to offer support and discuss patient needs at discharge.  Patient states that he lives at home alone with his cat and plans to return home at discharge.  Patient thinks that the fall down the stairs occurred due to blood sugar issues. Patient has made arrangements with [facility] who provide private duty sitters at discharge.  Hospitalist MD has ordered home PT/OT - there is concern that patient insurance may not cover home health due to PT/OT recommendations for no further follow up.    Clinical Social Worker inquired about current substance use.  Patient states that he does not drink alcohol or use drugs due to his Diabetes and several medications.  Patient with no concerns at this time.  SBIRT completed and no resources needed.  Patient will likely discharge home today.  CSW signing off at this time.  Please reconsult if further needs arise prior to discharge.   Assessment/plan status:  No Further Intervention Required Other assessment/ plan:   Information/referral to community resources:   Patient  has already made arrangements with [facility] for private duty sitters and CM arranging home health    PATIENT'S/FAMILY'S RESPONSE TO PLAN OF CARE: Patient alert and oriented x3 laying in bed.  Patient with no family currently at bedside but states that his family will offer as much support as he needs at discharge. Patient feels confident that he can return home at discharge with current services in place.  Patient expresses frustrations about not discharging today due to disposition issues.  CM/CS spoke with PA regarding patient insurance limitations.  Patient is hopeful for discharge 03/31.  Patient verbalized his appreciation for CSW support and concern.

## 2014-02-14 NOTE — Discharge Instructions (Signed)
Keep collar on at all times.  Keep wrist splint on and dry. No lifting with that arm.  No driving until cleared by your medical team.  Wash wounds daily with soap and water. Apply antibiotic ointment (e.g. Neosporin) twice daily and as needed to keep moist.

## 2014-02-23 ENCOUNTER — Ambulatory Visit (INDEPENDENT_AMBULATORY_CARE_PROVIDER_SITE_OTHER): Payer: Medicare Other | Admitting: Cardiology

## 2014-02-23 ENCOUNTER — Other Ambulatory Visit: Payer: Self-pay | Admitting: Cardiology

## 2014-02-23 ENCOUNTER — Encounter: Payer: Self-pay | Admitting: Cardiology

## 2014-02-23 VITALS — BP 143/71 | HR 98 | Ht 72.0 in | Wt 165.8 lb

## 2014-02-23 DIAGNOSIS — I480 Paroxysmal atrial fibrillation: Secondary | ICD-10-CM

## 2014-02-23 DIAGNOSIS — I4891 Unspecified atrial fibrillation: Secondary | ICD-10-CM

## 2014-02-23 DIAGNOSIS — E109 Type 1 diabetes mellitus without complications: Secondary | ICD-10-CM

## 2014-02-23 DIAGNOSIS — I10 Essential (primary) hypertension: Secondary | ICD-10-CM

## 2014-02-23 NOTE — Patient Instructions (Signed)
Your physician recommends that you continue on your current medications as directed. Please refer to the Current Medication list given to you today.  Your physician wants you to follow-up in: [place] will receive a reminder letter in the mail two months in advance. If you don't receive a letter, please call our office to schedule the follow-up appointment.

## 2014-02-23 NOTE — Assessment & Plan Note (Signed)
The patient himself is not aware of his heart rate.  EKG today shows that he is in atrial flutter fibrillation with controlled ventricular response.  Fortunately he is back on his Xarelto now.  He has not had any TIA or stroke symptoms

## 2014-02-23 NOTE — Progress Notes (Signed)
Curtis Dunn Date of Birth:  August 11, 1941 [address] [place], [place]  [address] [phone]  Fax   [phone]  HPI: This pleasant 73 year old gentleman is seen for followup office visit.  He has a history of paroxysmal atrial fibrillation.[name] He does not have any history of ischemic heart disease. He had a normal nuclear treadmill stress test in 2006 and at that time showed no evidence of ischemia and he had an ejection fraction of 57%.  We saw him after he went  in for a routine visit with his primary care provider and it was noted that he had an irregular pulse. EKG was done which confirmed that he was in atrial fibrillation with a ventricular response 91. The patient was placed on Pradaxa 150 mg twice a day for long-term anticoagulation for his atrial fibrillation. Subsequently he has been switched to Xarelto. The patient has not been expressing any symptoms of chest pain or shortness of breath. He goes to the gym on a regular basis for working out. He himself is not aware of his heartbeat unless he checks it with the machine at the gym which demonstrates that his pulse is irregular. Since going on the anticoagulation he has not been experiencing any excessive bruising or bleeding. The patient had an echocardiogram 09/09/12 showing ejection fraction 60-65% with grade 1 diastolic dysfunction and a pulmonary artery pressure of 35 and no significant valve abnormalities.  Since we last saw him he had a hypoglycemic episode at home and blacked out and fell down his stairs injuring his head and suffering a neck fracture and a right wrist fracture.  He was hospitalized from 02/10/14 until 02/14/14 on the trauma service.  He was on telemetry.  He was seen by internal medicine who noted that he was in normal sinus rhythm while in the hospital.  Because of his excessive injuries and bleeding his Xarelto was held temporarily but he is back on it now.   Current Outpatient Prescriptions    Medication Sig Dispense Refill  . amLODipine (NORVASC) 10 MG tablet Take 10 mg by mouth daily.      . Ascorbic Acid (VITAMIN C) 1000 MG tablet Take 1,000 mg by mouth daily.      . Chelated Magnesium 100 MG TABS Take by mouth daily.      . Cholecalciferol 1000 UNITS capsule Take 1,000 Units by mouth daily.      . Cyanocobalamin (VITAMIN B 12 PO) Take by mouth. Taking 1000 daily       . Docosahexaenoic Acid (DHA OMEGA 3 PO) Take 1,200 mg by mouth daily.       . furosemide (LASIX) 20 MG tablet Take 20 mg by mouth daily.      . hydrochlorothiazide (HYDRODIURIL) 25 MG tablet Take 25 mg by mouth daily.      [name] HYDROcodone-acetaminophen (NORCO/VICODIN) 5-325 MG per tablet Take 1 tablet by mouth every 4 (four) hours as needed (Pain).  30 tablet  0  . insulin glargine (LANTUS) 100 UNIT/ML injection Inject 11 Units into the skin 2 (two) times daily.       . insulin lispro (HUMALOG) 100 UNIT/ML injection Inject 4-16 Units into the skin 3 (three) times daily before meals. Taking 16units  Am; 4 UNITS  noon and 11 UNITS units at  6 pm      . lovastatin (MEVACOR) 20 MG tablet Take 20 mg by mouth at bedtime.      . metoprolol succinate (TOPROL XL)  25 MG 24 hr tablet Take 1 tablet (25 mg total) by mouth daily.  90 tablet  3  . Potassium 99 MG TABS Take by mouth.      . ramipril (ALTACE) 10 MG tablet Take 10 mg by mouth daily.      . Tamsulosin HCl (FLOMAX) 0.4 MG CAPS Take by mouth daily.      . traMADol (ULTRAM) 50 MG tablet Take 2 tablets (100 mg total) by mouth 4 (four) times daily.  100 tablet  0  . vitamin E 400 UNIT capsule Take 400 Units by mouth daily.      . Zinc 30 MG CAPS Take 25 mg by mouth daily.       [name] zolpidem (AMBIEN) 10 MG tablet Take 10 mg by mouth at bedtime as needed.      Alveda Reasons 20 MG TABS tablet TAKE 1 TABLET EVERY DAY  30 tablet  11   No current facility-administered medications for this visit.    No Known Allergies  Patient Active Problem List   Diagnosis Date Noted  . Acute  blood loss anemia 02/11/2014  . Fracture dislocation of cervical spine 02/10/2014  . C1 cervical fracture 02/10/2014  . Fall 02/10/2014  . Distal radius fracture, right 02/10/2014  . Concussion 02/10/2014  . Facial laceration 02/10/2014  . Anticoagulated 02/10/2014  . PAF (paroxysmal atrial fibrillation) 09/08/2012  . Palpitations 03/05/2012  . MALIGNANT NEOPLASM OF CECUM 12/11/2007  . COLONIC POLYPS 12/11/2007  . DIABETES MELLITUS, TYPE I 12/11/2007  . HYPERTENSION 12/11/2007    History  Smoking status  . Former Smoker  Smokeless tobacco  . Not on file    History  Alcohol Use: Not on file    Family History  Problem Relation Age of Onset  . Heart disease Father     Review of Systems: The patient denies any heat or cold intolerance.  No weight gain or weight loss.  The patient denies headaches or blurry vision.  There is no cough or sputum production.  The patient denies dizziness.  There is no hematuria or hematochezia.  The patient denies any muscle aches or arthritis.  The patient denies any rash.  The patient denies frequent falling or instability.  There is no history of depression or anxiety.  All other systems were reviewed and are negative.   Physical Exam: Filed Vitals:   02/23/14 1619  BP: 143/71  Pulse: 98   the general appearance is that of a well-developed well-nourished middle-aged gentleman in no distress.The head and neck exam reveals pupils equal and reactive.  Extraocular movements are full.  There is no scleral icterus.  The mouth and pharynx are normal.  The neck is supple.  The carotids reveal no bruits.  The jugular venous pressure is normal.  The  thyroid is not enlarged.  There is no lymphadenopathy.  The chest is clear to percussion and auscultation.  There are no rales or rhonchi.  Expansion of the chest is symmetrical.  The precordium is quiet.  The first heart sound is normal.  The second heart sound is physiologically split.  There is no murmur  gallop rub or click.  There is no abnormal lift or heave.  The abdomen is soft and nontender.  The bowel sounds are normal.  The liver and spleen are not enlarged.  There are no abdominal masses.  There are no abdominal bruits.  Extremities reveal good pedal pulses.  There is no phlebitis or edema.  There is  no cyanosis or clubbing.  Strength is normal and symmetrical in all extremities.  There is no lateralizing weakness.  There are no sensory deficits.  The skin is warm and dry.  There is no rash.  EKG shows atrial flutter fibrillation with controlled ventricular response at 98 per minute.    Assessment / Plan: Continue on same medication.  Recheck in 4 months for office visit and EKG.

## 2014-02-23 NOTE — Assessment & Plan Note (Signed)
Blood pressure is staying stable on current therapy.  No dyspnea.  No chest pain or palpitations.

## 2014-02-23 NOTE — Assessment & Plan Note (Signed)
The patient has had no further episodes of hypoglycemia since his episode on 02/10/14 which led to his hospitalization

## 2014-06-01 ENCOUNTER — Encounter: Payer: Self-pay | Admitting: Internal Medicine

## 2014-06-14 ENCOUNTER — Telehealth: Payer: Self-pay | Admitting: Internal Medicine

## 2014-06-15 NOTE — Telephone Encounter (Signed)
Spoke with patient and he is due for recall due to suboptimal prep in 2012. He wants to schedule in November. He will call back mid August to schedule.

## 2014-06-29 ENCOUNTER — Encounter: Payer: Self-pay | Admitting: Cardiology

## 2014-06-29 ENCOUNTER — Ambulatory Visit (INDEPENDENT_AMBULATORY_CARE_PROVIDER_SITE_OTHER): Payer: Medicare Other | Admitting: Cardiology

## 2014-06-29 VITALS — BP 133/52 | HR 56 | Ht 72.0 in | Wt 163.0 lb

## 2014-06-29 DIAGNOSIS — I10 Essential (primary) hypertension: Secondary | ICD-10-CM

## 2014-06-29 DIAGNOSIS — I480 Paroxysmal atrial fibrillation: Secondary | ICD-10-CM

## 2014-06-29 DIAGNOSIS — S12000D Unspecified displaced fracture of first cervical vertebra, subsequent encounter for fracture with routine healing: Secondary | ICD-10-CM

## 2014-06-29 DIAGNOSIS — IMO0002 Reserved for concepts with insufficient information to code with codable children: Secondary | ICD-10-CM

## 2014-06-29 DIAGNOSIS — E109 Type 1 diabetes mellitus without complications: Secondary | ICD-10-CM

## 2014-06-29 DIAGNOSIS — I4891 Unspecified atrial fibrillation: Secondary | ICD-10-CM

## 2014-06-29 NOTE — Patient Instructions (Signed)
Your physician recommends that you continue on your current medications as directed. Please refer to the Current Medication list given to you today.  Your physician recommends that you schedule a follow-up appointment in: 4 month ov  

## 2014-06-29 NOTE — Progress Notes (Signed)
Curtis Dunn Date of Birth:  Oct 10, 1941 [place], [place]  [address] [phone]        Fax   [phone]   History of Present Illness: This pleasant 73 year old gentleman is seen for followup office visit. He has a history of paroxysmal atrial fibrillation.Curtis Dunn He does not have any history of ischemic heart disease. He had a normal nuclear treadmill stress test in 2006 and at that time showed no evidence of ischemia and he had an ejection fraction of 57%. We saw him after he went in for a routine visit with his primary care provider and it was noted that he had an irregular pulse. EKG was done which confirmed that he was in atrial fibrillation with a ventricular response 91. The patient was placed on Pradaxa 150 mg twice a day for long-term anticoagulation for his atrial fibrillation. Subsequently he has been switched to Xarelto. The patient has not been expressing any symptoms of chest pain or shortness of breath. He goes to the gym on a regular basis for working out. He himself is not aware of his heartbeat unless he checks it with the machine at the gym which demonstrates that his pulse is irregular. Since going on the anticoagulation he has not been experiencing any excessive bruising or bleeding. The patient had an echocardiogram 09/09/12 showing ejection fraction 60-65% with grade 1 diastolic dysfunction and a pulmonary artery pressure of 35 and no significant valve abnormalities.  The patient had a hypoglycemic episode at home and blacked out and fell down his stairs injuring his head and suffering a neck fracture and a right wrist fracture. He was hospitalized from 02/10/14 until 02/14/14 on the trauma service.  He is still in a neck brace for another several weeks   Current Outpatient Prescriptions  Medication Sig Dispense Refill  . amLODipine (NORVASC) 10 MG tablet Take 10 mg by mouth daily.      . Ascorbic Acid (VITAMIN C) 1000 MG  tablet Take 1,000 mg by mouth daily.      . Chelated Magnesium 100 MG TABS Take by mouth daily.      . Cholecalciferol 1000 UNITS capsule Take 1,000 Units by mouth daily.      . Cyanocobalamin (VITAMIN B 12 PO) Take by mouth. Taking 1000 daily       . Docosahexaenoic Acid (DHA OMEGA 3 PO) Take 1,200 mg by mouth daily.       . furosemide (LASIX) 20 MG tablet Take 20 mg by mouth daily.      . hydrochlorothiazide (HYDRODIURIL) 25 MG tablet Take 25 mg by mouth daily.      . insulin glargine (LANTUS) 100 UNIT/ML injection Inject 11 Units into the skin 2 (two) times daily.       . insulin lispro (HUMALOG) 100 UNIT/ML injection Inject 4-16 Units into the skin 3 (three) times daily before meals. Taking 16units  Am; 4 UNITS  noon and 11 UNITS units at  6 pm      . lovastatin (MEVACOR) 20 MG tablet Take 20 mg by mouth at bedtime.      . metoprolol succinate (TOPROL XL) 25 MG 24 hr tablet Take 1 tablet (25 mg total) by mouth daily.  90 tablet  3  . Potassium 99 MG TABS Take by mouth.      . ramipril (ALTACE) 10 MG tablet Take 10 mg by mouth daily.      . Tamsulosin HCl (FLOMAX)  0.4 MG CAPS Take by mouth daily.      . vitamin E 400 UNIT capsule Take 400 Units by mouth daily.      Alveda Reasons 20 MG TABS tablet TAKE 1 TABLET EVERY DAY  30 tablet  11  . Zinc 30 MG CAPS Take 25 mg by mouth daily.       Curtis Dunn zolpidem (AMBIEN) 10 MG tablet Take 10 mg by mouth at bedtime as needed.       No current facility-administered medications for this visit.    No Known Allergies  Patient Active Problem List   Diagnosis Date Noted  . Acute blood loss anemia 02/11/2014  . Fracture dislocation of cervical spine 02/10/2014  . C1 cervical fracture 02/10/2014  . Fall 02/10/2014  . Distal radius fracture, right 02/10/2014  . Concussion 02/10/2014  . Facial laceration 02/10/2014  . Anticoagulated 02/10/2014  . PAF (paroxysmal atrial fibrillation) 09/08/2012  . Palpitations 03/05/2012  . MALIGNANT NEOPLASM OF CECUM  12/11/2007  . COLONIC POLYPS 12/11/2007  . DIABETES MELLITUS, TYPE I 12/11/2007  . HYPERTENSION 12/11/2007    History  Smoking status  . Former Smoker  Smokeless tobacco  . Not on file    History  Alcohol Use: Not on file    Family History  Problem Relation Age of Onset  . Heart disease Father     Review of Systems: Constitutional: no fever chills diaphoresis or fatigue or change in weight.  Head and neck: no hearing loss, no epistaxis, no photophobia or visual disturbance. Respiratory: No cough, shortness of breath or wheezing. Cardiovascular: No chest pain peripheral edema, palpitations. Gastrointestinal: No abdominal distention, no abdominal pain, no change in bowel habits hematochezia or melena. Genitourinary: No dysuria, no frequency, no urgency, no nocturia. Musculoskeletal:No arthralgias, no back pain, no gait disturbance or myalgias. Neurological: No dizziness, no headaches, no numbness, no seizures, no syncope, no weakness, no tremors. Hematologic: No lymphadenopathy, no easy bruising. Psychiatric: No confusion, no hallucinations, no sleep disturbance.    Physical Exam: Filed Vitals:   06/29/14 1405  BP: 133/52  Pulse: 56   the general appearance reveals a well-developed well-nourished gentleman in no distress.The head and neck exam reveals pupils equal and reactive.  Extraocular movements are full.  There is no scleral icterus.  The mouth and pharynx are normal.  The neck is supple.  The carotids reveal no bruits.  The jugular venous pressure is normal.  The  thyroid is not enlarged.  There is no lymphadenopathy.  The chest is clear to percussion and auscultation.  There are no rales or rhonchi.  Expansion of the chest is symmetrical.  The precordium is quiet.  The first heart sound is normal.  The second heart sound is physiologically split.  There is no murmur gallop rub or click.  There is no abnormal lift or heave.  The abdomen is soft and nontender.  The bowel  sounds are normal.  The liver and spleen are not enlarged.  There are no abdominal masses.  There are no abdominal bruits.  Extremities reveal good pedal pulses.  There is no phlebitis or edema.  There is no cyanosis or clubbing.  Strength is normal and symmetrical in all extremities.  There is no lateralizing weakness.  There are no sensory deficits.  The skin is warm and dry.  There is no rash.  EKG shows sinus bradycardia, left axis deviation, and moderate voltage for LVH  Assessment / Plan: 1. paroxysmal atrial fibrillation 2. cervical C1  fracture, improving 3. type I insulin diabetes 4. essential hypertension  Disposition continue same medication recheck in 4 months

## 2014-06-29 NOTE — Assessment & Plan Note (Signed)
He will be getting out of his neck brace at the end of this month.  On September 1 he is leaving for a cruise to [place]

## 2014-06-29 NOTE — Assessment & Plan Note (Signed)
The patient has not been aware of any atrial fibrillation or other arrhythmias.

## 2014-06-29 NOTE — Assessment & Plan Note (Signed)
No chest pain or shortness of breath.  No dizziness or syncope.

## 2014-08-09 ENCOUNTER — Encounter: Payer: Self-pay | Admitting: Nurse Practitioner

## 2014-08-21 ENCOUNTER — Telehealth: Payer: Self-pay | Admitting: *Deleted

## 2014-08-21 ENCOUNTER — Ambulatory Visit (INDEPENDENT_AMBULATORY_CARE_PROVIDER_SITE_OTHER): Payer: Medicare Other | Admitting: Nurse Practitioner

## 2014-08-21 ENCOUNTER — Encounter: Payer: Self-pay | Admitting: Nurse Practitioner

## 2014-08-21 VITALS — BP 130/60 | HR 77 | Ht 72.0 in | Wt 163.4 lb

## 2014-08-21 DIAGNOSIS — Z85038 Personal history of other malignant neoplasm of large intestine: Secondary | ICD-10-CM

## 2014-08-21 MED ORDER — MOVIPREP 100 G PO SOLR: 1.0000 | Freq: Once | ORAL | Status: DC

## 2014-08-21 NOTE — Progress Notes (Signed)
HPI :  Patient is a 73 year old male known to Dr. Olevia Perches for a history of colon cancer in 2001, status post right hemicolectomy.  His last surveillance colonoscopy in February 2012 was unremarkable though the prep was poor. Patient was supposed to have a surveillance colonoscopy in February of this year but this got delayed as patientpatient comes in today to discuss scheduling his surveillance colonoscopy. No gastrointestinal complaints. Specifically, no bowel changes, blood in stool, abdominal pain or weight loss.   Past Medical History  Diagnosis Date  . Palpitations 07/16/2005    normal nuclear study  . Tachycardia   . Diabetes mellitus   . Hypertension   . Hyperlipidemia   . Colon cancer   . Colon polyp   . Broken neck     Family History  Problem Relation Age of Onset  . Heart disease Father   . Colon cancer Neg Hx    History  Substance Use Topics  . Smoking status: Former Research scientist (life sciences)  . Smokeless tobacco: Never Used  . Alcohol Use: No   Current Outpatient Prescriptions  Medication Sig Dispense Refill  . amLODipine (NORVASC) 10 MG tablet Take 10 mg by mouth daily.      . Ascorbic Acid (VITAMIN C) 1000 MG tablet Take 1,000 mg by mouth daily.      . Chelated Magnesium 100 MG TABS Take by mouth daily.      . Cyanocobalamin (VITAMIN B 12 PO) Take by mouth. Taking 1000 daily       . Docosahexaenoic Acid (DHA OMEGA 3 PO) Take 1,200 mg by mouth daily.       . furosemide (LASIX) 20 MG tablet Take 20 mg by mouth daily.      . hydrochlorothiazide (HYDRODIURIL) 25 MG tablet Take 25 mg by mouth daily.      . insulin glargine (LANTUS) 100 UNIT/ML injection Inject 10 Units into the skin 2 (two) times daily.       . insulin lispro (HUMALOG) 100 UNIT/ML injection Inject 4-16 Units into the skin 3 (three) times daily before meals. Taking 12 units  Am; 8 UNITS  noon and 12 UNITS units at  6 pm      . lovastatin (MEVACOR) 20 MG tablet Take 20 mg by mouth at bedtime.      . metoprolol  succinate (TOPROL XL) 25 MG 24 hr tablet Take 1 tablet (25 mg total) by mouth daily.  90 tablet  3  . Potassium 99 MG TABS Take by mouth.      . ramipril (ALTACE) 10 MG tablet Take 10 mg by mouth daily.      . Tamsulosin HCl (FLOMAX) 0.4 MG CAPS Take by mouth daily.      . vitamin E 400 UNIT capsule Take 400 Units by mouth daily.      Alveda Reasons 20 MG TABS tablet TAKE 1 TABLET EVERY DAY  30 tablet  11  . Zinc 25 MG TABS Take 1 tablet by mouth daily.      [name] zolpidem (AMBIEN) 10 MG tablet Take 10 mg by mouth at bedtime as needed.       No current facility-administered medications for this visit.   No Known Allergies   Review of Systems: All systems reviewed and negative except where noted in HPI.    Physical Exam: BP 130/60  Pulse 77  Ht 6' (1.829 m)  Wt 163 lb 6.4 oz (74.118 kg)  BMI 22.16 kg/m2  SpO2 99% Constitutional: Pleasant,well-developed, white  male in no acute distress. HEENT: Normocephalic and atraumatic. Conjunctivae are normal. No scleral icterus. Neck supple.  Cardiovascular: Normal rate, irregular rhythm.  Pulmonary/chest: Effort normal and breath sounds normal. No wheezing, rales or rhonchi. Abdominal: Soft, nondistended, nontender. Bowel sounds active throughout. There are no masses palpable. No hepatomegaly. Extremities: no edema Lymphadenopathy: No cervical adenopathy noted. Neurological: Alert and oriented to person place and time. Skin: Skin is warm and dry. No rashes noted. Psychiatric: Normal mood and affect. Behavior is normal.   ASSESSMENT AND PLAN:  48. 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  He is due for surveillance colonoscopy. Patient will need a two-day prep since bowels were inadequately prepped at last colonoscopy.  The risks, benefits, and alternatives to colonoscopy with possible biopsy and possible polypectomy were discussed with the patient and she consents to proceed.   2. Afib, on Xarelto. We  will contact cardiology about holding Xarelto for procedure.

## 2014-08-21 NOTE — Telephone Encounter (Signed)
He should take his last dose on the evening of November 21

## 2014-08-21 NOTE — Progress Notes (Signed)
Reviewed and agree.

## 2014-08-21 NOTE — Telephone Encounter (Signed)
08/21/2014   RE: Curtis Dunn DOB: 02/13/1941 MRN: [id number]   Dear [name],    We have scheduled the above patient for an Colonoscopy. Our records show that he is on anticoagulation therapy.   Please advise as to how long the patient may come off his therapy of Xarelto prior to the procedure, which is scheduled for 10-10-2014.  Please route the completed form to Evette Georges., CMA   Sincerely,    Hope Pigeon

## 2014-08-21 NOTE — Patient Instructions (Signed)

## 2014-08-22 NOTE — Telephone Encounter (Signed)
Called patient, notified patient per [name] take last dose of Xarelto on Nov 21. Patient verbalized understanding.

## 2014-08-22 NOTE — Telephone Encounter (Signed)
Will forward to [place]

## 2014-09-08 ENCOUNTER — Ambulatory Visit
Admission: RE | Admit: 2014-09-08 | Discharge: 2014-09-08 | Disposition: A | Discharge status: Home / Self Care | Payer: Medicare Other | Source: Ambulatory Visit | Attending: Chiropractic Medicine | Admitting: Chiropractic Medicine

## 2014-09-08 ENCOUNTER — Other Ambulatory Visit: Payer: Self-pay | Admitting: Chiropractic Medicine

## 2014-09-08 DIAGNOSIS — R52 Pain, unspecified: Secondary | ICD-10-CM

## 2014-10-04 ENCOUNTER — Telehealth: Payer: Self-pay | Admitting: Nurse Practitioner

## 2014-10-04 NOTE — Telephone Encounter (Signed)
Mailed instructions on blood thinner

## 2014-10-10 ENCOUNTER — Encounter: Payer: Self-pay | Admitting: Internal Medicine

## 2014-10-10 ENCOUNTER — Ambulatory Visit (AMBULATORY_SURGERY_CENTER): Payer: Medicare Other | Admitting: Internal Medicine

## 2014-10-10 VITALS — BP 121/51 | HR 58 | Temp 97.9°F | Resp 15 | Ht 72.0 in | Wt 163.0 lb

## 2014-10-10 DIAGNOSIS — Z85038 Personal history of other malignant neoplasm of large intestine: Secondary | ICD-10-CM

## 2014-10-10 DIAGNOSIS — D126 Benign neoplasm of colon, unspecified: Secondary | ICD-10-CM

## 2014-10-10 DIAGNOSIS — D122 Benign neoplasm of ascending colon: Secondary | ICD-10-CM

## 2014-10-10 LAB — GLUCOSE, CAPILLARY
Glucose-Capillary: 229 mg/dL — ABNORMAL HIGH (ref 70–99)
Glucose-Capillary: 263 mg/dL — ABNORMAL HIGH (ref 70–99)

## 2014-10-10 MED ORDER — SODIUM CHLORIDE 0.9 % IV SOLN: 500.0000 mL | INTRAVENOUS | Status: DC

## 2014-10-10 NOTE — Progress Notes (Signed)
Called to room to assist during endoscopic procedure.  Patient ID and intended procedure confirmed with present staff. Received instructions for my participation in the procedure from the performing physician.  

## 2014-10-10 NOTE — Op Note (Signed)
[facility]  [address]. [place], [address]   COLONOSCOPY PROCEDURE REPORT  PATIENT: Curtis Dunn, Curtis Dunn  MR#: [id number] BIRTHDATE: Apr 25, 1941 , 79  yrs. old GENDER: male ENDOSCOPIST: Lafayette Dragon, MD REFERRED KG:[id number] Wilson Singer, M.D. PROCEDURE DATE:  10/10/2014 PROCEDURE:   Colonoscopy with cold biopsy polypectomy First Screening Colonoscopy - Avg.  risk and is 50 yrs.  old or older - No.  Prior Negative Screening - Now for repeat screening. N/A  History of Adenoma - Now for follow-up colonoscopy & has been > or = to 3 yrs.  N/A  Polyps Removed Today? Yes. ASA CLASS:   Class II INDICATIONS:hhistory of cecal carcinoma 2001.  Status post right hemicolectomy.  Last colonoscopy February 2012 had a poor prep. Patient discontinued Xarelto prior to colonoscopy. MEDICATIONS: Monitored anesthesia care and Propofol 300 mg IV  DESCRIPTION OF PROCEDURE:   After the risks benefits and alternatives of the procedure were thoroughly explained, informed consent was obtained.  The digital rectal exam revealed no abnormalities of the rectum.   The [id number]  endoscope was introduced through the anus and advanced to the cecum, which was identified by both the appendix and ileocecal valve. No adverse events experienced.   The quality of the prep was excellent, using MoviPrep  The instrument was then slowly withdrawn as the colon was fully examined.      COLON FINDINGS: There was evidence of a prior ileocolonic surgical anastomosis.   Three sessile polyps measuring 5 mm in size were found in the ascending colon.  A polypectomy was performed with cold forceps.  The resection was complete, the polyp tissue was completely retrieved and sent to histology.  Retroflexed views revealed no abnormalities. The time to cecum=8 minutes 29 seconds. Withdrawal time=11 minutes 51 seconds.  The scope was withdrawn and the procedure completed. COMPLICATIONS: There were no immediate  complications.  ENDOSCOPIC IMPRESSION: 1.   There was evidence of a prior ileocolonic surgical anastomosis 2.   Three sessile polyps were found in the ascending colon; polypectomy was performed with cold forceps 3. history of cecal carcinoma. No evidence of recurrence  RECOMMENDATIONS:   eSigned:  Lafayette Dragon, MD 10/10/2014 2:28 PM   cc:   PATIENT NAME:  Curtis Dunn, Curtis Dunn MR#: [id number]

## 2014-10-10 NOTE — Patient Instructions (Addendum)
Impressions/recommendations:  Polyps (handout given) Diverticulosis (handout given) High Fiber diet (handout given)  Resume Xarelto tomorrow 10/11/14.  YOU HAD AN ENDOSCOPIC PROCEDURE TODAY AT [facility]: Refer to the procedure report that was given to you for any specific questions about what was found during the examination.  If the procedure report does not answer your questions, please call your gastroenterologist to clarify.  If you requested that your care partner not be given the details of your procedure findings, then the procedure report has been included in a sealed envelope for you to review at your convenience later.  YOU SHOULD EXPECT: Some feelings of bloating in the abdomen. Passage of more gas than usual.  Walking can help get rid of the air that was put into your GI tract during the procedure and reduce the bloating. If you had a lower endoscopy (such as a colonoscopy or flexible sigmoidoscopy) you may notice spotting of blood in your stool or on the toilet paper. If you underwent a bowel prep for your procedure, then you may not have a normal bowel movement for a few days.  DIET: Your first meal following the procedure should be a light meal and then it is ok to progress to your normal diet.  A half-sandwich or bowl of soup is an example of a good first meal.  Heavy or fried foods are harder to digest and may make you feel nauseous or bloated.  Likewise meals heavy in dairy and vegetables can cause extra gas to form and this can also increase the bloating.  Drink plenty of fluids but you should avoid alcoholic beverages for 24 hours.  ACTIVITY: Your care partner should take you home directly after the procedure.  You should plan to take it easy, moving slowly for the rest of the day.  You can resume normal activity the day after the procedure however you should NOT DRIVE or use heavy machinery for 24 hours (because of the sedation medicines used during the test).     SYMPTOMS TO REPORT IMMEDIATELY: A gastroenterologist can be reached at any hour.  During normal business hours, 8:30 AM to 5:00 PM Monday through Friday, call [phone].  After hours and on weekends, please call the GI answering service at [phone] who will take a message and have the physician on call contact you.   Following lower endoscopy (colonoscopy or flexible sigmoidoscopy):  Excessive amounts of blood in the stool  Significant tenderness or worsening of abdominal pains  Swelling of the abdomen that is new, acute  Fever of 100F or higher   FOLLOW UP: If any biopsies were taken you will be contacted by phone or by letter within the next 1-3 weeks.  Call your gastroenterologist if you have not heard about the biopsies in 3 weeks.  Our staff will call the home number listed on your records the next business day following your procedure to check on you and address any questions or concerns that you may have at that time regarding the information given to you following your procedure. This is a courtesy call and so if there is no answer at the home number and we have not heard from you through the emergency physician on call, we will assume that you have returned to your regular daily activities without incident.  SIGNATURES/CONFIDENTIALITY: You and/or your care partner have signed paperwork which will be entered into your electronic medical record.  These signatures attest to the fact that that the information  above on your After Visit Summary has been reviewed and is understood.  Full responsibility of the confidentiality of this discharge information lies with you and/or your care-partner. 

## 2014-10-11 ENCOUNTER — Telehealth: Payer: Self-pay | Admitting: *Deleted

## 2014-10-11 NOTE — Telephone Encounter (Signed)
  Follow up Call-  Call back number 10/10/2014  Post procedure Call Back phone  # [phone]  Permission to leave phone message Yes     Patient questions:  Do you have a fever, pain , or abdominal swelling? No. Pain Score  0 *  Have you tolerated food without any problems? Yes.    Have you been able to return to your normal activities? Yes.    Do you have any questions about your discharge instructions: Diet   No. Medications  No. Follow up visit  No.  Do you have questions or concerns about your Care? No.  Actions: * If pain score is 4 or above: No action needed, pain <4.  Patient wants to know which picture is the anastomosis site. Will try to ask Dr.Brodie today and call patient back.

## 2014-10-18 ENCOUNTER — Encounter: Payer: Self-pay | Admitting: Internal Medicine

## 2014-10-24 ENCOUNTER — Telehealth: Payer: Self-pay | Admitting: Internal Medicine

## 2014-10-25 NOTE — Telephone Encounter (Signed)
Patient notified of results as per letter on 10/18/14.

## 2014-10-31 ENCOUNTER — Telehealth: Payer: Self-pay | Admitting: *Deleted

## 2014-10-31 ENCOUNTER — Ambulatory Visit (INDEPENDENT_AMBULATORY_CARE_PROVIDER_SITE_OTHER): Payer: Medicare Other | Admitting: Cardiology

## 2014-10-31 VITALS — BP 142/66 | HR 60 | Ht 72.0 in | Wt 164.0 lb

## 2014-10-31 DIAGNOSIS — I480 Paroxysmal atrial fibrillation: Secondary | ICD-10-CM

## 2014-10-31 DIAGNOSIS — I10 Essential (primary) hypertension: Secondary | ICD-10-CM

## 2014-10-31 MED ORDER — METOPROLOL SUCCINATE ER 25 MG PO TB24: 25.0000 mg | ORAL_TABLET | Freq: Every day | ORAL | Status: DC

## 2014-10-31 NOTE — Assessment & Plan Note (Signed)
The patient has not been aware of any racing of his heart.  However frequently he himself has been unaware of his heart rhythm

## 2014-10-31 NOTE — Patient Instructions (Signed)
Your physician recommends that you continue on your current medications as directed. Please refer to the Current Medication list given to you today.  Your physician wants you to follow-up in: 6 month ov/ekg You will receive a reminder letter in the mail two months in advance. If you don't receive a letter, please call our office to schedule the follow-up appointment.  

## 2014-10-31 NOTE — Telephone Encounter (Signed)
BUN 18 Cr 1.03 K+ 4.2  Labs done 10/20/14 Patient phoned with lab results  [name] aware of results

## 2014-10-31 NOTE — Assessment & Plan Note (Signed)
Blood pressure has been remaining stable.  No chest pain or shortness of breath.

## 2014-10-31 NOTE — Progress Notes (Signed)
Curtis Dunn Date of Birth:  [birth year] [address] [place], [place]  [address] [phone]        Fax   [phone]   History of Present Illness: This pleasant 73 year old gentleman is seen for followup office visit. He has a history of paroxysmal atrial fibrillation.[name] He does not have any history of ischemic heart disease. He had a normal nuclear treadmill stress test in 2006 and at that time showed no evidence of ischemia and he had an ejection fraction of 57%. We saw him after he went in for a routine visit with his primary care provider and it was noted that he had an irregular pulse. EKG was done which confirmed that he was in atrial fibrillation with a ventricular response 91. The patient was placed on Pradaxa 150 mg twice a day for long-term anticoagulation for his atrial fibrillation. Subsequently he has been switched to Xarelto. The patient has not been expressing any symptoms of chest pain or shortness of breath. He goes to the gym on a regular basis for working out. He himself is not aware of his heartbeat unless he checks it with the machine at the gym which demonstrates that his pulse is irregular. Since going on the anticoagulation he has not been experiencing any excessive bruising or bleeding. The patient had an echocardiogram 09/09/12 showing ejection fraction 60-65% with grade 1 diastolic dysfunction and a pulmonary artery pressure of 35 and no significant valve abnormalities.  The patient had a hypoglycemic episode at home and blacked out and fell down his stairs injuring his head and suffering a neck fracture and a right wrist fracture.  He is no longer wearing his neck brace.  Current Outpatient Prescriptions  Medication Sig Dispense Refill  . amLODipine (NORVASC) 10 MG tablet Take 10 mg by mouth daily.    . Ascorbic Acid (VITAMIN C) 1000 MG tablet Take 1,000 mg by mouth daily.    . Chelated Magnesium 100 MG TABS Take by mouth  daily.    . Cyanocobalamin (VITAMIN B 12 PO) Take by mouth. Taking 1000 daily     . DHEA 50 MG TABS Take by mouth.    . Docosahexaenoic Acid (DHA OMEGA 3 PO) Take 1,200 mg by mouth daily.     . furosemide (LASIX) 20 MG tablet Take 20 mg by mouth daily.    . hydrochlorothiazide (HYDRODIURIL) 25 MG tablet Take 25 mg by mouth daily.    . insulin glargine (LANTUS) 100 UNIT/ML injection Inject 10 Units into the skin 2 (two) times daily.     . insulin lispro (HUMALOG) 100 UNIT/ML injection Inject 4-16 Units into the skin 3 (three) times daily before meals. Taking 12 units  Am; 8 UNITS  noon and 12 UNITS units at  6 pm    . lovastatin (MEVACOR) 20 MG tablet Take 20 mg by mouth at bedtime.    . metoprolol succinate (TOPROL XL) 25 MG 24 hr tablet Take 1 tablet (25 mg total) by mouth daily. 90 tablet 3  . Potassium 99 MG TABS Take by mouth.    . ramipril (ALTACE) 10 MG tablet Take 10 mg by mouth daily.    . Tamsulosin HCl (FLOMAX) 0.4 MG CAPS Take by mouth daily.    . vitamin E 400 UNIT capsule Take 400 Units by mouth daily.    Alveda Reasons 20 MG TABS tablet TAKE 1 TABLET EVERY DAY 30 tablet 11  . Zinc 25 MG TABS  Take 1 tablet by mouth daily.    [name] zolpidem (AMBIEN) 10 MG tablet Take 10 mg by mouth at bedtime as needed.     No current facility-administered medications for this visit.    No Known Allergies  Patient Active Problem List   Diagnosis Date Noted  . History of colon cancer 08/21/2014  . Acute blood loss anemia 02/11/2014  . Fracture dislocation of cervical spine 02/10/2014  . C1 cervical fracture 02/10/2014  . Fall 02/10/2014  . Distal radius fracture, right 02/10/2014  . Concussion 02/10/2014  . Facial laceration 02/10/2014  . Anticoagulated 02/10/2014  . PAF (paroxysmal atrial fibrillation) 09/08/2012  . Palpitations 03/05/2012  . MALIGNANT NEOPLASM OF CECUM 12/11/2007  . COLONIC POLYPS 12/11/2007  . DIABETES MELLITUS, TYPE I 12/11/2007  . HYPERTENSION 12/11/2007    History    Smoking status  . Never Smoker   Smokeless tobacco  . Never Used    Comment: occasional cigar    History  Alcohol Use No    Family History  Problem Relation Age of Onset  . Heart disease Father   . Colon cancer Neg Hx   . Esophageal cancer Neg Hx   . Rectal cancer Neg Hx   . Stomach cancer Neg Hx     Review of Systems: Constitutional: no fever chills diaphoresis or fatigue or change in weight.  Head and neck: no hearing loss, no epistaxis, no photophobia or visual disturbance. Respiratory: No cough, shortness of breath or wheezing. Cardiovascular: No chest pain peripheral edema, palpitations. Gastrointestinal: No abdominal distention, no abdominal pain, no change in bowel habits hematochezia or melena. Genitourinary: No dysuria, no frequency, no urgency, no nocturia. Musculoskeletal:No arthralgias, no back pain, no gait disturbance or myalgias. Neurological: No dizziness, no headaches, no numbness, no seizures, no syncope, no weakness, no tremors. Hematologic: No lymphadenopathy, no easy bruising. Psychiatric: No confusion, no hallucinations, no sleep disturbance.    Physical Exam: Filed Vitals:   10/31/14 1420  BP: 142/66  Pulse: 60   the general appearance reveals a well-developed well-nourished gentleman in no distress.The head and neck exam reveals pupils equal and reactive.  Extraocular movements are full.  There is no scleral icterus.  The mouth and pharynx are normal.  The neck is supple.  The carotids reveal no bruits.  The jugular venous pressure is normal.  The  thyroid is not enlarged.  There is no lymphadenopathy.  The chest is clear to percussion and auscultation.  There are no rales or rhonchi.  Expansion of the chest is symmetrical.  The precordium is quiet.  The first heart sound is normal.  The second heart sound is physiologically split.  There is no murmur gallop rub or click.  There is no abnormal lift or heave.  The abdomen is soft and nontender.  The  bowel sounds are normal.  The liver and spleen are not enlarged.  There are no abdominal masses.  There are no abdominal bruits.  Extremities reveal good pedal pulses.  There is no phlebitis or edema.  There is no cyanosis or clubbing.  Strength is normal and symmetrical in all extremities.  There is no lateralizing weakness.  There are no sensory deficits.  The skin is warm and dry.  There is no rash.   Assessment / Plan: 1. paroxysmal atrial fibrillation 2. cervical C1 fracture, improving 3. type I insulin diabetes 4. essential hypertension  Disposition continue same medication recheck in 6 months.  He had recent lab work at his PCP  office and his renal function remains normal.  He will continue Xarelto 20 mg daily.

## 2015-02-26 ENCOUNTER — Other Ambulatory Visit: Payer: Self-pay | Admitting: Cardiology

## 2015-05-03 ENCOUNTER — Other Ambulatory Visit: Payer: Self-pay | Admitting: Cardiology

## 2015-05-08 ENCOUNTER — Encounter: Payer: Self-pay | Admitting: Internal Medicine

## 2015-05-09 ENCOUNTER — Ambulatory Visit (INDEPENDENT_AMBULATORY_CARE_PROVIDER_SITE_OTHER): Payer: Medicare Other | Admitting: Cardiology

## 2015-05-09 ENCOUNTER — Encounter: Payer: Self-pay | Admitting: Cardiology

## 2015-05-09 VITALS — BP 130/62 | HR 67 | Ht 72.0 in | Wt 165.8 lb

## 2015-05-09 DIAGNOSIS — I10 Essential (primary) hypertension: Secondary | ICD-10-CM

## 2015-05-09 DIAGNOSIS — I480 Paroxysmal atrial fibrillation: Secondary | ICD-10-CM

## 2015-05-09 DIAGNOSIS — S12000D Unspecified displaced fracture of first cervical vertebra, subsequent encounter for fracture with routine healing: Secondary | ICD-10-CM | POA: Diagnosis not present

## 2015-05-09 MED ORDER — RIVAROXABAN 20 MG PO TABS: 20.0000 mg | ORAL_TABLET | Freq: Every day | ORAL | Status: DC

## 2015-05-09 NOTE — Progress Notes (Signed)
Cardiology Office Note   Date:  05/09/2015   ID:  Curtis Dunn, DOB 23-Feb-1941, MRN [id number]  PCP:  [name], MD  Cardiologist: Darlin Coco MD  No chief complaint on file.     History of Present Illness: Curtis Dunn is a 74 y.o. male who presents for a six-month follow-up office visit.  This pleasant 74 year old gentleman is seen for followup office visit. He has a history of paroxysmal atrial fibrillation.[name] He does not have any history of ischemic heart disease. He had a normal nuclear treadmill stress test in 2006 and at that time showed no evidence of ischemia and he had an ejection fraction of 57%. We saw him after he went in for a routine visit with his primary care provider and it was noted that he had an irregular pulse. EKG was done which confirmed that he was in atrial fibrillation with a ventricular response 91. The patient was placed on Pradaxa 150 mg twice a day for long-term anticoagulation for his atrial fibrillation. Subsequently he has been switched to Xarelto. The patient has not been expressing any symptoms of chest pain or shortness of breath. He goes to the gym on a regular basis for working out. He himself is not aware of his heartbeat unless he checks it with the machine at the gym which demonstrates that his pulse is irregular. Since going on the anticoagulation he has not been experiencing any excessive bruising or bleeding. The patient had an echocardiogram 09/09/12 showing ejection fraction 60-65% with grade 1 diastolic dysfunction and a pulmonary artery pressure of 35 and no significant valve abnormalities.  About a year ago the patient had a hypoglycemic episode at home and blacked out and fell down his stairs injuring his head and suffering a neck fracture and a right wrist fracture. He is no longer wearing his neck brace.  Since we last saw him his insulin dose total for the day has been reduced from 57 down to 47 units  daily. The patient had a recent complete physical in all of his labs were satisfactory.  His A1c was 5.9. EKG today shows that he is back in atrial fibrillation with controlled ventricular response.  Not had any TIA symptoms.  He really is not aware of his heart rate except when he gets on his exercise machine and notices that his pulse is higher than usual with exercise.  Past Medical History  Diagnosis Date  . Palpitations 07/16/2005    normal nuclear study  . Tachycardia   . Diabetes mellitus   . Hypertension   . Hyperlipidemia   . Colon cancer   . Colon polyp   . Broken neck     in March 2015    Past Surgical History  Procedure Laterality Date  . Appendectomy  2001  . Colon resection    . Elbow fracture surgery    . Arm surgery      right shoulder surgery  . Ankle surgery      right     Current Outpatient Prescriptions  Medication Sig Dispense Refill  . amLODipine (NORVASC) 10 MG tablet Take 10 mg by mouth daily.    . Ascorbic Acid (VITAMIN C) 1000 MG tablet Take 1,000 mg by mouth daily.    . Chelated Magnesium 100 MG TABS Take 1 tablet by mouth daily.     . Cyanocobalamin (VITAMIN B 12 PO) Take 1 tablet by mouth daily. Taking 1000 daily    . Docosahexaenoic Acid (  DHA OMEGA 3 PO) Take 1,200 mg by mouth daily.     . furosemide (LASIX) 40 MG tablet Take 20 mg by mouth daily.    . hydrochlorothiazide (HYDRODIURIL) 25 MG tablet Take 25 mg by mouth daily.    . insulin glargine (LANTUS) 100 UNIT/ML injection Inject 10 Units into the skin 2 (two) times daily.     . insulin lispro (HUMALOG) 100 UNIT/ML injection Inject 4-16 Units into the skin 3 (three) times daily before meals. Taking 12 units  Am; 8 UNITS  noon and 12 UNITS units at  6 pm    . lovastatin (MEVACOR) 20 MG tablet Take 20 mg by mouth at bedtime.    . metoprolol succinate (TOPROL XL) 25 MG 24 hr tablet Take 1 tablet (25 mg total) by mouth daily. 90 tablet 3  . Potassium 99 MG TABS Take 1 tablet by mouth daily.      . ramipril (ALTACE) 10 MG tablet Take 10 mg by mouth daily.    . rivaroxaban (XARELTO) 20 MG TABS tablet Take 1 tablet (20 mg total) by mouth daily. 30 tablet 11  . Tamsulosin HCl (FLOMAX) 0.4 MG CAPS Take 0.4 mg by mouth daily.     . vitamin E 400 UNIT capsule Take 400 Units by mouth daily.    . Zinc 25 MG TABS Take 1 tablet by mouth daily.    [name] zolpidem (AMBIEN) 10 MG tablet Take 10 mg by mouth at bedtime as needed (sleep).      No current facility-administered medications for this visit.    Allergies:   Review of patient's allergies indicates no known allergies.    Social History:  The patient  reports that he has never smoked. He has never used smokeless tobacco. He reports that he does not drink alcohol or use illicit drugs.   Family History:  The patient's family history includes Heart disease in his father. There is no history of Colon cancer, Esophageal cancer, Rectal cancer, or Stomach cancer.    ROS:  Please see the history of present illness.   Otherwise, review of systems are positive for none.   All other systems are reviewed and negative.    PHYSICAL EXAM: VS:  BP 130/62 mmHg  Pulse 67  Ht 6' (1.829 m)  Wt 165 lb 12.8 oz (75.206 kg)  BMI 22.48 kg/m2 , BMI Body mass index is 22.48 kg/(m^2). GEN: Well nourished, well developed, in no acute distress HEENT: normal Neck: no JVD, carotid bruits, or masses Cardiac: Irregularly irregular, no murmurs, rubs, or gallops,no edema  Respiratory:  clear to auscultation bilaterally, normal work of breathing GI: soft, nontender, nondistended, + BS MS: no deformity or atrophy Skin: warm and dry, no rash Neuro:  Strength and sensation are intact Psych: euthymic mood, full affect   EKG:  EKG is ordered today. The ekg ordered today demonstrates atrial flutter fibrillation with controlled ventricular response.  Since the previous tracing of 06/29/14, atrial flutter fibrillation has recurred   Recent Labs: No results found for  requested labs within last 365 days.    Lipid Panel    Component Value Date/Time   CHOL 148 02/14/2014 0716   TRIG 106 02/14/2014 0716   HDL 48 02/14/2014 0716   CHOLHDL 3.1 02/14/2014 0716   VLDL 21 02/14/2014 0716   LDLCALC 79 02/14/2014 0716      Wt Readings from Last 3 Encounters:  05/09/15 165 lb 12.8 oz (75.206 kg)  10/31/14 164 lb (74.39 kg)  10/10/14 163 lb (73.936 kg)         ASSESSMENT AND PLAN:  1. paroxysmal atrial fibrillation 2. cervical C1 fracture, improving.  He no longer has to wear a neck brace.  He was instructed that he should never go on a roller coaster. 3. type I insulin diabetes 4. essential hypertension  Disposition continue same medication recheck in 6 months. He had recent lab work at his PCP office and his renal function remains normal. He will continue Xarelto 20 mg daily.   Current medicines are reviewed at length with the patient today.  The patient does not have concerns regarding medicines.  The following changes have been made:  no change  Labs/ tests ordered today include:   Orders Placed This Encounter  Procedures  . EKG 12-Lead    Disposition: Continue current medication.  We refilled his Xarelto for a year.  Recheck in 6 months for office visit and EKG  Signed, Darlin Coco MD 05/09/2015 10:08 AM    [address], [place], [place]  [address] Phone: [phone]; Fax: [phone]

## 2015-05-09 NOTE — Patient Instructions (Signed)
Medication Instructions:  Your physician recommends that you continue on your current medications as directed. Please refer to the Current Medication list given to you today.  Labwork: NONE  Testing/Procedures: NONE  Follow-Up: Your physician wants you to follow-up in: 6 MONTH OV/EKG  You will receive a reminder letter in the mail two months in advance. If you don't receive a letter, please call our office to schedule the follow-up appointment.    

## 2015-07-30 ENCOUNTER — Telehealth: Payer: Self-pay | Admitting: Cardiology

## 2015-07-30 NOTE — Telephone Encounter (Signed)
Patient has had several episodes over the last week with fatigue Discussed with  [name] and will have patient seen in A Fib clinic Advised patient, agreeable to plan

## 2015-07-30 NOTE — Telephone Encounter (Signed)
New message      Talk to Rip Harbour about his PAF

## 2015-08-07 ENCOUNTER — Ambulatory Visit (HOSPITAL_COMMUNITY)
Admission: RE | Admit: 2015-08-07 | Discharge: 2015-08-07 | Disposition: A | Discharge status: Home / Self Care | Payer: Medicare Other | Source: Ambulatory Visit | Attending: Nurse Practitioner | Admitting: Nurse Practitioner

## 2015-08-07 ENCOUNTER — Encounter (HOSPITAL_COMMUNITY): Payer: Self-pay | Admitting: Nurse Practitioner

## 2015-08-07 VITALS — BP 120/70 | HR 61 | Ht 72.0 in | Wt 160.2 lb

## 2015-08-07 DIAGNOSIS — Z8249 Family history of ischemic heart disease and other diseases of the circulatory system: Secondary | ICD-10-CM | POA: Insufficient documentation

## 2015-08-07 DIAGNOSIS — I4891 Unspecified atrial fibrillation: Secondary | ICD-10-CM | POA: Diagnosis present

## 2015-08-07 DIAGNOSIS — E119 Type 2 diabetes mellitus without complications: Secondary | ICD-10-CM | POA: Diagnosis not present

## 2015-08-07 DIAGNOSIS — Z794 Long term (current) use of insulin: Secondary | ICD-10-CM | POA: Insufficient documentation

## 2015-08-07 DIAGNOSIS — I10 Essential (primary) hypertension: Secondary | ICD-10-CM | POA: Insufficient documentation

## 2015-08-07 DIAGNOSIS — I480 Paroxysmal atrial fibrillation: Secondary | ICD-10-CM

## 2015-08-07 DIAGNOSIS — Z79899 Other long term (current) drug therapy: Secondary | ICD-10-CM | POA: Insufficient documentation

## 2015-08-07 DIAGNOSIS — Z7902 Long term (current) use of antithrombotics/antiplatelets: Secondary | ICD-10-CM | POA: Diagnosis not present

## 2015-08-07 DIAGNOSIS — Z85038 Personal history of other malignant neoplasm of large intestine: Secondary | ICD-10-CM | POA: Insufficient documentation

## 2015-08-07 DIAGNOSIS — E785 Hyperlipidemia, unspecified: Secondary | ICD-10-CM | POA: Insufficient documentation

## 2015-08-07 NOTE — Patient Instructions (Signed)
May take extra 1/2 tablet of metoprolol if needed for heart rate >100 and blood pressure >100.  Afib clinic as needed

## 2015-08-07 NOTE — Progress Notes (Signed)
Patient ID: Curtis Dunn, male   DOB: 09-07-1941, 74 y.o.   MRN: [id number]     Primary Care Physician: [name], MD Referring Physician:Dr. Michaell Grider is a 74 y.o. male with a h/o Paf, in the afib clinic for evaluation of symptoms two weeks ago. He states that for several days that week he was very exhausted. Blood sugar checked normal. He is typically not that aware of palpitations but felt his wrist and his pulse was irregular. Each spell would pass in a few hours.When he saw [name] last in June he was in afib at that time but was unaware.He has not noticed any further spells since that previous week. EKG shows SR today.  He goes to the gym regularly, is not overweight. Moderate caffeine use, no snoring history. No alcohol, no tobacco use.   Today, he denies symptoms of palpitations, chest pain, shortness of breath, orthopnea, PND, lower extremity edema, dizziness, presyncope, syncope, or neurologic sequela. The patient is tolerating medications without difficulties and is otherwise without complaint today.   Past Medical History  Diagnosis Date  . Palpitations 07/16/2005    normal nuclear study  . Tachycardia   . Diabetes mellitus   . Hypertension   . Hyperlipidemia   . Colon cancer   . Colon polyp   . Broken neck     in March 2015   Past Surgical History  Procedure Laterality Date  . Appendectomy  2001  . Colon resection    . Elbow fracture surgery    . Arm surgery      right shoulder surgery  . Ankle surgery      right    Current Outpatient Prescriptions  Medication Sig Dispense Refill  . amLODipine (NORVASC) 10 MG tablet Take 10 mg by mouth daily.    . Ascorbic Acid (VITAMIN C) 1000 MG tablet Take 1,000 mg by mouth daily.    . Chelated Magnesium 100 MG TABS Take 1 tablet by mouth daily.     . Cyanocobalamin (VITAMIN B 12 PO) Take 1 tablet by mouth daily. Taking 1000 daily    . Docosahexaenoic Acid (DHA OMEGA 3 PO) Take  1,200 mg by mouth daily.     . furosemide (LASIX) 40 MG tablet Take 20 mg by mouth daily.    . hydrochlorothiazide (HYDRODIURIL) 25 MG tablet Take 25 mg by mouth daily.    . insulin glargine (LANTUS) 100 UNIT/ML injection Inject 10 Units into the skin 2 (two) times daily.     . insulin lispro (HUMALOG) 100 UNIT/ML injection Inject 4-16 Units into the skin 3 (three) times daily before meals. Taking 12 units  Am; 8 UNITS  noon and 12 UNITS units at  6 pm    . lovastatin (MEVACOR) 20 MG tablet Take 20 mg by mouth at bedtime.    . metoprolol succinate (TOPROL XL) 25 MG 24 hr tablet Take 1 tablet (25 mg total) by mouth daily. 90 tablet 3  . Potassium 99 MG TABS Take 1 tablet by mouth daily.     . ramipril (ALTACE) 10 MG tablet Take 10 mg by mouth daily.    . rivaroxaban (XARELTO) 20 MG TABS tablet Take 1 tablet (20 mg total) by mouth daily. 30 tablet 11  . Tamsulosin HCl (FLOMAX) 0.4 MG CAPS Take 0.4 mg by mouth daily.     . vitamin E 400 UNIT capsule Take 400 Units by mouth daily.    . Zinc 25 MG  TABS Take 1 tablet by mouth daily.    [name] zolpidem (AMBIEN) 10 MG tablet Take 10 mg by mouth at bedtime as needed (sleep).      No current facility-administered medications for this encounter.    No Known Allergies  Social History   Social History  . Marital Status: Widowed    Spouse Name: N/A  . Number of Children: N/A  . Years of Education: N/A   Occupational History  . Not on file.   Social History Main Topics  . Smoking status: Never Smoker   . Smokeless tobacco: Never Used     Comment: occasional cigar  . Alcohol Use: No  . Drug Use: No  . Sexual Activity: Not on file   Other Topics Concern  . Not on file   Social History Narrative    Family History  Problem Relation Age of Onset  . Heart disease Father   . Colon cancer Neg Hx   . Esophageal cancer Neg Hx   . Rectal cancer Neg Hx   . Stomach cancer Neg Hx     ROS- All systems are reviewed and negative except as per the  HPI above  Physical Exam: Filed Vitals:   08/07/15 0844  BP: 120/70  Pulse: 61  Height: 6' (1.829 m)  Weight: 160 lb 3.2 oz (72.666 kg)    GEN- The patient is well appearing, alert and oriented x 3 today.   Head- normocephalic, atraumatic Eyes-  Sclera clear, conjunctiva pink Ears- hearing intact Oropharynx- clear Neck- supple, no JVP Lymph- no cervical lymphadenopathy Lungs- Clear to ausculation bilaterally, normal work of breathing Heart- Regular rate and rhythm, no murmurs, rubs or gallops, PMI not laterally displaced GI- soft, NT, ND, + BS Extremities- no clubbing, cyanosis, or edema MS- no significant deformity or atrophy Skin- no rash or lesion Psych- euthymic mood, full affect Neuro- strength and sensation are intact  EKG- NSR, LAD pr int 164 ms, QRS int 98 ms, QTc 420 ms. Epic records reviewed   Assessment and Plan: 1. PAF Options to maintain SR discussed  with pt including multaq, flecainide, amiodarone/tikosyn. Ablation was dicussed but he would have to fail antiarrythmic first to meet guidelines to be considered. After discussion of  AAD's, benefit vrs risk profile, pt has decided for now he will continue his usual treatment. He was informed that he could take extra 1/2 of his metoprolol tablet for breakthrough afib with rvr Continue xarelto  F/u in afib clinic as needed, [name] as scheduled  Geroge Baseman. [place], [facility] [address] [place], [place] [address] [phone]

## 2015-08-13 ENCOUNTER — Telehealth: Payer: Self-pay | Admitting: Cardiology

## 2015-08-13 NOTE — Telephone Encounter (Signed)
Spoke with patient and scheduled follow up ov

## 2015-08-13 NOTE — Telephone Encounter (Signed)
New Message  Pt calling to speak w/ Rip Harbour. Pt had appt w/ AF clinic and wondered if Dr Mare Ferrari stil wanted him to come back to [facility] in December. Please call back and discuss.

## 2015-11-02 ENCOUNTER — Ambulatory Visit (INDEPENDENT_AMBULATORY_CARE_PROVIDER_SITE_OTHER): Payer: Medicare Other | Admitting: Cardiology

## 2015-11-02 ENCOUNTER — Encounter: Payer: Self-pay | Admitting: Cardiology

## 2015-11-02 VITALS — BP 130/70 | HR 89 | Ht 72.0 in | Wt 157.1 lb

## 2015-11-02 DIAGNOSIS — I10 Essential (primary) hypertension: Secondary | ICD-10-CM

## 2015-11-02 DIAGNOSIS — I480 Paroxysmal atrial fibrillation: Secondary | ICD-10-CM | POA: Diagnosis not present

## 2015-11-02 NOTE — Progress Notes (Signed)
Cardiology Office Note   Date:  11/02/2015   ID:  Curtis Dunn, DOB 1941-06-06, MRN [id number]  PCP:  Curtis Bolt, MD  Cardiologist: Curtis Coco MD  Chief Complaint  Patient presents with  . Atrial Fibrillation      History of Present Illness: Curtis Dunn is a 74 y.o. male who presents for a six-month follow-up office visit  This pleasant 74 year old gentleman is seen for followup office visit. He has a history of paroxysmal atrial fibrillation.Curtis Dunn He does not have any history of ischemic heart disease. He had a normal nuclear treadmill stress test in 2006 and at that time showed no evidence of ischemia and he had an ejection fraction of 57%. We saw him after he went in for a routine visit with his primary care provider and it was noted that he had an irregular pulse. EKG was done which confirmed that he was in atrial fibrillation with a ventricular response 91. The patient was placed on Pradaxa 150 mg twice a day for long-term anticoagulation for his atrial fibrillation. Subsequently he has been switched to Xarelto. The patient has not been expressing any symptoms of chest pain or shortness of breath. He goes to the gym on a regular basis for working out. He himself is not aware of his heartbeat unless he checks it with the machine at the gym which demonstrates that his pulse is irregular. Since going on the anticoagulation he has not been experiencing any excessive bruising or bleeding. The patient had an echocardiogram 09/09/12 showing ejection fraction 60-65% with grade 1 diastolic dysfunction and a pulmonary artery pressure of 35 and no significant valve abnormalities.  About a year ago the patient had a hypoglycemic episode at home and blacked out and fell down his stairs injuring his head and suffering a neck fracture and a right wrist fracture. He was seen in the atrial fibrillation clinic in September 2016.  At that time he was in normal sinus rhythm.   Additional options were discussed with him at that time including antiarrhythmic therapy or possibly ablation.  He elected at that time to continue current therapy. He is back in atrial fibrillation.  He really is not aware of his rhythm.  He is not having any symptoms from the atrial fibrillation at this time.  Past Medical History  Diagnosis Date  . Palpitations 07/16/2005    normal nuclear study  . Tachycardia   . Diabetes mellitus   . Hypertension   . Hyperlipidemia   . Colon cancer ([place])   . Colon polyp   . Broken neck ([place])     in March 2015    Past Surgical History  Procedure Laterality Date  . Appendectomy  2001  . Colon resection    . Elbow fracture surgery    . Arm surgery      right shoulder surgery  . Ankle surgery      right     Current Outpatient Prescriptions  Medication Sig Dispense Refill  . amLODipine (NORVASC) 10 MG tablet Take 10 mg by mouth daily.    . Ascorbic Acid (VITAMIN C) 1000 MG tablet Take 1,000 mg by mouth daily.    . Chelated Magnesium 100 MG TABS Take 1 tablet by mouth daily.     . Cyanocobalamin (VITAMIN B 12 PO) Take 1 tablet by mouth daily. Taking 1000 daily    . Docosahexaenoic Acid (DHA OMEGA 3 PO) Take 1,200 mg by mouth daily.     Curtis Dunn  furosemide (LASIX) 40 MG tablet Take 20 mg by mouth daily.    . hydrochlorothiazide (HYDRODIURIL) 25 MG tablet Take 25 mg by mouth daily.    . insulin glargine (LANTUS) 100 UNIT/ML injection Inject 10 Units into the skin 2 (two) times daily.     . insulin lispro (HUMALOG) 100 UNIT/ML injection Inject 4-16 Units into the skin 3 (three) times daily before meals. Taking 12 units  Am; 8 UNITS  noon and 12 UNITS units at  6 pm    . lovastatin (MEVACOR) 20 MG tablet Take 20 mg by mouth at bedtime.    . metoprolol succinate (TOPROL XL) 25 MG 24 hr tablet Take 1 tablet (25 mg total) by mouth daily. 90 tablet 3  . Potassium 99 MG TABS Take 1 tablet by mouth daily.     . ramipril (ALTACE) 10 MG tablet Take 10 mg by  mouth daily.    . rivaroxaban (XARELTO) 20 MG TABS tablet Take 1 tablet (20 mg total) by mouth daily. 30 tablet 11  . Tamsulosin HCl (FLOMAX) 0.4 MG CAPS Take 0.4 mg by mouth daily.     . vitamin E 400 UNIT capsule Take 400 Units by mouth daily.    . Zinc 25 MG TABS Take 1 tablet by mouth daily.    Curtis Dunn zolpidem (AMBIEN) 10 MG tablet Take 10 mg by mouth at bedtime as needed (sleep).      No current facility-administered medications for this visit.    Allergies:   Review of patient's allergies indicates no known allergies.    Social History:  The patient  reports that he has never smoked. He has never used smokeless tobacco. He reports that he does not drink alcohol or use illicit drugs.   Family History:  The patient's family history includes Heart disease in his father. There is no history of Colon cancer, Esophageal cancer, Rectal cancer, or Stomach cancer.    ROS:  Please see the history of present illness.   Otherwise, review of systems are positive for none.   All other systems are reviewed and negative.    PHYSICAL EXAM: VS:  BP 130/70 mmHg  Pulse 89  Ht 6' (1.829 m)  Wt 157 lb 1.9 oz (71.269 kg)  BMI 21.30 kg/m2 , BMI Body mass index is 21.3 kg/(m^2). GEN: Well nourished, well developed, in no acute distress HEENT: normal Neck: no JVD, carotid bruits, or masses Cardiac: Irregularly irregular rhythm at 89 bpm; no murmurs, rubs, or gallops,no edema  Respiratory:  clear to auscultation bilaterally, normal work of breathing GI: soft, nontender, nondistended, + BS MS: no deformity or atrophy Skin: warm and dry, no rash Neuro:  Strength and sensation are intact Psych: euthymic mood, full affect   EKG:  EKG is ordered today. The ekg ordered today demonstrates refill fibrillation with ventricular response 89 bpm.  Left anterior fascicular block.  Previous tracing of 08/07/15, atrial fibrillation has recurred   Recent Labs: No results found for requested labs within last 365  days.    Lipid Panel    Component Value Date/Time   CHOL 148 02/14/2014 0716   TRIG 106 02/14/2014 0716   HDL 48 02/14/2014 0716   CHOLHDL 3.1 02/14/2014 0716   VLDL 21 02/14/2014 0716   LDLCALC 79 02/14/2014 0716      Wt Readings from Last 3 Encounters:  11/02/15 157 lb 1.9 oz (71.269 kg)  08/07/15 160 lb 3.2 oz (72.666 kg)  05/09/15 165 lb 12.8 oz (75.206 kg)  ASSESSMENT AND PLAN:  1. paroxysmal atrial fibrillation 2. cervical C1 fracture, improving. He no longer has to wear a neck brace. 3. type I insulin diabetes 4. essential hypertension   Current medicines are reviewed at length with the patient today.  The patient does not have concerns regarding medicines.  The following changes have been made:  no change  Labs/ tests ordered today include:   Orders Placed This Encounter  Procedures  . EKG 12-Lead     Disposition: He again discuss possible future options for his atrial fibrillation.  Consider future referral back to the atrial fibrillation clinic.  For now we will continue current medication.  He will return in 6 months for office visit and EKG with Dr. Marlou Porch.  Berna Spare MD 11/02/2015 8:40 AM    [place], [place],   [address] Phone: [phone]; Fax: [phone]

## 2015-11-02 NOTE — Patient Instructions (Signed)
Medication Instructions:  Your physician recommends that you continue on your current medications as directed. Please refer to the Current Medication list given to you today.  Labwork: none  Testing/Procedures: none  Follow-Up: Your physician wants you to follow-up in: 6 months with Dr Skains  You will receive a reminder letter in the mail two months in advance. If you don't receive a letter, please call our office to schedule the follow-up appointment.  If you need a refill on your cardiac medications before your next appointment, please call your pharmacy.  

## 2015-12-25 ENCOUNTER — Emergency Department (HOSPITAL_COMMUNITY): Payer: Medicare Other

## 2015-12-25 ENCOUNTER — Other Ambulatory Visit: Payer: Self-pay | Admitting: Sports Medicine

## 2015-12-25 ENCOUNTER — Ambulatory Visit
Admission: RE | Admit: 2015-12-25 | Discharge: 2015-12-25 | Disposition: A | Discharge status: Home / Self Care | Payer: Medicare Other | Source: Ambulatory Visit | Attending: Sports Medicine | Admitting: Sports Medicine

## 2015-12-25 ENCOUNTER — Inpatient Hospital Stay (HOSPITAL_COMMUNITY)
Admission: EM | Admit: 2015-12-25 | Discharge: 2015-12-30 | DRG: 199 | Disposition: A | Discharge status: Home Health | Payer: Medicare Other | Attending: General Surgery | Admitting: General Surgery

## 2015-12-25 ENCOUNTER — Inpatient Hospital Stay (HOSPITAL_COMMUNITY): Payer: Medicare Other

## 2015-12-25 ENCOUNTER — Encounter (HOSPITAL_COMMUNITY): Payer: Self-pay

## 2015-12-25 DIAGNOSIS — J939 Pneumothorax, unspecified: Secondary | ICD-10-CM

## 2015-12-25 DIAGNOSIS — I10 Essential (primary) hypertension: Secondary | ICD-10-CM | POA: Diagnosis not present

## 2015-12-25 DIAGNOSIS — S299XXA Unspecified injury of thorax, initial encounter: Secondary | ICD-10-CM

## 2015-12-25 DIAGNOSIS — Z7901 Long term (current) use of anticoagulants: Secondary | ICD-10-CM

## 2015-12-25 DIAGNOSIS — S2243XA Multiple fractures of ribs, bilateral, initial encounter for closed fracture: Secondary | ICD-10-CM | POA: Diagnosis present

## 2015-12-25 DIAGNOSIS — I119 Hypertensive heart disease without heart failure: Secondary | ICD-10-CM | POA: Diagnosis present

## 2015-12-25 DIAGNOSIS — E119 Type 2 diabetes mellitus without complications: Secondary | ICD-10-CM

## 2015-12-25 DIAGNOSIS — IMO0001 Reserved for inherently not codable concepts without codable children: Secondary | ICD-10-CM

## 2015-12-25 DIAGNOSIS — E871 Hypo-osmolality and hyponatremia: Secondary | ICD-10-CM

## 2015-12-25 DIAGNOSIS — S22059A Unspecified fracture of T5-T6 vertebra, initial encounter for closed fracture: Secondary | ICD-10-CM | POA: Diagnosis present

## 2015-12-25 DIAGNOSIS — E861 Hypovolemia: Secondary | ICD-10-CM | POA: Diagnosis present

## 2015-12-25 DIAGNOSIS — Z7982 Long term (current) use of aspirin: Secondary | ICD-10-CM

## 2015-12-25 DIAGNOSIS — I480 Paroxysmal atrial fibrillation: Secondary | ICD-10-CM | POA: Diagnosis present

## 2015-12-25 DIAGNOSIS — D62 Acute posthemorrhagic anemia: Secondary | ICD-10-CM | POA: Diagnosis present

## 2015-12-25 DIAGNOSIS — E878 Other disorders of electrolyte and fluid balance, not elsewhere classified: Secondary | ICD-10-CM | POA: Diagnosis present

## 2015-12-25 DIAGNOSIS — J942 Hemothorax: Secondary | ICD-10-CM

## 2015-12-25 DIAGNOSIS — Z85038 Personal history of other malignant neoplasm of large intestine: Secondary | ICD-10-CM | POA: Diagnosis not present

## 2015-12-25 DIAGNOSIS — S225XXA Flail chest, initial encounter for closed fracture: Secondary | ICD-10-CM | POA: Diagnosis present

## 2015-12-25 DIAGNOSIS — J9811 Atelectasis: Secondary | ICD-10-CM | POA: Diagnosis present

## 2015-12-25 DIAGNOSIS — S272XXA Traumatic hemopneumothorax, initial encounter: Secondary | ICD-10-CM

## 2015-12-25 DIAGNOSIS — S271XXA Traumatic hemothorax, initial encounter: Principal | ICD-10-CM | POA: Diagnosis present

## 2015-12-25 DIAGNOSIS — N179 Acute kidney failure, unspecified: Secondary | ICD-10-CM

## 2015-12-25 DIAGNOSIS — E1165 Type 2 diabetes mellitus with hyperglycemia: Secondary | ICD-10-CM | POA: Diagnosis present

## 2015-12-25 DIAGNOSIS — W19XXXA Unspecified fall, initial encounter: Secondary | ICD-10-CM | POA: Diagnosis present

## 2015-12-25 DIAGNOSIS — R739 Hyperglycemia, unspecified: Secondary | ICD-10-CM

## 2015-12-25 DIAGNOSIS — Z794 Long term (current) use of insulin: Secondary | ICD-10-CM | POA: Diagnosis not present

## 2015-12-25 DIAGNOSIS — E785 Hyperlipidemia, unspecified: Secondary | ICD-10-CM | POA: Diagnosis present

## 2015-12-25 DIAGNOSIS — I4891 Unspecified atrial fibrillation: Secondary | ICD-10-CM | POA: Diagnosis present

## 2015-12-25 LAB — COMPREHENSIVE METABOLIC PANEL
ALT: 26 U/L (ref 17–63)
AST: 48 U/L — ABNORMAL HIGH (ref 15–41)
Albumin: 3.2 g/dL — ABNORMAL LOW (ref 3.5–5.0)
Alkaline Phosphatase: 60 U/L (ref 38–126)
Anion gap: 19 — ABNORMAL HIGH (ref 5–15)
BUN: 81 mg/dL — ABNORMAL HIGH (ref 6–20)
CO2: 25 mmol/L (ref 22–32)
Calcium: 9.2 mg/dL (ref 8.9–10.3)
Chloride: 83 mmol/L — ABNORMAL LOW (ref 101–111)
Creatinine, Ser: 2.8 mg/dL — ABNORMAL HIGH (ref 0.61–1.24)
GFR calc Af Amer: 24 mL/min — ABNORMAL LOW (ref >60)
GFR calc non Af Amer: 21 mL/min — ABNORMAL LOW (ref >60)
Glucose, Bld: 674 mg/dL (ref 65–99)
Potassium: 4.8 mmol/L (ref 3.5–5.1)
Sodium: 127 mmol/L — ABNORMAL LOW (ref 135–145)
Total Bilirubin: 2 mg/dL — ABNORMAL HIGH (ref 0.3–1.2)
Total Protein: 5.7 g/dL — ABNORMAL LOW (ref 6.5–8.1)

## 2015-12-25 LAB — URINALYSIS, ROUTINE W REFLEX MICROSCOPIC
Bilirubin Urine: NEGATIVE
Glucose, UA: >1000 mg/dL — AB
Hgb urine dipstick: NEGATIVE
Ketones, ur: NEGATIVE mg/dL
Leukocytes, UA: NEGATIVE
Nitrite: NEGATIVE
Protein, ur: NEGATIVE mg/dL
Specific Gravity, Urine: 1.025 (ref 1.005–1.030)
pH: 5 (ref 5.0–8.0)

## 2015-12-25 LAB — SAMPLE TO BLOOD BANK

## 2015-12-25 LAB — CBC
HCT: 29.7 % — ABNORMAL LOW (ref 39.0–52.0)
Hemoglobin: 10 g/dL — ABNORMAL LOW (ref 13.0–17.0)
MCH: 31.4 pg (ref 26.0–34.0)
MCHC: 33.7 g/dL (ref 30.0–36.0)
MCV: 93.4 fL (ref 78.0–100.0)
Platelets: 159 10*3/uL (ref 150–400)
RBC: 3.18 MIL/uL — ABNORMAL LOW (ref 4.22–5.81)
RDW: 12.9 % (ref 11.5–15.5)
WBC: 13.1 10*3/uL — ABNORMAL HIGH (ref 4.0–10.5)

## 2015-12-25 LAB — URINE MICROSCOPIC-ADD ON
Bacteria, UA: NONE SEEN
RBC / HPF: NONE SEEN RBC/hpf (ref 0–5)
Squamous Epithelial / LPF: NONE SEEN
WBC, UA: NONE SEEN WBC/hpf (ref 0–5)

## 2015-12-25 LAB — CBG MONITORING, ED
Glucose-Capillary: 546 mg/dL — ABNORMAL HIGH (ref 65–99)
Glucose-Capillary: 490 mg/dL — ABNORMAL HIGH (ref 65–99)
Glucose-Capillary: 484 mg/dL — ABNORMAL HIGH (ref 65–99)
Glucose-Capillary: 505 mg/dL — ABNORMAL HIGH (ref 65–99)

## 2015-12-25 LAB — CDS SEROLOGY

## 2015-12-25 LAB — ETHANOL: Alcohol, Ethyl (B): <5 mg/dL (ref <5)

## 2015-12-25 LAB — PROTIME-INR
INR: 1.76 — ABNORMAL HIGH (ref 0.00–1.49)
Prothrombin Time: 20.5 seconds — ABNORMAL HIGH (ref 11.6–15.2)

## 2015-12-25 MED ORDER — DEXTROSE 50 % IV SOLN: 25.0000 mL | INTRAVENOUS | Status: DC | PRN

## 2015-12-25 MED ORDER — MIDAZOLAM HCL 2 MG/2ML IJ SOLN
INTRAMUSCULAR | Status: DC | PRN
  Administered 2015-12-25: 2 mg via INTRAVENOUS

## 2015-12-25 MED ORDER — LIDOCAINE-EPINEPHRINE 1 %-1:100000 IJ SOLN
20.0000 mL | Freq: Once | INTRAMUSCULAR | Status: AC
  Administered 2015-12-25: 20 mL
  Filled 2015-12-25: qty 1

## 2015-12-25 MED ORDER — BISACODYL 10 MG RE SUPP: 10.0000 mg | Freq: Every day | RECTAL | Status: DC | PRN

## 2015-12-25 MED ORDER — ONDANSETRON HCL 4 MG/2ML IJ SOLN
4.0000 mg | Freq: Once | INTRAMUSCULAR | Status: AC
  Administered 2015-12-25: 4 mg via INTRAVENOUS

## 2015-12-25 MED ORDER — HYDROCHLOROTHIAZIDE 25 MG PO TABS: 25.0000 mg | ORAL_TABLET | Freq: Every day | ORAL | Status: DC

## 2015-12-25 MED ORDER — METOPROLOL SUCCINATE ER 25 MG PO TB24: 25.0000 mg | ORAL_TABLET | Freq: Every day | ORAL | Status: DC

## 2015-12-25 MED ORDER — POTASSIUM 99 MG PO TABS: 1.0000 | ORAL_TABLET | Freq: Every day | ORAL | Status: DC

## 2015-12-25 MED ORDER — SODIUM CHLORIDE 0.9 % IV BOLUS (SEPSIS)
500.0000 mL | Freq: Once | INTRAVENOUS | Status: AC
  Administered 2015-12-26: 500 mL via INTRAVENOUS

## 2015-12-25 MED ORDER — SODIUM CHLORIDE 0.9 % IV SOLN
INTRAVENOUS | Status: DC
  Administered 2015-12-25: 4.5 [IU]/h via INTRAVENOUS
  Filled 2015-12-25: qty 2.5

## 2015-12-25 MED ORDER — SODIUM CHLORIDE 0.9 % IV SOLN: 250.0000 mL | INTRAVENOUS | Status: DC | PRN

## 2015-12-25 MED ORDER — SODIUM CHLORIDE 0.9% FLUSH: 3.0000 mL | Freq: Two times a day (BID) | INTRAVENOUS | Status: DC

## 2015-12-25 MED ORDER — AMLODIPINE BESYLATE 10 MG PO TABS
10.0000 mg | ORAL_TABLET | Freq: Every day | ORAL | Status: DC
  Administered 2015-12-26 – 2015-12-30 (×5): 10 mg via ORAL
  Filled 2015-12-25 (×5): qty 1

## 2015-12-25 MED ORDER — DOCUSATE SODIUM 100 MG PO CAPS
100.0000 mg | ORAL_CAPSULE | Freq: Two times a day (BID) | ORAL | Status: DC
  Administered 2015-12-26 (×3): 100 mg via ORAL
  Filled 2015-12-25 (×3): qty 1

## 2015-12-25 MED ORDER — CHELATED MAGNESIUM 100 MG PO TABS: 1.0000 | ORAL_TABLET | Freq: Every day | ORAL | Status: DC

## 2015-12-25 MED ORDER — HYDROMORPHONE HCL 1 MG/ML IJ SOLN
0.5000 mg | INTRAMUSCULAR | Status: DC | PRN
Start: 2015-12-25 — End: 2015-12-28
  Administered 2015-12-26 – 2015-12-28 (×8): 1 mg via INTRAVENOUS
  Filled 2015-12-25 (×8): qty 1

## 2015-12-25 MED ORDER — MIDAZOLAM HCL 2 MG/2ML IJ SOLN
2.0000 mg | Freq: Once | INTRAMUSCULAR | Status: AC
  Administered 2015-12-25: 2 mg via INTRAVENOUS

## 2015-12-25 MED ORDER — INSULIN ASPART 100 UNIT/ML ~~LOC~~ SOLN
5.0000 [IU] | Freq: Once | SUBCUTANEOUS | Status: AC
  Administered 2015-12-25: 5 [IU] via INTRAVENOUS
  Filled 2015-12-25: qty 1

## 2015-12-25 MED ORDER — ONDANSETRON HCL 4 MG/2ML IJ SOLN
INTRAMUSCULAR | Status: AC
  Filled 2015-12-25: qty 2

## 2015-12-25 MED ORDER — RIVAROXABAN 20 MG PO TABS: 20.0000 mg | ORAL_TABLET | Freq: Every day | ORAL | Status: DC

## 2015-12-25 MED ORDER — SODIUM CHLORIDE 0.9 % IV SOLN
INTRAVENOUS | Status: DC | PRN
  Administered 2015-12-25: 1000 mL via INTRAVENOUS

## 2015-12-25 MED ORDER — ZOLPIDEM TARTRATE 5 MG PO TABS: 10.0000 mg | ORAL_TABLET | Freq: Every evening | ORAL | Status: DC | PRN

## 2015-12-25 MED ORDER — RAMIPRIL 10 MG PO CAPS: 10.0000 mg | ORAL_CAPSULE | Freq: Every day | ORAL | Status: DC

## 2015-12-25 MED ORDER — ACETAMINOPHEN 325 MG PO TABS: 650.0000 mg | ORAL_TABLET | ORAL | Status: DC | PRN

## 2015-12-25 MED ORDER — INSULIN GLARGINE 100 UNIT/ML ~~LOC~~ SOLN
10.0000 [IU] | Freq: Two times a day (BID) | SUBCUTANEOUS | Status: DC
  Administered 2015-12-25: 10 [IU] via SUBCUTANEOUS
  Filled 2015-12-25: qty 0.1

## 2015-12-25 MED ORDER — PRAVASTATIN SODIUM 10 MG PO TABS
20.0000 mg | ORAL_TABLET | Freq: Every day | ORAL | Status: DC
  Administered 2015-12-26 – 2015-12-30 (×5): 20 mg via ORAL
  Filled 2015-12-25: qty 1
  Filled 2015-12-25 (×3): qty 2
  Filled 2015-12-25: qty 1

## 2015-12-25 MED ORDER — ONDANSETRON HCL 4 MG PO TABS: 4.0000 mg | ORAL_TABLET | Freq: Four times a day (QID) | ORAL | Status: DC | PRN

## 2015-12-25 MED ORDER — INSULIN REGULAR BOLUS VIA INFUSION
0.0000 [IU] | Freq: Three times a day (TID) | INTRAVENOUS | Status: DC
  Filled 2015-12-25: qty 10

## 2015-12-25 MED ORDER — FUROSEMIDE 20 MG PO TABS: 20.0000 mg | ORAL_TABLET | Freq: Every day | ORAL | Status: DC

## 2015-12-25 MED ORDER — MIDAZOLAM HCL 2 MG/2ML IJ SOLN
INTRAMUSCULAR | Status: AC
  Filled 2015-12-25: qty 4

## 2015-12-25 MED ORDER — TAMSULOSIN HCL 0.4 MG PO CAPS: 0.4000 mg | ORAL_CAPSULE | Freq: Every day | ORAL | Status: DC

## 2015-12-25 MED ORDER — MAGNESIUM OXIDE 400 (241.3 MG) MG PO TABS
400.0000 mg | ORAL_TABLET | Freq: Every day | ORAL | Status: DC
  Administered 2015-12-26 – 2015-12-30 (×5): 400 mg via ORAL
  Filled 2015-12-25 (×6): qty 1

## 2015-12-25 MED ORDER — AMLODIPINE BESYLATE 5 MG PO TABS: 10.0000 mg | ORAL_TABLET | Freq: Every day | ORAL | Status: DC

## 2015-12-25 MED ORDER — METOPROLOL SUCCINATE ER 25 MG PO TB24
25.0000 mg | ORAL_TABLET | Freq: Every day | ORAL | Status: DC
  Administered 2015-12-26 – 2015-12-30 (×5): 25 mg via ORAL
  Filled 2015-12-25 (×5): qty 1

## 2015-12-25 MED ORDER — SODIUM CHLORIDE 0.9% FLUSH: 3.0000 mL | INTRAVENOUS | Status: DC | PRN

## 2015-12-25 MED ORDER — TAMSULOSIN HCL 0.4 MG PO CAPS
0.4000 mg | ORAL_CAPSULE | Freq: Every day | ORAL | Status: DC
  Administered 2015-12-26 – 2015-12-30 (×5): 0.4 mg via ORAL
  Filled 2015-12-25 (×5): qty 1

## 2015-12-25 MED ORDER — OXYCODONE HCL 5 MG PO TABS
5.0000 mg | ORAL_TABLET | ORAL | Status: DC | PRN
  Administered 2015-12-25 – 2015-12-27 (×3): 10 mg via ORAL
  Filled 2015-12-25 (×3): qty 2

## 2015-12-25 MED ORDER — INSULIN ASPART 100 UNIT/ML ~~LOC~~ SOLN: 10.0000 [IU] | Freq: Three times a day (TID) | SUBCUTANEOUS | Status: DC

## 2015-12-25 MED ORDER — DEXTROSE 50 % IV SOLN: 1.0000 | Freq: Once | INTRAVENOUS | Status: DC | PRN

## 2015-12-25 MED ORDER — DEXTROSE-NACL 5-0.45 % IV SOLN
INTRAVENOUS | Status: DC
  Administered 2015-12-26: 04:00:00 via INTRAVENOUS

## 2015-12-25 MED ORDER — SODIUM CHLORIDE 0.9 % IV SOLN
INTRAVENOUS | Status: DC
  Administered 2015-12-25: via INTRAVENOUS

## 2015-12-25 MED ORDER — ONDANSETRON HCL 4 MG/2ML IJ SOLN: 4.0000 mg | Freq: Four times a day (QID) | INTRAMUSCULAR | Status: DC | PRN

## 2015-12-25 NOTE — ED Notes (Signed)
Call Truen Schaul (son)  [phone] when patient gets a room

## 2015-12-25 NOTE — Consult Note (Signed)
[facility] Medical Consultation  Curtis Dunn [id number] DOB: April 29, 1941 DOA: 12/25/2015 PCP: [name], MD   Requesting physician: Dr. Barry Dienes Date of consultation: 12/25/15 Reason for consultation: Management of DM, and AKI  Impression/Recommendations Principal Problem:   Flail chest Active Problems:   Essential hypertension   PAF (paroxysmal atrial fibrillation) ([place])   Anticoagulated   AKI (acute kidney injury) ([place])   Hemopneumothorax on left   IDDM (insulin dependent diabetes mellitus) ([place])    1. Flail chest with hemopneumothorax on left - chest tube in place, trauma is primary. 2. IDDM with hyperglycemia - 1. Insulin gtt to stabilize his BGL 2. Patient is having higher than baseline BGL requirements due to acute illness and in spite of his AKI 3. Does have anion gap of 19, but no keytones in urine, bicarb is WNL at 25 3. HTN - holding lasix, HCTZ, and ARB due to AKI, will leave him on toprol xl (which i suspect is more for A.Fib rate control), and norvasc for now 4. AKI - pre-renal 1. Hold diuretics and ARB 2. Got 1L bolus in ED, ordering another 500cc bolus now 3. Getting 125 cc/hr with insulin gtt initially then 75 cc/hr D5 half after that 4. Repeat BMP ordered for AM 5. Suspect a large portion of hypovolemia is due to uncontrolled DM, more so than the blood loss due to hemopneumothorax. 5. PAF - Currently going at rate of 110s 1. Will see if this improves with IVF, continue toprol-xl 2. If not add cardizem gtt if needed for rate control 3. Holding [place] due to recent falls and hemopneumothorax 6. Pseudohyponatremia - due to elevated BGL, corrected sodium is actually 136.2 7. Hypochloremia - getting NS as noted above for hypovolemia  I will followup again tomorrow. Please contact me if I can be of assistance in the meanwhile. Thank you for this consultation.  Chief Complaint: SOB  HPI:  75 yo M who fell 2 days ago, patient has been  having chest pain, worsening SOB.  Pain is worse with deep breaths.  He also reports having trouble with controlling his BGL despite giving lantus BID and insulin with meals.  He has had a couple of falls this week, hit his head once, and had another fall 2 days ago that resulted in his chest pain.  He has a h/o PAF and is on chronic Xarelto.  Work up in the ED demonstrates flail chest with R sided rib fractures of ribs 3-11, left sided hemopneumothorax.  Review of Systems:  12 systems reviewed and otherwise negative  Past Medical History  Diagnosis Date  . Palpitations 07/16/2005    normal nuclear study  . Tachycardia   . Diabetes mellitus   . Hypertension   . Hyperlipidemia   . Colon cancer ([place])   . Colon polyp   . Broken neck ([place])     in March 2015   Past Surgical History  Procedure Laterality Date  . Appendectomy  2001  . Colon resection    . Elbow fracture surgery    . Arm surgery      right shoulder surgery  . Ankle surgery      right   Social History:  reports that he has never smoked. He has never used smokeless tobacco. He reports that he does not drink alcohol or use illicit drugs.  No Known Allergies Family History  Problem Relation Age of Onset  . Heart disease Father   . Colon cancer Neg Hx   .  Esophageal cancer Neg Hx   . Rectal cancer Neg Hx   . Stomach cancer Neg Hx     Prior to Admission medications   Medication Sig Start Date End Date Taking? Authorizing Provider  amLODipine (NORVASC) 10 MG tablet Take 10 mg by mouth daily.   Yes Historical Provider, MD  Ascorbic Acid (VITAMIN C) 1000 MG tablet Take 1,000 mg by mouth daily.   Yes Historical Provider, MD  Chelated Magnesium 100 MG TABS Take 1 tablet by mouth daily.    Yes Historical Provider, MD  Cyanocobalamin (VITAMIN B 12 PO) Take 1 tablet by mouth daily. Taking 1000 daily   Yes Historical Provider, MD  Docosahexaenoic Acid (DHA OMEGA 3 PO) Take 1,200 mg by mouth daily.    Yes Historical  Provider, MD  furosemide (LASIX) 40 MG tablet Take 20-40 mg by mouth daily. Alternate 20 & 40 mg dose   Yes Historical Provider, MD  hydrochlorothiazide (HYDRODIURIL) 25 MG tablet Take 25 mg by mouth daily.   Yes Historical Provider, MD  insulin glargine (LANTUS) 100 UNIT/ML injection Inject 15 Units into the skin 2 (two) times daily.    Yes Historical Provider, MD  insulin lispro (HUMALOG) 100 UNIT/ML injection Inject 4-16 Units into the skin 3 (three) times daily before meals. Taking 12 units  Am; 10 UNITS  noon and 12 UNITS units at  6 pm   Yes Historical Provider, MD  lovastatin (MEVACOR) 20 MG tablet Take 20 mg by mouth at bedtime.   Yes Historical Provider, MD  metoprolol succinate (TOPROL XL) 25 MG 24 hr tablet Take 1 tablet (25 mg total) by mouth daily. 10/31/14  Yes Darlin Coco, MD  Potassium 99 MG TABS Take 1 tablet by mouth daily.    Yes Historical Provider, MD  ramipril (ALTACE) 10 MG tablet Take 10 mg by mouth daily.   Yes Historical Provider, MD  rivaroxaban (XARELTO) 20 MG TABS tablet Take 1 tablet (20 mg total) by mouth daily. 05/09/15  Yes Darlin Coco, MD  Tamsulosin HCl (FLOMAX) 0.4 MG CAPS Take 0.4 mg by mouth daily.    Yes Historical Provider, MD  vitamin E 400 UNIT capsule Take 400 Units by mouth daily.   Yes Historical Provider, MD  Zinc 25 MG TABS Take 1 tablet by mouth daily.   Yes Historical Provider, MD  zolpidem (AMBIEN) 10 MG tablet Take 10 mg by mouth at bedtime as needed (sleep).    Yes Historical Provider, MD   Physical Exam: Blood pressure 129/71, pulse 113, temperature 98.1 F (36.7 C), temperature source Oral, resp. rate 29, SpO2 95 %. Filed Vitals:   12/25/15 2130 12/25/15 2200  BP: 129/70 129/71  Pulse: 116 113  Temp:    Resp: 23 29    General:  NAD, resting comfortably in bed Eyes: PEERLA EOMI ENT: mucous membranes moist Neck: supple w/o JVD Cardiovascular: RRR w/o MRG Respiratory: CTA B, Chest tube in left side draining sanguinous  fluid Abdomen: soft, nt, nd, bs+ Skin: R frontal abrasion on head, numerous small bruises all over body. Musculoskeletal: MAE, full ROM all 4 extremities Psychiatric: normal tone and affect Neurologic: AAOx3, grossly non-focal   Labs on Admission:  Basic Metabolic Panel:  Recent Labs Lab 12/25/15 1726  NA 127*  K 4.8  CL 83*  CO2 25  GLUCOSE 674*  BUN 81*  CREATININE 2.80*  CALCIUM 9.2   Liver Function Tests:  Recent Labs Lab 12/25/15 1726  AST 48*  ALT 26  ALKPHOS 60  BILITOT 2.0*  PROT 5.7*  ALBUMIN 3.2*   No results for input(s): LIPASE, AMYLASE in the last 168 hours. No results for input(s): AMMONIA in the last 168 hours. CBC:  Recent Labs Lab 12/25/15 1726  WBC 13.1*  HGB 10.0*  HCT 29.7*  MCV 93.4  PLT 159   Cardiac Enzymes: No results for input(s): CKTOTAL, CKMB, CKMBINDEX, TROPONINI in the last 168 hours. BNP: Invalid input(s): POCBNP CBG:  Recent Labs Lab 12/25/15 1853 12/25/15 2041 12/25/15 2234  GLUCAP 546* 56* 484*    Radiological Exams on Admission: Ct Head Without Contrast  12/25/2015  CLINICAL DATA:  75 year old male with fall EXAM: CT HEAD WITHOUT CONTRAST TECHNIQUE: Contiguous axial images were obtained from the base of the skull through the vertex without intravenous contrast. COMPARISON:  CT dated 02/10/2014 FINDINGS: The ventricles and sulci are appropriate in size for patient's age. Mild periventricular and deep white matter hypodensities represent chronic microvascular ischemic changes. There is no intracranial hemorrhage. No mass effect or midline shift identified. There is mild mucoperiosteal thickening of paranasal sinuses. No air-fluid levels. Mastoid air cells are clear. The calvarium is intact. IMPRESSION: No acute intracranial hemorrhage. Mild age-related atrophy and chronic microvascular ischemic disease. Electronically Signed   By: Anner Crete M.D.   On: 12/25/2015 21:26   Ct Chest Wo Contrast  12/25/2015  ADDENDUM  REPORT: 12/25/2015 14:43 ADDENDUM: There is cholelithiasis. Electronically Signed   By: Lowella Grip III M.D.   On: 12/25/2015 14:43  12/25/2015  CLINICAL DATA:  Pain with shortness of breath following fall EXAM: CT CHEST WITHOUT CONTRAST TECHNIQUE: Multidetector CT imaging of the chest was performed following the standard protocol without IV contrast. COMPARISON:  None. FINDINGS: Mediastinum/Lymph Nodes: Thyroid appears normal. No demonstrable adenopathy. No thoracic aortic aneurysm. Visualized great vessels appear unremarkable on this noncontrast enhanced study. Pericardium is not appreciably thickened. There are multiple foci of coronary artery calcification. Lungs/Pleura: There is a large left-sided pneumothorax. There is a sizable left pleural effusion with left base consolidation. There is mild atelectasis in the right base. Right lung is otherwise clear. Upper abdomen: There is a small gallstone in gallbladder. Gallbladder wall does not appear thickened. There is atherosclerotic calcification in the upper aorta. The visualized upper abdominal structures otherwise appear unremarkable. Musculoskeletal: There is extensive hematoma throughout the left postero lateral chest wall with hemorrhage in several peripheral muscles on the left postero laterally. There are comminuted fractures of the left posterior and posterior lateral third, fourth, fifth, and sixth ribs. There are comminuted lateral fractures of the left fourth, fifth, sixth, seventh, eighth, and ninth ribs. There is a comminuted fracture of the posterior left eleventh rib. There is anterior wedging of the T5 vertebral body without retropulsion of bone. There is degenerative change in the thoracic spine and both shoulders. IMPRESSION: Large left hydropneumothorax with moderate left effusion and left base consolidation. Multiple comminuted fractures of left ribs as noted above. Mild anterior wedging of the T5 vertebral body without retropulsion of  bone or canal stenosis. Extensive hematoma throughout the left posterolateral chest wall and musculature. Note that there is degenerative change in the shoulders and thoracic spine. Critical Value/emergent results were called by telephone at the time of interpretation on 12/25/2015 at 1:03 pm to Dr. Vickki Hearing , who verbally acknowledged these results. Electronically Signed: By: Lowella Grip III M.D. On: 12/25/2015 13:03   Dg Pelvis Portable  12/25/2015  CLINICAL DATA:  Fall.  This occurred 12/23/2015. EXAM: PORTABLE PELVIS 1-2 VIEWS COMPARISON:  None. FINDINGS: There is no evidence of pelvic fracture or diastasis. No pelvic bone lesions are seen. IMPRESSION: Negative. If concern for occult hip fracture, dedicated hip radiographs are recommended as the femoral necks are not optimally visualized on this portable supine radiograph. Electronically Signed   By: Staci Righter M.D.   On: 12/25/2015 18:37   Ct T-spine No Charge  12/25/2015  CLINICAL DATA:  75 year old male with thoracic spine and chest wall pain. Question vertebral and/or left rib fracture. Fall 2 days ago. Initial encounter. EXAM: CT THORACIC SPINE WITHOUT CONTRAST TECHNIQUE: Multidetector CT imaging of the thoracic spine was performed without intravenous contrast administration. Multiplanar CT image reconstructions were also generated. COMPARISON:  Chest CT from today reported separately. Thoracic spine radiographs 09/08/2014. FINDINGS: There are acute fractures of the left ribs 3 through 12. Many of these are comminuted and/or displaced. Several are fractured in 2 places (at least ribs ribs 7 through 9) are fractured in 2 places. The entire left lateral ribs are included on the Chest reported separately. Left pneumothorax and probable large hemothorax, see chest findings reported separately. Cervicothoracic junction alignment is within normal limits. Lower cervical advanced disc and endplate degeneration including vacuum disc. Exaggerated upper  thoracic kyphosis with mild T5 wedging appears stable since 2015. No thoracic vertebral fracture identified. Intermittent thoracic posterior element degeneration. There is a nondisplaced left L1 transverse process fracture. No thoracic vertebral fracture identified. The visualized posterior right ribs are intact. Negative visualized upper abdominal viscera. Calcified aortic atherosclerosis. IMPRESSION: 1. Acute fractures of all left ribs except 1 and 2, with most fractures comminuted and/or displaced, and at least 3 flail segments (7-9). 2. Large left hemopneumothorax, see chest CT reported separately, and critical findings on this study have been called to the ordering provider by Dr. Jasmine December. 3. No thoracic spine fracture identified. There is a nondisplaced left L1 transverse process fracture (stable injury). Electronically Signed   By: Genevie Ann M.D.   On: 12/25/2015 13:17   Dg Chest Portable 1 View  12/25/2015  CLINICAL DATA:  75 year old male with multiple left rib fractures and status post left-sided chest tube placement. Patient having difficulty and painful breathing. EXAM: PORTABLE CHEST 1 VIEW COMPARISON:  Chest radiograph dated 02/10/2014 and chest CT dated 12/25/2015 FINDINGS: The left-sided chest tube is noted with tip projecting over the left hilar region. There is opacification of the left lung base with silhouetting of the left hemidiaphragm compatible with combination consolidative changes of the left lower lobe and left pleural effusion. There is silhouetting of the left cardiac border. The right lung is clear. Minimal pneumothorax may be present at the left apical region measuring approximately 1 cm from the apex. There multiple left rib fractures as seen on the chest CT. Left chest wall soft tissue emphysema noted. There is severe osteoarthritic changes of the right shoulder. IMPRESSION: Left lung base opacity compatible with combination of consolidative changes and effusion. Left chest tube with  tip projecting over the left hilum. Minimal left apical pneumothorax may be present. Electronically Signed   By: Anner Crete M.D.   On: 12/25/2015 18:44   Dg Chest Portable 1 View  12/25/2015  CLINICAL DATA:  Fall 2 days ago.  Difficulty breathing since fall. EXAM: PORTABLE CHEST 1 VIEW COMPARISON:  Current chest CT. Previous chest radiograph, 02/10/2014. FINDINGS: As noted on the current chest CT, there multiple left-sided rib fractures, several mildly displaced. There is a hydro pneumothorax. Left lung shows approximately 30-40% collapse. Right lung is clear.  Healed fracture of the right lateral seventh rib. No right pleural effusion or pneumothorax. Cardiac silhouette is normal in size. No mediastinal or hilar masses. There are advanced chronic arthropathic changes of the right shoulder. IMPRESSION: 1. As noted on the current chest CT, there is a significant left hydro pneumothorax with multiple associated left-sided rib fractures. 2. No other acute finding. Electronically Signed   By: Lajean Manes M.D.   On: 12/25/2015 16:48    EKG: Independently reviewed.  Time spent: 70 min  Angeliz Settlemyre M. [facility] Pager [phone]  If 7PM-7AM, please contact night-coverage [email or web address] Password [facility] 12/25/2015, 10:51 PM

## 2015-12-25 NOTE — H&P (Signed)
History   Curtis Dunn is an 75 y.o. male.   Chief Complaint:  Chief Complaint  Patient presents with  . Shortness of Breath    Shortness of Breath Associated symptoms: chest pain   Associated symptoms: no abdominal pain, no headaches, no neck pain and no vomiting   Fall This is a recurrent problem. The current episode started today. The problem occurs every several days. The problem has been unchanged. Associated symptoms include chest pain and joint swelling. Pertinent negatives include no abdominal pain, anorexia, arthralgias, change in bowel habit, chills, headaches, neck pain, numbness, vertigo, visual change, vomiting or weakness. Associated symptoms comments: Swings in blood sugar. Exacerbated by: deep breaths. He has tried rest for the symptoms. The treatment provided no relief.   Patient is a 75 year old male who fell 2 days ago. He states that he's been having trouble with his blood sugar. Been high despite giving insulin with meals and Lantus twice a day. He does not recall the fall. He does state that he developed chest pain. This continued to worsen and he also got very short of breath. He came to the emergency department today from home by private vehicle. He has fallen other times this week. One of those times he did hit his head. He denies nausea or vomiting. He denies back pain. He has some mild left ankle soreness. He complains that his A1C has been way "out of whack."   Past Medical History  Diagnosis Date  . Palpitations 07/16/2005    normal nuclear study  . Tachycardia   . Diabetes mellitus   . Hypertension   . Hyperlipidemia   . Colon cancer ([place])   . Colon polyp   . Broken neck ([place])     in March 2015    Past Surgical History  Procedure Laterality Date  . Appendectomy  2001  . Colon resection    . Elbow fracture surgery    . Arm surgery      right shoulder surgery  . Ankle surgery      right    Family History  Problem Relation Age of Onset  .  Heart disease Father   . Colon cancer Neg Hx   . Esophageal cancer Neg Hx   . Rectal cancer Neg Hx   . Stomach cancer Neg Hx    Social History:  reports that he has never smoked. He has never used smokeless tobacco. He reports that he does not drink alcohol or use illicit drugs.  Allergies  No Known Allergies  Home Medications   amLODipine (NORVASC) 10 MG tablet    Ascorbic Acid (VITAMIN C) 1000 MG tablet   Chelated Magnesium 100 MG TABS   Cyanocobalamin (VITAMIN B 12 PO)   Docosahexaenoic Acid (DHA OMEGA 3 PO)   furosemide (LASIX) 40 MG tablet   hydrochlorothiazide (HYDRODIURIL) 25 MG tablet   insulin glargine (LANTUS) 100 UNIT/ML injection   insulin lispro (HUMALOG) 100 UNIT/ML injection   lovastatin (MEVACOR) 20 MG tablet   metoprolol succinate (TOPROL XL) 25 MG 24 hr tablet   Potassium 99 MG TABS   ramipril (ALTACE) 10 MG tablet   rivaroxaban (XARELTO) 20 MG TABS tablet   Tamsulosin HCl (FLOMAX) 0.4 MG CAPS   vitamin E 400 UNIT capsule   Zinc 25 MG TABS   zolpidem (AMBIEN) 10 MG tablet         Trauma Course   Results for orders placed or performed during the hospital encounter of 12/25/15 (from the  past 48 hour(s))  Ethanol     Status: None   Collection Time: 12/25/15  4:27 PM  Result Value Ref Range   Alcohol, Ethyl (B) <5 <5 mg/dL    Comment:        LOWEST DETECTABLE LIMIT FOR SERUM ALCOHOL IS 5 mg/dL FOR MEDICAL PURPOSES ONLY   Sample to Blood Bank     Status: None   Collection Time: 12/25/15  4:48 PM  Result Value Ref Range   Blood Bank Specimen SAMPLE AVAILABLE FOR TESTING    Sample Expiration 12/26/2015   CDS serology     Status: None   Collection Time: 12/25/15  5:26 PM  Result Value Ref Range   CDS serology specimen STAT   Comprehensive metabolic panel     Status: Abnormal   Collection Time: 12/25/15  5:26 PM  Result Value Ref Range   Sodium 127 (L) 135 - 145 mmol/L   Potassium 4.8 3.5 - 5.1 mmol/L   Chloride 83 (L) 101 - 111 mmol/L    CO2 25 22 - 32 mmol/L   Glucose, Bld 674 (HH) 65 - 99 mg/dL    Comment: CRITICAL RESULT CALLED TO, READ BACK BY AND VERIFIED WITH: J WATTS,RN 1810 12/25/2015 WBOND    BUN 81 (H) 6 - 20 mg/dL   Creatinine, Ser 2.80 (H) 0.61 - 1.24 mg/dL   Calcium 9.2 8.9 - 10.3 mg/dL   Total Protein 5.7 (L) 6.5 - 8.1 g/dL   Albumin 3.2 (L) 3.5 - 5.0 g/dL   AST 48 (H) 15 - 41 U/L   ALT 26 17 - 63 U/L   Alkaline Phosphatase 60 38 - 126 U/L   Total Bilirubin 2.0 (H) 0.3 - 1.2 mg/dL   GFR calc non Af Amer 21 (L) >60 mL/min   GFR calc Af Amer 24 (L) >60 mL/min    Comment: (NOTE) The eGFR has been calculated using the CKD EPI equation. This calculation has not been validated in all clinical situations. eGFR's persistently <60 mL/min signify possible Chronic Kidney Disease.    Anion gap 19 (H) 5 - 15  CBC     Status: Abnormal   Collection Time: 12/25/15  5:26 PM  Result Value Ref Range   WBC 13.1 (H) 4.0 - 10.5 K/uL   RBC 3.18 (L) 4.22 - 5.81 MIL/uL   Hemoglobin 10.0 (L) 13.0 - 17.0 g/dL   HCT 29.7 (L) 39.0 - 52.0 %   MCV 93.4 78.0 - 100.0 fL   MCH 31.4 26.0 - 34.0 pg   MCHC 33.7 30.0 - 36.0 g/dL   RDW 12.9 11.5 - 15.5 %   Platelets 159 150 - 400 K/uL  Protime-INR     Status: Abnormal   Collection Time: 12/25/15  5:26 PM  Result Value Ref Range   Prothrombin Time 20.5 (H) 11.6 - 15.2 seconds   INR 1.76 (H) 0.00 - 1.49  POC CBG, ED     Status: Abnormal   Collection Time: 12/25/15  6:53 PM  Result Value Ref Range   Glucose-Capillary 546 (H) 65 - 99 mg/dL  Urinalysis, Routine w reflex microscopic (not at [facility])     Status: Abnormal   Collection Time: 12/25/15  7:04 PM  Result Value Ref Range   Color, Urine YELLOW YELLOW   APPearance CLEAR CLEAR   Specific Gravity, Urine 1.025 1.005 - 1.030   pH 5.0 5.0 - 8.0   Glucose, UA >1000 (A) NEGATIVE mg/dL   Hgb urine dipstick NEGATIVE NEGATIVE  Bilirubin Urine NEGATIVE NEGATIVE   Ketones, ur NEGATIVE NEGATIVE mg/dL   Protein, ur NEGATIVE NEGATIVE  mg/dL   Nitrite NEGATIVE NEGATIVE   Leukocytes, UA NEGATIVE NEGATIVE  Urine microscopic-add on     Status: Abnormal   Collection Time: 12/25/15  7:04 PM  Result Value Ref Range   Squamous Epithelial / LPF NONE SEEN NONE SEEN   WBC, UA NONE SEEN 0 - 5 WBC/hpf   RBC / HPF NONE SEEN 0 - 5 RBC/hpf   Bacteria, UA NONE SEEN NONE SEEN   Casts HYALINE CASTS (A) NEGATIVE  POC CBG, ED     Status: Abnormal   Collection Time: 12/25/15  8:41 PM  Result Value Ref Range   Glucose-Capillary 490 (H) 65 - 99 mg/dL   Comment 1 Notify RN    Comment 2 Document in Chart    Ct Head Without Contrast  12/25/2015  CLINICAL DATA:  75 year old male with fall EXAM: CT HEAD WITHOUT CONTRAST TECHNIQUE: Contiguous axial images were obtained from the base of the skull through the vertex without intravenous contrast. COMPARISON:  CT dated 02/10/2014 FINDINGS: The ventricles and sulci are appropriate in size for patient's age. Mild periventricular and deep white matter hypodensities represent chronic microvascular ischemic changes. There is no intracranial hemorrhage. No mass effect or midline shift identified. There is mild mucoperiosteal thickening of paranasal sinuses. No air-fluid levels. Mastoid air cells are clear. The calvarium is intact. IMPRESSION: No acute intracranial hemorrhage. Mild age-related atrophy and chronic microvascular ischemic disease. Electronically Signed   By: Anner Crete M.D.   On: 12/25/2015 21:26   Ct Chest Wo Contrast  12/25/2015  ADDENDUM REPORT: 12/25/2015 14:43 ADDENDUM: There is cholelithiasis. Electronically Signed   By: Lowella Grip III M.D.   On: 12/25/2015 14:43  12/25/2015  CLINICAL DATA:  Pain with shortness of breath following fall EXAM: CT CHEST WITHOUT CONTRAST TECHNIQUE: Multidetector CT imaging of the chest was performed following the standard protocol without IV contrast. COMPARISON:  None. FINDINGS: Mediastinum/Lymph Nodes: Thyroid appears normal. No demonstrable adenopathy.  No thoracic aortic aneurysm. Visualized great vessels appear unremarkable on this noncontrast enhanced study. Pericardium is not appreciably thickened. There are multiple foci of coronary artery calcification. Lungs/Pleura: There is a large left-sided pneumothorax. There is a sizable left pleural effusion with left base consolidation. There is mild atelectasis in the right base. Right lung is otherwise clear. Upper abdomen: There is a small gallstone in gallbladder. Gallbladder wall does not appear thickened. There is atherosclerotic calcification in the upper aorta. The visualized upper abdominal structures otherwise appear unremarkable. Musculoskeletal: There is extensive hematoma throughout the left postero lateral chest wall with hemorrhage in several peripheral muscles on the left postero laterally. There are comminuted fractures of the left posterior and posterior lateral third, fourth, fifth, and sixth ribs. There are comminuted lateral fractures of the left fourth, fifth, sixth, seventh, eighth, and ninth ribs. There is a comminuted fracture of the posterior left eleventh rib. There is anterior wedging of the T5 vertebral body without retropulsion of bone. There is degenerative change in the thoracic spine and both shoulders. IMPRESSION: Large left hydropneumothorax with moderate left effusion and left base consolidation. Multiple comminuted fractures of left ribs as noted above. Mild anterior wedging of the T5 vertebral body without retropulsion of bone or canal stenosis. Extensive hematoma throughout the left posterolateral chest wall and musculature. Note that there is degenerative change in the shoulders and thoracic spine. Critical Value/emergent results were called by telephone at the time  of interpretation on 12/25/2015 at 1:03 pm to Dr. Vickki Hearing , who verbally acknowledged these results. Electronically Signed: By: Lowella Grip III M.D. On: 12/25/2015 13:03   Dg Pelvis Portable  12/25/2015   CLINICAL DATA:  Fall.  This occurred 12/23/2015. EXAM: PORTABLE PELVIS 1-2 VIEWS COMPARISON:  None. FINDINGS: There is no evidence of pelvic fracture or diastasis. No pelvic bone lesions are seen. IMPRESSION: Negative. If concern for occult hip fracture, dedicated hip radiographs are recommended as the femoral necks are not optimally visualized on this portable supine radiograph. Electronically Signed   By: Staci Righter M.D.   On: 12/25/2015 18:37   Ct T-spine No Charge  12/25/2015  CLINICAL DATA:  75 year old male with thoracic spine and chest wall pain. Question vertebral and/or left rib fracture. Fall 2 days ago. Initial encounter. EXAM: CT THORACIC SPINE WITHOUT CONTRAST TECHNIQUE: Multidetector CT imaging of the thoracic spine was performed without intravenous contrast administration. Multiplanar CT image reconstructions were also generated. COMPARISON:  Chest CT from today reported separately. Thoracic spine radiographs 09/08/2014. FINDINGS: There are acute fractures of the left ribs 3 through 12. Many of these are comminuted and/or displaced. Several are fractured in 2 places (at least ribs ribs 7 through 9) are fractured in 2 places. The entire left lateral ribs are included on the Chest reported separately. Left pneumothorax and probable large hemothorax, see chest findings reported separately. Cervicothoracic junction alignment is within normal limits. Lower cervical advanced disc and endplate degeneration including vacuum disc. Exaggerated upper thoracic kyphosis with mild T5 wedging appears stable since 2015. No thoracic vertebral fracture identified. Intermittent thoracic posterior element degeneration. There is a nondisplaced left L1 transverse process fracture. No thoracic vertebral fracture identified. The visualized posterior right ribs are intact. Negative visualized upper abdominal viscera. Calcified aortic atherosclerosis. IMPRESSION: 1. Acute fractures of all left ribs except 1 and 2, with  most fractures comminuted and/or displaced, and at least 3 flail segments (7-9). 2. Large left hemopneumothorax, see chest CT reported separately, and critical findings on this study have been called to the ordering provider by Dr. Jasmine December. 3. No thoracic spine fracture identified. There is a nondisplaced left L1 transverse process fracture (stable injury). Electronically Signed   By: Genevie Ann M.D.   On: 12/25/2015 13:17   Dg Chest Portable 1 View  12/25/2015  CLINICAL DATA:  75 year old male with multiple left rib fractures and status post left-sided chest tube placement. Patient having difficulty and painful breathing. EXAM: PORTABLE CHEST 1 VIEW COMPARISON:  Chest radiograph dated 02/10/2014 and chest CT dated 12/25/2015 FINDINGS: The left-sided chest tube is noted with tip projecting over the left hilar region. There is opacification of the left lung base with silhouetting of the left hemidiaphragm compatible with combination consolidative changes of the left lower lobe and left pleural effusion. There is silhouetting of the left cardiac border. The right lung is clear. Minimal pneumothorax may be present at the left apical region measuring approximately 1 cm from the apex. There multiple left rib fractures as seen on the chest CT. Left chest wall soft tissue emphysema noted. There is severe osteoarthritic changes of the right shoulder. IMPRESSION: Left lung base opacity compatible with combination of consolidative changes and effusion. Left chest tube with tip projecting over the left hilum. Minimal left apical pneumothorax may be present. Electronically Signed   By: Anner Crete M.D.   On: 12/25/2015 18:44   Dg Chest Portable 1 View  12/25/2015  CLINICAL DATA:  Fall 2 days  ago.  Difficulty breathing since fall. EXAM: PORTABLE CHEST 1 VIEW COMPARISON:  Current chest CT. Previous chest radiograph, 02/10/2014. FINDINGS: As noted on the current chest CT, there multiple left-sided rib fractures, several  mildly displaced. There is a hydro pneumothorax. Left lung shows approximately 30-40% collapse. Right lung is clear. Healed fracture of the right lateral seventh rib. No right pleural effusion or pneumothorax. Cardiac silhouette is normal in size. No mediastinal or hilar masses. There are advanced chronic arthropathic changes of the right shoulder. IMPRESSION: 1. As noted on the current chest CT, there is a significant left hydro pneumothorax with multiple associated left-sided rib fractures. 2. No other acute finding. Electronically Signed   By: Lajean Manes M.D.   On: 12/25/2015 16:48    Review of Systems  Constitutional: Negative for chills.  Respiratory: Positive for shortness of breath.   Cardiovascular: Positive for chest pain.  Gastrointestinal: Negative for vomiting, abdominal pain, anorexia and change in bowel habit.  Musculoskeletal: Positive for joint swelling. Negative for arthralgias and neck pain.  Neurological: Positive for loss of consciousness. Negative for vertigo, weakness, numbness and headaches.  Endo/Heme/Allergies:       Diabetic with changes in blood sugar   Psychiatric/Behavioral: Negative.     Blood pressure 132/58, pulse 92, temperature 98.1 F (36.7 C), temperature source Oral, resp. rate 24, SpO2 95 %. Physical Exam  Constitutional: He is oriented to person, place, and time. He appears well-developed and well-nourished. No distress.  HENT:  Head: Normocephalic.  Right Ear: External ear normal.  Left Ear: External ear normal.  Mouth/Throat: Oropharynx is clear and moist.  Abrasion right frontoparietal region  Eyes: Conjunctivae are normal. Pupils are equal, round, and reactive to light. Right eye exhibits no discharge. Left eye exhibits no discharge. No scleral icterus.  Neck: Normal range of motion. Neck supple. No tracheal deviation present. No thyromegaly present.  Cardiovascular: Normal rate, regular rhythm and intact distal pulses.  Exam reveals no gallop  and no friction rub.   No murmur heard. Respiratory: Effort normal. No respiratory distress. He has no wheezes. He has no rales. He exhibits tenderness.  GI: Soft. Bowel sounds are normal. He exhibits no distension and no mass. There is no tenderness. There is no rebound and no guarding.  Musculoskeletal: Normal range of motion. He exhibits no edema or tenderness.  Lymphadenopathy:    He has no cervical adenopathy.  Neurological: He is alert and oriented to person, place, and time. Coordination normal.  Skin: Skin is warm and dry. No rash noted. He is not diaphoretic. No erythema. No pallor.  Numerous small bruises all skin.  Psychiatric: He has a normal mood and affect. His behavior is normal. Judgment and thought content normal.     Assessment/Plan Falls LOC Left flail chest (rib fractures 3-11) Left hemopneumothorax ? T5 fracture Anticoagulation with xarelto DM HTN Atrial fibrillation Hypochloremia Acute kidney injury Hyponatremia Hypovolemia Acute blood loss anemia  Head CT (already performed) since patient on anticoagulation and has abrasion on head- negative Left chest tube (placed by ED) Pain control Pulmonary toilet Hold anticoagulation Consult medicine for assistance with blood sugars and electrolytes - suspect patient hypovolemic from hyperglycemia.   Home antihypertensives Rehydrate given acute kidney injury  Discuss potential for T5 injury with neurosurgery   Perri Lamagna 12/25/2015, 10:03 PM   Procedures  was

## 2015-12-25 NOTE — ED Notes (Signed)
Paged Dr. Barry Dienes to Dr. Ashok Cordia

## 2015-12-25 NOTE — ED Notes (Signed)
CBG 505. RN notified.

## 2015-12-25 NOTE — ED Notes (Signed)
Pt's CBG result was 490. Informed Pamala Hurry - RN.

## 2015-12-25 NOTE — ED Notes (Signed)
Pt had a fall on Sunday and was told he had a few broken ribs. He has been having trouble breathing and went to doctor today and was told to come to ER for possible pneumothorax. Room air sats 73%. Pt placed on 6L [place] and O2 sats 88%. Pt to be placed on NRB mask at 10L . Now satting at 90%.

## 2015-12-25 NOTE — ED Provider Notes (Signed)
CSN: [id number]     Arrival date & time 12/25/15  1606 History   First MD Initiated Contact with Patient 12/25/15 1651     Chief Complaint  Patient presents with  . Shortness of Breath     (Consider location/radiation/quality/duration/timing/severity/associated sxs/prior Treatment) Patient is a 75 y.o. male presenting with fall. The history is provided by the patient and a relative.  Fall This is a new problem. The current episode started in the past 7 days. The problem occurs constantly. The problem has been gradually worsening. Associated symptoms include chest pain and coughing (and sob). Pertinent negatives include no abdominal pain. Nothing aggravates the symptoms. He has tried nothing for the symptoms. The treatment provided no relief.    Past Medical History  Diagnosis Date  . Palpitations 07/16/2005    normal nuclear study  . Tachycardia   . Diabetes mellitus   . Hypertension   . Hyperlipidemia   . Colon cancer ([place])   . Colon polyp   . Broken neck ([place])     in March 2015   Past Surgical History  Procedure Laterality Date  . Appendectomy  2001  . Colon resection    . Elbow fracture surgery    . Arm surgery      right shoulder surgery  . Ankle surgery      right   Family History  Problem Relation Age of Onset  . Heart disease Father   . Colon cancer Neg Hx   . Esophageal cancer Neg Hx   . Rectal cancer Neg Hx   . Stomach cancer Neg Hx    Social History  Substance Use Topics  . Smoking status: Never Smoker   . Smokeless tobacco: Never Used     Comment: occasional cigar  . Alcohol Use: No    Review of Systems  Unable to perform ROS: Severe respiratory distress  Respiratory: Positive for cough (and sob).   Cardiovascular: Positive for chest pain.  Gastrointestinal: Negative for abdominal pain.      Allergies  Review of patient's allergies indicates no known allergies.  Home Medications   Prior to Admission medications   Medication Sig Start Date  End Date Taking? Authorizing Provider  amLODipine (NORVASC) 10 MG tablet Take 10 mg by mouth daily.   Yes Historical Provider, MD  Ascorbic Acid (VITAMIN C) 1000 MG tablet Take 1,000 mg by mouth daily.   Yes Historical Provider, MD  Chelated Magnesium 100 MG TABS Take 1 tablet by mouth daily.    Yes Historical Provider, MD  Cyanocobalamin (VITAMIN B 12 PO) Take 1 tablet by mouth daily. Taking 1000 daily   Yes Historical Provider, MD  Docosahexaenoic Acid (DHA OMEGA 3 PO) Take 1,200 mg by mouth daily.    Yes Historical Provider, MD  furosemide (LASIX) 40 MG tablet Take 20-40 mg by mouth daily. Alternate 20 & 40 mg dose   Yes Historical Provider, MD  hydrochlorothiazide (HYDRODIURIL) 25 MG tablet Take 25 mg by mouth daily.   Yes Historical Provider, MD  insulin glargine (LANTUS) 100 UNIT/ML injection Inject 15 Units into the skin 2 (two) times daily.    Yes Historical Provider, MD  insulin lispro (HUMALOG) 100 UNIT/ML injection Inject 4-16 Units into the skin 3 (three) times daily before meals. Taking 12 units  Am; 10 UNITS  noon and 12 UNITS units at  6 pm   Yes Historical Provider, MD  lovastatin (MEVACOR) 20 MG tablet Take 20 mg by mouth at bedtime.   Yes  Historical Provider, MD  metoprolol succinate (TOPROL XL) 25 MG 24 hr tablet Take 1 tablet (25 mg total) by mouth daily. 10/31/14  Yes Darlin Coco, MD  Potassium 99 MG TABS Take 1 tablet by mouth daily.    Yes Historical Provider, MD  ramipril (ALTACE) 10 MG tablet Take 10 mg by mouth daily.   Yes Historical Provider, MD  rivaroxaban (XARELTO) 20 MG TABS tablet Take 1 tablet (20 mg total) by mouth daily. 05/09/15  Yes Darlin Coco, MD  Tamsulosin HCl (FLOMAX) 0.4 MG CAPS Take 0.4 mg by mouth daily.    Yes Historical Provider, MD  vitamin E 400 UNIT capsule Take 400 Units by mouth daily.   Yes Historical Provider, MD  Zinc 25 MG TABS Take 1 tablet by mouth daily.   Yes Historical Provider, MD  zolpidem (AMBIEN) 10 MG tablet Take 10 mg by  mouth at bedtime as needed (sleep).    Yes Historical Provider, MD   BP 129/71 mmHg  Pulse 113  Temp(Src) 98.1 F (36.7 C) (Oral)  Resp 29  SpO2 95% Physical Exam  Constitutional: He is oriented to person, place, and time. He appears well-developed and well-nourished.  On NRB. Struggles with full sentences   HENT:  Head: Normocephalic and atraumatic.  Mild abrasions to forehead.  Eyes: Pupils are equal, round, and reactive to light.  Neck: Normal range of motion. Neck supple.  Cardiovascular: Normal rate, regular rhythm, normal heart sounds and intact distal pulses.   Pulmonary/Chest: He is in respiratory distress. He exhibits tenderness.  Decreased breath sounds on the left lung fields.  Abdominal: Soft. He exhibits no distension. There is no tenderness. There is no rebound.  Musculoskeletal: Normal range of motion. He exhibits tenderness (scattered bruises throughout upper extremities). He exhibits no edema.  Neurological: He is alert and oriented to person, place, and time. No cranial nerve deficit. He exhibits normal muscle tone. Coordination normal.  Skin: Skin is warm and dry. No rash noted. No erythema. No pallor.  Psychiatric: He has a normal mood and affect.  Nursing note and vitals reviewed.   ED Course  CHEST TUBE INSERTION Date/Time: 12/25/2015 6:06 PM Performed by: Heriberto Antigua Authorized by: Heriberto Antigua Consent: The procedure was performed in an emergent situation. Verbal consent obtained. Written consent obtained. Risks and benefits: risks, benefits and alternatives were discussed Consent given by: patient Patient understanding: patient states understanding of the procedure being performed Required items: required blood products, implants, devices, and special equipment available Patient identity confirmed: arm band Time out: Immediately prior to procedure a "time out" was called to verify the correct patient, procedure, equipment, support staff and site/side  marked as required. Indications: hemopneumothorax Anesthesia: local infiltration Local anesthetic: lidocaine 1% with epinephrine Anesthetic total: 20 ml Preparation: skin prepped with ChloraPrep Placement location: left lateral Scalpel size: 11 Tube size: 32 [place] Dissection instrument: finger and Kelly clamp Ultrasound guidance: no Tension pneumothorax heard: no Tube connected to: suction Drainage characteristics: bloody Drainage amount: 300 ml Suture material: 0 silk Dressing: Xeroform gauze and 4x4 sterile gauze Post-insertion x-ray findings: tube in good position Patient tolerance: Patient tolerated the procedure well with no immediate complications   (including critical care time) Labs Review Labs Reviewed  COMPREHENSIVE METABOLIC PANEL - Abnormal; Notable for the following:    Sodium 127 (*)    Chloride 83 (*)    Glucose, Bld 674 (*)    BUN 81 (*)    Creatinine, Ser 2.80 (*)    Total Protein  5.7 (*)    Albumin 3.2 (*)    AST 48 (*)    Total Bilirubin 2.0 (*)    GFR calc non Af Amer 21 (*)    GFR calc Af Amer 24 (*)    Anion gap 19 (*)    All other components within normal limits  CBC - Abnormal; Notable for the following:    WBC 13.1 (*)    RBC 3.18 (*)    Hemoglobin 10.0 (*)    HCT 29.7 (*)    All other components within normal limits  PROTIME-INR - Abnormal; Notable for the following:    Prothrombin Time 20.5 (*)    INR 1.76 (*)    All other components within normal limits  URINALYSIS, ROUTINE W REFLEX MICROSCOPIC (NOT AT [facility] [place]) - Abnormal; Notable for the following:    Glucose, UA >1000 (*)    All other components within normal limits  URINE MICROSCOPIC-ADD ON - Abnormal; Notable for the following:    Casts HYALINE CASTS (*)    All other components within normal limits  CBG MONITORING, ED - Abnormal; Notable for the following:    Glucose-Capillary 546 (*)    All other components within normal limits  CBG MONITORING, ED - Abnormal; Notable for the  following:    Glucose-Capillary 490 (*)    All other components within normal limits  CBG MONITORING, ED - Abnormal; Notable for the following:    Glucose-Capillary 484 (*)    All other components within normal limits  CDS SEROLOGY  ETHANOL  BLOOD GAS, VENOUS  CBC  BASIC METABOLIC PANEL  PROTIME-INR  SAMPLE TO BLOOD BANK    Imaging Review Ct Head Without Contrast  12/25/2015  CLINICAL DATA:  75 year old male with fall EXAM: CT HEAD WITHOUT CONTRAST TECHNIQUE: Contiguous axial images were obtained from the base of the skull through the vertex without intravenous contrast. COMPARISON:  CT dated 02/10/2014 FINDINGS: The ventricles and sulci are appropriate in size for patient's age. Mild periventricular and deep white matter hypodensities represent chronic microvascular ischemic changes. There is no intracranial hemorrhage. No mass effect or midline shift identified. There is mild mucoperiosteal thickening of paranasal sinuses. No air-fluid levels. Mastoid air cells are clear. The calvarium is intact. IMPRESSION: No acute intracranial hemorrhage. Mild age-related atrophy and chronic microvascular ischemic disease. Electronically Signed   By: Anner Crete M.D.   On: 12/25/2015 21:26   Ct Chest Wo Contrast  12/25/2015  ADDENDUM REPORT: 12/25/2015 14:43 ADDENDUM: There is cholelithiasis. Electronically Signed   By: Lowella Grip III M.D.   On: 12/25/2015 14:43  12/25/2015  CLINICAL DATA:  Pain with shortness of breath following fall EXAM: CT CHEST WITHOUT CONTRAST TECHNIQUE: Multidetector CT imaging of the chest was performed following the standard protocol without IV contrast. COMPARISON:  None. FINDINGS: Mediastinum/Lymph Nodes: Thyroid appears normal. No demonstrable adenopathy. No thoracic aortic aneurysm. Visualized great vessels appear unremarkable on this noncontrast enhanced study. Pericardium is not appreciably thickened. There are multiple foci of coronary artery calcification.  Lungs/Pleura: There is a large left-sided pneumothorax. There is a sizable left pleural effusion with left base consolidation. There is mild atelectasis in the right base. Right lung is otherwise clear. Upper abdomen: There is a small gallstone in gallbladder. Gallbladder wall does not appear thickened. There is atherosclerotic calcification in the upper aorta. The visualized upper abdominal structures otherwise appear unremarkable. Musculoskeletal: There is extensive hematoma throughout the left postero lateral chest wall with hemorrhage in several peripheral muscles on the  left postero laterally. There are comminuted fractures of the left posterior and posterior lateral third, fourth, fifth, and sixth ribs. There are comminuted lateral fractures of the left fourth, fifth, sixth, seventh, eighth, and ninth ribs. There is a comminuted fracture of the posterior left eleventh rib. There is anterior wedging of the T5 vertebral body without retropulsion of bone. There is degenerative change in the thoracic spine and both shoulders. IMPRESSION: Large left hydropneumothorax with moderate left effusion and left base consolidation. Multiple comminuted fractures of left ribs as noted above. Mild anterior wedging of the T5 vertebral body without retropulsion of bone or canal stenosis. Extensive hematoma throughout the left posterolateral chest wall and musculature. Note that there is degenerative change in the shoulders and thoracic spine. Critical Value/emergent results were called by telephone at the time of interpretation on 12/25/2015 at 1:03 pm to Dr. Vickki Hearing , who verbally acknowledged these results. Electronically Signed: By: Lowella Grip III M.D. On: 12/25/2015 13:03   Dg Pelvis Portable  12/25/2015  CLINICAL DATA:  Fall.  This occurred 12/23/2015. EXAM: PORTABLE PELVIS 1-2 VIEWS COMPARISON:  None. FINDINGS: There is no evidence of pelvic fracture or diastasis. No pelvic bone lesions are seen. IMPRESSION:  Negative. If concern for occult hip fracture, dedicated hip radiographs are recommended as the femoral necks are not optimally visualized on this portable supine radiograph. Electronically Signed   By: Staci Righter M.D.   On: 12/25/2015 18:37   Ct T-spine No Charge  12/25/2015  CLINICAL DATA:  75 year old male with thoracic spine and chest wall pain. Question vertebral and/or left rib fracture. Fall 2 days ago. Initial encounter. EXAM: CT THORACIC SPINE WITHOUT CONTRAST TECHNIQUE: Multidetector CT imaging of the thoracic spine was performed without intravenous contrast administration. Multiplanar CT image reconstructions were also generated. COMPARISON:  Chest CT from today reported separately. Thoracic spine radiographs 09/08/2014. FINDINGS: There are acute fractures of the left ribs 3 through 12. Many of these are comminuted and/or displaced. Several are fractured in 2 places (at least ribs ribs 7 through 9) are fractured in 2 places. The entire left lateral ribs are included on the Chest reported separately. Left pneumothorax and probable large hemothorax, see chest findings reported separately. Cervicothoracic junction alignment is within normal limits. Lower cervical advanced disc and endplate degeneration including vacuum disc. Exaggerated upper thoracic kyphosis with mild T5 wedging appears stable since 2015. No thoracic vertebral fracture identified. Intermittent thoracic posterior element degeneration. There is a nondisplaced left L1 transverse process fracture. No thoracic vertebral fracture identified. The visualized posterior right ribs are intact. Negative visualized upper abdominal viscera. Calcified aortic atherosclerosis. IMPRESSION: 1. Acute fractures of all left ribs except 1 and 2, with most fractures comminuted and/or displaced, and at least 3 flail segments (7-9). 2. Large left hemopneumothorax, see chest CT reported separately, and critical findings on this study have been called to the  ordering provider by Dr. Jasmine December. 3. No thoracic spine fracture identified. There is a nondisplaced left L1 transverse process fracture (stable injury). Electronically Signed   By: Genevie Ann M.D.   On: 12/25/2015 13:17   Dg Chest Portable 1 View  12/25/2015  CLINICAL DATA:  75 year old male with multiple left rib fractures and status post left-sided chest tube placement. Patient having difficulty and painful breathing. EXAM: PORTABLE CHEST 1 VIEW COMPARISON:  Chest radiograph dated 02/10/2014 and chest CT dated 12/25/2015 FINDINGS: The left-sided chest tube is noted with tip projecting over the left hilar region. There is opacification of the left  lung base with silhouetting of the left hemidiaphragm compatible with combination consolidative changes of the left lower lobe and left pleural effusion. There is silhouetting of the left cardiac border. The right lung is clear. Minimal pneumothorax may be present at the left apical region measuring approximately 1 cm from the apex. There multiple left rib fractures as seen on the chest CT. Left chest wall soft tissue emphysema noted. There is severe osteoarthritic changes of the right shoulder. IMPRESSION: Left lung base opacity compatible with combination of consolidative changes and effusion. Left chest tube with tip projecting over the left hilum. Minimal left apical pneumothorax may be present. Electronically Signed   By: Anner Crete M.D.   On: 12/25/2015 18:44   Dg Chest Portable 1 View  12/25/2015  CLINICAL DATA:  Fall 2 days ago.  Difficulty breathing since fall. EXAM: PORTABLE CHEST 1 VIEW COMPARISON:  Current chest CT. Previous chest radiograph, 02/10/2014. FINDINGS: As noted on the current chest CT, there multiple left-sided rib fractures, several mildly displaced. There is a hydro pneumothorax. Left lung shows approximately 30-40% collapse. Right lung is clear. Healed fracture of the right lateral seventh rib. No right pleural effusion or pneumothorax.  Cardiac silhouette is normal in size. No mediastinal or hilar masses. There are advanced chronic arthropathic changes of the right shoulder. IMPRESSION: 1. As noted on the current chest CT, there is a significant left hydro pneumothorax with multiple associated left-sided rib fractures. 2. No other acute finding. Electronically Signed   By: Lajean Manes M.D.   On: 12/25/2015 16:48   I have personally reviewed and evaluated these images and lab results as part of my medical decision-making.   EKG Interpretation   Date/Time:  Tuesday December 25 2015 16:10:52 EST Ventricular Rate:  89 PR Interval:  158 QRS Duration: 98 QT Interval:  382 QTC Calculation: 464 R Axis:   -18 Text Interpretation:  Normal sinus rhythm Incomplete right bundle branch  block Nonspecific ST and T wave abnormality Prolonged QT Abnormal ECG No  significant change since last tracing Confirmed by LITTLE MD, RACHEL  [id number]) on 12/25/2015 6:21:42 PM      MDM   Final diagnoses:  Pneumothorax  Fall, initial encounter  Hemopneumothorax on left    Patient is a 75 year old male who presents for evaluation after a fall and a left-sided hydropneumothorax. He states he fell some point in the night 2 days ago and went to his doctor's today who obtained outpatient CT scan which showed multiple rib fractures as well as a likely hemoopneumothorax. Upon initial evaluation, patient hypoxic with a pulse ox of 73% but otherwise hemodynamically stable. O2 sats improved on nonrebreather. Chest tube placed as above after x-ray confirms hydropneumothorax. Chest tube with roughly 300 mL of output. Head CT obtained without acute intracranial abnormalities. Patient found to be hyperglycemic with blood sugars in the 600s as well as hyponatremic. Patient also has an AKI with a creatinine at 2.8 and BUN of 81. Patient given IV fluids and insulin. No evidence of DKA at this time.  patient will be admitted to trauma for further management and  evaluation.    Heriberto Antigua, MD 12/26/15 [id number]  Sharlett Iles, MD 12/27/15 [phone]

## 2015-12-25 NOTE — Sedation Documentation (Signed)
Pt tolerating procedure well.

## 2015-12-26 ENCOUNTER — Encounter (HOSPITAL_COMMUNITY): Payer: Self-pay | Admitting: *Deleted

## 2015-12-26 ENCOUNTER — Inpatient Hospital Stay (HOSPITAL_COMMUNITY): Payer: Medicare Other

## 2015-12-26 DIAGNOSIS — E119 Type 2 diabetes mellitus without complications: Secondary | ICD-10-CM

## 2015-12-26 DIAGNOSIS — Z794 Long term (current) use of insulin: Secondary | ICD-10-CM

## 2015-12-26 LAB — CBC
HCT: 28.7 % — ABNORMAL LOW (ref 39.0–52.0)
Hemoglobin: 9.7 g/dL — ABNORMAL LOW (ref 13.0–17.0)
MCH: 31.4 pg (ref 26.0–34.0)
MCHC: 33.8 g/dL (ref 30.0–36.0)
MCV: 92.9 fL (ref 78.0–100.0)
Platelets: 164 10*3/uL (ref 150–400)
RBC: 3.09 MIL/uL — ABNORMAL LOW (ref 4.22–5.81)
RDW: 12.8 % (ref 11.5–15.5)
WBC: 9.5 10*3/uL (ref 4.0–10.5)

## 2015-12-26 LAB — BASIC METABOLIC PANEL
Anion gap: 10 (ref 5–15)
BUN: 61 mg/dL — ABNORMAL HIGH (ref 6–20)
CO2: 35 mmol/L — ABNORMAL HIGH (ref 22–32)
Calcium: 9 mg/dL (ref 8.9–10.3)
Chloride: 95 mmol/L — ABNORMAL LOW (ref 101–111)
Creatinine, Ser: 1.79 mg/dL — ABNORMAL HIGH (ref 0.61–1.24)
GFR calc Af Amer: 41 mL/min — ABNORMAL LOW (ref >60)
GFR calc non Af Amer: 36 mL/min — ABNORMAL LOW (ref >60)
Glucose, Bld: 197 mg/dL — ABNORMAL HIGH (ref 65–99)
Potassium: 3.7 mmol/L (ref 3.5–5.1)
Sodium: 140 mmol/L (ref 135–145)

## 2015-12-26 LAB — GLUCOSE, CAPILLARY
Glucose-Capillary: 140 mg/dL — ABNORMAL HIGH (ref 65–99)
Glucose-Capillary: 128 mg/dL — ABNORMAL HIGH (ref 65–99)
Glucose-Capillary: 112 mg/dL — ABNORMAL HIGH (ref 65–99)
Glucose-Capillary: 113 mg/dL — ABNORMAL HIGH (ref 65–99)
Glucose-Capillary: 344 mg/dL — ABNORMAL HIGH (ref 65–99)
Glucose-Capillary: 313 mg/dL — ABNORMAL HIGH (ref 65–99)
Glucose-Capillary: 252 mg/dL — ABNORMAL HIGH (ref 65–99)

## 2015-12-26 LAB — CBG MONITORING, ED
Glucose-Capillary: 204 mg/dL — ABNORMAL HIGH (ref 65–99)
Glucose-Capillary: 312 mg/dL — ABNORMAL HIGH (ref 65–99)
Glucose-Capillary: 378 mg/dL — ABNORMAL HIGH (ref 65–99)
Glucose-Capillary: 438 mg/dL — ABNORMAL HIGH (ref 65–99)

## 2015-12-26 LAB — MRSA PCR SCREENING: MRSA by PCR: NEGATIVE

## 2015-12-26 LAB — PROTIME-INR
INR: 1.44 (ref 0.00–1.49)
Prothrombin Time: 17.6 seconds — ABNORMAL HIGH (ref 11.6–15.2)

## 2015-12-26 MED ORDER — CETYLPYRIDINIUM CHLORIDE 0.05 % MT LIQD
7.0000 mL | Freq: Two times a day (BID) | OROMUCOSAL | Status: DC
  Administered 2015-12-26 – 2015-12-28 (×5): 7 mL via OROMUCOSAL

## 2015-12-26 MED ORDER — SODIUM CHLORIDE 0.9 % IV SOLN
INTRAVENOUS | Status: DC
  Administered 2015-12-26: 11:00:00 via INTRAVENOUS

## 2015-12-26 MED ORDER — INSULIN DETEMIR 100 UNIT/ML ~~LOC~~ SOLN
10.0000 [IU] | Freq: Two times a day (BID) | SUBCUTANEOUS | Status: DC
  Administered 2015-12-26 – 2015-12-28 (×4): 10 [IU] via SUBCUTANEOUS
  Filled 2015-12-26 (×6): qty 0.1

## 2015-12-26 MED ORDER — SODIUM CHLORIDE 0.9 % IV SOLN
250.0000 mL | INTRAVENOUS | Status: DC | PRN
Start: 2015-12-26 — End: 2015-12-26

## 2015-12-26 MED ORDER — INSULIN ASPART 100 UNIT/ML ~~LOC~~ SOLN
0.0000 [IU] | Freq: Three times a day (TID) | SUBCUTANEOUS | Status: DC
  Administered 2015-12-26 (×2): 11 [IU] via SUBCUTANEOUS
  Administered 2015-12-27: 3 [IU] via SUBCUTANEOUS
  Administered 2015-12-27: 2 [IU] via SUBCUTANEOUS
  Administered 2015-12-27 – 2015-12-28 (×2): 5 [IU] via SUBCUTANEOUS
  Administered 2015-12-28: 8 [IU] via SUBCUTANEOUS
  Administered 2015-12-28: 3 [IU] via SUBCUTANEOUS
  Administered 2015-12-29: 5 [IU] via SUBCUTANEOUS
  Administered 2015-12-29: 2 [IU] via SUBCUTANEOUS
  Administered 2015-12-30 (×2): 5 [IU] via SUBCUTANEOUS

## 2015-12-26 MED ORDER — INSULIN DETEMIR 100 UNIT/ML ~~LOC~~ SOLN
10.0000 [IU] | Freq: Every day | SUBCUTANEOUS | Status: DC
  Filled 2015-12-26: qty 0.1

## 2015-12-26 MED ORDER — ZOLPIDEM TARTRATE 5 MG PO TABS: 5.0000 mg | ORAL_TABLET | Freq: Every evening | ORAL | Status: DC | PRN

## 2015-12-26 NOTE — ED Notes (Addendum)
Patient moved to Room 16.  Patient alert and understands to call to urinate and not to get up.  Does become confused at times.  Patient requesting ice chips.  SCD's on bilaterally, oxygen at 5 liters via [place].

## 2015-12-26 NOTE — ED Notes (Signed)
Called son and left message as to the room number

## 2015-12-26 NOTE — Progress Notes (Signed)
Patient confused disoriented x 2.  CT dsg. saturated with sanguinous drainage and was changed.  Pain management achieved with re-positioning and medication.   Unable to follow instructions at this time and is very weak.  Unable to focus on a task.  Thoughts wander easily.

## 2015-12-26 NOTE — ED Notes (Signed)
CBG: 204. RN notified. 

## 2015-12-26 NOTE — Progress Notes (Signed)
Trauma Service Note  Subjective: Patient tolerating pain from his ribs well.  No acute distress.  Upset about his glucose  Objective: Vital signs in last 24 hours: Temp:  [98.1 F (36.7 C)-99.3 F (37.4 C)] 98.3 F (36.8 C) (02/08 0753) Pulse Rate:  [77-131] 93 (02/08 0753) Resp:  [16-35] 19 (02/08 0753) BP: (99-142)/(36-92) 116/61 mmHg (02/08 0753) SpO2:  [73 %-100 %] 96 % (02/08 0753) Weight:  [72.9 kg (160 lb 11.5 oz)] 72.9 kg (160 lb 11.5 oz) (02/08 0455) Last BM Date: 12/25/15  Intake/Output from previous day: 02/07 0701 - 02/08 0700 In: 2330.5 [P.O.:240; I.V.:2090.5] Out: 2375 [id number]; Drains:600; Chest Tube:50] Intake/Output this shift:    General: No acute distress  Lungs: Clear.  Decreased in the left base.  CXR shows significant atelectasis in the LLL and possible effusion.  CT in place and draining well.  Not a lote of output  Abd: Benign.  Patient hungry  Extremities: No changes.  No obvious fractures  Neuro: Intact  Lab Results: CBC   Recent Labs  12/25/15 1726 12/26/15 0345  WBC 13.1* 9.5  HGB 10.0* 9.7*  HCT 29.7* 28.7*  PLT 159 164   BMET  Recent Labs  12/25/15 1726 12/26/15 0345  NA 127* 140  K 4.8 3.7  CL 83* 95*  CO2 25 35*  GLUCOSE 674* 197*  BUN 81* 61*  CREATININE 2.80* 1.79*  CALCIUM 9.2 9.0   PT/INR  Recent Labs  12/25/15 1726 12/26/15 0345  LABPROT 20.5* 17.6*  INR 1.76* 1.44   ABG No results for input(s): PHART, HCO3 in the last 72 hours.  Invalid input(s): PCO2, PO2  Studies/Results: Ct Head Without Contrast  12/25/2015  CLINICAL DATA:  75 year old male with fall EXAM: CT HEAD WITHOUT CONTRAST TECHNIQUE: Contiguous axial images were obtained from the base of the skull through the vertex without intravenous contrast. COMPARISON:  CT dated 02/10/2014 FINDINGS: The ventricles and sulci are appropriate in size for patient's age. Mild periventricular and deep white matter hypodensities represent chronic  microvascular ischemic changes. There is no intracranial hemorrhage. No mass effect or midline shift identified. There is mild mucoperiosteal thickening of paranasal sinuses. No air-fluid levels. Mastoid air cells are clear. The calvarium is intact. IMPRESSION: No acute intracranial hemorrhage. Mild age-related atrophy and chronic microvascular ischemic disease. Electronically Signed   By: Anner Crete M.D.   On: 12/25/2015 21:26   Ct Chest Wo Contrast  12/25/2015  ADDENDUM REPORT: 12/25/2015 14:43 ADDENDUM: There is cholelithiasis. Electronically Signed   By: Lowella Grip III M.D.   On: 12/25/2015 14:43  12/25/2015  CLINICAL DATA:  Pain with shortness of breath following fall EXAM: CT CHEST WITHOUT CONTRAST TECHNIQUE: Multidetector CT imaging of the chest was performed following the standard protocol without IV contrast. COMPARISON:  None. FINDINGS: Mediastinum/Lymph Nodes: Thyroid appears normal. No demonstrable adenopathy. No thoracic aortic aneurysm. Visualized great vessels appear unremarkable on this noncontrast enhanced study. Pericardium is not appreciably thickened. There are multiple foci of coronary artery calcification. Lungs/Pleura: There is a large left-sided pneumothorax. There is a sizable left pleural effusion with left base consolidation. There is mild atelectasis in the right base. Right lung is otherwise clear. Upper abdomen: There is a small gallstone in gallbladder. Gallbladder wall does not appear thickened. There is atherosclerotic calcification in the upper aorta. The visualized upper abdominal structures otherwise appear unremarkable. Musculoskeletal: There is extensive hematoma throughout the left postero lateral chest wall with hemorrhage in several peripheral muscles on the left postero laterally.  There are comminuted fractures of the left posterior and posterior lateral third, fourth, fifth, and sixth ribs. There are comminuted lateral fractures of the left fourth, fifth,  sixth, seventh, eighth, and ninth ribs. There is a comminuted fracture of the posterior left eleventh rib. There is anterior wedging of the T5 vertebral body without retropulsion of bone. There is degenerative change in the thoracic spine and both shoulders. IMPRESSION: Large left hydropneumothorax with moderate left effusion and left base consolidation. Multiple comminuted fractures of left ribs as noted above. Mild anterior wedging of the T5 vertebral body without retropulsion of bone or canal stenosis. Extensive hematoma throughout the left posterolateral chest wall and musculature. Note that there is degenerative change in the shoulders and thoracic spine. Critical Value/emergent results were called by telephone at the time of interpretation on 12/25/2015 at 1:03 pm to Dr. Vickki Hearing , who verbally acknowledged these results. Electronically Signed: By: Lowella Grip III M.D. On: 12/25/2015 13:03   Dg Pelvis Portable  12/25/2015  CLINICAL DATA:  Fall.  This occurred 12/23/2015. EXAM: PORTABLE PELVIS 1-2 VIEWS COMPARISON:  None. FINDINGS: There is no evidence of pelvic fracture or diastasis. No pelvic bone lesions are seen. IMPRESSION: Negative. If concern for occult hip fracture, dedicated hip radiographs are recommended as the femoral necks are not optimally visualized on this portable supine radiograph. Electronically Signed   By: Staci Righter M.D.   On: 12/25/2015 18:37   Ct T-spine No Charge  12/25/2015  CLINICAL DATA:  75 year old male with thoracic spine and chest wall pain. Question vertebral and/or left rib fracture. Fall 2 days ago. Initial encounter. EXAM: CT THORACIC SPINE WITHOUT CONTRAST TECHNIQUE: Multidetector CT imaging of the thoracic spine was performed without intravenous contrast administration. Multiplanar CT image reconstructions were also generated. COMPARISON:  Chest CT from today reported separately. Thoracic spine radiographs 09/08/2014. FINDINGS: There are acute fractures of  the left ribs 3 through 12. Many of these are comminuted and/or displaced. Several are fractured in 2 places (at least ribs ribs 7 through 9) are fractured in 2 places. The entire left lateral ribs are included on the Chest reported separately. Left pneumothorax and probable large hemothorax, see chest findings reported separately. Cervicothoracic junction alignment is within normal limits. Lower cervical advanced disc and endplate degeneration including vacuum disc. Exaggerated upper thoracic kyphosis with mild T5 wedging appears stable since 2015. No thoracic vertebral fracture identified. Intermittent thoracic posterior element degeneration. There is a nondisplaced left L1 transverse process fracture. No thoracic vertebral fracture identified. The visualized posterior right ribs are intact. Negative visualized upper abdominal viscera. Calcified aortic atherosclerosis. IMPRESSION: 1. Acute fractures of all left ribs except 1 and 2, with most fractures comminuted and/or displaced, and at least 3 flail segments (7-9). 2. Large left hemopneumothorax, see chest CT reported separately, and critical findings on this study have been called to the ordering provider by Dr. Jasmine December. 3. No thoracic spine fracture identified. There is a nondisplaced left L1 transverse process fracture (stable injury). Electronically Signed   By: Genevie Ann M.D.   On: 12/25/2015 13:17   Dg Chest Port 1 View  12/26/2015  CLINICAL DATA:  Pneumothorax EXAM: PORTABLE CHEST 1 VIEW COMPARISON:  12/25/2015 FINDINGS: Normal cardiac silhouette. LEFT chest tube in place without appreciable pneumothorax. There is dense LEFT basilar atelectasis. IMPRESSION: Chest tube in place without pneumothorax. LEFT basilar atelectasis. No significant change. Electronically Signed   By: Suzy Bouchard M.D.   On: 12/26/2015 07:39   Dg Chest Portable 1  View  12/26/2015  CLINICAL DATA:  75 year old male with left-sided chest tube. Evaluate positioning of the chest  tube EXAM: PORTABLE CHEST 1 VIEW COMPARISON:  Multiple earlier radiograph dated 12/25/2015 FINDINGS: Left-sided chest tube with tip over the left hilar region similar to the prior chest tube. There has been slight interval decrease left lung base opacity. The right lung remains clear. No significant pneumothorax identified. Multiple left rib fractures again seen. Severe osteoarthritic changes of the right shoulder. IMPRESSION: Left-sided chest tube the tip over the left hilar region. Left lung base opacity appears slightly improved compared to prior study. Electronically Signed   By: Anner Crete M.D.   On: 12/26/2015 00:22   Dg Chest Portable 1 View  12/25/2015  CLINICAL DATA:  75 year old male with multiple left rib fractures and status post left-sided chest tube placement. Patient having difficulty and painful breathing. EXAM: PORTABLE CHEST 1 VIEW COMPARISON:  Chest radiograph dated 02/10/2014 and chest CT dated 12/25/2015 FINDINGS: The left-sided chest tube is noted with tip projecting over the left hilar region. There is opacification of the left lung base with silhouetting of the left hemidiaphragm compatible with combination consolidative changes of the left lower lobe and left pleural effusion. There is silhouetting of the left cardiac border. The right lung is clear. Minimal pneumothorax may be present at the left apical region measuring approximately 1 cm from the apex. There multiple left rib fractures as seen on the chest CT. Left chest wall soft tissue emphysema noted. There is severe osteoarthritic changes of the right shoulder. IMPRESSION: Left lung base opacity compatible with combination of consolidative changes and effusion. Left chest tube with tip projecting over the left hilum. Minimal left apical pneumothorax may be present. Electronically Signed   By: Anner Crete M.D.   On: 12/25/2015 18:44   Dg Chest Portable 1 View  12/25/2015  CLINICAL DATA:  Fall 2 days ago.  Difficulty  breathing since fall. EXAM: PORTABLE CHEST 1 VIEW COMPARISON:  Current chest CT. Previous chest radiograph, 02/10/2014. FINDINGS: As noted on the current chest CT, there multiple left-sided rib fractures, several mildly displaced. There is a hydro pneumothorax. Left lung shows approximately 30-40% collapse. Right lung is clear. Healed fracture of the right lateral seventh rib. No right pleural effusion or pneumothorax. Cardiac silhouette is normal in size. No mediastinal or hilar masses. There are advanced chronic arthropathic changes of the right shoulder. IMPRESSION: 1. As noted on the current chest CT, there is a significant left hydro pneumothorax with multiple associated left-sided rib fractures. 2. No other acute finding. Electronically Signed   By: Lajean Manes M.D.   On: 12/25/2015 16:48    Anti-infectives: Anti-infectives    None      Assessment/Plan: s/p  Advance diet [place] sliding scale for hyperglycemia, coming off insulin drip  OT/PT CXR in the AM, keep CT to suction.  LOS: 1 day   Kathryne Eriksson. Dahlia Bailiff, MD, FACS [phone] Trauma Surgeon 12/26/2015

## 2015-12-26 NOTE — Progress Notes (Addendum)
Inpatient Diabetes Program Recommendations  AACE/ADA: New Consensus Statement on Inpatient Glycemic Control (2015)  Target Ranges:  Prepandial:   less than 140 mg/dL      Peak postprandial:   less than 180 mg/dL (1-2 hours)      Critically ill patients:  140 - 180 mg/dL   Review of Glycemic Control  Noted patient to be transitioned off IV insulin to SQ. Home meds: Levemir 15 units BID, Humalog 12 units breakfast-10 units lunch-12 units supper Ordered: Levemir 10 units QHS, Novolog Moderate TID  Basal insulin scheduled for tonight, Please adjust basal insulin to be given now if the desire is to transition patient during day shift. May need to order Levemir BID, similar to home dose. Will also anticipate the patient needing meal coverage when eating.  Thanks,  Tama Headings RN, MSN, [facility] Inpatient Diabetes Coordinator Team Pager [phone] (8a-5p)

## 2015-12-26 NOTE — ED Notes (Signed)
Instructed on the use of the incentive spirometer  Patient demonstrated appropriately

## 2015-12-26 NOTE — Care Management Note (Signed)
Case Management Note  Patient Details  Name: Curtis Dunn MRN: [id number] Date of Birth: 02-23-1941  Subjective/Objective:   Pt admitted on 12/25/15 s/p falls with flail chest and hemopneumothorax.  PTA, pt independent of ADLS.                 Action/Plan: Will follow for discharge planning as pt progresses.   Would recommend PT/OT consults to determine needs for home.    Expected Discharge Date:                  Expected Discharge Plan:  [place]  In-House Referral:     Discharge planning Services  CM Consult  Post Acute Care Choice:    Choice offered to:     DME Arranged:    DME Agency:     HH Arranged:    [place] Agency:     Status of Service:  In process, will continue to follow  Medicare Important Message Given:    Date Medicare IM Given:    Medicare IM give by:    Date Additional Medicare IM Given:    Additional Medicare Important Message give by:     If discussed at [place] of Stay Meetings, dates discussed:    Additional Comments:  Reinaldo Raddle, RN, BSN  Trauma/Neuro ICU Case Manager [phone]

## 2015-12-26 NOTE — Progress Notes (Signed)
Patient seen and examined, blood sugar better, continue titrate insulin dose. Will continue follow the patient.

## 2015-12-27 ENCOUNTER — Inpatient Hospital Stay (HOSPITAL_COMMUNITY): Payer: Medicare Other

## 2015-12-27 LAB — CBC WITH DIFFERENTIAL/PLATELET
Basophils Absolute: 0 10*3/uL (ref 0.0–0.1)
Basophils Relative: 0 %
Eosinophils Absolute: 0.2 10*3/uL (ref 0.0–0.7)
Eosinophils Relative: 3 %
HCT: 29.1 % — ABNORMAL LOW (ref 39.0–52.0)
Hemoglobin: 9.8 g/dL — ABNORMAL LOW (ref 13.0–17.0)
Lymphocytes Relative: 12 %
Lymphs Abs: 1.1 10*3/uL (ref 0.7–4.0)
MCH: 32.5 pg (ref 26.0–34.0)
MCHC: 33.7 g/dL (ref 30.0–36.0)
MCV: 96.4 fL (ref 78.0–100.0)
Monocytes Absolute: 1.5 10*3/uL — ABNORMAL HIGH (ref 0.1–1.0)
Monocytes Relative: 17 %
Neutro Abs: 6 10*3/uL (ref 1.7–7.7)
Neutrophils Relative %: 68 %
Platelets: 186 10*3/uL (ref 150–400)
RBC: 3.02 MIL/uL — ABNORMAL LOW (ref 4.22–5.81)
RDW: 13 % (ref 11.5–15.5)
WBC: 8.8 10*3/uL (ref 4.0–10.5)

## 2015-12-27 LAB — HEMOGLOBIN A1C
Hgb A1c MFr Bld: 6.3 % — ABNORMAL HIGH (ref 4.8–5.6)
Mean Plasma Glucose: 134 mg/dL

## 2015-12-27 LAB — BASIC METABOLIC PANEL
Anion gap: 5 (ref 5–15)
BUN: 30 mg/dL — ABNORMAL HIGH (ref 6–20)
CO2: 36 mmol/L — ABNORMAL HIGH (ref 22–32)
Calcium: 8.5 mg/dL — ABNORMAL LOW (ref 8.9–10.3)
Chloride: 98 mmol/L — ABNORMAL LOW (ref 101–111)
Creatinine, Ser: 1.1 mg/dL (ref 0.61–1.24)
GFR calc Af Amer: >60 mL/min (ref >60)
GFR calc non Af Amer: >60 mL/min (ref >60)
Glucose, Bld: 164 mg/dL — ABNORMAL HIGH (ref 65–99)
Potassium: 3.9 mmol/L (ref 3.5–5.1)
Sodium: 139 mmol/L (ref 135–145)

## 2015-12-27 LAB — GLUCOSE, CAPILLARY
Glucose-Capillary: 150 mg/dL — ABNORMAL HIGH (ref 65–99)
Glucose-Capillary: 192 mg/dL — ABNORMAL HIGH (ref 65–99)
Glucose-Capillary: 223 mg/dL — ABNORMAL HIGH (ref 65–99)
Glucose-Capillary: 193 mg/dL — ABNORMAL HIGH (ref 65–99)

## 2015-12-27 MED ORDER — POLYETHYLENE GLYCOL 3350 17 G PO PACK
17.0000 g | PACK | Freq: Every day | ORAL | Status: DC
  Administered 2015-12-27 – 2015-12-30 (×4): 17 g via ORAL
  Filled 2015-12-27 (×4): qty 1

## 2015-12-27 MED ORDER — SENNOSIDES-DOCUSATE SODIUM 8.6-50 MG PO TABS
2.0000 | ORAL_TABLET | Freq: Two times a day (BID) | ORAL | Status: DC
Start: 2015-12-27 — End: 2015-12-30
  Administered 2015-12-27 – 2015-12-30 (×7): 2 via ORAL
  Filled 2015-12-27 (×7): qty 2

## 2015-12-27 NOTE — Evaluation (Signed)
Physical Therapy Evaluation Patient Details Name: Curtis Dunn MRN: [id number] DOB: 12-03-1940 Today's Date: 12/27/2015   History of Present Illness  Pt admitted after fall with left rib fx 3-8 HPTX. PMHx: DM,AFib, HTN, colon CA  Clinical Impression  Pt pleasant and wanting to mobilize but demonstrates decreased safety awareness, problem solving and initiation. Pt lives alone and states he is unsure if family could stay with him. Pt with 2 story home and bedroom upstairs. Pt with decreased strength, mobility, transfers, gait and pulmonary status who will benefit from acute therapy to maximize mobility, independence and safety to decrease burden of care. Pt with sats 96% on RA with drop to 90% while sitting with initiation of mobility O2 sats dropped to 86% with return to 2L O2 and sats 95%. HR 102-133 with gait.     Follow Up Recommendations SNF;Supervision/Assistance - 24 hour    Equipment Recommendations  Rolling walker with 5" wheels;3in1 (PT)    Recommendations for Other Services OT consult     Precautions / Restrictions Precautions Precautions: Fall Precaution Comments: watch sats, chest tube left      Mobility  Bed Mobility Overal bed mobility: Needs Assistance Bed Mobility: Supine to Sit     Supine to sit: Mod assist;HOB elevated     General bed mobility comments: mod assist to pivot to EOB with use of rail and assist to move trunk and lower body with sequential cues for how to perform and increased time  Transfers Overall transfer level: Needs assistance   Transfers: Sit to/from Stand Sit to Stand: Min assist;From elevated surface         General transfer comment: cues for hand placement, sequence and safety with assist for anterior translation and rise from elevated surface. Min assist to control descent to chair with cues to back fully  Ambulation/Gait Ambulation/Gait assistance: Min guard Ambulation Distance (Feet): 125 Feet Assistive device:  Rolling walker (2 wheeled) Gait Pattern/deviations: Step-through pattern;Decreased stride length;Trunk flexed   Gait velocity interpretation: Below normal speed for age/gender General Gait Details: cues for posture, position in RW breathing technique and RW use. pt with O2 on 3L with gait with inaccurate O2 readings and Hr 104-133  Stairs            Wheelchair Mobility    Modified Rankin (Stroke Patients Only)       Balance Overall balance assessment: Needs assistance   Sitting balance-Leahy Scale: Fair       Standing balance-Leahy Scale: Poor                               Pertinent Vitals/Pain Pain Assessment: 0-10 Pain Score: 4  Pain Location: chest Pain Descriptors / Indicators: Aching;Sore Pain Intervention(s): Limited activity within patient's tolerance;Monitored during session;Repositioned    Home Living Family/patient expects to be discharged to:: Private residence Living Arrangements: Alone Available Help at Discharge: Family;Available PRN/intermittently Type of Home: House Home Access: Stairs to enter   [employer] of Steps: 6 Home Layout: Two level;Bed/bath upstairs Home Equipment: Shower seat;Cane - single point      Prior Function Level of Independence: Independent               Hand Dominance        Extremity/Trunk Assessment   Upper Extremity Assessment: Generalized weakness           Lower Extremity Assessment: Generalized weakness      Cervical / Trunk Assessment:  Normal  Communication   Communication: No difficulties  Cognition Arousal/Alertness: Awake/alert Behavior During Therapy: Flat affect Overall Cognitive Status: Impaired/Different from baseline Area of Impairment: Safety/judgement;Problem solving         Safety/Judgement: Decreased awareness of deficits   Problem Solving: Decreased initiation      General Comments      Exercises        Assessment/Plan    PT Assessment  Patient needs continued PT services  PT Diagnosis Difficulty walking;Acute pain;Generalized weakness   PT Problem List Decreased strength;Decreased activity tolerance;Decreased balance;Decreased mobility;Pain;Decreased knowledge of use of DME;Cardiopulmonary status limiting activity  PT Treatment Interventions Gait training;Stair training;Functional mobility training;Therapeutic activities;Therapeutic exercise;Balance training;Patient/family education;DME instruction   PT Goals (Current goals can be found in the Care Plan section) Acute Rehab PT Goals Patient Stated Goal: be able to return home PT Goal Formulation: With patient Time For Goal Achievement: 01/10/16 Potential to Achieve Goals: Fair    Frequency Min 3X/week   Barriers to discharge Decreased caregiver support      Co-evaluation               End of Session Equipment Utilized During Treatment: Gait belt;Oxygen Activity Tolerance: Patient tolerated treatment well Patient left: in chair;with call bell/phone within reach;with chair alarm set;with nursing/sitter in room Nurse Communication: Mobility status;Precautions         Time: [id number] PT Time Calculation (min) (ACUTE ONLY): 33 min   Charges:   PT Evaluation $PT Eval Moderate Complexity: 1 Procedure PT Treatments $Gait Training: 8-22 mins   PT G CodesMelford Aase 12/27/2015, 2:34 PM Elwyn Reach, [place]

## 2015-12-27 NOTE — Progress Notes (Signed)
Met with pt to discuss discharge planning.  Pt states he lives alone, but plans to stay with his son in [place] at [employer].  Would recommend PT/OT consults when able to tolerate.  Will follow progress.     Reinaldo Raddle, RN, BSN  Trauma/Neuro ICU Case Manager [phone]

## 2015-12-27 NOTE — Progress Notes (Signed)
Patient ID: Curtis Dunn, male   DOB: 03-01-1941, 75 y.o.   MRN: [id number]    Subjective: Concerned about his insulin, breathing better  Objective: Vital signs in last 24 hours: Temp:  [97.6 F (36.4 C)-99.2 F (37.3 C)] 97.6 F (36.4 C) (02/09 0400) Pulse Rate:  [88-111] 88 (02/09 0733) Resp:  [10-27] 11 (02/09 0733) BP: (84-143)/(61-126) 143/126 mmHg (02/09 0733) SpO2:  [94 %-99 %] 99 % (02/09 0733) Weight:  [72.9 kg (160 lb 11.5 oz)] 72.9 kg (160 lb 11.5 oz) (02/09 0400) Last BM Date: 12/25/15  Intake/Output from previous day: 02/08 0701 - 02/09 0700 In: 1685 [P.O.:680; I.V.:1005] Out: 1675 [Urine:975; Chest Tube:700] Intake/Output this shift:    General appearance: alert and cooperative Resp: clear to auscultation bilaterally and decreased L base Chest wall: left sided chest wall tenderness Cardio: regular rate and rhythm GI: soft, NT Extremities: PAS, claves soft  Lab Results: CBC   Recent Labs  12/26/15 0345 12/27/15 0529  WBC 9.5 8.8  HGB 9.7* 9.8*  HCT 28.7* 29.1*  PLT 164 186   BMET  Recent Labs  12/26/15 0345 12/27/15 0529  NA 140 139  K 3.7 3.9  CL 95* 98*  CO2 35* 36*  GLUCOSE 197* 164*  BUN 61* 30*  CREATININE 1.79* 1.10  CALCIUM 9.0 8.5*   PT/INR  Recent Labs  12/25/15 1726 12/26/15 0345  LABPROT 20.5* 17.6*  INR 1.76* 1.44   Anti-infectives: Anti-infectives    None      Assessment/Plan: Fall L rib FX 3-9, 11 with HPTX - CT to H2O seal today, pulm toilet IDDM - SSI PAF - resume Xarelto tomorrow if Hb stable ABL anemia AKI - resolved, decrease IVF VTE - PAS Dispo - to floor, PT/OT    LOS: 2 days    Georganna Skeans, MD, MPH, FACS Trauma: [phone] General Surgery: [phone]  12/27/2015

## 2015-12-27 NOTE — Progress Notes (Signed)
C/o insulin is not delivered on time " i take my insulin and eat immediately, here in the hospital i have to wait for a long time to get my insulin", Review chart, blood sugar better controlled, continue close monitor blood sugar, titrate insulin as needed.

## 2015-12-28 ENCOUNTER — Inpatient Hospital Stay (HOSPITAL_COMMUNITY): Payer: Medicare Other

## 2015-12-28 LAB — CBC
HCT: 28.5 % — ABNORMAL LOW (ref 39.0–52.0)
Hemoglobin: 9.3 g/dL — ABNORMAL LOW (ref 13.0–17.0)
MCH: 31.5 pg (ref 26.0–34.0)
MCHC: 32.6 g/dL (ref 30.0–36.0)
MCV: 96.6 fL (ref 78.0–100.0)
Platelets: 201 10*3/uL (ref 150–400)
RBC: 2.95 MIL/uL — ABNORMAL LOW (ref 4.22–5.81)
RDW: 12.7 % (ref 11.5–15.5)
WBC: 8.3 10*3/uL (ref 4.0–10.5)

## 2015-12-28 LAB — BASIC METABOLIC PANEL
Anion gap: 3 — ABNORMAL LOW (ref 5–15)
BUN: 23 mg/dL — ABNORMAL HIGH (ref 6–20)
CO2: 36 mmol/L — ABNORMAL HIGH (ref 22–32)
Calcium: 8.9 mg/dL (ref 8.9–10.3)
Chloride: 101 mmol/L (ref 101–111)
Creatinine, Ser: 1.07 mg/dL (ref 0.61–1.24)
GFR calc Af Amer: >60 mL/min (ref >60)
GFR calc non Af Amer: >60 mL/min (ref >60)
Glucose, Bld: 177 mg/dL — ABNORMAL HIGH (ref 65–99)
Potassium: 4.7 mmol/L (ref 3.5–5.1)
Sodium: 140 mmol/L (ref 135–145)

## 2015-12-28 LAB — GLUCOSE, CAPILLARY
Glucose-Capillary: 159 mg/dL — ABNORMAL HIGH (ref 65–99)
Glucose-Capillary: 234 mg/dL — ABNORMAL HIGH (ref 65–99)
Glucose-Capillary: 291 mg/dL — ABNORMAL HIGH (ref 65–99)

## 2015-12-28 MED ORDER — TRAMADOL HCL 50 MG PO TABS
100.0000 mg | ORAL_TABLET | Freq: Four times a day (QID) | ORAL | Status: DC
  Administered 2015-12-28 – 2015-12-30 (×10): 100 mg via ORAL
  Filled 2015-12-28 (×10): qty 2

## 2015-12-28 MED ORDER — RIVAROXABAN 20 MG PO TABS
20.0000 mg | ORAL_TABLET | Freq: Every day | ORAL | Status: DC
  Administered 2015-12-29 – 2015-12-30 (×2): 20 mg via ORAL
  Filled 2015-12-28 (×2): qty 1

## 2015-12-28 MED ORDER — RIVAROXABAN 20 MG PO TABS
20.0000 mg | ORAL_TABLET | Freq: Every day | ORAL | Status: DC
  Administered 2015-12-28: 20 mg via ORAL
  Filled 2015-12-28: qty 1

## 2015-12-28 MED ORDER — NAPROXEN 250 MG PO TABS
500.0000 mg | ORAL_TABLET | Freq: Two times a day (BID) | ORAL | Status: DC
  Administered 2015-12-28 – 2015-12-30 (×5): 500 mg via ORAL
  Filled 2015-12-28 (×5): qty 2

## 2015-12-28 MED ORDER — INSULIN ASPART 100 UNIT/ML ~~LOC~~ SOLN
4.0000 [IU] | Freq: Three times a day (TID) | SUBCUTANEOUS | Status: DC
  Administered 2015-12-28 – 2015-12-30 (×6): 4 [IU] via SUBCUTANEOUS

## 2015-12-28 MED ORDER — DOCUSATE SODIUM 100 MG PO CAPS: 100.0000 mg | ORAL_CAPSULE | Freq: Two times a day (BID) | ORAL | Status: DC

## 2015-12-28 MED ORDER — INSULIN DETEMIR 100 UNIT/ML ~~LOC~~ SOLN
15.0000 [IU] | Freq: Two times a day (BID) | SUBCUTANEOUS | Status: DC
  Administered 2015-12-28 – 2015-12-30 (×4): 15 [IU] via SUBCUTANEOUS
  Filled 2015-12-28 (×6): qty 0.15

## 2015-12-28 MED ORDER — HYDROCODONE-ACETAMINOPHEN 10-325 MG PO TABS
0.5000 | ORAL_TABLET | ORAL | Status: DC | PRN
  Administered 2015-12-28: 2 via ORAL
  Administered 2015-12-28: 1 via ORAL
  Administered 2015-12-30: 2 via ORAL
  Filled 2015-12-28: qty 2
  Filled 2015-12-28: qty 1
  Filled 2015-12-28: qty 2

## 2015-12-28 MED ORDER — HYDROMORPHONE HCL 1 MG/ML IJ SOLN: 0.5000 mg | INTRAMUSCULAR | Status: DC | PRN

## 2015-12-28 NOTE — Progress Notes (Signed)
Patient seen and examined, reported feeling better, chest tube removed. xarelto resumed by surgery. Am blood sugar well controlled, add scheduled meal coverage insulin. Stable to discharge from medical stand point, can discharge home with home insulin regimen: lantus 15units bid and humalog (ssi and mc) as previously taking prior to this admission.

## 2015-12-28 NOTE — Progress Notes (Signed)
Inpatient Diabetes Program Recommendations  AACE/ADA: New Consensus Statement on Inpatient Glycemic Control (2015)  Target Ranges:  Prepandial:   less than 140 mg/dL      Peak postprandial:   less than 180 mg/dL (1-2 hours)      Critically ill patients:  140 - 180 mg/dL   Review of Glycemic Control  Diabetes history: DM (1?) Outpatient Diabetes medications: Lantus 15 units bid and Humalog (SSI and MC) of 12 units for breakfast, 10 units for lunch, and 12 units for dinner. Current orders for Inpatient glycemic control: levemir 15 units bid and  Moderate correction tidwc.  Inpatient Diabetes Program Recommendations:   Levemir controlling well with 15 units bid. Glucose high following meals with using correction scale only. Recommend adding 4 units tidwc in addition to correction.  Thank you Rosita Kea, RN, MSN, CDE  Diabetes Inpatient Program Office: [phone] Pager: [phone] 8:00 am to 5:00 pm

## 2015-12-28 NOTE — Care Management Important Message (Signed)
Important Message  Patient Details  Name: Curtis Dunn MRN: [id number] Date of Birth: 10-May-1941   Medicare Important Message Given:  Yes    Shatika Grinnell P Jaylin Roundy 12/28/2015, 3:11 PM

## 2015-12-28 NOTE — Progress Notes (Signed)
OT Cancellation Note  Patient Details Name: Curtis Dunn MRN: [id number] DOB: Mar 03, 1941   Cancelled Treatment:    Reason Eval/Treat Not Completed: adamantly refusing until CT is out.  Will reattempt later as schedule permits.   Darlina Rumpf Hardy, OTR/L [id number]  12/28/2015, 10:29 AM

## 2015-12-28 NOTE — Progress Notes (Signed)
Patient ID: Curtis Dunn, male   DOB: 1941-05-12, 75 y.o.   MRN: [id number]   LOS: 3 days   Subjective: No new c/o. Seems a little off, may just be personality.   Objective: Vital signs in last 24 hours: Temp:  [97.6 F (36.4 C)-98.8 F (37.1 C)] 98.4 F (36.9 C) (02/10 0546) Pulse Rate:  [71-107] 71 (02/10 0546) Resp:  [17-25] 17 (02/10 0546) BP: (108-130)/(58-82) 124/61 mmHg (02/10 0546) SpO2:  [94 %-100 %] 100 % (02/10 0546) Weight:  [74.4 kg (164 lb 0.4 oz)] 74.4 kg (164 lb 0.4 oz) (02/09 2055) Last BM Date: 12/25/15   IS: 1020ml   CT No air leak 69ml/24h   Laboratory  CBC  Recent Labs  12/27/15 0529 12/28/15 0430  WBC 8.8 8.3  HGB 9.8* 9.3*  HCT 29.1* 28.5*  PLT 186 201   BMET  Recent Labs  12/27/15 0529 12/28/15 0430  NA 139 140  K 3.9 4.7  CL 98* 101  CO2 36* 36*  GLUCOSE 164* 177*  BUN 30* 23*  CREATININE 1.10 1.07  CALCIUM 8.5* 8.9   CBG (last 3)   Recent Labs  12/27/15 1704 12/27/15 2104 12/28/15 0720  GLUCAP 223* 193* 159*    Radiology Results PORTABLE CHEST 1 VIEW  COMPARISON: 12/27/2015.  FINDINGS: Left chest tube in stable position. No pneumothorax. Mediastinum hilar structures are normal. Stable cardiomegaly. Left lower lobe dense atelectasis and/or infiltrate . Small left pleural effusion. No duct pneumothorax. Multiple left rib fractures again noted.  IMPRESSION: 1. Left chest tube in stable position. No definite pneumothorax. Multiple left rib fractures again noted.  2. Dense left lower lobe atelectasis and/or infiltrate again noted. Small left pleural effusion again noted. No interim change.  3. Stable cardiomegaly.   Electronically Signed  By: Marcello Moores Register  On: 12/28/2015 07:40   Physical Exam General appearance: alert and no distress Resp: clear to auscultation bilaterally Cardio: regular rate and rhythm GI: normal findings: bowel sounds normal and soft,  non-tender   Assessment/Plan: Fall L rib FX 3-9, 11 with HPTX - Pulm toilet, D/C CT IDDM - SSI PAF - resume Xarelto  ABL anemia -- Stable FEN -- Encourage orals for pain, add scheduled tramadol and NSAID to reduce narcotic burden VTE - SCD's Dispo - PT/OT recommended SNF vs home with 24/7 assist, will see what family can provide.    Lisette Abu, PA-C Pager: [phone] General Trauma PA Pager: [phone]  12/28/2015

## 2015-12-28 NOTE — Evaluation (Signed)
Occupational Therapy Evaluation Patient Details Name: Curtis Dunn MRN: [id number] DOB: Feb 19, 1941 Today's Date: 12/28/2015    History of Present Illness Pt admitted after fall with left rib fx 3-8 HPTX. PMHx: DM,AFib, HTN, colon CA   Clinical Impression   Pt admitted with above. He demonstrates the below listed deficits and will benefit from continued OT to maximize safety and independence with BADLs.  Pt presents with increased pain and generalized weakness, as well as poor safety awareness and judgement.   Currently, pt requires mod - max A for ADLs and min guard to min A for functional mobility.  Pt very resistant to recommendations for 24 hour assistance, and 3in1 commode, however, pt unable to complete tasks asked of him, and he finally conceded that the above is necessary.  With pt's permission, spoke with son's fiancee'.  She reports pt will not agree to a SNF, and their plan is to hire 24 hour assist, and they will move a bed downstairs.   Will continue to follow.      Follow Up Recommendations  Home health OT; [facility]; Supervision/Assistance - 24 hour    Equipment Recommendations  3 in 1 bedside comode    Recommendations for Other Services       Precautions / Restrictions Precautions Precautions: Fall Restrictions Weight Bearing Restrictions: No      Mobility Bed Mobility Overal bed mobility: Needs Assistance Bed Mobility: Rolling;Sidelying to Sit Rolling: Mod assist Sidelying to sit: Mod assist       General bed mobility comments: Pt requires max cues for sequencing and technique.  Requires assist to roll and lift trunk from bed due to pain.  He is unable to generalize current set up to home set up    Transfers Overall transfer level: Needs assistance Equipment used: Rolling walker (2 wheeled) Transfers: Sit to/from [employer] Sit to Stand: Min assist Stand pivot transfers: Min guard       General transfer comment: Pt demonstrates  poor safety awareness     Balance Overall balance assessment: Needs assistance Sitting-balance support: Feet supported Sitting balance-Leahy Scale: Fair     Standing balance support: Bilateral upper extremity supported Standing balance-Leahy Scale: Poor                              ADL Overall ADL's : Needs assistance/impaired Eating/Feeding: Independent   Grooming: Wash/dry hands;Wash/dry face;Oral care;Brushing hair;Minimal assistance;Sitting   Upper Body Bathing: Minimal assitance;Sitting   Lower Body Bathing: Maximal assistance;Sit to/from stand   Upper Body Dressing : Minimal assistance;Sitting   Lower Body Dressing: Maximal assistance;Sit to/from stand   Toilet Transfer: Minimal assistance;Ambulation;Comfort height toilet;RW Armed forces technical officer Details (indicate cue type and reason): Pt demonstrates decreased safety awareness  Toileting- Clothing Manipulation and Hygiene: Moderate assistance;Sit to/from stand       Functional mobility during ADLs: Min guard;Rolling walker General ADL Comments: Pt fatigues quite rapidly.  He is limited by pain.  He demonstrates poor safety awareness and is unable to generalize how deficits will impact him functionally.       Vision     Perception     Praxis      Pertinent Vitals/Pain Pain Assessment: 0-10 Pain Score: 8  Pain Location: Lt ribs  Pain Descriptors / Indicators: Aching;Stabbing;Sharp;Grimacing;Constant;Throbbing Pain Intervention(s): Monitored during session;Limited activity within patient's tolerance;Repositioned     Hand Dominance Right   Extremity/Trunk Assessment Upper Extremity Assessment Upper Extremity Assessment: LUE deficits/detail LUE Deficits /  Details: Limited AROM against gravity due to rib pain    Lower Extremity Assessment Lower Extremity Assessment: Defer to PT evaluation   Cervical / Trunk Assessment Cervical / Trunk Assessment: Other exceptions (movement limited by pain )    Communication Communication Communication: No difficulties   Cognition Arousal/Alertness: Awake/alert Behavior During Therapy: Flat affect Overall Cognitive Status: Impaired/Different from baseline Area of Impairment: Safety/judgement;Problem solving         Safety/Judgement: Decreased awareness of deficits   Problem Solving: Decreased initiation;Difficulty sequencing;Requires verbal cues;Requires tactile cues General Comments: Spoke with Amy, son's fiancee', and these behaviors may be long standing and pt's baseline    General Comments       Exercises       Shoulder Instructions      Home Living Family/patient expects to be discharged to:: Private residence Living Arrangements: Alone Available Help at Discharge: Family;Available PRN/intermittently Type of Home: House Home Access: Stairs to enter [employer] of Steps: 6 Entrance Stairs-Rails: Can reach both;Left;Right Home Layout: Two level;Bed/bath upstairs;Full bath on main level Alternate Level Stairs-Number of Steps: 14 Alternate Level Stairs-Rails: Can reach both Bathroom Shower/Tub: Tub/shower unit;Curtain (in downstairs bathroom ) Shower/tub characteristics: Curtain Biochemist, clinical: Handicapped height (upstairs.  Pt is uncertain about downstairs toilet)     Home Equipment: Shower seat;Cane - single point   Additional Comments: With pt's permission, spoke with son's fiancee' Amy.  Plan is to move a bed downstairs, and hire 24 hour caregivers.        Prior Functioning/Environment Level of Independence: Independent             OT Diagnosis: Generalized weakness;Acute pain   OT Problem List: Decreased strength;Decreased activity tolerance;Impaired balance (sitting and/or standing);Decreased safety awareness;Decreased knowledge of use of DME or AE;Cardiopulmonary status limiting activity;Pain   OT Treatment/Interventions: Self-care/ADL training;Therapeutic exercise;DME and/or AE  instruction;Therapeutic activities;Patient/family education;Balance training    OT Goals(Current goals can be found in the care plan section) Acute Rehab OT Goals Patient Stated Goal: be able to return home OT Goal Formulation: With patient Time For Goal Achievement: 01/11/16 Potential to Achieve Goals: Good ADL Goals Pt Will Perform Grooming: with supervision;standing Pt Will Perform Upper Body Bathing: with supervision;sitting Pt Will Perform Lower Body Bathing: with supervision;with adaptive equipment;sit to/from stand Pt Will Perform Upper Body Dressing: with supervision;with adaptive equipment;sitting Pt Will Perform Lower Body Dressing: with supervision;sit to/from stand Pt Will Transfer to Toilet: with supervision;ambulating;regular height toilet;bedside commode;grab bars Pt Will Perform Toileting - Clothing Manipulation and hygiene: with supervision;sit to/from stand  OT Frequency: Min 2X/week   Barriers to D/C:    will hire 24 hour assist        Co-evaluation              End of Session Equipment Utilized During Treatment: Surveyor, mining Communication: Mobility status  Activity Tolerance: No increased pain;Patient limited by fatigue Patient left: in chair;with call bell/phone within reach   Time: [id number] OT Time Calculation (min): 46 min Charges:  OT General Charges $OT Visit: 1 Procedure OT Evaluation $OT Eval Moderate Complexity: 1 Procedure OT Treatments $Self Care/Home Management : 23-37 mins G-Codes:    Zoeie Ritter M 2016/01/19, 12:02 PM

## 2015-12-29 ENCOUNTER — Inpatient Hospital Stay (HOSPITAL_COMMUNITY): Payer: Medicare Other

## 2015-12-29 DIAGNOSIS — S2243XA Multiple fractures of ribs, bilateral, initial encounter for closed fracture: Secondary | ICD-10-CM | POA: Diagnosis present

## 2015-12-29 LAB — GLUCOSE, CAPILLARY
Glucose-Capillary: 161 mg/dL — ABNORMAL HIGH (ref 65–99)
Glucose-Capillary: 68 mg/dL (ref 65–99)
Glucose-Capillary: 87 mg/dL (ref 65–99)
Glucose-Capillary: 216 mg/dL — ABNORMAL HIGH (ref 65–99)
Glucose-Capillary: 143 mg/dL — ABNORMAL HIGH (ref 65–99)
Glucose-Capillary: 203 mg/dL — ABNORMAL HIGH (ref 65–99)

## 2015-12-29 MED ORDER — TRAMADOL HCL 50 MG PO TABS: 50.0000 mg | ORAL_TABLET | Freq: Four times a day (QID) | ORAL | Status: DC | PRN

## 2015-12-29 MED ORDER — SENNOSIDES-DOCUSATE SODIUM 8.6-50 MG PO TABS: 2.0000 | ORAL_TABLET | Freq: Two times a day (BID) | ORAL | Status: DC

## 2015-12-29 MED ORDER — POLYETHYLENE GLYCOL 3350 17 G PO PACK: 17.0000 g | PACK | Freq: Every day | ORAL | Status: DC | PRN

## 2015-12-29 MED ORDER — HYDROCODONE-ACETAMINOPHEN 10-325 MG PO TABS: 0.5000 | ORAL_TABLET | Freq: Four times a day (QID) | ORAL | Status: DC | PRN

## 2015-12-29 NOTE — Discharge Instructions (Signed)
Pneumothorax °A pneumothorax, commonly called a collapsed lung, is a condition in which air leaks from a lung and builds up in the space between the lung and the chest wall (pleural space). The air in a pneumothorax is trapped outside the lung and takes up space, preventing the lung from fully expanding. This is a condition that usually occurs suddenly. The buildup of air may be small or large. A small pneumothorax may go away on its own. When a pneumothorax is larger, it will often require medical treatment and hospitalization.  °CAUSES  °A pneumothorax can sometimes happen quickly with no apparent cause. People with underlying lung problems, particularly COPD or emphysema, are at higher risk of pneumothorax. However, pneumothorax can happen quickly even in people with no prior known lung problems. Trauma, surgery, medical procedures, or injury to the chest wall can also cause a pneumothorax. °SIGNS AND SYMPTOMS  °Sometimes a pneumothorax will have no symptoms. When symptoms are present, they can include: °· Chest pain. °· Shortness of breath. °· Increased rate of breathing. °· Bluish color to your lips or skin (cyanosis). °DIAGNOSIS  °Pneumothorax is usually diagnosed by a chest X-ray or chest CT scan. Your health care provider will also take a medical history and perform a physical exam to determine why you may have a pneumothorax. °TREATMENT  °A small pneumothorax may go away on its own without treatment. Extra oxygen can sometimes help a small pneumothorax go away more quickly. For a larger pneumothorax or a pneumothorax that is causing symptoms, a procedure is usually needed to drain the air. In some cases, the health care provider may drain the air using a needle. In other cases, a chest tube may be inserted into the pleural space. A chest tube is a small tube placed between the ribs and into the pleural space. This removes the extra air and allows the lung to expand back to its normal size. A large  pneumothorax will usually require a hospital stay. If there is ongoing air leakage into the pleural space, then the chest tube may need to remain in place for several days until the air leak has healed. In some cases, surgery may be needed.  °HOME CARE INSTRUCTIONS  °· Only take over-the-counter or prescription medicines as directed by your health care provider. °· If a cough or pain makes it difficult for you to sleep at night, try sleeping in a semi-upright position in a recliner or by using 2 or 3 pillows. °· Rest and limit activity as directed by your health care provider. °· If you had a chest tube and it was removed, ask your health care provider when it is okay to remove the dressing. Until your health care provider says you can remove the dressing, do not allow it to get wet. °· Do not smoke. Smoking is a risk factor for pneumothorax. °· Do not fly in an airplane or scuba dive until your health care provider says it is okay. °· Follow up with your health care provider as directed. °SEEK IMMEDIATE MEDICAL CARE IF:  °· You have increasing chest pain or shortness of breath. °· You have a cough that is not controlled with suppressants. °· You begin coughing up blood. °· You have pain that is getting worse or is not controlled with medicines. °· You cough up thick, discolored mucus (sputum) that is yellow to green in color. °· You have redness, increasing pain, or discharge at the site where a chest tube had been in place (if   your pneumothorax was treated with a chest tube). °· The site where your chest tube was located opens up. °· You feel air coming out of the site where the chest tube was placed. °· You have a fever or persistent symptoms for more than 2-3 days. °· You have a fever and your symptoms suddenly get worse. °MAKE SURE YOU:  °· Understand these instructions. °· Will watch your condition. °· Will get help right away if you are not doing well or get worse. °  °This information is not intended to  replace advice given to you by your health care provider. Make sure you discuss any questions you have with your health care provider. °  °Document Released: 11/03/2005 Document Revised: 08/24/2013 Document Reviewed: 06/02/2013 °Elsevier Interactive Patient Education ©2016 [employer] Inc. ° °

## 2015-12-29 NOTE — Care Management (Signed)
Case manager contacted patient's son- Caryl Pina Joffe-[phone], to confirm that he was getting patient's rollator and 3in1. He states that he would be getting them from [facility].

## 2015-12-29 NOTE — Discharge Summary (Signed)
[facility] Discharge Summary   Patient ID: Curtis Dunn MRN: [id number] DOB/AGE: October 03, 1941 75 y.o.  Admit date: 12/25/2015 Discharge date: 12/29/2015  Admitting Diagnosis: Left flail chest Bilateral rib fractures Hemopneumothorax on left LOC AFIB on xarelto DM HTN Hypochloremia Acute kidney injury Hyponatremia Hypovolemia Acute blood loss anemia  Discharge Diagnosis Patient Active Problem List   Diagnosis Date Noted  . Multiple fractures of ribs of both sides 12/29/2015  . Flail chest 12/25/2015  . AKI (acute kidney injury) ([place]) 12/25/2015  . Hemopneumothorax on left 12/25/2015  . IDDM (insulin dependent diabetes mellitus) ([place]) 12/25/2015  . History of colon cancer 08/21/2014  . Acute blood loss anemia 02/11/2014  . Fracture dislocation of cervical spine ([place]) 02/10/2014  . C1 cervical fracture ([place]) 02/10/2014  . Fall 02/10/2014  . Distal radius fracture, right 02/10/2014  . Concussion 02/10/2014  . Facial laceration 02/10/2014  . Anticoagulated 02/10/2014  . PAF (paroxysmal atrial fibrillation) ([place]) 09/08/2012  . Palpitations 03/05/2012  . MALIGNANT NEOPLASM OF CECUM 12/11/2007  . COLONIC POLYPS 12/11/2007  . DIABETES MELLITUS, TYPE I 12/11/2007  . Essential hypertension 12/11/2007    Consultants Internal Medicine - Dr. Jennette Kettle, Dr. Florencia Reasons  Imaging: Dg Chest Port 1 View  12/29/2015  CLINICAL DATA:  Recent trauma with parenchymal hemorrhage EXAM: PORTABLE CHEST 1 VIEW COMPARISON:  December 28, 2015 FINDINGS: The left chest tube has been removed. No pneumothorax is appreciable. There remains airspace consolidation on the left with left effusion. Right lung is clear except for mild scarring in the medial right base. There is mild cardiomegaly with pulmonary vascularity within normal limits. No adenopathy. Multiple rib fractures are noted bilaterally, unchanged. Several rib fractures on the left superiorly are displaced, stable. There  is advanced arthropathy in the right shoulder. IMPRESSION: Left chest tube removed without demonstrable pneumothorax. Persistent left lower lobe consolidation with left effusion. Mild medial right base atelectasis. No new parenchymal lung opacity. No change in cardiac silhouette. Multiple rib fractures bilaterally, more pronounced on the left than on the right with multiple displaced fractures on the left superiorly, unchanged. Electronically Signed   By: Lowella Grip III M.D.   On: 12/29/2015 08:53   Dg Chest Port 1 View  12/28/2015  CLINICAL DATA:  Left pneumothorax. EXAM: PORTABLE CHEST 1 VIEW COMPARISON:  12/27/2015. FINDINGS: Left chest tube in stable position. No pneumothorax. Mediastinum hilar structures are normal. Stable cardiomegaly. Left lower lobe dense atelectasis and/or infiltrate . Small left pleural effusion. No duct pneumothorax. Multiple left rib fractures again noted. IMPRESSION: 1. Left chest tube in stable position. No definite pneumothorax. Multiple left rib fractures again noted. 2. Dense left lower lobe atelectasis and/or infiltrate again noted. Small left pleural effusion again noted. No interim change. 3.  Stable cardiomegaly. Electronically Signed   By: Marcello Moores  Register   On: 12/28/2015 07:40    Procedures Left chest tube placed by [facility] Course:  75 year old male with PMH DM, HTN, AFIB on xarelto who fell 2 days ago off a ladder. He states that he's been having trouble with his blood sugar. Been high despite giving insulin with meals and Lantus twice a day. He does not recall the fall. He does state that he developed chest pain. This continued to worsen and he also got very short of breath. He came to the emergency department today from home by private vehicle. He has fallen other times this week. One of those times he did hit his head. He denies nausea or  vomiting. He denies back pain. He has some mild left ankle soreness. He complains that his A1C has been way  "out of whack."   Workup showed fall from ladder, LOC, bilateral rib fractures, left flail chest ribs 3-11, left hemopneumothorax, ?T5 fx.  Patient was admitted and was transferred to the ICU for close monitoring.  He was found to have hypochloremia, hyponatremia, acute kidney injury, abl anemia.  Medicine was consulted to help with his chronic medical problems.  He was resumed on his home medication regimen.  Diet was advanced as tolerated.  Left chest tube was discontinued on 12/28/15 and repeat CXR showed no pneumothorax, but persistent stable left lower lobe consolidation with left effusion, which should resolve with time.  Oxygen supplementation was weaned to off.  IS up to 1500.  On HD #5, the patient was voiding well, tolerating diet, ambulating well, pain well controlled, vital signs stable, and felt stable for discharge home.  Patient will follow up in our trauma office in 1.5 weeks and knows to call with questions or concerns.  He should follow up with his PCP upon discharge.  Physical Exam: General: pleasant, WD/WN white male who is laying in bed in NAD HEENT: head is normocephalic.  Sclera are noninjected.  PERRL.  Ears and nose without any masses or lesions.  Mouth is pink and moist Heart: regular, rate, and rhythm.  Normal s1,s2. No obvious murmurs, gallops, or rubs noted.  Palpable radial and pedal pulses bilaterally Lungs: CTA except dull in LLL, no wheezes, rhonchi, or rales noted.  Respiratory effort nonlabored, good effort, IS up to 1500 Abd: soft, NT/ND, +BS, no masses, hernias, or organomegaly MS: all 4 extremities are symmetrical with no cyanosis, clubbing, or edema. Skin: warm and dry with no masses, lesions, or rashes Psych: A&Ox3 with an appropriate affect.      Medication List    TAKE these medications        amLODipine 10 MG tablet  Commonly known as:  NORVASC  Take 10 mg by mouth daily.     Chelated Magnesium 100 MG Tabs  Take 1 tablet by mouth daily.     DHA  OMEGA 3 PO  Take 1,200 mg by mouth daily.     furosemide 40 MG tablet  Commonly known as:  LASIX  Take 20-40 mg by mouth daily. Alternate 20 & 40 mg dose     hydrochlorothiazide 25 MG tablet  Commonly known as:  HYDRODIURIL  Take 25 mg by mouth daily.     HYDROcodone-acetaminophen 10-325 MG tablet  Commonly known as:  NORCO  Take 0.5-2 tablets by mouth every 6 (six) hours as needed (1/2 tablet for mild pain, 1 tablet for moderate pain, 2 tablets for severe pain).     insulin glargine 100 UNIT/ML injection  Commonly known as:  LANTUS  Inject 15 Units into the skin 2 (two) times daily.     insulin lispro 100 UNIT/ML injection  Commonly known as:  HUMALOG  Inject 4-16 Units into the skin 3 (three) times daily before meals. Taking 12 units  Am; 10 UNITS  noon and 12 UNITS units at  6 pm     lovastatin 20 MG tablet  Commonly known as:  MEVACOR  Take 20 mg by mouth at bedtime.     metoprolol succinate 25 MG 24 hr tablet  Commonly known as:  TOPROL XL  Take 1 tablet (25 mg total) by mouth daily.     polyethylene glycol packet  Commonly known as:  MIRALAX / GLYCOLAX  Take 17 g by mouth daily as needed.     Potassium 99 MG Tabs  Take 1 tablet by mouth daily.     ramipril 10 MG tablet  Commonly known as:  ALTACE  Take 10 mg by mouth daily.     rivaroxaban 20 MG Tabs tablet  Commonly known as:  XARELTO  Take 1 tablet (20 mg total) by mouth daily.     senna-docusate 8.6-50 MG tablet  Commonly known as:  Senokot-S  Take 2 tablets by mouth 2 (two) times daily.     tamsulosin 0.4 MG Caps capsule  Commonly known as:  FLOMAX  Take 0.4 mg by mouth daily.     traMADol 50 MG tablet  Commonly known as:  ULTRAM  Take 1-2 tablets (50-100 mg total) by mouth every 6 (six) hours as needed (mild to moderate pain).     VITAMIN B 12 PO  Take 1 tablet by mouth daily. Taking 1000 daily     vitamin C 1000 MG tablet  Take 1,000 mg by mouth daily.     vitamin E 400 UNIT capsule  Take  400 Units by mouth daily.     Zinc 25 MG Tabs  Take 1 tablet by mouth daily.     zolpidem 10 MG tablet  Commonly known as:  AMBIEN  Take 10 mg by mouth at bedtime as needed (sleep).         Follow-up Information    Follow up with [facility] on 01/09/2016.   Why:  For post-hospital follow up at 2:15pm, please arrive by 1:45pm to check in and fill out paperwork.  This is a recheck after your chest tube placement.   Contact information:   [place] [phone]      Follow up with [name], MD. Schedule an appointment as soon as possible for a visit in 1 week.   Specialty:  Endocrinology   Why:  For post-hospital follow up as soon as possible   Contact information:   [address] [place] [address] [phone]       Signed: Nat Christen, [facility] [phone]  12/29/2015, 9:16 AM

## 2015-12-30 LAB — GLUCOSE, CAPILLARY
Glucose-Capillary: 105 mg/dL — ABNORMAL HIGH (ref 65–99)
Glucose-Capillary: 223 mg/dL — ABNORMAL HIGH (ref 65–99)
Glucose-Capillary: 214 mg/dL — ABNORMAL HIGH (ref 65–99)

## 2015-12-30 NOTE — Progress Notes (Signed)
Discharge cancelled per Dr. Brantley Stage. Pt's family unable to arrange 24 hr care and equipment today.

## 2015-12-30 NOTE — Progress Notes (Signed)
Occupational Therapy Treatment Patient Details Name: Curtis Dunn MRN: [id number] DOB: [birth year] Today's Date: 12/30/2015    History of present illness Pt admitted after fall with left rib fx 3-8 HPTX. PMHx: DM,AFib, HTN, colon CA   OT comments  Pt progressing towards acute OT goals. Focus of session was reinforcement of ADL education, and practicing simulated toilet transfer. Pt requesting to walk; completed household distance functional mobility at min guard level. Decreased safety awareness still an issue. OT to continue to follow acutely.    Follow Up Recommendations  Home health OT;Supervision/Assistance - 24 hour    Equipment Recommendations  3 in 1 bedside comode    Recommendations for Other Services      Precautions / Restrictions Precautions Precautions: Fall Restrictions Weight Bearing Restrictions: No       Mobility Bed Mobility Overal bed mobility: Needs Assistance Bed Mobility: Supine to Sit     Supine to sit: Min guard;HOB elevated        Transfers Overall transfer level: Needs assistance Equipment used: Rolling walker (2 wheeled) Transfers: Sit to/from Stand Sit to Stand: Min guard         General transfer comment: After educating on hand placement during sit<>stand, pt with both hands on rw to stand. Blocked rw for safety.     Balance Overall balance assessment: Needs assistance         Standing balance support: Bilateral upper extremity supported Standing balance-Leahy Scale: Poor                     ADL Overall ADL's : Needs assistance/impaired                         Toilet Transfer: Min guard           Functional mobility during ADLs: Min guard;Rolling walker General ADL Comments: Discuss goal of session to practice toilet transfer. Pt ambulated to bathroom but did not complete transfer, instead pt turned around and walked out, ignoring (not attending to?) cue to sit on toilet. Pt requesting to walk in  the hallway.  Ambulated household distance at min guard level. Completed transfer to recliner. Pt with inconsistent command following (behavior vs cognition?, acute vs baseline?) Pt still demonstrating decreased safety awareness and insight into deficits. 24 hour care needed at d/c. Educated on LB dressing technique and bed mobility.      Vision                     Perception     Praxis      Cognition   Behavior During Therapy: [facility] for tasks assessed/performed Overall Cognitive Status: Impaired/Different from baseline Area of Impairment: Safety/judgement;Problem solving          Safety/Judgement: Decreased awareness of deficits;Decreased awareness of safety     General Comments:  behaviors may be long standing and pt's baseline per family on OT eval. Continues to exhibit decreased safety awareness.    Extremity/Trunk Assessment               Exercises     Shoulder Instructions       General Comments      Pertinent Vitals/ Pain       Pain Assessment: No/denies pain  Home Living  Prior Functioning/Environment              Frequency Min 2X/week     Progress Toward Goals  OT Goals(current goals can now be found in the care plan section)  Progress towards OT goals: Progressing toward goals  Acute Rehab OT Goals Patient Stated Goal: be able to return home OT Goal Formulation: With patient Time For Goal Achievement: 01/11/16 Potential to Achieve Goals: Good ADL Goals Pt Will Perform Grooming: with supervision;standing Pt Will Perform Upper Body Bathing: with supervision;sitting Pt Will Perform Lower Body Bathing: with supervision;with adaptive equipment;sit to/from stand Pt Will Perform Upper Body Dressing: with supervision;with adaptive equipment;sitting Pt Will Perform Lower Body Dressing: with supervision;sit to/from stand Pt Will Transfer to Toilet: with  supervision;ambulating;regular height toilet;bedside commode;grab bars Pt Will Perform Toileting - Clothing Manipulation and hygiene: with supervision;sit to/from stand  Plan Discharge plan remains appropriate    Co-evaluation                 End of Session Equipment Utilized During Treatment: Rolling walker   Activity Tolerance Patient tolerated treatment well   Patient Left in chair;with call bell/phone within reach;with family/visitor present   Nurse Communication          Time: [id number] OT Time Calculation (min): 21 min  Charges: OT General Charges $OT Visit: 1 Procedure OT Treatments $Self Care/Home Management : 8-22 mins  Hortencia Pilar 12/30/2015, 2:51 PM

## 2015-12-30 NOTE — Discharge Planning (Signed)
Patient discharged home with son in stable condition. Verbalizes understanding of all discharge instructions, including home medications and follow up appointments.

## 2015-12-30 NOTE — Progress Notes (Signed)
CM received call from RN stating family not going to take pt home tomorrow.  CM called Curtis Dunn who was frustrated but polite.  Curtis Dunn states he has many obligations to fulfill today but will be back to the hospital late this afternoon/evening for his father.  No other CM needs were communicated.

## 2015-12-30 NOTE — Progress Notes (Signed)
  Subjective: No complaints. Ready to go home  Objective: Vital signs in last 24 hours: Temp:  [98.5 F (36.9 C)] 98.5 F (36.9 C) (02/11 2157) Pulse Rate:  [69-128] 69 (02/11 2157) Resp:  [18-19] 19 (02/11 2157) BP: (109-117)/(60-65) 109/60 mmHg (02/11 2157) SpO2:  [95 %-96 %] 96 % (02/11 2157) Last BM Date: 12/29/15  Intake/Output from previous day: 02/11 0701 - 02/12 0700 In: 700 [P.O.:700] Out: 540 [Urine:540] Intake/Output this shift:    Resp: clear to auscultation bilaterally Cardio: regular rate and rhythm GI: soft, nontender  Lab Results:   Recent Labs  12/28/15 0430  WBC 8.3  HGB 9.3*  HCT 28.5*  PLT 201   BMET  Recent Labs  12/28/15 0430  NA 140  K 4.7  CL 101  CO2 36*  GLUCOSE 177*  BUN 23*  CREATININE 1.07  CALCIUM 8.9   PT/INR No results for input(s): LABPROT, INR in the last 72 hours. ABG No results for input(s): PHART, HCO3 in the last 72 hours.  Invalid input(s): PCO2, PO2  Studies/Results: Dg Chest Port 1 View  12/29/2015  CLINICAL DATA:  Recent trauma with parenchymal hemorrhage EXAM: PORTABLE CHEST 1 VIEW COMPARISON:  December 28, 2015 FINDINGS: The left chest tube has been removed. No pneumothorax is appreciable. There remains airspace consolidation on the left with left effusion. Right lung is clear except for mild scarring in the medial right base. There is mild cardiomegaly with pulmonary vascularity within normal limits. No adenopathy. Multiple rib fractures are noted bilaterally, unchanged. Several rib fractures on the left superiorly are displaced, stable. There is advanced arthropathy in the right shoulder. IMPRESSION: Left chest tube removed without demonstrable pneumothorax. Persistent left lower lobe consolidation with left effusion. Mild medial right base atelectasis. No new parenchymal lung opacity. No change in cardiac silhouette. Multiple rib fractures bilaterally, more pronounced on the left than on the right with multiple  displaced fractures on the left superiorly, unchanged. Electronically Signed   By: Lowella Grip III M.D.   On: 12/29/2015 08:53    Anti-infectives: Anti-infectives    None      Assessment/Plan: s/p * No surgery found * Discharge  LOS: 5 days    TOTH III,PAUL S 12/30/2015

## 2016-01-06 ENCOUNTER — Telehealth: Payer: Self-pay | Admitting: Radiology

## 2016-01-06 MED ORDER — AMOXICILLIN-POT CLAVULANATE 875-125 MG PO TABS: 1.0000 | ORAL_TABLET | Freq: Two times a day (BID) | ORAL | Status: DC

## 2016-01-06 NOTE — Telephone Encounter (Signed)
Sent in Augmentin per Dr Joseph Art, he called from home wanting this sent/ [place]

## 2016-01-10 ENCOUNTER — Other Ambulatory Visit: Payer: Self-pay | Admitting: Surgical

## 2016-02-10 ENCOUNTER — Other Ambulatory Visit: Payer: Self-pay | Admitting: Cardiology

## 2016-02-11 ENCOUNTER — Other Ambulatory Visit: Payer: Self-pay | Admitting: *Deleted

## 2016-02-11 DIAGNOSIS — I480 Paroxysmal atrial fibrillation: Secondary | ICD-10-CM

## 2016-02-11 MED ORDER — METOPROLOL SUCCINATE ER 25 MG PO TB24: 25.0000 mg | ORAL_TABLET | Freq: Every day | ORAL | Status: DC

## 2016-02-13 MED ORDER — METOPROLOL SUCCINATE ER 25 MG PO TB24: 25.0000 mg | ORAL_TABLET | Freq: Every day | ORAL | Status: DC

## 2016-02-13 NOTE — Addendum Note (Signed)
Addended by: Roberts Gaudy on: 02/13/2016 01:38 PM   Modules accepted: Orders

## 2016-02-15 ENCOUNTER — Ambulatory Visit: Payer: Self-pay | Admitting: Surgery

## 2016-02-15 NOTE — H&P (Signed)
  History of Present Illness Curtis Dunn. Curtis Malter MD; 02/15/2016 12:02 PM) Patient words: hernia.  The patient is a 75 year old male who presents with an inguinal hernia. PCP - [facility] - Marlou Porch [facility])  This is a 75 year old male with multiple medical issues who was recently discharged from the hospital after a left pneumothorax and rib fractures from a fall off of a letter. The patient also had a left ankle fracture. This is being treated nonoperatively. The patient has noted a 4 month history of some mild discomfort in his left groin. He denies any swelling in that area previously. However over the last 10 days this discomfort has become much worse. There swelling around this area. It improves when he is supine. The patient is anticoagulated with Xarelto for atrial fibrillation. He presents now for surgical evaluation.   Allergies (Ammie Eversole, LPN; 579FGE [id number] AM) No Known Drug Allergies 01/09/2016  Medication History (Ammie Eversole, LPN; 579FGE [id number] AM) AmLODIPine Besylate (10MG  Tablet, Oral) Active. Chelated Magnesium (100MG  Tablet, Oral) Active. Vitamin C (500MG  Capsule, Oral) Active. Omega 3 (1200MG  Capsule, Oral) Active. Vitamin B 12 (50MCG Tablet, Oral) Active. Lasix (40MG  Tablet, Oral) Active. HydroDiuril (25MG  Tablet, Oral) Active. Lantus (100UNIT/ML Solution, Subcutaneous) Active. HumaLOG (100UNIT/ML Solution, Subcutaneous) Active. Mevacor (20MG  Tablet, Oral) Active. Toprol XL (25MG  Tablet ER 24HR, Oral) Active. Altace (10MG  Capsule, Oral) Active. Potassium (99MG  Tablet, Oral) Active. Xarelto (20MG  Tablet, Oral) Active. Flomax (0.4MG  Capsule, Oral) Active. Vitamin E (400UNIT Capsule, Oral) Active. Zinc (25MG  Tablet, Oral) Active. Medications Reconciled    Vitals (Ammie Eversole LPN; 579FGE [id number] AM) 02/15/2016 10:10 AM Weight: 157.8 lb Height: 72in Body Surface Area: 1.93 m Body Mass Index: 21.4 kg/m   Temp.: 97.40F(Oral)  Pulse: 82 (Regular)  BP: 122/72 (Sitting, Left Arm, Standard)      Physical Exam Rodman Key K. Oliverio Cho MD; 02/15/2016 12:06 PM)  The physical exam findings are as follows: Note:WDWN in NAD HEENT: EOMI, sclera anicteric Neck: No masses, no thyromegaly Lungs: CTA bilaterally; normal respiratory effort CV: Regular rate and rhythm; no murmurs Abd: +bowel sounds, soft, non-tender, small protruding umbilical hernia - reducible GU: bilateral descended testes; no testicular masses; reducible left inguinal hernia; no sign of right inguinal hernia Ext: Well-perfused; no edema Skin: Warm, dry; no sign of jaundice    Assessment & Plan Rodman Key K. Toby Ayad MD; 02/15/2016 10:40 AM)  REDUCIBLE LEFT INGUINAL HERNIA (K40.90)  Current Plans Pt Education - Pamphlet Given - Hernia Surgery: discussed with patient and provided information. UMBILICAL HERNIA WITHOUT OBSTRUCTION AND WITHOUT GANGRENE (K42.9)  Current Plans Schedule for Surgery - Left inguinal hernia repair with mesh/ umbilical hernia repair. The surgical procedure has been discussed with the patient. Potential risks, benefits, alternative treatments, and expected outcomes have been explained. All of the patient's questions at this time have been answered. The likelihood of reaching the patient's treatment goal is good. The patient understand the proposed surgical procedure and wishes to proceed.  Curtis Dunn. [name], MD, [facility]  General/ Trauma Surgery  02/15/2016 12:06 PM

## 2016-02-18 ENCOUNTER — Telehealth: Payer: Self-pay | Admitting: Cardiology

## 2016-02-18 NOTE — Telephone Encounter (Signed)
Request for surgical clearance:  1. What type of surgery is being performed? unbilical repair                                          When is this surgery scheduled? unknown Are there any medications that need to be held prior to surgery and how long?  xelerto Name of physician performing surgery?Dr. [name] 2. What is your office phone and fax number?  [phone]

## 2016-02-18 NOTE — Telephone Encounter (Signed)
Pt has an appt on Thursday with Dr Marlou Porch for surgical clearance

## 2016-02-21 ENCOUNTER — Ambulatory Visit (INDEPENDENT_AMBULATORY_CARE_PROVIDER_SITE_OTHER): Payer: Medicare Other | Admitting: Cardiology

## 2016-02-21 ENCOUNTER — Encounter: Payer: Self-pay | Admitting: Cardiology

## 2016-02-21 VITALS — BP 124/64 | HR 68 | Ht 72.0 in | Wt 159.6 lb

## 2016-02-21 DIAGNOSIS — K4030 Unilateral inguinal hernia, with obstruction, without gangrene, not specified as recurrent: Secondary | ICD-10-CM

## 2016-02-21 DIAGNOSIS — I480 Paroxysmal atrial fibrillation: Secondary | ICD-10-CM

## 2016-02-21 DIAGNOSIS — I10 Essential (primary) hypertension: Secondary | ICD-10-CM

## 2016-02-21 DIAGNOSIS — Z01810 Encounter for preprocedural cardiovascular examination: Secondary | ICD-10-CM

## 2016-02-21 NOTE — Patient Instructions (Addendum)
Medication Instructions:  The current medical regimen is effective;  continue present plan and medications.  Follow-Up: Follow up in 6 months with Dr. Marlou Porch.  You will receive a letter in the mail 2 months before you are due.  Please call us when you receive this letter to schedule your follow up appointment.  You have been cleared for hernia surgery from a cardiac standpoint.  You may hold your Xarelto 2 days prior to your surgery.  If you need a refill on your cardiac medications before your next appointment, please call your pharmacy.  Thank you for choosing [facility]!!

## 2016-02-21 NOTE — Telephone Encounter (Signed)
Follow up      Pt has an appt this afternoon.  Calling to let Dr Marlou Porch know that he had 7 EKG's taken in feb of this year. He may want to look at these prior to his appt.

## 2016-02-21 NOTE — Telephone Encounter (Signed)
Pt has been cleared from a cardiac standpoint for his upcoming hernia surgery per Dr Marlou Porch.  OK to hold Xarelto 2 days prior to his surgery.  Pt is aware at the time of his appt today with Dr Marlou Porch.

## 2016-02-21 NOTE — Telephone Encounter (Signed)
Will fax to Dr [name] as requested

## 2016-02-21 NOTE — Progress Notes (Signed)
Cardiology Office Note    Date:  02/21/2016   ID:  Curtis Dunn, DOB Mar 20, 1941, MRN [id number]  PCP:  [name], MD  Cardiologist:   [name], MD former [name] patient    History of Present Illness:  Curtis Dunn is a 75 y.o. male here for preoperative risk stratification prior to hernia surgery, Dr. [name].  His a history of paroxysmal atrial fibrillation with nuclear stress test in Mar 16, 2005 showing no ischemia, EF 57%. He was noted to have an irregular pulse about that time and in EKG showed atrial fibrillation with heart rate of 91 bpm. He was placed on Pradaxa 150 mg twice a day. Subsequently he has switched to Xarelto. No symptoms of chest pain shortness of breath and in fact he goes to gym regularly for workouts.  Echocardiogram in 09/09/2012 showed EF of 60-65% with grade 1 diastolic dysfunction. Pulmonary artery pressure was 35 mmHg. No significant valvular abnormality.  In 2014/03/16 he had a hypoglycemic episode and blacked out, fell, injured his head and suffered a neck fracture and right wrist fracture.  Atrial fibrillation clinic saw him 07/2015 and he was in normal rhythm at that time. Options were discussed including antiarrhythmic therapy possible ablation. Elected at that time to continue current therapy.  When [name] saw him last on 11/02/15, he was back in atrial fibrillation. He states that he was really not aware of his rhythm. No symptoms.  Former Emergency planning/management officer, Futures trader AM, also used to work on Interior and spatial designer. His wife, Curtis Dunn, died in 2005/03/16    Past Medical History  Diagnosis Date  . Palpitations 07/16/2005    normal nuclear study  . Tachycardia   . Diabetes mellitus   . Hypertension   . Hyperlipidemia   . Colon cancer ([place])   . Colon polyp   . Broken neck ([place])     in March 2015    Past Surgical History  Procedure Laterality Date  . Appendectomy  03/16/2000  . Colon resection    . Elbow fracture surgery    . Arm surgery     right shoulder surgery  . Ankle surgery      right    Current Medications: Outpatient Prescriptions Prior to Visit  Medication Sig Dispense Refill  . amLODipine (NORVASC) 10 MG tablet Take 10 mg by mouth daily.    [name] amoxicillin-clavulanate (AUGMENTIN) 875-125 MG tablet Take 1 tablet by mouth 2 (two) times daily. 20 tablet 0  . Ascorbic Acid (VITAMIN C) 1000 MG tablet Take 1,000 mg by mouth daily.    . Chelated Magnesium 100 MG TABS Take 1 tablet by mouth daily.     . Cyanocobalamin (VITAMIN B 12 PO) Take 1 tablet by mouth daily. Taking 1000 daily    . Docosahexaenoic Acid (DHA OMEGA 3 PO) Take 1,200 mg by mouth daily.     . furosemide (LASIX) 40 MG tablet Take 20-40 mg by mouth daily. Alternate 20 & 40 mg dose    . hydrochlorothiazide (HYDRODIURIL) 25 MG tablet Take 25 mg by mouth daily.    . insulin glargine (LANTUS) 100 UNIT/ML injection Inject 15 Units into the skin 2 (two) times daily.     . insulin lispro (HUMALOG) 100 UNIT/ML injection Inject 4-16 Units into the skin 3 (three) times daily before meals. Taking 12 units  Am; 10 UNITS  noon and 12 UNITS units at  6 pm    . lovastatin (MEVACOR) 20 MG tablet Take 20 mg by mouth  at bedtime.    . metoprolol succinate (TOPROL XL) 25 MG 24 hr tablet Take 1 tablet (25 mg total) by mouth daily. 90 tablet 2  . Potassium 99 MG TABS Take 1 tablet by mouth daily.     . ramipril (ALTACE) 10 MG tablet Take 10 mg by mouth daily.    . rivaroxaban (XARELTO) 20 MG TABS tablet Take 1 tablet (20 mg total) by mouth daily. 30 tablet 11  . Tamsulosin HCl (FLOMAX) 0.4 MG CAPS Take 0.4 mg by mouth daily.     . traMADol (ULTRAM) 50 MG tablet Take 1-2 tablets (50-100 mg total) by mouth every 6 (six) hours as needed (mild to moderate pain). 30 tablet 0  . vitamin E 400 UNIT capsule Take 400 Units by mouth daily.    . Zinc 25 MG TABS Take 1 tablet by mouth daily.    [name] HYDROcodone-acetaminophen (NORCO) 10-325 MG tablet Take 0.5-2 tablets by mouth every 6 (six) hours  as needed (1/2 tablet for mild pain, 1 tablet for moderate pain, 2 tablets for severe pain). 40 tablet 0  . polyethylene glycol (MIRALAX / GLYCOLAX) packet Take 17 g by mouth daily as needed. 14 each 0  . senna-docusate (SENOKOT-S) 8.6-50 MG tablet Take 2 tablets by mouth 2 (two) times daily.    [name] zolpidem (AMBIEN) 10 MG tablet Take 10 mg by mouth at bedtime as needed (sleep).      No facility-administered medications prior to visit.     Allergies:   Review of patient's allergies indicates no known allergies.   Social History   Social History  . Marital Status: Widowed    Spouse Name: N/A  . Number of Children: N/A  . Years of Education: N/A   Social History Main Topics  . Smoking status: Never Smoker   . Smokeless tobacco: Never Used     Comment: occasional cigar  . Alcohol Use: No  . Drug Use: No  . Sexual Activity: Not Asked   Other Topics Concern  . None   Social History Narrative     Family History:  The patient's family history includes Heart disease in his father. There is no history of Colon cancer, Esophageal cancer, Rectal cancer, or Stomach cancer.   ROS:   Please see the history of present illness.    ROS All other systems reviewed and are negative.   PHYSICAL EXAM:   VS:  BP 124/64 mmHg  Pulse 68  Ht 6' (1.829 m)  Wt 159 lb 9.6 oz (72.394 kg)  BMI 21.64 kg/m2   GEN: Well nourished, well developed, in no acute distress HEENT: normal Neck: no JVD, carotid bruits, or masses Cardiac: Irregularly irregular, normal rate; no murmurs, rubs, or gallops,no edema  Respiratory:  clear to auscultation bilaterally, normal work of breathing GI: soft, nontender, nondistended, + BS MS: no deformity or atrophy, Right shoulder chronic dislocation. Skin: warm and dry, no rash, ankle brace left Neuro:  Alert and Oriented x 3, Strength and sensation are intact Psych: euthymic mood, full affect  Wt Readings from Last 3 Encounters:  02/21/16 159 lb 9.6 oz (72.394 kg)    12/27/15 164 lb 0.4 oz (74.4 kg)  11/02/15 157 lb 1.9 oz (71.269 kg)      Studies/Labs Reviewed:   EKG:  Prior 12/25/15-sinus rhythm, nonspecific ST-T wave changes. Personally viewed  Recent Labs: 12/25/2015: ALT 26 12/28/2015: BUN 23*; Creatinine, Ser 1.07; Hemoglobin 9.3*; Platelets 201; Potassium 4.7; Sodium 140   Lipid Panel  Component Value Date/Time   CHOL 148 02/14/2014 0716   TRIG 106 02/14/2014 0716   HDL 48 02/14/2014 0716   CHOLHDL 3.1 02/14/2014 0716   VLDL 21 02/14/2014 0716   LDLCALC 79 02/14/2014 0716    Additional studies/ records that were reviewed today include:  Prior office notes reviewed, lab work reviewed  Echocardiogram 09/09/12: EF 60%   ASSESSMENT:    1. PAF (paroxysmal atrial fibrillation) ([place])   2. Essential hypertension   3. Preop cardiovascular exam   4. Inguinal hernia with obstruction without gangrene, recurrence not specified, unspecified laterality      PLAN:  In order of problems listed above:  Paroxysmal atrial fibrillation -Asymptomatic when he is in atrial fibrillation. -Overall well rate controlled, Toprol -We may see rapid ventricular response surrounding his surgery, may increase Toprol at time.  Preop cardiovascular examination -He may proceed with hernia repair, umbilical and inguinal with low overall cardiac risk. We may see atrial fibrillation in which case, increase beta blocker. Let us know if he can be of further assistance. He is able to complete greater than 4 METS of activity without difficulty he may proceed without further cardiac testing.  Essential hypertension -Dr. Anda Kraft has been monitoring. Multidrug regimen as above. Stable.  Syncope -Discussed the possibility of event monitor, however he is convinced that this episode occurred because of blood sugar abnormality, hypoglycemia and was quite similar to 2015, [name] did not order monitor at that time.  Diabetes -Insulin use  Recent  hospitalization in February 2017 with flail chest, ankle fracture, trauma, acute kidney injury -He fell down the stairs -911 was called however he stated his house for 2 more days until his son got back from the beach when he brought him to the hospital his creatinine was 2.8. BUN was 81.  Chronic anticoagulation -Continuing with Xarelto. He may hold his Xarelto for 2 days prior to surgery.    Medication Adjustments/Labs and Tests Ordered: Current medicines are reviewed at length with the patient today.  Concerns regarding medicines are outlined above.  Medication changes, Labs and Tests ordered today are listed in the Patient Instructions below. Patient Instructions  Medication Instructions:  The current medical regimen is effective;  continue present plan and medications.  Follow-Up: Follow up in 6 months with Dr. Marlou Porch.  You will receive a letter in the mail 2 months before you are due.  Please call us when you receive this letter to schedule your follow up appointment.  You have been cleared for hernia surgery from a cardiac standpoint.  You may hold your Xarelto 2 days prior to your surgery.  If you need a refill on your cardiac medications before your next appointment, please call your pharmacy.  Thank you for choosing [facility]!!          Signed, [name], MD  02/21/2016 5:12 PM    [facility] [place], [place], [place]  [address] Phone: [phone]; Fax: [phone]

## 2016-03-03 ENCOUNTER — Encounter: Payer: Self-pay | Admitting: Nurse Practitioner

## 2016-03-06 ENCOUNTER — Encounter (HOSPITAL_COMMUNITY): Payer: Self-pay

## 2016-03-06 ENCOUNTER — Encounter (HOSPITAL_COMMUNITY)
Admission: RE | Admit: 2016-03-06 | Discharge: 2016-03-06 | Disposition: A | Discharge status: Home / Self Care | Payer: Medicare Other | Source: Ambulatory Visit | Attending: Surgery | Admitting: Surgery

## 2016-03-06 DIAGNOSIS — I480 Paroxysmal atrial fibrillation: Secondary | ICD-10-CM | POA: Diagnosis not present

## 2016-03-06 DIAGNOSIS — Z01812 Encounter for preprocedural laboratory examination: Secondary | ICD-10-CM | POA: Diagnosis not present

## 2016-03-06 DIAGNOSIS — I10 Essential (primary) hypertension: Secondary | ICD-10-CM | POA: Insufficient documentation

## 2016-03-06 DIAGNOSIS — Z85038 Personal history of other malignant neoplasm of large intestine: Secondary | ICD-10-CM | POA: Diagnosis not present

## 2016-03-06 DIAGNOSIS — E119 Type 2 diabetes mellitus without complications: Secondary | ICD-10-CM | POA: Insufficient documentation

## 2016-03-06 DIAGNOSIS — Z01818 Encounter for other preprocedural examination: Secondary | ICD-10-CM | POA: Insufficient documentation

## 2016-03-06 DIAGNOSIS — E785 Hyperlipidemia, unspecified: Secondary | ICD-10-CM | POA: Diagnosis not present

## 2016-03-06 DIAGNOSIS — K429 Umbilical hernia without obstruction or gangrene: Secondary | ICD-10-CM | POA: Diagnosis not present

## 2016-03-06 DIAGNOSIS — Z794 Long term (current) use of insulin: Secondary | ICD-10-CM | POA: Diagnosis not present

## 2016-03-06 DIAGNOSIS — Z7901 Long term (current) use of anticoagulants: Secondary | ICD-10-CM | POA: Insufficient documentation

## 2016-03-06 DIAGNOSIS — K4090 Unilateral inguinal hernia, without obstruction or gangrene, not specified as recurrent: Secondary | ICD-10-CM | POA: Insufficient documentation

## 2016-03-06 DIAGNOSIS — Z79899 Other long term (current) drug therapy: Secondary | ICD-10-CM | POA: Insufficient documentation

## 2016-03-06 HISTORY — DX: Cardiac arrhythmia, unspecified: I49.9

## 2016-03-06 LAB — GLUCOSE, CAPILLARY: Glucose-Capillary: 77 mg/dL (ref 65–99)

## 2016-03-06 LAB — BASIC METABOLIC PANEL
Anion gap: 11 (ref 5–15)
BUN: 28 mg/dL — ABNORMAL HIGH (ref 6–20)
CO2: 32 mmol/L (ref 22–32)
Calcium: 10.1 mg/dL (ref 8.9–10.3)
Chloride: 99 mmol/L — ABNORMAL LOW (ref 101–111)
Creatinine, Ser: 1.37 mg/dL — ABNORMAL HIGH (ref 0.61–1.24)
GFR calc Af Amer: 57 mL/min — ABNORMAL LOW (ref >60)
GFR calc non Af Amer: 49 mL/min — ABNORMAL LOW (ref >60)
Glucose, Bld: 98 mg/dL (ref 65–99)
Potassium: 4.1 mmol/L (ref 3.5–5.1)
Sodium: 142 mmol/L (ref 135–145)

## 2016-03-06 LAB — CBC
HCT: 40.2 % (ref 39.0–52.0)
Hemoglobin: 13.4 g/dL (ref 13.0–17.0)
MCH: 30.4 pg (ref 26.0–34.0)
MCHC: 33.3 g/dL (ref 30.0–36.0)
MCV: 91.2 fL (ref 78.0–100.0)
Platelets: 286 10*3/uL (ref 150–400)
RBC: 4.41 MIL/uL (ref 4.22–5.81)
RDW: 13.1 % (ref 11.5–15.5)
WBC: 6.9 10*3/uL (ref 4.0–10.5)

## 2016-03-06 NOTE — Pre-Procedure Instructions (Addendum)
Curtis Dunn  03/06/2016      [facility] # [place], [place] [address] Phone: [phone] Fax: [phone]  [id number] - [place], [place] [address] [place] [address] Phone: [phone] Fax: [phone]  [facility] [place], [place] [address] Phone: [phone] Fax: [phone]  [facility] [place], [place] - [id number] [place] [place] [address] Phone: [phone] Fax: [phone]    Your procedure is scheduled on Thursday April 27th at 10:30  Report to [facility] Admitting at 8:30 am  Call this number if you have problems the morning of surgery: [phone]  If questions prior to surgery call [phone] betwee 8am & 4pm   Remember:  Do not eat food or drink liquids after midnight.  Take these medicines the morning of surgery with A SIP OF WATER: amlodipine (Norvasc), Metoprolol (ToprolXL)  Stop taking your Xarelto as instructed by your doctor.  As of today stop taking all your vitamin and mineral supplements.  Stop all Aspirin and Aspirin containing products, and Nsaids such as Aleve, Ibuprofen, Motrin, and Naproxen                         WHAT DO I DO ABOUT MY DIABETES MEDICATION?   . THE NIGHT BEFORE SURGERY, take 7.5 units of Lantus (half of usual dose) . THE MORNING OF SURGERY,  take 7.5 units of Lantus (half of usual dose)     . If your CBG is greater than 220 mg/dL, you may take  of your sliding scale (correction) dose of insulin.                       How to Manage Your Diabetes Before and After Surgery  Why is it important to control my blood sugar before and after surgery? . Improving blood sugar levels before and after surgery helps healing and can limit problems. . A way of improving blood sugar  control is eating a healthy diet by: o  Eating less sugar and carbohydrates o  Increasing activity/exercise o  Talking with your doctor about reaching your blood sugar goals . High blood sugars (greater than 180 mg/dL) can raise your risk of infections and slow your recovery, so you will need to focus on controlling your diabetes during the weeks before surgery. . Make sure that the doctor who takes care of your diabetes knows about your planned surgery including the date and location.  How do I manage my blood sugar before surgery? . Check your blood sugar at least 4 times a day, starting 2 days before surgery, to make sure that the level is not too high or low. o Check your blood sugar the morning of your surgery when you wake up and every 2 hours until you get to the Short Stay unit. . If your blood sugar is less than 70 mg/dL, you will need to treat for low blood sugar: o Do not take insulin. o Treat a low blood sugar (less than 70 mg/dL) with  cup of clear juice (cranberry or apple), 4 glucose tablets, OR glucose gel. o Recheck blood sugar in 15 minutes after treatment (to make sure it is greater than 70 mg/dL). If your blood sugar is not greater than 70 mg/dL  on recheck, call [phone] for further instructions. . Report your blood sugar to the short stay nurse when you get to Short Stay.  . If you are admitted to the hospital after surgery: o Your blood sugar will be checked by the staff and you will probably be given insulin after surgery (instead of oral diabetes medicines) to make sure you have good blood sugar levels. o The goal for blood sugar control after surgery is 80-180 mg/dL.   Do not wear jewelry  Do not wear lotions, powders, or cologne.  You may wear deodorant.  Men may shave face and neck.  Do not bring valuables to the hospital.  [facility] is not responsible for any belongings or valuables.  Contacts, dentures or bridgework may not be worn into surgery.  Leave  your suitcase in the car.  After surgery it may be brought to your room.  For patients admitted to the hospital, discharge time will be determined by your treatment team.  Patients discharged the day of surgery will not be allowed to drive home.   Special instructions:  Shower the night before and morning of surgery with CHG as instructed  Please read over the following fact sheets that you were given. Pain Booklet, Coughing and Deep Breathing and Surgical Site Infection Prevention

## 2016-03-06 NOTE — Progress Notes (Signed)
Cardiologist: Dr Marlou Porch  Last seen 02/21/16.  Notes and clearance in EPIC  PCP Dr Anda Kraft  Pt recently hospitalized for a fall down the stairs, with multiple fractures  ECHO 09/09/12 in EPIC Stress test 07/16/05 in EPIC Denies heart cath  EKG 12/25/15 CXR 12/29/15

## 2016-03-07 LAB — HEMOGLOBIN A1C
Hgb A1c MFr Bld: 5.9 % — ABNORMAL HIGH (ref 4.8–5.6)
Mean Plasma Glucose: 123 mg/dL

## 2016-03-07 NOTE — Progress Notes (Signed)
Anesthesia Chart Review: Patient is a 75 year old male scheduled for left inguinal hernia repair with mesh, umbilical hernia repair on 03/13/16 by Dr. [name].  History includes non-smoker, DM2, HTN, HLD, colon cancer (ceceal adenocarcinoma) s/p right hemicolectomy colon resection '01, PAF/afib, appendectomy. 02/10/14 fall from the stairs with acute comminuted displace fracture of C1 with gross disruption of C1-2 articulation with surrounding hematoma (hard cervical collar for at least 3 months ecommended by neurosurgeon Dr. Sherwood Gambler with out-patient follow-up xrays), left mandibular condyle dislocation and right mandibular condyle anterior subluxation (ENT Dr. Erik Obey felt likely from hard cervical collar and no actual injury), right wrist fracture (initially treated with splinting). 12/25/15 fall from ladder with left flail chest, large left hydropneumothorax and moderate left effusion with multiple comminuted left rib fracture and mild anterior wedging of T5 vertebral body without retropulsion, and AKI. Both falls have been attributed to hypoglycemic events.   PCP is Dr. Anda Kraft. Cardiologist is Dr. Marlou Porch (previously saw [name]). At his 02/21/16 office visit, he wrote,  "Preop cardiovascular examination -He may proceed with hernia repair, umbilical and inguinal with low overall cardiac risk. We may see atrial fibrillation in which case, increase beta blocker. Let us know if he can be of further assistance. He is able to complete greater than 4 METS of activity without difficulty he may proceed without further cardiac testing."  Meds amlodipine, chelated magnesium, krill oil, Lasix, HCTZ, Lantus, Humalog, lovastatin, Toprol XL, potassium, ramipril, Xarelto, Flomax, vitamin E, zinc. He was instructed to hold Xarelto 2 days prior to surgery.   12/25/15 EKG: NSR, incomplete right BBB, non-specific ST/T wave abnormality, prolonged QT.   09/09/12 Echo: Study Conclusions - Left ventricle: The cavity  size was normal. Wall thickness was normal. Systolic function was normal. The estimated ejection fraction was in the range of 60% to 65%. Wall motion was normal; there were no regional wall motion abnormalities. Doppler parameters are consistent with abnormal left ventricular relaxation (grade 1 diastolic dysfunction). - Right atrium: The atrium was moderately dilated. - Atrial septum: No defect or patent foramen ovale was identified. - Pulmonary arteries: PA peak pressure: 68mm Hg (S).  07/16/05 Nuclear stress test: Normal stress cardiolite. There is no evidence of ischemia and there is normal LV function. EF 57%.  12/29/15 1V CXR: IMPRESSION: Left chest tube removed without demonstrable pneumothorax. Persistent left lower lobe consolidation with left effusion. Mild medial right base atelectasis. No new parenchymal lung opacity. No change in cardiac silhouette. Multiple rib fractures bilaterally, more pronounced on the left than on the right with multiple displaced fractures on the left superiorly, unchanged.  11/01/12 Abdominal Aorta U/S: Normal caliber abdominal aorta, common and external iliac arteries, without focal stenosis, or dilatation.  Preoperative labs noted. BUN 28, Cr 1.37. Glucose 98. CBC WNL. A1c 5.9.  If no acute changes then I would anticipate that he could proceed as planned.  George Hugh [facility] Short Stay Center/Anesthesiology Phone [phone] 03/07/2016 11:10 AM

## 2016-03-12 MED ORDER — CEFAZOLIN SODIUM-DEXTROSE 2-4 GM/100ML-% IV SOLN
2.0000 g | INTRAVENOUS | Status: AC
  Administered 2016-03-13: 2 g via INTRAVENOUS
  Filled 2016-03-12: qty 100

## 2016-03-13 ENCOUNTER — Encounter (HOSPITAL_COMMUNITY)
Admission: RE | Disposition: A | Discharge status: Home / Self Care | Payer: Self-pay | Source: Ambulatory Visit | Attending: Surgery

## 2016-03-13 ENCOUNTER — Encounter (HOSPITAL_COMMUNITY): Payer: Self-pay | Admitting: *Deleted

## 2016-03-13 ENCOUNTER — Ambulatory Visit (HOSPITAL_COMMUNITY): Payer: Medicare Other | Admitting: Anesthesiology

## 2016-03-13 ENCOUNTER — Ambulatory Visit (HOSPITAL_COMMUNITY)
Admission: RE | Admit: 2016-03-13 | Discharge: 2016-03-13 | Disposition: A | Discharge status: Home / Self Care | Payer: Medicare Other | Source: Ambulatory Visit | Attending: Surgery | Admitting: Surgery

## 2016-03-13 ENCOUNTER — Ambulatory Visit (HOSPITAL_COMMUNITY): Payer: Medicare Other | Admitting: Vascular Surgery

## 2016-03-13 DIAGNOSIS — K4090 Unilateral inguinal hernia, without obstruction or gangrene, not specified as recurrent: Secondary | ICD-10-CM | POA: Insufficient documentation

## 2016-03-13 DIAGNOSIS — Z79899 Other long term (current) drug therapy: Secondary | ICD-10-CM | POA: Insufficient documentation

## 2016-03-13 DIAGNOSIS — I4891 Unspecified atrial fibrillation: Secondary | ICD-10-CM | POA: Insufficient documentation

## 2016-03-13 DIAGNOSIS — Z7901 Long term (current) use of anticoagulants: Secondary | ICD-10-CM | POA: Diagnosis not present

## 2016-03-13 DIAGNOSIS — E119 Type 2 diabetes mellitus without complications: Secondary | ICD-10-CM | POA: Insufficient documentation

## 2016-03-13 DIAGNOSIS — K429 Umbilical hernia without obstruction or gangrene: Secondary | ICD-10-CM | POA: Diagnosis not present

## 2016-03-13 DIAGNOSIS — Z794 Long term (current) use of insulin: Secondary | ICD-10-CM | POA: Diagnosis not present

## 2016-03-13 DIAGNOSIS — I10 Essential (primary) hypertension: Secondary | ICD-10-CM | POA: Insufficient documentation

## 2016-03-13 HISTORY — PX: INGUINAL HERNIA REPAIR: SHX194

## 2016-03-13 HISTORY — PX: INSERTION OF MESH: SHX5868

## 2016-03-13 LAB — GLUCOSE, CAPILLARY
Glucose-Capillary: 120 mg/dL — ABNORMAL HIGH (ref 65–99)
Glucose-Capillary: 171 mg/dL — ABNORMAL HIGH (ref 65–99)

## 2016-03-13 SURGERY — REPAIR, HERNIA, INGUINAL, ADULT
Anesthesia: General | Site: Groin | Laterality: Left

## 2016-03-13 MED ORDER — OXYCODONE HCL 5 MG PO TABS
5.0000 mg | ORAL_TABLET | Freq: Once | ORAL | Status: AC | PRN
Start: 2016-03-13 — End: 2016-03-13
  Administered 2016-03-13: 5 mg via ORAL

## 2016-03-13 MED ORDER — 0.9 % SODIUM CHLORIDE (POUR BTL) OPTIME
TOPICAL | Status: DC | PRN
  Administered 2016-03-13: 1000 mL

## 2016-03-13 MED ORDER — FENTANYL CITRATE (PF) 100 MCG/2ML IJ SOLN
25.0000 ug | INTRAMUSCULAR | Status: DC | PRN
  Administered 2016-03-13: 50 ug via INTRAVENOUS

## 2016-03-13 MED ORDER — LIDOCAINE HCL (CARDIAC) 20 MG/ML IV SOLN
INTRAVENOUS | Status: DC | PRN
  Administered 2016-03-13: 50 mg via INTRATRACHEAL

## 2016-03-13 MED ORDER — OXYCODONE HCL 5 MG/5ML PO SOLN: 5.0000 mg | Freq: Once | ORAL | Status: AC | PRN

## 2016-03-13 MED ORDER — OXYCODONE-ACETAMINOPHEN 5-325 MG PO TABS: 1.0000 | ORAL_TABLET | ORAL | Status: DC | PRN

## 2016-03-13 MED ORDER — PHENYLEPHRINE 40 MCG/ML (10ML) SYRINGE FOR IV PUSH (FOR BLOOD PRESSURE SUPPORT)
PREFILLED_SYRINGE | INTRAVENOUS | Status: AC
  Filled 2016-03-13: qty 10

## 2016-03-13 MED ORDER — ONDANSETRON HCL 4 MG/2ML IJ SOLN: 4.0000 mg | Freq: Once | INTRAMUSCULAR | Status: DC | PRN

## 2016-03-13 MED ORDER — BUPIVACAINE-EPINEPHRINE 0.25% -1:200000 IJ SOLN
INTRAMUSCULAR | Status: DC | PRN
  Administered 2016-03-13: 20 mL

## 2016-03-13 MED ORDER — ONDANSETRON HCL 4 MG/2ML IJ SOLN
INTRAMUSCULAR | Status: AC
  Filled 2016-03-13: qty 4

## 2016-03-13 MED ORDER — DEXTROSE 5 % IV SOLN
10.0000 mg | INTRAVENOUS | Status: DC | PRN
  Administered 2016-03-13: 20 ug/min via INTRAVENOUS

## 2016-03-13 MED ORDER — ONDANSETRON HCL 4 MG/2ML IJ SOLN
INTRAMUSCULAR | Status: DC | PRN
  Administered 2016-03-13: 4 mg via INTRAVENOUS

## 2016-03-13 MED ORDER — PROPOFOL 10 MG/ML IV BOLUS
INTRAVENOUS | Status: AC
  Filled 2016-03-13: qty 20

## 2016-03-13 MED ORDER — LIDOCAINE 2% (20 MG/ML) 5 ML SYRINGE
INTRAMUSCULAR | Status: AC
  Filled 2016-03-13: qty 10

## 2016-03-13 MED ORDER — CHLORHEXIDINE GLUCONATE 4 % EX LIQD: 1.0000 "application " | Freq: Once | CUTANEOUS | Status: DC

## 2016-03-13 MED ORDER — FENTANYL CITRATE (PF) 100 MCG/2ML IJ SOLN
INTRAMUSCULAR | Status: AC
  Filled 2016-03-13: qty 2

## 2016-03-13 MED ORDER — FENTANYL CITRATE (PF) 250 MCG/5ML IJ SOLN
INTRAMUSCULAR | Status: AC
  Filled 2016-03-13: qty 5

## 2016-03-13 MED ORDER — LACTATED RINGERS IV SOLN
INTRAVENOUS | Status: DC
  Administered 2016-03-13 (×2): via INTRAVENOUS

## 2016-03-13 MED ORDER — MIDAZOLAM HCL 2 MG/2ML IJ SOLN
INTRAMUSCULAR | Status: AC
  Filled 2016-03-13: qty 2

## 2016-03-13 MED ORDER — ONDANSETRON HCL 4 MG/2ML IJ SOLN: 4.0000 mg | INTRAMUSCULAR | Status: DC | PRN

## 2016-03-13 MED ORDER — PHENYLEPHRINE HCL 10 MG/ML IJ SOLN
INTRAMUSCULAR | Status: DC | PRN
  Administered 2016-03-13 (×2): 80 ug via INTRAVENOUS

## 2016-03-13 MED ORDER — FENTANYL CITRATE (PF) 250 MCG/5ML IJ SOLN
INTRAMUSCULAR | Status: DC | PRN
  Administered 2016-03-13: 100 ug
  Administered 2016-03-13: 100 ug via INTRAVENOUS

## 2016-03-13 MED ORDER — PROPOFOL 10 MG/ML IV BOLUS
INTRAVENOUS | Status: DC | PRN
  Administered 2016-03-13: 170 mg via INTRAVENOUS

## 2016-03-13 MED ORDER — OXYCODONE HCL 5 MG PO TABS
ORAL_TABLET | ORAL | Status: DC
Start: 2016-03-13 — End: 2016-03-13
  Filled 2016-03-13: qty 1

## 2016-03-13 MED ORDER — MORPHINE SULFATE (PF) 2 MG/ML IV SOLN: 2.0000 mg | INTRAVENOUS | Status: DC | PRN

## 2016-03-13 SURGICAL SUPPLY — 49 items
BENZOIN TINCTURE PRP APPL 2/3 (GAUZE/BANDAGES/DRESSINGS) ×2 IMPLANT
BLADE SURG 15 STRL LF DISP TIS (BLADE) ×1 IMPLANT
BLADE SURG 15 STRL SS (BLADE) ×1
BLADE SURG ROTATE 9660 (MISCELLANEOUS) IMPLANT
CHLORAPREP W/TINT 26ML (MISCELLANEOUS) ×2 IMPLANT
COVER SURGICAL LIGHT HANDLE (MISCELLANEOUS) ×2 IMPLANT
DRAIN PENROSE 1/2X12 LTX STRL (WOUND CARE) IMPLANT
DRAPE LAPAROSCOPIC ABDOMINAL (DRAPES) IMPLANT
DRAPE LAPAROTOMY TRNSV 102X78 (DRAPE) IMPLANT
DRAPE UTILITY XL STRL (DRAPES) ×2 IMPLANT
DRSG TEGADERM 4X4.75 (GAUZE/BANDAGES/DRESSINGS) ×4 IMPLANT
ELECT CAUTERY BLADE 6.4 (BLADE) ×2 IMPLANT
ELECT REM PT RETURN 9FT ADLT (ELECTROSURGICAL) ×2
ELECTRODE REM PT RTRN 9FT ADLT (ELECTROSURGICAL) ×1 IMPLANT
GAUZE SPONGE 4X4 12PLY STRL (GAUZE/BANDAGES/DRESSINGS) ×2 IMPLANT
GAUZE SPONGE 4X4 16PLY XRAY LF (GAUZE/BANDAGES/DRESSINGS) ×2 IMPLANT
GLOVE BIO SURGEON STRL SZ7 (GLOVE) ×4 IMPLANT
GLOVE BIOGEL PI IND STRL 7.0 (GLOVE) ×1 IMPLANT
GLOVE BIOGEL PI IND STRL 7.5 (GLOVE) ×1 IMPLANT
GLOVE BIOGEL PI INDICATOR 7.0 (GLOVE) ×1
GLOVE BIOGEL PI INDICATOR 7.5 (GLOVE) ×1
GOWN STRL REUS W/ TWL LRG LVL3 (GOWN DISPOSABLE) ×2 IMPLANT
GOWN STRL REUS W/TWL LRG LVL3 (GOWN DISPOSABLE) ×2
KIT BASIN OR (CUSTOM PROCEDURE TRAY) ×2 IMPLANT
KIT ROOM TURNOVER OR (KITS) ×2 IMPLANT
MESH PARIETEX PROGRIP LEFT (Mesh General) ×2 IMPLANT
NEEDLE HYPO 25GX1X1/2 BEV (NEEDLE) ×2 IMPLANT
NS IRRIG 1000ML POUR BTL (IV SOLUTION) ×2 IMPLANT
PACK SURGICAL SETUP 50X90 (CUSTOM PROCEDURE TRAY) ×2 IMPLANT
PAD ARMBOARD 7.5X6 YLW CONV (MISCELLANEOUS) ×2 IMPLANT
PENCIL BUTTON HOLSTER BLD 10FT (ELECTRODE) ×2 IMPLANT
SPONGE GAUZE 4X4 12PLY STER LF (GAUZE/BANDAGES/DRESSINGS) ×2 IMPLANT
SPONGE INTESTINAL PEANUT (DISPOSABLE) ×2 IMPLANT
STRIP CLOSURE SKIN 1/2X4 (GAUZE/BANDAGES/DRESSINGS) ×2 IMPLANT
SUT MNCRL AB 4-0 PS2 18 (SUTURE) ×4 IMPLANT
SUT NOVA NAB GS-21 0 18 T12 DT (SUTURE) ×2 IMPLANT
SUT PDS AB 0 CT 36 (SUTURE) IMPLANT
SUT SILK 2 0 SH (SUTURE) IMPLANT
SUT SILK 3 0 (SUTURE)
SUT SILK 3-0 18XBRD TIE 12 (SUTURE) IMPLANT
SUT VIC AB 0 CT2 27 (SUTURE) ×2 IMPLANT
SUT VIC AB 2-0 SH 27 (SUTURE) ×1
SUT VIC AB 2-0 SH 27X BRD (SUTURE) ×1 IMPLANT
SUT VIC AB 3-0 SH 27 (SUTURE) ×2
SUT VIC AB 3-0 SH 27X BRD (SUTURE) ×1 IMPLANT
SUT VIC AB 3-0 SH 27XBRD (SUTURE) ×1 IMPLANT
SYR CONTROL 10ML LL (SYRINGE) ×2 IMPLANT
TOWEL OR 17X24 6PK STRL BLUE (TOWEL DISPOSABLE) ×2 IMPLANT
TOWEL OR 17X26 10 PK STRL BLUE (TOWEL DISPOSABLE) ×2 IMPLANT

## 2016-03-13 NOTE — Anesthesia Postprocedure Evaluation (Signed)
Anesthesia Post Note  Patient: Curtis Dunn  Procedure(s) Performed: Procedure(s) (LRB): LEFT INGUINAL HERNIA REPAIR WITH UNBILICAL HERNIA REPAIR (Left) INSERTION OF MESH (Left)  Patient location during evaluation: PACU Anesthesia Type: General and Regional Level of consciousness: awake, awake and alert and oriented Pain management: pain level controlled Vital Signs Assessment: post-procedure vital signs reviewed and stable Respiratory status: spontaneous breathing, nonlabored ventilation and respiratory function stable Cardiovascular status: blood pressure returned to baseline Anesthetic complications: no    Last Vitals:  Filed Vitals:   03/13/16 1315 03/13/16 1335  BP: 116/56 135/60  Pulse:  60  Temp: 36.4 C   Resp:  18    Last Pain:  Filed Vitals:   03/13/16 1340  PainSc: 5                  Seline Enzor COKER

## 2016-03-13 NOTE — Anesthesia Preprocedure Evaluation (Addendum)

## 2016-03-13 NOTE — H&P (View-Only) (Signed)
  History of Present Illness Curtis Dunn. Buckley Bradly MD; 02/15/2016 12:02 PM) Patient words: hernia.  The patient is a 75 year old male who presents with an inguinal hernia. PCP - Anda Kraft Cardiology - Marlou Porch [facility])  This is a 75 year old male with multiple medical issues who was recently discharged from the hospital after a left pneumothorax and rib fractures from a fall off of a letter. The patient also had a left ankle fracture. This is being treated nonoperatively. The patient has noted a 4 month history of some mild discomfort in his left groin. He denies any swelling in that area previously. However over the last 10 days this discomfort has become much worse. There swelling around this area. It improves when he is supine. The patient is anticoagulated with Xarelto for atrial fibrillation. He presents now for surgical evaluation.   Allergies (Ammie Eversole, LPN; 579FGE [id number] AM) No Known Drug Allergies 01/09/2016  Medication History (Ammie Eversole, LPN; 579FGE QA348G AM) AmLODIPine Besylate (10MG  Tablet, Oral) Active. Chelated Magnesium (100MG  Tablet, Oral) Active. Vitamin C (500MG  Capsule, Oral) Active. Omega 3 (1200MG  Capsule, Oral) Active. Vitamin B 12 (50MCG Tablet, Oral) Active. Lasix (40MG  Tablet, Oral) Active. HydroDiuril (25MG  Tablet, Oral) Active. Lantus (100UNIT/ML Solution, Subcutaneous) Active. HumaLOG (100UNIT/ML Solution, Subcutaneous) Active. Mevacor (20MG  Tablet, Oral) Active. Toprol XL (25MG  Tablet ER 24HR, Oral) Active. Altace (10MG  Capsule, Oral) Active. Potassium (99MG  Tablet, Oral) Active. Xarelto (20MG  Tablet, Oral) Active. Flomax (0.4MG  Capsule, Oral) Active. Vitamin E (400UNIT Capsule, Oral) Active. Zinc (25MG  Tablet, Oral) Active. Medications Reconciled    Vitals (Ammie Eversole LPN; 579FGE [id number] AM) 02/15/2016 10:10 AM Weight: 157.8 lb Height: 72in Body Surface Area: 1.93 m Body Mass Index: 21.4 kg/m   Temp.: 97.65F(Oral)  Pulse: 82 (Regular)  BP: 122/72 (Sitting, Left Arm, Standard)      Physical Exam Rodman Key K. Jacari Iannello MD; 02/15/2016 12:06 PM)  The physical exam findings are as follows: Note:WDWN in NAD HEENT: EOMI, sclera anicteric Neck: No masses, no thyromegaly Lungs: CTA bilaterally; normal respiratory effort CV: Regular rate and rhythm; no murmurs Abd: +bowel sounds, soft, non-tender, small protruding umbilical hernia - reducible GU: bilateral descended testes; no testicular masses; reducible left inguinal hernia; no sign of right inguinal hernia Ext: Well-perfused; no edema Skin: Warm, dry; no sign of jaundice    Assessment & Plan Rodman Key K. Shavell Nored MD; 02/15/2016 10:40 AM)  REDUCIBLE LEFT INGUINAL HERNIA (K40.90)  Current Plans Pt Education - Pamphlet Given - Hernia Surgery: discussed with patient and provided information. UMBILICAL HERNIA WITHOUT OBSTRUCTION AND WITHOUT GANGRENE (K42.9)  Current Plans Schedule for Surgery - Left inguinal hernia repair with mesh/ umbilical hernia repair. The surgical procedure has been discussed with the patient. Potential risks, benefits, alternative treatments, and expected outcomes have been explained. All of the patient's questions at this time have been answered. The likelihood of reaching the patient's treatment goal is good. The patient understand the proposed surgical procedure and wishes to proceed.  Curtis Dunn. [name], MD, [facility]  General/ Trauma Surgery  02/15/2016 12:06 PM

## 2016-03-13 NOTE — Anesthesia Procedure Notes (Addendum)
Procedure Name: LMA Insertion Date/Time: 03/13/2016 9:54 AM Performed by: Mariea Clonts Pre-anesthesia Checklist: Patient identified, Patient being monitored, Emergency Drugs available, Timeout performed and Suction available Patient Re-evaluated:Patient Re-evaluated prior to inductionOxygen Delivery Method: Circle system utilized Preoxygenation: Pre-oxygenation with 100% oxygen Intubation Type: IV induction LMA: LMA inserted LMA Size: 5.0 Number of attempts: 1 Placement Confirmation: breath sounds checked- equal and bilateral and positive ETCO2 Tube secured with: Tape Dental Injury: Teeth and Oropharynx as per pre-operative assessment    Anesthesia Regional Block:  TAP block  Pre-Anesthetic Checklist: ,, timeout performed, Correct Patient, Correct Site, Correct Laterality, Correct Procedure, Correct Position, site marked, Risks and benefits discussed,  Surgical consent,  Pre-op evaluation,  At surgeon's request and post-op pain management  Laterality: Left  Prep: chloraprep       Needles:  Injection technique: Single-shot  Needle Type: Echogenic Stimulator Needle     Needle Length: 9cm 9 cm Needle Gauge: 21 and 21 G    Additional Needles:  Procedures: ultrasound guided (picture in chart) TAP block Narrative:  Start time: 03/13/2016 10:05 AM End time: 03/13/2016 10:10 AM Injection made incrementally with aspirations every 5 mL.  Performed by: Personally   Additional Notes: 30 cc 0.5% Bupivacaine with 1:200 epi injected easily

## 2016-03-13 NOTE — Discharge Instructions (Signed)
[facility]  UMBILICAL OR INGUINAL HERNIA REPAIR: POST OP INSTRUCTIONS  Always review your discharge instruction sheet given to you by the facility where your surgery was performed. IF YOU HAVE DISABILITY OR FAMILY LEAVE FORMS, YOU MUST BRING THEM TO THE OFFICE FOR PROCESSING.   DO NOT GIVE THEM TO YOUR DOCTOR.  1. A  prescription for pain medication may be given to you upon discharge.  Take your pain medication as prescribed, if needed.  If narcotic pain medicine is not needed, then you may take acetaminophen (Tylenol) or ibuprofen (Advil) as needed. 2. Take your usually prescribed medications unless otherwise directed. 3. If you need a refill on your pain medication, please contact your pharmacy.  They will contact our office to request authorization. Prescriptions will not be filled after 5 pm or on week-ends. 4. You should follow a light diet the first 24 hours after arrival home, such as soup and crackers, etc.  Be sure to include lots of fluids daily.  Resume your normal diet the day after surgery. 5. Most patients will experience some swelling and bruising around the umbilicus or in the groin and scrotum.  Ice packs and reclining will help.  Swelling and bruising can take several days to resolve.  6. It is common to experience some constipation if taking pain medication after surgery.  Increasing fluid intake and taking a stool softener (such as Colace) will usually help or prevent this problem from occurring.  A mild laxative (Milk of Magnesia or Miralax) should be taken according to package directions if there are no bowel movements after 48 hours. 7. Unless discharge instructions indicate otherwise, you may remove your bandages 24-48 hours after surgery, and you may shower at that time.  You will have steri-strips (small skin tapes) in place directly over the incision.  These strips should be left on the skin for 7-10 days. 8. ACTIVITIES:  You may resume regular (light)  daily activities beginning the next day--such as daily self-care, walking, climbing stairs--gradually increasing activities as tolerated.  You may have sexual intercourse when it is comfortable.  Refrain from any heavy lifting or straining until approved by your doctor. a. You may drive when you are no longer taking prescription pain medication, you can comfortably wear a seatbelt, and you can safely maneuver your car and apply brakes. b. RETURN TO WORK:  2-3 weeks with light duty - no lifting over 15 lbs. 9. You should see your doctor in the office for a follow-up appointment approximately 2-3 weeks after your surgery.  Make sure that you call for this appointment within a day or two after you arrive home to insure a convenient appointment time. 10. OTHER INSTRUCTIONS:  __________________________________________________________________________________________________________________________________________________________________________________________  WHEN TO CALL YOUR DOCTOR: 1. Fever over 101.0 2. Inability to urinate 3. Nausea and/or vomiting 4. Extreme swelling or bruising 5. Continued bleeding from incision. 6. Increased pain, redness, or drainage from the incision  The clinic staff is available to answer your questions during regular business hours.  Please don't hesitate to call and ask to speak to one of the nurses for clinical concerns.  If you have a medical emergency, go to the nearest emergency room or call 911.  A surgeon from [facility] is always on call at the hospital   [address], [address], [place], [place]  [address] ?  [address] [address], [place], [place]   [address] [phone]    [phone]    FAX [phone] Web   site: [email or web address]    

## 2016-03-13 NOTE — Interval H&P Note (Signed)
History and Physical Interval Note:  03/13/2016 9:35 AM  [name]  has presented today for surgery, with the diagnosis of Left inguinal hernia/ umbilical hernia  The various methods of treatment have been discussed with the patient and family. After consideration of risks, benefits and other options for treatment, the patient has consented to  Procedure(s): LEFT INGUINAL HERNIA REPAIR WITH UNBILICAL HERNIA REPAIR (Left) INSERTION OF MESH (Left) as a surgical intervention .  The patient's history has been reviewed, patient examined, no change in status, stable for surgery.  I have reviewed the patient's chart and labs.  Questions were answered to the patient's satisfaction.     Ryen Heitmeyer K.

## 2016-03-13 NOTE — Op Note (Signed)
Left inguinal hernia repair with mesh/ umbilical hernia repair  Procedure Note  Indications: The patient presented with a history of a left, reducible inguinal hernia as well as a small umbilical hernia.  Pre-operative Diagnosis: left reducible inguinal hernia and umbilical hernia Post-operative Diagnosis: same  Surgeon: Maia Petties.   Assistants: none  Anesthesia: General LMA anesthesia and TAP block  ASA Class: 2  Procedure Details  The patient was seen again in the Holding Room. The risks, benefits, complications, treatment options, and expected outcomes were discussed with the patient. The possibilities of reaction to medication, pulmonary aspiration, perforation of viscus, bleeding, recurrent infection, the need for additional procedures, and development of a complication requiring transfusion or further operation were discussed with the patient and/or family. The likelihood of success in repairing the hernia and returning the patient to their previous functional status is good.  There was concurrence with the proposed plan, and informed consent was obtained. The site of surgery was properly noted/marked. The patient was taken to the Operating Room, identified as MAXMILIAN GRANNAN, and the procedure verified as left inguinal hernia repair and umbilical hernia repair. A Time Out was held and the above information confirmed.  The patient was placed in the supine position and underwent induction of anesthesia. The abdomen and groin was prepped with Chloraprep and draped in the standard fashion, and 0.25% Marcaine with epinephrine was used to anesthetize the skin over the mid-portion of the inguinal canal. An oblique incision was made. Dissection was carried down through the subcutaneous tissue with cautery to the external oblique fascia.  We opened the external oblique fascia along the direction of its fibers to the external ring.  The spermatic cord was circumferentially dissected bluntly  and retracted with a Penrose drain.  The ilioinguinal nerve was identified and preserved.  The floor of the inguinal canal was inspected and was intact.  We skeletonized the spermatic cord and reduced a moderate-sized indirect hernia sac.  The internal ring was tightened with 0 Vicryl.  We used a left-sided Progrip mesh which was inserted and deployed across the floor of the inguinal canal. The mesh was tucked underneath the external oblique fascia laterally.  The flap of the mesh was closed around the spermatic cord to recreate the internal inguinal ring.  The mesh was secured to the pubic tubercle with 0 Vicryl.  Additional interrupted 0 Vicryl sutures were used to secure the lower edge of the mesh to the shelving edge, as well as the flap closure.  The external oblique fascia was reapproximated with 2-0 Vicryl.  3-0 Vicryl was used to close the subcutaneous tissues and 4-0 Monocryl was used to close the skin in subcuticular fashion.     We then turned our attention to the umbilicus.  We made a transverse incision above the umbilicus.  Dissection was carried down to the hernia sac with cautery.  We dissected bluntly around the hernia sac down to the edge of the fascial defect.  We reduced the hernia sac back into the pre-peritoneal space.  The fascial defect measured less than 1 cm.  We cleared the fascia in all directions.  The fascial defect was closed with multiple interrupted figure-of-eight 0 Novofil sutures.  The base of the umbilicus was tacked down with 3-0 Vicryl.  3-0 Vicryl was used to close the subcutaneous tissues and 4-0 Monocryl was used to close the skin.  Steri-strips and clean dressing were applied.   Benzoin and steri-strips were used to seal the left inguinal  incision.  A clean dressing was applied.  The patient was then extubated and brought to the recovery room in stable condition.  All sponge, instrument, and needle counts were correct prior to closure and at the conclusion of the  case.   Estimated Blood Loss: Minimal                 Complications: None; patient tolerated the procedure well.         Disposition: PACU - hemodynamically stable.         Condition: stable   Curtis Dunn. [name], MD, [facility]  General/ Trauma Surgery  03/13/2016 11:28 AM

## 2016-03-13 NOTE — Transfer of Care (Signed)
Immediate Anesthesia Transfer of Care Note  Patient: Curtis Dunn  Procedure(s) Performed: Procedure(s): LEFT INGUINAL HERNIA REPAIR WITH UNBILICAL HERNIA REPAIR (Left) INSERTION OF MESH (Left)  Patient Location: PACU  Anesthesia Type:General  Level of Consciousness: awake, alert  and oriented  Airway & Oxygen Therapy: Patient Spontanous Breathing and Patient connected to nasal cannula oxygen  Post-op Assessment: Report given to RN, Post -op Vital signs reviewed and stable and Patient moving all extremities X 4  Post vital signs: Reviewed and stable  Last Vitals:  Filed Vitals:   03/13/16 1315 03/13/16 1335  BP: 116/56 135/60  Pulse:  60  Temp: 36.4 C   Resp:  18    Last Pain:  Filed Vitals:   03/13/16 1340  PainSc: 5          Complications: No apparent anesthesia complications

## 2016-03-14 ENCOUNTER — Encounter (HOSPITAL_COMMUNITY): Payer: Self-pay | Admitting: Surgery

## 2016-05-12 ENCOUNTER — Ambulatory Visit: Payer: Medicare Other | Admitting: Cardiology

## 2016-05-16 ENCOUNTER — Other Ambulatory Visit: Payer: Self-pay

## 2016-05-16 MED ORDER — RIVAROXABAN 20 MG PO TABS: 20.0000 mg | ORAL_TABLET | Freq: Every day | ORAL | Status: DC

## 2016-06-26 ENCOUNTER — Ambulatory Visit (INDEPENDENT_AMBULATORY_CARE_PROVIDER_SITE_OTHER): Payer: Medicare Other | Admitting: Emergency Medicine

## 2016-06-26 VITALS — BP 110/60 | HR 105 | Temp 97.9°F | Resp 16 | Ht 69.0 in | Wt 159.4 lb

## 2016-06-26 DIAGNOSIS — L03115 Cellulitis of right lower limb: Secondary | ICD-10-CM

## 2016-06-26 DIAGNOSIS — R739 Hyperglycemia, unspecified: Secondary | ICD-10-CM

## 2016-06-26 DIAGNOSIS — S61402A Unspecified open wound of left hand, initial encounter: Secondary | ICD-10-CM

## 2016-06-26 LAB — POCT CBC
Granulocyte percent: 69.3 %G (ref 37–80)
HCT, POC: 43 % — AB (ref 43.5–53.7)
Hemoglobin: 15.1 g/dL (ref 14.1–18.1)
Lymph, poc: 1.5 (ref 0.6–3.4)
MCH, POC: 31.7 pg — AB (ref 27–31.2)
MCHC: 35.2 g/dL (ref 31.8–35.4)
MCV: 90.1 fL (ref 80–97)
MID (cbc): 1 — AB (ref 0–0.9)
MPV: 7.4 fL (ref 0–99.8)
POC Granulocyte: 5.5 (ref 2–6.9)
POC LYMPH PERCENT: 18.8 %L (ref 10–50)
POC MID %: 11.9 %M (ref 0–12)
Platelet Count, POC: 201 10*3/uL (ref 142–424)
RBC: 4.77 M/uL (ref 4.69–6.13)
RDW, POC: 14.5 %
WBC: 8 10*3/uL (ref 4.6–10.2)

## 2016-06-26 LAB — GLUCOSE, POCT (MANUAL RESULT ENTRY): POC Glucose: 148 mg/dl — AB (ref 70–99)

## 2016-06-26 MED ORDER — CEFTRIAXONE SODIUM 1 G IJ SOLR
1.0000 g | Freq: Once | INTRAMUSCULAR | Status: AC
  Administered 2016-06-26: 1 g via INTRAMUSCULAR

## 2016-06-26 MED ORDER — AMOXICILLIN-POT CLAVULANATE 875-125 MG PO TABS: 1.0000 | ORAL_TABLET | Freq: Two times a day (BID) | ORAL | 0 refills | Status: DC

## 2016-06-26 NOTE — Progress Notes (Addendum)
Subjective:  This chart was scribed for Arlyss Queen MD, by Tamsen Roers, at [facility].  This patient was seen in room 8 and the patient's care was started at 10:56 AM.   Chief Complaint  Patient presents with  . Other    per pt possible cellulitis right ankle     Patient ID: Curtis Dunn, male    DOB: 02/26/1941, 75 y.o.   MRN: [id number]  HPI HPI Comments: Curtis Dunn is a 75 y.o. male who presents to the [facility] complaining of a rash on his right ankle onset two nights ago which he noticed after taking a walk outdoors. There was initially a raised area on the outside of his ankle which was very sore to touch.  He put Bengay on the area but denies any relief.  He now has a healing cream applied to it.  He denies scraping his leg or any injury to the area.    Patient has had a similar rash in the past and was given antibiotics which seemed to help.  He has also fractured his ankle in the same location previously.   Patient has a history of diabetes and had an incident recently which caused him to fall down the stairs and break his ribs as well as pnuemothorax.  He has been recovering from this accident.  He believes that his sugar was very low when the incident occurred. Currently is compliant with his Humalog and lantis.      Patient Active Problem List   Diagnosis Date Noted  . Multiple fractures of ribs of both sides 12/29/2015  . Flail chest 12/25/2015  . AKI (acute kidney injury) ([place]) 12/25/2015  . Hemopneumothorax on left 12/25/2015  . IDDM (insulin dependent diabetes mellitus) ([place]) 12/25/2015  . History of colon cancer 08/21/2014  . Acute blood loss anemia 02/11/2014  . Fracture dislocation of cervical spine ([place]) 02/10/2014  . C1 cervical fracture ([place]) 02/10/2014  . Fall 02/10/2014  . Distal radius fracture, right 02/10/2014  . Concussion 02/10/2014  . Facial laceration 02/10/2014  . Anticoagulated  02/10/2014  . PAF (paroxysmal atrial fibrillation) ([place]) 09/08/2012  . Palpitations 03/05/2012  . MALIGNANT NEOPLASM OF CECUM 12/11/2007  . COLONIC POLYPS 12/11/2007  . DIABETES MELLITUS, TYPE I 12/11/2007  . Essential hypertension 12/11/2007   Past Medical History:  Diagnosis Date  . Broken neck ([place])    in March 2015  . Cataract   . Colon cancer ([place])   . Colon polyp   . Diabetes mellitus   . Dysrhythmia    a fib  . Hyperlipidemia   . Hypertension   . Paroxysmal atrial fibrillation [facility])    Past Surgical History:  Procedure Laterality Date  . ANKLE SURGERY     right  . APPENDECTOMY  2001  . arm surgery     right shoulder surgery  . CATARACT EXTRACTION EXTRACAPSULAR Bilateral   . COLON RESECTION    . ELBOW FRACTURE SURGERY    . INGUINAL HERNIA REPAIR Left 03/13/2016   Procedure: LEFT INGUINAL HERNIA REPAIR WITH UNBILICAL HERNIA REPAIR;  Surgeon: [name], MD;  Location: [place];  Service: General;  Laterality: Left;  . INSERTION OF MESH Left 03/13/2016   Procedure: INSERTION OF MESH;  Surgeon: [name], MD;  Location: [place];  Service: General;  Laterality: Left;  [name] VASECTOMY     Allergies  Allergen Reactions  . Acyclovir And Related Other (See Comments)  Unspecified   Prior to Admission medications   Medication Sig Start Date End Date Taking? Authorizing Provider  amLODipine (NORVASC) 10 MG tablet Take 10 mg by mouth daily.    Historical Provider, MD  Ascorbic Acid (VITAMIN C) 1000 MG tablet Take 1,000 mg by mouth daily.    Historical Provider, MD  Chelated Magnesium 100 MG TABS Take 1 tablet by mouth daily.     Historical Provider, MD  Cyanocobalamin (VITAMIN B 12 PO) Take 1 tablet by mouth daily. Taking 1000 daily    Historical Provider, MD  Fish Oil-Krill Oil (KRILL OIL PLUS PO) Take 1,085 mg by mouth daily.    Historical Provider, MD  furosemide (LASIX) 40 MG tablet Take 20-40 mg by mouth daily. Alternate 20 & 40 mg dose    Historical Provider, MD    hydrochlorothiazide (HYDRODIURIL) 25 MG tablet Take 25 mg by mouth daily.    Historical Provider, MD  insulin glargine (LANTUS) 100 UNIT/ML injection Inject 15 Units into the skin 2 (two) times daily.     Historical Provider, MD  insulin lispro (HUMALOG) 100 UNIT/ML injection Inject 10-12 Units into the skin 3 (three) times daily before meals. Taking 12 units  Am; 10 UNITS  noon and 12 UNITS units at  6 pm    Historical Provider, MD  lovastatin (MEVACOR) 20 MG tablet Take 20 mg by mouth at bedtime.    Historical Provider, MD  metoprolol succinate (TOPROL XL) 25 MG 24 hr tablet Take 1 tablet (25 mg total) by mouth daily. 02/13/16   Darlin Coco, MD  oxyCODONE-acetaminophen (PERCOCET/ROXICET) 5-325 MG tablet Take 1 tablet by mouth every 4 (four) hours as needed for severe pain. 03/13/16   [name], MD  Potassium 99 MG TABS Take 1 tablet by mouth daily.     Historical Provider, MD  ramipril (ALTACE) 10 MG tablet Take 10 mg by mouth daily.    Historical Provider, MD  rivaroxaban (XARELTO) 20 MG TABS tablet Take 1 tablet (20 mg total) by mouth daily. 05/16/16   Jettie Booze, MD  Tamsulosin HCl (FLOMAX) 0.4 MG CAPS Take 0.4 mg by mouth daily.     Historical Provider, MD  vitamin E 400 UNIT capsule Take 400 Units by mouth daily.    Historical Provider, MD  Zinc 25 MG TABS Take 25 mg by mouth daily.     Historical Provider, MD   Social History   Social History  . Marital status: Widowed    Spouse name: N/A  . Number of children: N/A  . Years of education: N/A   Occupational History  . Not on file.   Social History Main Topics  . Smoking status: Never Smoker  . Smokeless tobacco: Never Used     Comment: occasional cigar  . Alcohol use No  . Drug use: No  . Sexual activity: Not on file   Other Topics Concern  . Not on file   Social History Narrative  . No narrative on file      Review of Systems  Constitutional: Negative for chills and fever.  Eyes: Negative for pain,  redness and itching.  Respiratory: Negative for cough and choking.   Gastrointestinal: Negative for nausea and vomiting.  Skin: Positive for color change and rash.       Objective:   Physical Exam Vitals:   06/26/16 1038  BP: 110/60  Pulse: (!) 105  Resp: 16  Temp: 97.9 F (36.6 C)  TempSrc: Oral  SpO2: 96%  Weight: 159 lb 6.4 oz (72.3 kg)  Height: 5\' 9"  (1.753 m)     CONSTITUTIONAL: Well developed/well nourished HEAD: Normocephalic/atraumatic EYES: EOMI/PERRL ENMT: Mucous membranes moist NECK: supple no meningeal signs SPINE/BACK:entire spine nontender CV: S1/S2 noted, no murmurs/rubs/gallops noted LUNGS: Lungs are clear to auscultation bilaterally, no apparent distress NEURO: Pt is awake/alert/appropriate, moves all extremitiesx4.  No facial droop.   EXTREMITIES: pulses normal/equal, full ROM SKIN: 1 by 1 cm abrasion on top of the left hand, 8 inches of redness with pigmentary changes and tenderness lateral right lower leg. There is a healed for a surgical scar over the lateral malleolus..  PSYCH: no abnormalities of mood noted, alert and oriented to situation   Results for orders placed or performed in visit on 06/26/16  POCT CBC  Result Value Ref Range   WBC 8.0 4.6 - 10.2 K/uL   Lymph, poc 1.5 0.6 - 3.4   POC LYMPH PERCENT 18.8 10 - 50 %L   MID (cbc) 1.0 (A) 0 - 0.9   POC MID % 11.9 0 - 12 %M   POC Granulocyte 5.5 2 - 6.9   Granulocyte percent 69.3 37 - 80 %G   RBC 4.77 4.69 - 6.13 M/uL   Hemoglobin 15.1 14.1 - 18.1 g/dL   HCT, POC 43.0 (A) 43.5 - 53.7 %   MCV 90.1 80 - 97 fL   MCH, POC 31.7 (A) 27 - 31.2 pg   MCHC 35.2 31.8 - 35.4 g/dL   RDW, POC 14.5 %   Platelet Count, POC 201 142 - 424 K/uL   MPV 7.4 0 - 99.8 fL  POCT glucose (manual entry)  Result Value Ref Range   POC Glucose 148 (A) 70 - 99 mg/dl   Meds ordered this encounter  Medications  . cefTRIAXone (ROCEPHIN) injection 1 g  . amoxicillin-clavulanate (AUGMENTIN) 875-125 MG tablet    Sig:  Take 1 tablet by mouth 2 (two) times daily.    Dispense:  20 tablet    Refill:  0       Assessment & Plan:  Patient given 1 g Rocephin. His sugar is under control. His white cell count is normalHe did have surgery as a young man on his ankle but does not know any of the specifics.. He does have a wound on his left hand which was cultured. He was given 1 g of Rocephin evaluation tomorrow at 1:30. He will be on outpatient oral Augmentin.I personally performed the services described in this documentation, which was scribed in my presence. The recorded information has been reviewed and is accurate.  Darlyne Russian, MD

## 2016-06-26 NOTE — Patient Instructions (Addendum)
Please return tomorrow at 1:30  for leg recheck.    IF you received an x-ray today, you will receive an invoice from [facility]. Please contact [facility] at [phone] with questions or concerns regarding your invoice.   IF you received labwork today, you will receive an invoice from [employer]. Please contact [facility] at [phone] with questions or concerns regarding your invoice.   Our billing staff will not be able to assist you with questions regarding bills from these companies.  You will be contacted with the lab results as soon as they are available. The fastest way to get your results is to activate your My Chart account. Instructions are located on the last page of this paperwork. If you have not heard from [place] regarding the results in 2 weeks, please contact this office.     Cellulitis Cellulitis is an infection of the skin and the tissue beneath it. The infected area is usually red and tender. Cellulitis occurs most often in the arms and lower legs.  CAUSES  Cellulitis is caused by bacteria that enter the skin through cracks or cuts in the skin. The most common types of bacteria that cause cellulitis are staphylococci and streptococci. SIGNS AND SYMPTOMS   Redness and warmth.  Swelling.  Tenderness or pain.  Fever. DIAGNOSIS  Your health care provider can usually determine what is wrong based on a physical exam. Blood tests may also be done. TREATMENT  Treatment usually involves taking an antibiotic medicine. HOME CARE INSTRUCTIONS   Take your antibiotic medicine as directed by your health care provider. Finish the antibiotic even if you start to feel better.  Keep the infected arm or leg elevated to reduce swelling.  Apply a warm cloth to the affected area up to 4 times per day to relieve pain.  Take medicines only as directed by your health care provider.  Keep all follow-up visits as directed by your health  care provider. SEEK MEDICAL CARE IF:   You notice red streaks coming from the infected area.  Your red area gets larger or turns dark in color.  Your bone or joint underneath the infected area becomes painful after the skin has healed.  Your infection returns in the same area or another area.  You notice a swollen bump in the infected area.  You develop new symptoms.  You have a fever. SEEK IMMEDIATE MEDICAL CARE IF:   You feel very sleepy.  You develop vomiting or diarrhea.  You have a general ill feeling (malaise) with muscle aches and pains.   This information is not intended to replace advice given to you by your health care provider. Make sure you discuss any questions you have with your health care provider.   Document Released: 08/13/2005 Document Revised: 07/25/2015 Document Reviewed: 01/19/2012 Elsevier Interactive Patient Education [employer].

## 2016-06-27 ENCOUNTER — Ambulatory Visit (INDEPENDENT_AMBULATORY_CARE_PROVIDER_SITE_OTHER): Payer: Medicare Other | Admitting: Emergency Medicine

## 2016-06-27 ENCOUNTER — Ambulatory Visit (INDEPENDENT_AMBULATORY_CARE_PROVIDER_SITE_OTHER): Payer: Medicare Other

## 2016-06-27 ENCOUNTER — Encounter: Payer: Self-pay | Admitting: Emergency Medicine

## 2016-06-27 VITALS — BP 116/68 | HR 94 | Temp 98.1°F | Resp 17 | Ht 69.0 in | Wt 161.0 lb

## 2016-06-27 DIAGNOSIS — L03115 Cellulitis of right lower limb: Secondary | ICD-10-CM

## 2016-06-27 MED ORDER — CEFTRIAXONE SODIUM 1 G IJ SOLR
1.0000 g | Freq: Once | INTRAMUSCULAR | Status: AC
  Administered 2016-06-27: 1 g via INTRAMUSCULAR

## 2016-06-27 NOTE — Progress Notes (Addendum)
Patient ID: Curtis Dunn, male   DOB: [birth year], 75 y.o.   MRN: [id number]    By signing my name below, I, Essence Howell, attest that this documentation has been prepared under the direction and in the presence of Darlyne Russian, MD Electronically Signed: Ladene Artist, ED Scribe 06/27/2016 at 1:59 PM.  Chief Complaint:  Chief Complaint  Patient presents with  . Ankle Injury     right swelling    HPI: Curtis Dunn is a 75 y.o. male who reports to [facility] today for a recheck of cellulitis to the right ankle. Pt was seen in the office yesterday for red rash to the right ankle with swelling first noticed 2 days ago after walking outdoors. He was given 1g Rocephin at that visit and started on Augmentin which he has been complaint with. Pt states that the rash looks worse but feels better; states that pain has resolved. He states "I have not felt right over the past 3-4 weeks". Pt denies fever. He reports previous right ankle fracture with hardware in the 1960s.   Past Medical History:  Diagnosis Date  . Broken neck ([place])    in March 2015  . Cataract   . Colon cancer ([place])   . Colon polyp   . Diabetes mellitus   . Dysrhythmia    a fib  . Hyperlipidemia   . Hypertension   . Paroxysmal atrial fibrillation [facility])    Past Surgical History:  Procedure Laterality Date  . ANKLE SURGERY     right  . APPENDECTOMY  2001  . arm surgery     right shoulder surgery  . CATARACT EXTRACTION EXTRACAPSULAR Bilateral   . COLON RESECTION    . ELBOW FRACTURE SURGERY    . INGUINAL HERNIA REPAIR Left 03/13/2016   Procedure: LEFT INGUINAL HERNIA REPAIR WITH UNBILICAL HERNIA REPAIR;  Surgeon: [name], MD;  Location: [place];  Service: General;  Laterality: Left;  . INSERTION OF MESH Left 03/13/2016   Procedure: INSERTION OF MESH;  Surgeon: [name], MD;  Location: [place];  Service: General;  Laterality: Left;  [name] VASECTOMY     Social History   Social History  . Marital status:  Widowed    Spouse name: N/A  . Number of children: N/A  . Years of education: N/A   Social History Main Topics  . Smoking status: Never Smoker  . Smokeless tobacco: Never Used     Comment: occasional cigar  . Alcohol use No  . Drug use: No  . Sexual activity: Not on file   Other Topics Concern  . Not on file   Social History Narrative  . No narrative on file   Family History  Problem Relation Age of Onset  . Heart disease Father   . Colon cancer Neg Hx   . Esophageal cancer Neg Hx   . Rectal cancer Neg Hx   . Stomach cancer Neg Hx    Allergies  Allergen Reactions  . Acyclovir And Related Other (See Comments)    Unspecified   Prior to Admission medications   Medication Sig Start Date End Date Taking? Authorizing Provider  amLODipine (NORVASC) 10 MG tablet Take 10 mg by mouth daily.    Historical Provider, MD  amoxicillin-clavulanate (AUGMENTIN) 875-125 MG tablet Take 1 tablet by mouth 2 (two) times daily. 06/26/16   Darlyne Russian, MD  Ascorbic Acid (VITAMIN C) 1000 MG tablet Take 1,000 mg by mouth daily.    Historical Provider,  MD  Chelated Magnesium 100 MG TABS Take 1 tablet by mouth daily.     Historical Provider, MD  Cyanocobalamin (VITAMIN B 12 PO) Take 1 tablet by mouth daily. Taking 1000 daily    Historical Provider, MD  Fish Oil-Krill Oil (KRILL OIL PLUS PO) Take 1,085 mg by mouth daily.    Historical Provider, MD  furosemide (LASIX) 40 MG tablet Take 20-40 mg by mouth daily. Alternate 20 & 40 mg dose    Historical Provider, MD  hydrochlorothiazide (HYDRODIURIL) 25 MG tablet Take 25 mg by mouth daily.    Historical Provider, MD  insulin glargine (LANTUS) 100 UNIT/ML injection Inject 15 Units into the skin 2 (two) times daily.     Historical Provider, MD  insulin lispro (HUMALOG) 100 UNIT/ML injection Inject 10-12 Units into the skin 3 (three) times daily before meals. Taking 12 units  Am; 10 UNITS  noon and 12 UNITS units at  6 pm    Historical Provider, MD    lovastatin (MEVACOR) 20 MG tablet Take 20 mg by mouth at bedtime.    Historical Provider, MD  metoprolol succinate (TOPROL XL) 25 MG 24 hr tablet Take 1 tablet (25 mg total) by mouth daily. 02/13/16   Darlin Coco, MD  oxyCODONE-acetaminophen (PERCOCET/ROXICET) 5-325 MG tablet Take 1 tablet by mouth every 4 (four) hours as needed for severe pain. Patient not taking: Reported on 06/26/2016 03/13/16   [name], MD  Potassium 99 MG TABS Take 1 tablet by mouth daily.     Historical Provider, MD  ramipril (ALTACE) 10 MG tablet Take 10 mg by mouth daily.    Historical Provider, MD  rivaroxaban (XARELTO) 20 MG TABS tablet Take 1 tablet (20 mg total) by mouth daily. 05/16/16   Jettie Booze, MD  Tamsulosin HCl (FLOMAX) 0.4 MG CAPS Take 0.4 mg by mouth daily.     Historical Provider, MD  vitamin E 400 UNIT capsule Take 400 Units by mouth daily.    Historical Provider, MD  Zinc 25 MG TABS Take 25 mg by mouth daily.     Historical Provider, MD   ROS: The patient denies fevers, chills, night sweats, unintentional weight loss, chest pain, palpitations, wheezing, dyspnea on exertion, nausea, vomiting, abdominal pain, dysuria, hematuria, melena, numbness, weakness, or tingling. +rash, +color change  All other systems have been reviewed and were otherwise negative with the exception of those mentioned in the HPI and as above.    PHYSICAL EXAM: Vitals:   06/27/16 1341  BP: 116/68  Pulse: 94  Resp: 17  Temp: 98.1 F (36.7 C)   Body mass index is 23.78 kg/m.  General: Alert, no acute distress HEENT:  Normocephalic, atraumatic, oropharynx patent. Eye: [facility] Cardiovascular:  Regular rate and rhythm, no rubs murmurs or gallops.  No Carotid bruits, radial pulse intact. No pedal edema.  Respiratory: Clear to auscultation bilaterally.  No wheezes, rales, or rhonchi.  No cyanosis, no use of accessory musculature Abdominal: No organomegaly, abdomen is soft and non-tender, positive bowel  sounds.  No masses. Musculoskeletal: Gait intact. No edema, tendernessThere is a 1 by half centimeter abraded area dorsum left hand Skin: Healed surgical scar over the lateral malleolus . Redness and tenderness over the lateral malleolus. Redness extending up to the mid lateral lower leg.  Neurologic: Facial musculature symmetric. Psychiatric: Patient acts appropriately throughout our interaction. Lymphatic: No cervical or submandibular lymphadenopathy  LABS:  EKG/XRAY:   Primary read interpreted by Dr. Everlene Farrier at [facility]. Dg Ankle Complete  Right  Result Date: 06/27/2016 CLINICAL DATA:  Cellulitis. EXAM: RIGHT ANKLE - COMPLETE 3+ VIEW COMPARISON:  No recent prior. FINDINGS: Diffuse soft tissue swelling. Plate and screw fixations of the right medial and lateral malleoli noted. Hardware intact. IMPRESSION: 1.  ORIF medial lateral malleoli.  No acute bony abnormality. 2. Peripheral vascular disease. Electronically Signed   By: Marcello Moores  Register   On: 06/27/2016 14:14    .[email or web address]/PLAN: Patient will be given 1 g Rocephin today. He will continue Augmentin for now. We'll recheck again in 24 hours. Patient has hardware in his right ankle and this may be contributing to his recurrent cellulitis.I personally performed the services described in this documentation, which was scribed in my presence. The recorded information has been reviewed and is accurate. Referral made to Dr. Hart Robinsons regarding his previous surgery patient is a diabetic which complicates treatment. He is growing a gram-positive cocci from the wound on his left hand. I suspect it is the same organism left hand that his causing his right leg cellulitis.Johney [place] sideeffects, risk and benefits, and alternatives of medications d/w patient. Patient is aware that all medications have potential sideeffects and we are unable to predict every sideeffect or drug-drug interaction that may occur.  Arlyss Queen MD 06/27/2016 1:41 PM

## 2016-06-27 NOTE — Patient Instructions (Signed)
     IF you received an x-ray today, you will receive an invoice from [facility]. Please contact [facility] at [phone] with questions or concerns regarding your invoice.   IF you received labwork today, you will receive an invoice from [facility]. Please contact [facility] at [phone] with questions or concerns regarding your invoice.   Our billing staff will not be able to assist you with questions regarding bills from these companies.  You will be contacted with the lab results as soon as they are available. The fastest way to get your results is to activate your My Chart account. Instructions are located on the last page of this paperwork. If you have not heard from us regarding the results in 2 weeks, please contact this office.      

## 2016-06-28 ENCOUNTER — Ambulatory Visit (INDEPENDENT_AMBULATORY_CARE_PROVIDER_SITE_OTHER): Payer: Medicare Other | Admitting: Physician Assistant

## 2016-06-28 ENCOUNTER — Encounter: Payer: Self-pay | Admitting: Physician Assistant

## 2016-06-28 VITALS — BP 122/72 | HR 59 | Temp 97.6°F | Resp 17 | Ht 69.5 in | Wt 160.0 lb

## 2016-06-28 DIAGNOSIS — L03115 Cellulitis of right lower limb: Secondary | ICD-10-CM

## 2016-06-28 LAB — WOUND CULTURE
Gram Stain: NONE SEEN
Gram Stain: NONE SEEN
Organism ID, Bacteria: NORMAL

## 2016-06-28 NOTE — Patient Instructions (Signed)
     IF you received an x-ray today, you will receive an invoice from [facility]. Please contact [facility] at [phone] with questions or concerns regarding your invoice.   IF you received labwork today, you will receive an invoice from [facility]. Please contact [facility] at [phone] with questions or concerns regarding your invoice.   Our billing staff will not be able to assist you with questions regarding bills from these companies.  You will be contacted with the lab results as soon as they are available. The fastest way to get your results is to activate your My Chart account. Instructions are located on the last page of this paperwork. If you have not heard from us regarding the results in 2 weeks, please contact this office.      

## 2016-06-28 NOTE — Progress Notes (Signed)
06/28/2016 2:16 PM   DOB: 15-Oct-1941 / MRN: [id number]  SUBJECTIVE:  Curtis Dunn is a 75 y.o. male presenting for recheck of a left ankle cellulitis diagnosed and followed by Dr. Everlene Dunn.  Cultures taken from an area outside of the cellulitis are negative aside from normal flora.  Today he reports he is feeling better overall than he did yesterday, which was better than the day his was diagnosed.  He denies fever and chills today. He feels the redness is also improving.  He denies any difficulty with taking Augmentin.   He is allergic to acyclovir and related.   He  has a past medical history of Broken neck ([place]); Cataract; Colon cancer ([place]); Colon polyp; Diabetes mellitus; Dysrhythmia; Hyperlipidemia; Hypertension; and Paroxysmal atrial fibrillation ([place]).    He  reports that he has never smoked. He has never used smokeless tobacco. He reports that he does not drink alcohol or use drugs. He  has no sexual activity history on file. The patient  has a past surgical history that includes Appendectomy (2001); Colon resection; Elbow fracture surgery; arm surgery; Ankle surgery; Vasectomy; Cataract extraction, extracapsular (Bilateral); Inguinal hernia repair (Left, 03/13/2016); and Insertion of mesh (Left, 03/13/2016).  His family history includes Heart disease in his father.  Review of Systems  Constitutional: Negative for fever.  Gastrointestinal: Negative for nausea.  Skin: Positive for rash.  Neurological: Negative for dizziness and headaches.    The problem list and medications were reviewed and updated by myself where necessary and exist elsewhere in the encounter.   OBJECTIVE:  BP 122/72 (BP Location: Right Arm, Patient Position: Sitting, Cuff Size: Normal)   Pulse (!) 59   Temp 97.6 F (36.4 C) (Oral)   Resp 17   Ht 5' 9.5" (1.765 m)   Wt 160 lb (72.6 kg)   SpO2 97%   BMI 23.29 kg/m   Physical Exam  Cardiovascular: Normal rate and regular rhythm.   Pulmonary/Chest:  Effort normal and breath sounds normal.  Skin:       Results for orders placed or performed in visit on 06/26/16 (from the past 72 hour(s))  WOUND CULTURE     Status: None   Collection Time: 06/26/16 11:33 AM  Result Value Ref Range   Gram Stain No WBC Seen    Gram Stain No Squamous Epithelial Cells Seen    Gram Stain Rare GRAM POSITIVE COCCI IN PAIRS    Organism ID, Bacteria NORMAL SKIN FLORA    Organism ID, Bacteria Multiple Organisms Present,None Predominant    Organism ID, Bacteria No Staphylococcus aureus isolated    Organism ID, Bacteria NO GROUP A STREP (S. PYOGENES) ISOLATED   POCT CBC     Status: Abnormal   Collection Time: 06/26/16 11:38 AM  Result Value Ref Range   WBC 8.0 4.6 - 10.2 K/uL   Lymph, poc 1.5 0.6 - 3.4   POC LYMPH PERCENT 18.8 10 - 50 %L   MID (cbc) 1.0 (A) 0 - 0.9   POC MID % 11.9 0 - 12 %M   POC Granulocyte 5.5 2 - 6.9   Granulocyte percent 69.3 37 - 80 %G   RBC 4.77 4.69 - 6.13 M/uL   Hemoglobin 15.1 14.1 - 18.1 g/dL   HCT, POC 43.0 (A) 43.5 - 53.7 %   MCV 90.1 80 - 97 fL   MCH, POC 31.7 (A) 27 - 31.2 pg   MCHC 35.2 31.8 - 35.4 g/dL   RDW, POC 14.5 %  Platelet Count, POC 201 142 - 424 K/uL   MPV 7.4 0 - 99.8 fL  POCT glucose (manual entry)     Status: Abnormal   Collection Time: 06/26/16 11:40 AM  Result Value Ref Range   POC Glucose 148 (A) 70 - 99 mg/dl   Pulse Readings from Last 3 Encounters:  06/28/16 (!) 59  06/27/16 94  06/26/16 (!) 105     No results found.  ASSESSMENT AND PLAN  Curtis Dunn was seen today for follow-up.  Diagnoses and all orders for this visit:  Cellulitis of leg, right: He continues to improve. His pulse is normal today and the previous two visits have been somewhat elevated for him.  I have advised that he continue the current abx therapy and if is fails to continue improving to return on Monday for a recheck.      The patient is advised to call or return to clinic if he does not see an improvement in  symptoms, or to seek the care of the closest emergency department if he worsens with the above plan.   Curtis Dunn, MHS, PA-C [facility] 06/28/2016 2:16 PM

## 2016-06-29 NOTE — Progress Notes (Signed)
Thank you for your help Legrand Como

## 2016-07-01 ENCOUNTER — Telehealth: Payer: Self-pay | Admitting: Podiatry

## 2016-07-01 NOTE — Telephone Encounter (Signed)
Pt called and asked for you. He said he had stopped in to see Dr Prudence Davidson a couple of times but keeps missing him. He said the problem he was having turned out to be cellulitis. He would like you to call him back please.

## 2016-09-16 ENCOUNTER — Encounter: Payer: Self-pay | Admitting: Cardiology

## 2016-09-16 ENCOUNTER — Ambulatory Visit (INDEPENDENT_AMBULATORY_CARE_PROVIDER_SITE_OTHER): Payer: Medicare Other | Admitting: Cardiology

## 2016-09-16 VITALS — BP 116/72 | HR 56 | Ht 71.0 in | Wt 166.8 lb

## 2016-09-16 DIAGNOSIS — I10 Essential (primary) hypertension: Secondary | ICD-10-CM | POA: Diagnosis not present

## 2016-09-16 DIAGNOSIS — I480 Paroxysmal atrial fibrillation: Secondary | ICD-10-CM | POA: Diagnosis not present

## 2016-09-16 DIAGNOSIS — Z7901 Long term (current) use of anticoagulants: Secondary | ICD-10-CM

## 2016-09-16 MED ORDER — AMLODIPINE BESYLATE 5 MG PO TABS: 5.0000 mg | ORAL_TABLET | Freq: Every day | ORAL | 1 refills | Status: DC

## 2016-09-16 NOTE — Progress Notes (Signed)
Cardiology Office Note    Date:  09/16/2016   ID:  Curtis Dunn, DOB 1941/06/19, MRN [id number]  PCP:  Curtis Bolt, MD  Cardiologist:   [name], MD former [name] patient    History of Present Illness:  Curtis Dunn is a 75 y.o. male here for Cardiac follow-up   In April 2017 had hernia surgery, Dr. [name].  His a history of paroxysmal atrial fibrillation with nuclear stress test in 19-Feb-2005 showing no ischemia, EF 57%. He was noted to have an irregular pulse about that time and in EKG showed atrial fibrillation with heart rate of 91 bpm. He was placed on Pradaxa 150 mg twice a day. Subsequently he has switched to Xarelto. No symptoms of chest pain shortness of breath and in fact he goes to gym regularly for workouts.  Echocardiogram in 09/09/2012 showed EF of 60-65% with grade 1 diastolic dysfunction. Pulmonary artery pressure was 35 mmHg. No significant valvular abnormality.  In 02/19/2014 he had a hypoglycemic episode and blacked out, fell, injured his head and suffered a neck fracture and right wrist fracture.  Atrial fibrillation clinic saw him 07/2015 and he was in normal rhythm at that time. Options were discussed including antiarrhythmic therapy possible ablation. Elected at that time to continue current therapy.  When [name] saw him last on 11/02/15, he was back in atrial fibrillation. He states that he was really not aware of his rhythm. No symptoms.  Former Emergency planning/management officer, Futures trader AM, also used to work on Interior and spatial designer. His wife, Curtis Dunn, died in 02/19/05  He has been working out at [employer] and sometimes feels somewhat weakened periodically after workouts. He does not know when he is in atrial fibrillation. His rib fractures from February 2017 still bother him at some times.    Past Medical History:  Diagnosis Date  . Broken neck ([place])    in Feb 19, 2014  . Cataract   . Colon cancer ([place])   . Colon polyp   . Diabetes mellitus   . Dysrhythmia    a  fib  . Hyperlipidemia   . Hypertension   . Paroxysmal atrial fibrillation [facility])     Past Surgical History:  Procedure Laterality Date  . ANKLE SURGERY     right  . APPENDECTOMY  20-Feb-2000  . arm surgery     right shoulder surgery  . CATARACT EXTRACTION EXTRACAPSULAR Bilateral   . COLON RESECTION    . ELBOW FRACTURE SURGERY    . INGUINAL HERNIA REPAIR Left 03/13/2016   Procedure: LEFT INGUINAL HERNIA REPAIR WITH UNBILICAL HERNIA REPAIR;  Surgeon: [name], MD;  Location: [facility];  Service: General;  Laterality: Left;  . INSERTION OF MESH Left 03/13/2016   Procedure: INSERTION OF MESH;  Surgeon: [name], MD;  Location: [facility];  Service: General;  Laterality: Left;  [name] VASECTOMY      Current Medications: Outpatient Medications Prior to Visit  Medication Sig Dispense Refill  . Ascorbic Acid (VITAMIN C) 1000 MG tablet Take 1,000 mg by mouth daily.    . Chelated Magnesium 100 MG TABS Take 1 tablet by mouth daily.     . Cyanocobalamin (VITAMIN B 12 PO) Take 1 tablet by mouth daily. Taking 1000 daily    . Fish Oil-Krill Oil (KRILL OIL PLUS PO) Take 1,085 mg by mouth daily.    . furosemide (LASIX) 40 MG tablet Take 20-40 mg by mouth daily. Alternate 20 & 40 mg dose    .  hydrochlorothiazide (HYDRODIURIL) 25 MG tablet Take 25 mg by mouth daily.    . insulin glargine (LANTUS) 100 UNIT/ML injection Inject 15 Units into the skin 2 (two) times daily.     . insulin lispro (HUMALOG) 100 UNIT/ML injection Inject 10-12 Units into the skin 3 (three) times daily before meals. Taking 12 units  Am; 10 UNITS  noon and 12 UNITS units at  6 pm    . lovastatin (MEVACOR) 20 MG tablet Take 20 mg by mouth at bedtime.    . metoprolol succinate (TOPROL XL) 25 MG 24 hr tablet Take 1 tablet (25 mg total) by mouth daily. 90 tablet 2  . Potassium 99 MG TABS Take 1 tablet by mouth daily.     . ramipril (ALTACE) 10 MG tablet Take 10 mg by mouth daily.    . rivaroxaban (XARELTO) 20 MG TABS tablet Take 1 tablet (20 mg  total) by mouth daily. 30 tablet 9  . Tamsulosin HCl (FLOMAX) 0.4 MG CAPS Take 0.4 mg by mouth daily.     . vitamin E 400 UNIT capsule Take 400 Units by mouth daily.    . Zinc 25 MG TABS Take 25 mg by mouth daily.     [name] amLODipine (NORVASC) 10 MG tablet Take 10 mg by mouth daily.    [name] amoxicillin-clavulanate (AUGMENTIN) 875-125 MG tablet Take 1 tablet by mouth 2 (two) times daily. 20 tablet 0  . oxyCODONE-acetaminophen (PERCOCET/ROXICET) 5-325 MG tablet Take 1 tablet by mouth every 4 (four) hours as needed for severe pain. 40 tablet 0   No facility-administered medications prior to visit.      Allergies:   Review of patient's allergies indicates no active allergies.   Social History   Social History  . Marital status: Widowed    Spouse name: N/A  . Number of children: N/A  . Years of education: N/A   Social History Main Topics  . Smoking status: Never Smoker  . Smokeless tobacco: Never Used     Comment: occasional cigar  . Alcohol use No  . Drug use: No  . Sexual activity: Not Asked   Other Topics Concern  . None   Social History Narrative  . None     Family History:  The patient's family history includes Heart disease in his father.   ROS:   Please see the history of present illness.    ROS All other systems reviewed and are negative.   PHYSICAL EXAM:   VS:  BP 116/72   Pulse (!) 56   Ht 5\' 11"  (1.803 m)   Wt 166 lb 12.8 oz (75.7 kg)   BMI 23.26 kg/m    GEN: Well nourished, well developed, in no acute distress HEENT: normal Neck: no JVD, carotid bruits, or masses Cardiac: normal regular rate; no murmurs, rubs, or gallops  Respiratory:  clear to auscultation bilaterally, normal work of breathing GI: soft, nontender, nondistended, + BS MS: no deformity or atrophy, Right shoulder chronic dislocation. Skin: warm and dry, no rash, mild edema bilaterally lower extremity Neuro:  Alert and Oriented x 3, Strength and sensation are intact Psych: euthymic mood, full  affect  Wt Readings from Last 3 Encounters:  09/16/16 166 lb 12.8 oz (75.7 kg)  06/28/16 160 lb (72.6 kg)  06/27/16 161 lb (73 kg)      Studies/Labs Reviewed:   EKG:  Prior 12/25/15-sinus rhythm, nonspecific ST-T wave changes. Personally viewed  Recent Labs: 12/25/2015: ALT 26 03/06/2016: BUN 28; Creatinine, Ser 1.37;  Platelets 286; Potassium 4.1; Sodium 142 06/26/2016: Hemoglobin 15.1   Lipid Panel    Component Value Date/Time   CHOL 148 02/14/2014 0716   TRIG 106 02/14/2014 0716   HDL 48 02/14/2014 0716   CHOLHDL 3.1 02/14/2014 0716   VLDL 21 02/14/2014 0716   LDLCALC 79 02/14/2014 0716    Additional studies/ records that were reviewed today include:  Prior office notes reviewed, lab work reviewed  Echocardiogram 09/09/12: EF 60%   ASSESSMENT:    1. PAF (paroxysmal atrial fibrillation) ([place])   2. Essential hypertension   3. Chronic anticoagulation      PLAN:  In order of problems listed above:  Paroxysmal atrial fibrillation -Asymptomatic when he is in atrial fibrillation. -Overall well rate controlled, Toprol   Essential hypertension -Dr. Anda Kraft has been monitoring. Multidrug regimen as above. Stable.Because his blood pressures to been in the 110 range and sometimes he feels some weakness during exercise, I will decrease his amlodipine to 5 mg a day from 10. He has been on the 10 mg dose for quite some time. This should also help with his lower extremity edema which has been chronic.  Syncope -Discussed the possibility previously of event monitor, however he is convinced that this episode occurred because of blood sugar abnormality, hypoglycemia and was quite similar to 2015, [name] did not order monitor at that time.  Diabetes -Insulin use  Recent hospitalization in February 2017 with flail chest, ankle fracture, trauma, acute kidney injury -He fell down the stairs, hypoglycemia -911 was called however he stated his house for 2 more days until  his son got back from the beach when he brought him to the hospital his creatinine was 2.8. BUN was 81. His creatinine now is 1.37. He is on low-dose Lasix. Dr. Wilson Singer is monitoring.  Chronic anticoagulation -Continuing with Xarelto. No bleeding event.    Medication Adjustments/Labs and Tests Ordered: Current medicines are reviewed at length with the patient today.  Concerns regarding medicines are outlined above.  Medication changes, Labs and Tests ordered today are listed in the Patient Instructions below. Patient Instructions  Medication Instructions:  Decrease amlodipine to 5mg  daily  Labwork: None  Testing/Procedures: None  Follow-Up: Your physician wants you to follow-up in: 6 months with Dr Marlou Porch. (April 2018). You will receive a reminder letter in the mail two months in advance. If you don't receive a letter, please call our office to schedule the follow-up appointment.        If you need a refill on your cardiac medications before your next appointment, please call your pharmacy.      Signed, [name], MD  09/16/2016 9:11 AM    [address], [place], [place]  [id number] Phone: [phone]; Fax: [phone]

## 2016-09-16 NOTE — Patient Instructions (Signed)
Medication Instructions:  Decrease amlodipine to 5mg  daily  Labwork: None  Testing/Procedures: None  Follow-Up: Your physician wants you to follow-up in: 6 months with Dr Marlou Porch. (April 2018). You will receive a reminder letter in the mail two months in advance. If you don't receive a letter, please call our office to schedule the follow-up appointment.        If you need a refill on your cardiac medications before your next appointment, please call your pharmacy.

## 2017-03-17 ENCOUNTER — Ambulatory Visit: Payer: Medicare Other | Admitting: Cardiology

## 2017-03-18 ENCOUNTER — Encounter: Payer: Self-pay | Admitting: Cardiology

## 2017-03-18 ENCOUNTER — Telehealth: Payer: Self-pay

## 2017-03-18 ENCOUNTER — Ambulatory Visit (INDEPENDENT_AMBULATORY_CARE_PROVIDER_SITE_OTHER): Payer: PPO | Admitting: Cardiology

## 2017-03-18 VITALS — BP 106/66 | HR 58 | Ht 71.0 in | Wt 168.0 lb

## 2017-03-18 DIAGNOSIS — I10 Essential (primary) hypertension: Secondary | ICD-10-CM

## 2017-03-18 DIAGNOSIS — I480 Paroxysmal atrial fibrillation: Secondary | ICD-10-CM

## 2017-03-18 DIAGNOSIS — Z7901 Long term (current) use of anticoagulants: Secondary | ICD-10-CM

## 2017-03-18 MED ORDER — RIVAROXABAN 20 MG PO TABS: 20.0000 mg | ORAL_TABLET | Freq: Every day | ORAL | 11 refills | Status: DC

## 2017-03-18 NOTE — Telephone Encounter (Signed)
Patient requests Dr. Marlou Porch' thoughts on taking "Provicor" for his heart health. He requests a call back with his thoughts.

## 2017-03-18 NOTE — Telephone Encounter (Signed)
  This is the information I could find on what I think the patient is asking about.  Provicor Ingredient Formula: Premium Heart Support You Won't Find Anywhere Else Provinal Purified Omega-7 Oil. The superstar of the Provicor ingredients formula, Provinal purified omega-7 oil might be the most exciting development in heart health this century. Provinal purified omega-7 oil isn't like other omega fatty acids, or even other omega-7s. Those omega-7s tend to be derived from palm oil, which contains unhealthy palmitic acid. Instead, the omega-7 found in Provicor is processed from a little known fish oil. It works just as well, is healthier, and is less expensive for you. Plus, its purified form contains less than 1% unhealthy palmitic acid. That's 7 times lower than omega-7 from macadamia oil and 22% more omega-7 content than the next highest source on the market Amlamax (Phyllanthus emblica) powder. Also known as [place], this fruit powder extract is one of the foundations of the Provicor ingredient formula. This traditional botanical is believed to actually nurture the heart, blood, and circulatory system, which means it can help support a healthy heart and cardiovascular system from several different angles. Want to help keep your arteries soft, flexible, and healthy? [place] could help with that. Concerned about oxidative stress and the potential damage it can do to your heart? [place] gooseberry can help protect against oxidative stress too. In fact, when doctors artificially induced metabolic syndrome in rats, this extract "significantly reduced" their total cholesterol levels. Check out this article on [place] for more details. Coenzyme Q10. Perhaps the most commonly known of the Provicor ingredients, this antioxidant packs a punch. It fights free radicals that can contribute to heart problems. In fact, coenzyme Q10 can bring both the systolic (the top number in your blood  pressure) and the diastolic (the lower number in your blood pressure) closer to the middle of that healthy range you're shooting for in as little as 4-12 weeks, as shown in several small studies. Plus, since people with high cholesterol tend to have low levels of this amazing little chemical compound, keeping it in healthy ranges is believed to be helpful, too. Grape Seed Extract. This is a particularly exciting Provicor ingredient because the antioxidant found in grape seed extract has been shown to be more effective than the antioxidants in Vitamin C, Vitamin E, or beta carotene. Which, as you may know, is important because antioxidants can help protect blood vessel health. As for cholesterol, initial studies are promising. In 2 small, separate studies, 1 involving smokers, grape seed extract lowered cholesterol across the board. Even in animal studies, grape seed extract has been effective in supporting healthy blood pressure, which makes [place] very excited about what more it might support in humans. Phytosterols Concentrate. The last, but certainly not least, of the Provicor ingredients, phytosterols are compounds derived from plants. These compounds share a structural similarity to cholesterol so when we eat them, they do battle with the cholesterol trying to be absorbed into our system. This means phytosterols can help lower the absorption of cholesterol, sometimes by impressive amounts. So impressive, in fact, that the [employer] suggests a daily dose of phytosterols.

## 2017-03-18 NOTE — Progress Notes (Signed)
Cardiology Office Note    Date:  03/18/2017   ID:  Curtis Dunn, DOB 07-03-1941, MRN [id number]  PCP:  [name], MD  Cardiologist:   [name], MD former [name] patient    History of Present Illness:  Curtis Dunn is a 76 y.o. male here for Cardiac follow-up   In April 2017 had hernia surgery, Dr. [name].  His a history of paroxysmal atrial fibrillation with nuclear stress test in 03-05-05 showing no ischemia, EF 57%. He was noted to have an irregular pulse about that time and in EKG showed atrial fibrillation with heart rate of 91 bpm. He was placed on Pradaxa 150 mg twice a day. Subsequently he has switched to Xarelto. No symptoms of chest pain shortness of breath and in fact he goes to gym regularly for workouts.  Echocardiogram in 09/09/2012 showed EF of 60-65% with grade 1 diastolic dysfunction. Pulmonary artery pressure was 35 mmHg. No significant valvular abnormality.  In 03-05-2014 he had a hypoglycemic episode and blacked out, fell, injured his head and suffered a neck fracture and right wrist fracture.  Atrial fibrillation clinic saw him 07/2015 and he was in normal rhythm at that time. Options were discussed including antiarrhythmic therapy possible ablation. Elected at that time to continue current therapy.  When [name] saw him last on 11/02/15, he was back in atrial fibrillation. He states that he was really not aware of his rhythm. No symptoms.  Former Emergency planning/management officer, Futures trader AM, also used to work on Interior and spatial designer. His wife, [name], died in 03-05-05  He has been working out at [employer] and sometimes feels somewhat weakened periodically after workouts. He does not know when he is in atrial fibrillation. His rib fractures from February 2017 still bother him at some times.  03/18/17-overall he is doing quite well. An ankle fracture was inadvertently found after his trauma upon follow-up. He is doing physical therapy. Doing better. No atrial fibrillation  episodes. Counts. No bleeding. No chest pain.    Past Medical History:  Diagnosis Date  . Broken neck ([place])    in 03/05/14  . Cataract   . Colon cancer ([place])   . Colon polyp   . Diabetes mellitus   . Dysrhythmia    a fib  . Hyperlipidemia   . Hypertension   . Paroxysmal atrial fibrillation [facility])     Past Surgical History:  Procedure Laterality Date  . ANKLE SURGERY     right  . APPENDECTOMY  2000-03-05  . arm surgery     right shoulder surgery  . CATARACT EXTRACTION EXTRACAPSULAR Bilateral   . COLON RESECTION    . ELBOW FRACTURE SURGERY    . INGUINAL HERNIA REPAIR Left 03/13/2016   Procedure: LEFT INGUINAL HERNIA REPAIR WITH UNBILICAL HERNIA REPAIR;  Surgeon: [name], MD;  Location: [facility];  Service: General;  Laterality: Left;  . INSERTION OF MESH Left 03/13/2016   Procedure: INSERTION OF MESH;  Surgeon: [name], MD;  Location: [facility];  Service: General;  Laterality: Left;  [name] VASECTOMY      Current Medications: Outpatient Medications Prior to Visit  Medication Sig Dispense Refill  . Ascorbic Acid (VITAMIN C) 1000 MG tablet Take 1,000 mg by mouth daily.    . Chelated Magnesium 100 MG TABS Take 1 tablet by mouth daily.     . Cholecalciferol (VITAMIN D3) 1000 units CAPS Take 1,000 Units by mouth daily.    . Cyanocobalamin (VITAMIN B 12 PO)  Take 1 tablet by mouth daily. Taking 1000 daily    . Fish Oil-Krill Oil (KRILL OIL PLUS PO) Take 1,085 mg by mouth daily.    . furosemide (LASIX) 40 MG tablet Take 20-40 mg by mouth daily. Alternate 20 & 40 mg dose    . hydrochlorothiazide (HYDRODIURIL) 25 MG tablet Take 25 mg by mouth daily.    . insulin glargine (LANTUS) 100 UNIT/ML injection Inject 13 Units into the skin 2 (two) times daily.     . insulin lispro (HUMALOG) 100 UNIT/ML injection Inject 6-10 Units into the skin 3 (three) times daily before meals. Inject 10 units with breakfast, 6 units with lunch and 10 units with supper    . lovastatin (MEVACOR) 20 MG tablet Take  20 mg by mouth at bedtime.    . metoprolol succinate (TOPROL XL) 25 MG 24 hr tablet Take 1 tablet (25 mg total) by mouth daily. 90 tablet 2  . Potassium 99 MG TABS Take 1 tablet by mouth daily.     . ramipril (ALTACE) 10 MG tablet Take 10 mg by mouth daily.    . Tamsulosin HCl (FLOMAX) 0.4 MG CAPS Take 0.4 mg by mouth daily.     . vitamin E 400 UNIT capsule Take 400 Units by mouth daily.    . Zinc 25 MG TABS Take 25 mg by mouth daily.     . rivaroxaban (XARELTO) 20 MG TABS tablet Take 1 tablet (20 mg total) by mouth daily. 30 tablet 9  . amLODipine (NORVASC) 5 MG tablet Take 1 tablet (5 mg total) by mouth daily. 90 tablet 1   No facility-administered medications prior to visit.      Allergies:   Patient has no active allergies.   Social History   Social History  . Marital status: Widowed    Spouse name: N/A  . Number of children: N/A  . Years of education: N/A   Social History Main Topics  . Smoking status: Never Smoker  . Smokeless tobacco: Never Used     Comment: occasional cigar  . Alcohol use No  . Drug use: No  . Sexual activity: Not Asked   Other Topics Concern  . None   Social History Narrative  . None     Family History:  The patient's family history includes Heart disease in his father.   ROS:   Please see the history of present illness.    ROS unless specified above all other review of systems negative.   PHYSICAL EXAM:   VS:  BP 106/66   Pulse (!) 58   Ht 5\' 11"  (1.803 m)   Wt 168 lb (76.2 kg)   BMI 23.43 kg/m    GEN: Well nourished, well developed, in no acute distress  HEENT: normal  Neck: no JVD, carotid bruits, or masses Cardiac: RRR; no murmurs, rubs, or gallops,no edema  Respiratory:  clear to auscultation bilaterally, normal work of breathing GI: soft, nontender, nondistended, + BS MS: no deformity or atrophy  Skin: warm and dry, no rash Neuro:  Alert and Oriented x 3, Strength and sensation are intact Psych: euthymic mood, full  affect   Wt Readings from Last 3 Encounters:  03/18/17 168 lb (76.2 kg)  09/16/16 166 lb 12.8 oz (75.7 kg)  06/28/16 160 lb (72.6 kg)      Studies/Labs Reviewed:   EKG:  EKG ordered today 03/18/17 shows sinus bradycardia rate 58 with left anterior fascicular block and LVH. Personally viewed-prior Prior 12/25/15-sinus rhythm,  nonspecific ST-T wave changes. Personally viewed  Recent Labs: 06/26/2016: Hemoglobin 15.1   Lipid Panel    Component Value Date/Time   CHOL 148 02/14/2014 0716   TRIG 106 02/14/2014 0716   HDL 48 02/14/2014 0716   CHOLHDL 3.1 02/14/2014 0716   VLDL 21 02/14/2014 0716   LDLCALC 79 02/14/2014 0716    Additional studies/ records that were reviewed today include:  Prior office notes reviewed, lab work reviewed  Echocardiogram 09/09/12: EF 60%   ASSESSMENT:    1. PAF (paroxysmal atrial fibrillation) ([place])   2. Essential hypertension   3. Chronic anticoagulation      PLAN:  In order of problems listed above:  Paroxysmal atrial fibrillation  - Currently in sinus rhythm. Doing well. -Asymptomatic when he is in atrial fibrillation. -Overall well rate controlled, Toprol  - Xarelto  Essential hypertension -Dr. Anda Kraft has been monitoring. Multidrug regimen as above. Stable.  - His blood pressure today was 812 systolic. Since it is quite low, I will go ahead and stop his amlodipine. Previously I decreased his amlodipine from 10-5 mg. He will check his blood pressures at home and if necessary we can always resume the amlodipine.   Diabetes -Insulin use, 52 units a day. Per primary team  February 2017 with flail chest, ankle fracture, trauma, acute kidney injury -He fell down the stairs, hypoglycemia -911 was called however he stated his house for 2 more days until his son got back from the beach when he brought him to the hospital his creatinine was 2.8. BUN was 81. His creatinine now improved. He is on low-dose Lasix. Dr. Wilson Singer is  monitoring. He had an ankle fracture which is now healed as well. He is also talking with his friend from [place] who is a kinesiologist about his rhomboid muscle and doing exercises to help with this. He says that one of his ribs was scraping against the scapula.  Chronic anticoagulation -Continuing with Xarelto. No bleeding event. We have given him a handwritten prescription.  We will see him back in 6 months with APP, 12 months with me  Medication Adjustments/Labs and Tests Ordered: Current medicines are reviewed at length with the patient today.  Concerns regarding medicines are outlined above.  Medication changes, Labs and Tests ordered today are listed in the Patient Instructions below. Patient Instructions  Medication Instructions:  1) STOP AMLODIPINE  You have been given a prescription for your Xarelto.  Labwork: None  Testing/Procedures: None  Follow-Up: Your physician wants you to follow-up in: 6 months with Dr. Marlou Porch' assistant. You will receive a reminder letter in the mail two months in advance. If you don't receive a letter, please call our office to schedule the follow-up appointment.   Any Other Special Instructions Will Be Listed Below (If Applicable).     If you need a refill on your cardiac medications before your next appointment, please call your pharmacy.      Signed, [name], MD  03/18/2017 12:20 PM    [facility] [place], [place], [place]  [address] Phone: [phone]; Fax: [phone]

## 2017-03-18 NOTE — Patient Instructions (Signed)
Medication Instructions:  1) STOP AMLODIPINE  You have been given a prescription for your Xarelto.  Labwork: None  Testing/Procedures: None  Follow-Up: Your physician wants you to follow-up in: 6 months with Dr. Marlou Porch' assistant. You will receive a reminder letter in the mail two months in advance. If you don't receive a letter, please call our office to schedule the follow-up appointment.   Any Other Special Instructions Will Be Listed Below (If Applicable).     If you need a refill on your cardiac medications before your next appointment, please call your pharmacy.

## 2017-03-19 MED ORDER — HAWTHORNE BERRY 550 MG PO CAPS: 550.0000 mg | ORAL_CAPSULE | Freq: Every day | ORAL | 0 refills | Status: DC

## 2017-03-19 NOTE — Telephone Encounter (Signed)
Looks good Mark Skains, MD  

## 2017-03-19 NOTE — Telephone Encounter (Signed)
Pt aware OK to per Dr Marlou Porch.  He states will wait on this for now.

## 2017-04-06 DIAGNOSIS — Z0000 Encounter for general adult medical examination without abnormal findings: Secondary | ICD-10-CM | POA: Diagnosis not present

## 2017-04-06 DIAGNOSIS — E789 Disorder of lipoprotein metabolism, unspecified: Secondary | ICD-10-CM | POA: Diagnosis not present

## 2017-04-06 DIAGNOSIS — Z125 Encounter for screening for malignant neoplasm of prostate: Secondary | ICD-10-CM | POA: Diagnosis not present

## 2017-04-06 DIAGNOSIS — E109 Type 1 diabetes mellitus without complications: Secondary | ICD-10-CM | POA: Diagnosis not present

## 2017-04-09 DIAGNOSIS — I10 Essential (primary) hypertension: Secondary | ICD-10-CM | POA: Diagnosis not present

## 2017-04-09 DIAGNOSIS — E039 Hypothyroidism, unspecified: Secondary | ICD-10-CM | POA: Diagnosis not present

## 2017-05-07 DIAGNOSIS — H524 Presbyopia: Secondary | ICD-10-CM | POA: Diagnosis not present

## 2017-05-07 DIAGNOSIS — H26493 Other secondary cataract, bilateral: Secondary | ICD-10-CM | POA: Diagnosis not present

## 2017-05-07 DIAGNOSIS — H40013 Open angle with borderline findings, low risk, bilateral: Secondary | ICD-10-CM | POA: Diagnosis not present

## 2017-05-07 DIAGNOSIS — Z961 Presence of intraocular lens: Secondary | ICD-10-CM | POA: Diagnosis not present

## 2017-05-07 DIAGNOSIS — E103293 Type 1 diabetes mellitus with mild nonproliferative diabetic retinopathy without macular edema, bilateral: Secondary | ICD-10-CM | POA: Diagnosis not present

## 2017-05-08 DIAGNOSIS — E789 Disorder of lipoprotein metabolism, unspecified: Secondary | ICD-10-CM | POA: Diagnosis not present

## 2017-05-08 DIAGNOSIS — E109 Type 1 diabetes mellitus without complications: Secondary | ICD-10-CM | POA: Diagnosis not present

## 2017-05-14 DIAGNOSIS — E118 Type 2 diabetes mellitus with unspecified complications: Secondary | ICD-10-CM | POA: Diagnosis not present

## 2017-05-19 DIAGNOSIS — C180 Malignant neoplasm of cecum: Secondary | ICD-10-CM | POA: Diagnosis not present

## 2017-05-22 ENCOUNTER — Other Ambulatory Visit: Payer: Self-pay | Admitting: Cardiology

## 2017-05-22 DIAGNOSIS — I480 Paroxysmal atrial fibrillation: Secondary | ICD-10-CM

## 2017-05-22 MED ORDER — METOPROLOL SUCCINATE ER 25 MG PO TB24: 25.0000 mg | ORAL_TABLET | Freq: Every day | ORAL | 2 refills | Status: DC

## 2017-05-28 DIAGNOSIS — M546 Pain in thoracic spine: Secondary | ICD-10-CM | POA: Diagnosis not present

## 2017-06-09 DIAGNOSIS — M546 Pain in thoracic spine: Secondary | ICD-10-CM | POA: Diagnosis not present

## 2017-06-18 DIAGNOSIS — M546 Pain in thoracic spine: Secondary | ICD-10-CM | POA: Diagnosis not present

## 2017-06-25 DIAGNOSIS — M546 Pain in thoracic spine: Secondary | ICD-10-CM | POA: Diagnosis not present

## 2017-07-02 DIAGNOSIS — M546 Pain in thoracic spine: Secondary | ICD-10-CM | POA: Diagnosis not present

## 2017-07-07 DIAGNOSIS — M546 Pain in thoracic spine: Secondary | ICD-10-CM | POA: Diagnosis not present

## 2017-07-14 DIAGNOSIS — M546 Pain in thoracic spine: Secondary | ICD-10-CM | POA: Diagnosis not present

## 2017-07-16 DIAGNOSIS — M546 Pain in thoracic spine: Secondary | ICD-10-CM | POA: Diagnosis not present

## 2017-07-21 DIAGNOSIS — I10 Essential (primary) hypertension: Secondary | ICD-10-CM | POA: Diagnosis not present

## 2017-07-21 DIAGNOSIS — Z6822 Body mass index (BMI) 22.0-22.9, adult: Secondary | ICD-10-CM | POA: Diagnosis not present

## 2017-07-21 DIAGNOSIS — M898X1 Other specified disorders of bone, shoulder: Secondary | ICD-10-CM | POA: Diagnosis not present

## 2017-07-21 DIAGNOSIS — E1159 Type 2 diabetes mellitus with other circulatory complications: Secondary | ICD-10-CM | POA: Diagnosis not present

## 2017-07-21 DIAGNOSIS — M546 Pain in thoracic spine: Secondary | ICD-10-CM | POA: Diagnosis not present

## 2017-07-21 DIAGNOSIS — W19XXXA Unspecified fall, initial encounter: Secondary | ICD-10-CM | POA: Diagnosis not present

## 2017-07-28 DIAGNOSIS — M546 Pain in thoracic spine: Secondary | ICD-10-CM | POA: Diagnosis not present

## 2017-08-04 DIAGNOSIS — M546 Pain in thoracic spine: Secondary | ICD-10-CM | POA: Diagnosis not present

## 2017-08-11 DIAGNOSIS — M546 Pain in thoracic spine: Secondary | ICD-10-CM | POA: Diagnosis not present

## 2017-08-18 DIAGNOSIS — M546 Pain in thoracic spine: Secondary | ICD-10-CM | POA: Diagnosis not present

## 2017-09-08 DIAGNOSIS — M898X1 Other specified disorders of bone, shoulder: Secondary | ICD-10-CM | POA: Diagnosis not present

## 2017-09-08 DIAGNOSIS — N08 Glomerular disorders in diseases classified elsewhere: Secondary | ICD-10-CM | POA: Diagnosis not present

## 2017-09-08 DIAGNOSIS — E1029 Type 1 diabetes mellitus with other diabetic kidney complication: Secondary | ICD-10-CM | POA: Diagnosis not present

## 2017-09-08 DIAGNOSIS — C180 Malignant neoplasm of cecum: Secondary | ICD-10-CM | POA: Diagnosis not present

## 2017-09-08 DIAGNOSIS — Z6822 Body mass index (BMI) 22.0-22.9, adult: Secondary | ICD-10-CM | POA: Diagnosis not present

## 2017-09-08 DIAGNOSIS — I10 Essential (primary) hypertension: Secondary | ICD-10-CM | POA: Diagnosis not present

## 2017-09-08 DIAGNOSIS — I5189 Other ill-defined heart diseases: Secondary | ICD-10-CM | POA: Diagnosis not present

## 2017-09-08 DIAGNOSIS — E7849 Other hyperlipidemia: Secondary | ICD-10-CM | POA: Diagnosis not present

## 2017-09-08 DIAGNOSIS — E038 Other specified hypothyroidism: Secondary | ICD-10-CM | POA: Diagnosis not present

## 2017-09-08 DIAGNOSIS — I480 Paroxysmal atrial fibrillation: Secondary | ICD-10-CM | POA: Diagnosis not present

## 2017-09-08 DIAGNOSIS — E1159 Type 2 diabetes mellitus with other circulatory complications: Secondary | ICD-10-CM | POA: Diagnosis not present

## 2017-09-24 DIAGNOSIS — D1801 Hemangioma of skin and subcutaneous tissue: Secondary | ICD-10-CM | POA: Diagnosis not present

## 2017-09-24 DIAGNOSIS — L821 Other seborrheic keratosis: Secondary | ICD-10-CM | POA: Diagnosis not present

## 2017-09-24 DIAGNOSIS — D485 Neoplasm of uncertain behavior of skin: Secondary | ICD-10-CM | POA: Diagnosis not present

## 2017-09-24 DIAGNOSIS — Z85828 Personal history of other malignant neoplasm of skin: Secondary | ICD-10-CM | POA: Diagnosis not present

## 2017-09-24 DIAGNOSIS — C44311 Basal cell carcinoma of skin of nose: Secondary | ICD-10-CM | POA: Diagnosis not present

## 2017-09-24 DIAGNOSIS — L570 Actinic keratosis: Secondary | ICD-10-CM | POA: Diagnosis not present

## 2017-09-24 DIAGNOSIS — L723 Sebaceous cyst: Secondary | ICD-10-CM | POA: Diagnosis not present

## 2017-09-30 ENCOUNTER — Encounter: Payer: Self-pay | Admitting: Cardiology

## 2017-09-30 ENCOUNTER — Ambulatory Visit: Payer: PPO | Admitting: Cardiology

## 2017-09-30 VITALS — BP 146/68 | HR 58 | Resp 16 | Ht 71.0 in | Wt 171.0 lb

## 2017-09-30 DIAGNOSIS — Z7901 Long term (current) use of anticoagulants: Secondary | ICD-10-CM

## 2017-09-30 DIAGNOSIS — I10 Essential (primary) hypertension: Secondary | ICD-10-CM | POA: Diagnosis not present

## 2017-09-30 DIAGNOSIS — I480 Paroxysmal atrial fibrillation: Secondary | ICD-10-CM | POA: Diagnosis not present

## 2017-09-30 NOTE — Patient Instructions (Signed)

## 2017-09-30 NOTE — Progress Notes (Signed)
Cardiology Office Note    Date:  09/30/2017   ID:  Curtis Dunn, DOB [birth year], MRN [id number]  PCP:  Curtis Bowen, MD  Cardiologist:   Curtis Furbish, MD former Dr. Mare Dunn patient    History of Present Illness:  Curtis Dunn is a 76 y.o. male here for Cardiac follow-up   In April 2017 had hernia surgery, Dr. Georgette Dunn.  His a history of paroxysmal atrial fibrillation with nuclear stress test in 2005/02/20 showing no ischemia, EF 57%. He was noted to have an irregular pulse about that time and in EKG showed atrial fibrillation with heart rate of 91 bpm. He was placed on Pradaxa 150 mg twice a day. Subsequently he has switched to Xarelto. No symptoms of chest pain shortness of breath and in fact he goes to gym regularly for workouts.  Echocardiogram in 09/09/2012 showed EF of 60-65% with grade 1 diastolic dysfunction. Pulmonary artery pressure was 35 mmHg. No significant valvular abnormality.  In Feb 20, 2014 he had a hypoglycemic episode and blacked out, fell, injured his head and suffered a neck fracture and right wrist fracture.  Atrial fibrillation clinic saw him 07/2015 and he was in normal rhythm at that time. Options were discussed including antiarrhythmic therapy possible ablation. Elected at that time to continue current therapy.  When Dr. Mare Dunn saw him last on 11/02/15, he was back in atrial fibrillation. He states that he was really not aware of his rhythm. No symptoms.  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 His wife, Curtis Dunn, died in 2005-02-20  He has been working out at [employer] and sometimes feels somewhat weakened periodically after workouts. He does not know when he is in atrial fibrillation. His rib fractures from February 2017 still bother him at some times.  03/18/17-overall he is doing quite well. An ankle fracture was inadvertently found after his trauma upon follow-up. He is doing physical therapy. Doing better. No atrial  fibrillation episodes. Counts. No bleeding. No chest pain.  09/30/17-seems to be quite stable.  He told me about more of his musculoskeletal issues.  Had a fall when [place] and fell on his right scapula.  No fracture.  He experienced dry needling in the past.  Still active in the gym.  He does not feel any evidence of paroxysmal atrial fibrillation.  No bleeding.  No chest pain.    Past Medical History:  Diagnosis Date  . Broken neck ([place])    in 2014/02/20  . Cataract   . Colon cancer ([place])   . Colon polyp   . Diabetes mellitus   . Dysrhythmia    a fib  . Hyperlipidemia   . Hypertension   . Paroxysmal atrial fibrillation [facility])     Past Surgical History:  Procedure Laterality Date  . ANKLE SURGERY     right  . APPENDECTOMY  2000-02-21  . arm surgery     right shoulder surgery  . CATARACT EXTRACTION EXTRACAPSULAR Bilateral   . COLON RESECTION    . ELBOW FRACTURE SURGERY    . VASECTOMY      Current Medications: Outpatient Medications Prior to Visit  Medication Sig Dispense Refill  . Ascorbic Acid (VITAMIN C) 1000 MG tablet Take 1,000 mg by mouth daily.    . Chelated Magnesium 100 MG TABS Take 1 tablet by mouth daily.     . Cholecalciferol (VITAMIN D3) 1000 units CAPS Take 1,000 Units by mouth  daily.    . Cyanocobalamin (VITAMIN B 12 PO) Take 1 tablet by mouth daily. Taking 1000 daily    . Fish Oil-Krill Oil (KRILL OIL PLUS PO) Take 1,085 mg by mouth daily.    [name] Hawthorne Berry 550 MG CAPS Take 550 mg by mouth daily.  0  . hydrochlorothiazide (HYDRODIURIL) 25 MG tablet Take 25 mg by mouth daily.    . insulin glargine (LANTUS) 100 UNIT/ML injection Inject 13 Units into the skin 2 (two) times daily.     [name] lovastatin (MEVACOR) 20 MG tablet Take 20 mg by mouth at bedtime.    . metoprolol succinate (TOPROL XL) 25 MG 24 hr tablet Take 1 tablet (25 mg total) by mouth daily. 90 tablet 2  . ramipril (ALTACE) 10 MG tablet Take 10 mg by mouth daily.    . rivaroxaban  (XARELTO) 20 MG TABS tablet Take 1 tablet (20 mg total) by mouth daily. 30 tablet 11  . Tamsulosin HCl (FLOMAX) 0.4 MG CAPS Take 0.4 mg by mouth daily.     . vitamin E 400 UNIT capsule Take 400 Units by mouth daily.    . Zinc 25 MG TABS Take 25 mg by mouth daily.     . insulin lispro (HUMALOG) 100 UNIT/ML injection Inject 6-10 Units into the skin 3 (three) times daily before meals. Inject 10 units with breakfast, 6 units with lunch and 10 units with supper    . furosemide (LASIX) 40 MG tablet Take 20-40 mg by mouth daily. Alternate 20 & 40 mg dose    . Potassium 99 MG TABS Take 1 tablet by mouth daily.      No facility-administered medications prior to visit.      Allergies:   Patient has no known allergies.   Social History   Socioeconomic History  . Marital status: Widowed    Spouse name: None  . Number of children: None  . Years of education: None  . Highest education level: None  Social Needs  . Financial resource strain: None  . Food insecurity - worry: None  . Food insecurity - inability: None  . Transportation needs - medical: None  . Transportation needs - non-medical: None  Occupational History  . None  Tobacco Use  . Smoking status: Never Smoker  . Smokeless tobacco: Never Used  . Tobacco comment: occasional cigar  Substance and Sexual Activity  . Alcohol use: No  . Drug use: No  . Sexual activity: None  Other Topics Concern  . None  Social History Narrative  . None     Family History:  The patient's family history includes Heart disease in his father.   ROS:   Please see the history of present illness.   Unless specified above all other review of systems negative. ROS   PHYSICAL EXAM:   VS:  BP (!) 146/68   Pulse (!) 58   Resp 16   Ht 5\' 11"  (1.803 m)   Wt 171 lb (77.6 kg)   SpO2 98%   BMI 23.85 kg/m    GEN: Well nourished, well developed, in no acute distress  HEENT: normal  Neck: no JVD, carotid bruits, or masses Cardiac: RRR; no murmurs,  rubs, or gallops,no edema  Respiratory:  clear to auscultation bilaterally, normal work of breathing GI: soft, nontender, nondistended, + BS MS: no deformity or atrophy  Skin: warm and dry, no rash Neuro:  Alert and Oriented x 3, Strength and sensation are intact Psych: euthymic mood, full  affect   Wt Readings from Last 3 Encounters:  09/30/17 171 lb (77.6 kg)  03/18/17 168 lb (76.2 kg)  09/16/16 166 lb 12.8 oz (75.7 kg)      Studies/Labs Reviewed:   EKG:  EKG ordered today 03/18/17 shows sinus bradycardia rate 58 with left anterior fascicular block and LVH. Personally viewed-prior Prior 12/25/15-sinus rhythm, nonspecific ST-T wave changes. Personally viewed  Recent Labs: No results found for requested labs within last 8760 hours.   Lipid Panel    Component Value Date/Time   CHOL 148 02/14/2014 0716   TRIG 106 02/14/2014 0716   HDL 48 02/14/2014 0716   CHOLHDL 3.1 02/14/2014 0716   VLDL 21 02/14/2014 0716   LDLCALC 79 02/14/2014 0716    Additional studies/ records that were reviewed today include:  Prior office notes reviewed, lab work reviewed  Echocardiogram 09/09/12: EF 60%   ASSESSMENT:    1. PAF (paroxysmal atrial fibrillation) ([place])   2. Chronic anticoagulation   3. Essential hypertension      PLAN:  In order of problems listed above:  Paroxysmal atrial fibrillation  - Currently in sinus rhythm. Doing well. -Asymptomatic when he is in atrial fibrillation.  This was confirmed previously by Dr. Mare Dunn. -Overall well rate controlled, Toprol  - Xarelto, no changes, no bleeding, hemoglobin 15, creatinine 1.4  Essential hypertension -Dr. Carrolyn Meiers, (previously Anda Kraft) has been monitoring. Multidrug regimen as above. Stable.   Diabetes -Insulin use  February 2017 with flail chest, ankle fracture, trauma, acute kidney injury -He fell down the stairs, hypoglycemia -911 was called however he stated his house for 2 more days until his son got back  from the beach when he brought him to the hospital his creatinine was 2.8. BUN was 81. His creatinine now improved.  - no longer takes lasix He had an ankle fracture which is now healed as well. He is also talking with his friend from [place] who is a kinesiologist about his rhomboid muscle and doing exercises to help with this. He says that one of his ribs was scraping against the scapula.  Chronic anticoagulation -Continuing with Xarelto. No bleeding event.   We will see him back in 6 months   Medication Adjustments/Labs and Tests Ordered: Current medicines are reviewed at length with the patient today.  Concerns regarding medicines are outlined above.  Medication changes, Labs and Tests ordered today are listed in the Patient Instructions below. Patient Instructions  Medication Instructions:  The current medical regimen is effective;  continue present plan and medications.  Follow-Up: Follow up in 6 months with Dr. Marlou Porch.  You will receive a letter in the mail 2 months before you are due.  Please call us when you receive this letter to schedule your follow up appointment.  If you need a refill on your cardiac medications before your next appointment, please call your pharmacy.  Thank you for choosing [facility]!!        Signed, Curtis Furbish, MD  09/30/2017 5:26 PM    [place], [place], [place]  [address] Phone: [phone]; Fax: [phone]

## 2017-10-21 DIAGNOSIS — Z85828 Personal history of other malignant neoplasm of skin: Secondary | ICD-10-CM | POA: Diagnosis not present

## 2017-10-28 DIAGNOSIS — R3912 Poor urinary stream: Secondary | ICD-10-CM | POA: Diagnosis not present

## 2017-10-28 DIAGNOSIS — N401 Enlarged prostate with lower urinary tract symptoms: Secondary | ICD-10-CM | POA: Diagnosis not present

## 2017-10-28 DIAGNOSIS — N5201 Erectile dysfunction due to arterial insufficiency: Secondary | ICD-10-CM | POA: Diagnosis not present

## 2017-10-30 DIAGNOSIS — H524 Presbyopia: Secondary | ICD-10-CM | POA: Diagnosis not present

## 2017-10-30 DIAGNOSIS — Z961 Presence of intraocular lens: Secondary | ICD-10-CM | POA: Diagnosis not present

## 2017-10-30 DIAGNOSIS — H40013 Open angle with borderline findings, low risk, bilateral: Secondary | ICD-10-CM | POA: Diagnosis not present

## 2017-10-30 DIAGNOSIS — E103293 Type 1 diabetes mellitus with mild nonproliferative diabetic retinopathy without macular edema, bilateral: Secondary | ICD-10-CM | POA: Diagnosis not present

## 2017-11-09 DIAGNOSIS — Z23 Encounter for immunization: Secondary | ICD-10-CM | POA: Diagnosis not present

## 2017-11-09 DIAGNOSIS — W5501XA Bitten by cat, initial encounter: Secondary | ICD-10-CM | POA: Diagnosis not present

## 2017-11-09 DIAGNOSIS — S61452A Open bite of left hand, initial encounter: Secondary | ICD-10-CM | POA: Diagnosis not present

## 2017-12-01 DIAGNOSIS — I10 Essential (primary) hypertension: Secondary | ICD-10-CM | POA: Diagnosis not present

## 2017-12-01 DIAGNOSIS — I480 Paroxysmal atrial fibrillation: Secondary | ICD-10-CM | POA: Diagnosis not present

## 2017-12-01 DIAGNOSIS — E7849 Other hyperlipidemia: Secondary | ICD-10-CM | POA: Diagnosis not present

## 2017-12-01 DIAGNOSIS — Z1389 Encounter for screening for other disorder: Secondary | ICD-10-CM | POA: Diagnosis not present

## 2017-12-01 DIAGNOSIS — N08 Glomerular disorders in diseases classified elsewhere: Secondary | ICD-10-CM | POA: Diagnosis not present

## 2017-12-01 DIAGNOSIS — E038 Other specified hypothyroidism: Secondary | ICD-10-CM | POA: Diagnosis not present

## 2017-12-01 DIAGNOSIS — Z6822 Body mass index (BMI) 22.0-22.9, adult: Secondary | ICD-10-CM | POA: Diagnosis not present

## 2017-12-01 DIAGNOSIS — Z7901 Long term (current) use of anticoagulants: Secondary | ICD-10-CM | POA: Diagnosis not present

## 2017-12-01 DIAGNOSIS — C180 Malignant neoplasm of cecum: Secondary | ICD-10-CM | POA: Diagnosis not present

## 2017-12-01 DIAGNOSIS — I5189 Other ill-defined heart diseases: Secondary | ICD-10-CM | POA: Diagnosis not present

## 2017-12-01 DIAGNOSIS — E1029 Type 1 diabetes mellitus with other diabetic kidney complication: Secondary | ICD-10-CM | POA: Diagnosis not present

## 2018-04-05 ENCOUNTER — Encounter: Payer: Self-pay | Admitting: Cardiology

## 2018-04-06 ENCOUNTER — Ambulatory Visit: Payer: PPO | Admitting: Cardiology

## 2018-04-17 ENCOUNTER — Other Ambulatory Visit: Payer: Self-pay | Admitting: Cardiology

## 2018-04-19 DIAGNOSIS — I739 Peripheral vascular disease, unspecified: Secondary | ICD-10-CM | POA: Diagnosis not present

## 2018-04-19 DIAGNOSIS — N183 Chronic kidney disease, stage 3 (moderate): Secondary | ICD-10-CM | POA: Diagnosis not present

## 2018-04-19 DIAGNOSIS — N401 Enlarged prostate with lower urinary tract symptoms: Secondary | ICD-10-CM | POA: Diagnosis not present

## 2018-04-19 DIAGNOSIS — I10 Essential (primary) hypertension: Secondary | ICD-10-CM | POA: Diagnosis not present

## 2018-04-19 DIAGNOSIS — E7849 Other hyperlipidemia: Secondary | ICD-10-CM | POA: Diagnosis not present

## 2018-04-19 DIAGNOSIS — C180 Malignant neoplasm of cecum: Secondary | ICD-10-CM | POA: Diagnosis not present

## 2018-04-19 DIAGNOSIS — Z6822 Body mass index (BMI) 22.0-22.9, adult: Secondary | ICD-10-CM | POA: Diagnosis not present

## 2018-04-19 DIAGNOSIS — E038 Other specified hypothyroidism: Secondary | ICD-10-CM | POA: Diagnosis not present

## 2018-04-19 DIAGNOSIS — E1029 Type 1 diabetes mellitus with other diabetic kidney complication: Secondary | ICD-10-CM | POA: Diagnosis not present

## 2018-04-19 DIAGNOSIS — I480 Paroxysmal atrial fibrillation: Secondary | ICD-10-CM | POA: Diagnosis not present

## 2018-04-20 NOTE — Telephone Encounter (Addendum)
Patient called back regarding speaking to [name] about having his labs drawn on yesterday. Pt states he went to PCP office on 04/19/18 and he is awaiting labs to be uploaded to patient portal. He then called back and stated he had his Urine Creatinine results. Doroteo Bradford RN spoke with pt and informed him that we needed a lab draw Creatinine and that we would call his MD office regarding this so we can obtain a faxed copy to place in our records. Called Dr. Baldwin Crown office and had to leave a message for Lattie Haw regarding obtaining a copy of the labs he had done today. Will await a call back.  Xarelto 47m refill received and pt is 77yrs old, wt-77.6kg, last seen by Dr. SMarlou Porchon 09/30/17, Crea-1.30 on today [facility].031[facility]; after multiple calls the labs were faxed over. Returned call to pt and had to leave a message for pt to call back. Will send in refill to requested pharmacy as doseage criteria has been met.

## 2018-04-21 ENCOUNTER — Telehealth: Payer: Self-pay | Admitting: *Deleted

## 2018-04-21 NOTE — Telephone Encounter (Signed)
Spoke with pt and instructed that his SrCr  of yesterday was 1.3 so that made his creatinine clearance  51.04 and that we refilled Xarelto 20 mg daily as that is the correct dose for him  and he states understanding  Age 77 years WT 74.18.kg SrCr 1.3 04/20/2018 Saw Dr Marlou Porch on 09/30/2018  Scheduled for an appt on 04/27/2018 CrCl 51.04

## 2018-04-23 NOTE — Telephone Encounter (Signed)
With weight 74.18 kg  SrCr 1.3 CrCl 50.72  being so close to 51 spoke with Tana Coast PharmD and she states to continue the Xarelto 20 mg daily

## 2018-04-27 ENCOUNTER — Ambulatory Visit: Payer: PPO | Admitting: Cardiology

## 2018-04-27 ENCOUNTER — Encounter: Payer: Self-pay | Admitting: Cardiology

## 2018-04-27 VITALS — BP 152/82 | HR 75 | Ht 71.0 in | Wt 165.2 lb

## 2018-04-27 DIAGNOSIS — I480 Paroxysmal atrial fibrillation: Secondary | ICD-10-CM | POA: Diagnosis not present

## 2018-04-27 DIAGNOSIS — I10 Essential (primary) hypertension: Secondary | ICD-10-CM | POA: Diagnosis not present

## 2018-04-27 DIAGNOSIS — Z7901 Long term (current) use of anticoagulants: Secondary | ICD-10-CM

## 2018-04-27 NOTE — Progress Notes (Signed)
Cardiology Office Note    Date:  04/27/2018   ID:  Curtis Dunn, DOB 1941-05-03, MRN [id number]  PCP:  Reynold Bowen, MD  Cardiologist:   [name], MD former [name] patient    History of Present Illness:  Curtis Dunn is a 77 y.o. male here for cardiac follow-up   In April 2017 had hernia surgery, Dr. [name].  His a history of paroxysmal atrial fibrillation with nuclear stress test in 02/23/2005 showing no ischemia, EF 57%. He was noted to have an irregular pulse about that time and in EKG showed atrial fibrillation with heart rate of 91 bpm. He was placed on Pradaxa 150 mg twice a day. Subsequently he has switched to Xarelto. No symptoms of chest pain shortness of breath and in fact he goes to gym regularly for workouts.  Echocardiogram in 09/09/2012 showed EF of 60-65% with grade 1 diastolic dysfunction. Pulmonary artery pressure was 35 mmHg. No significant valvular abnormality.  In 2014-02-23 he had a hypoglycemic episode and blacked out, fell, injured his head and suffered a neck fracture and right wrist fracture.  Atrial fibrillation clinic saw him 07/2015 and he was in normal rhythm at that time. Options were discussed including antiarrhythmic therapy possible ablation. Elected at that time to continue current therapy.  When [name] saw him last on 11/02/15, he was back in atrial fibrillation. He states that he was really not aware of his rhythm. No symptoms.  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 His wife, [name], died in 23-Feb-2005  He has been working out at [employer] and sometimes feels somewhat weakened periodically after workouts. He does not know when he is in atrial fibrillation. His rib fractures from February 2017 still bother him at some times.  03/18/17-overall he is doing quite well. An ankle fracture was inadvertently found after his trauma upon follow-up. He is doing physical therapy. Doing better. No atrial  fibrillation episodes. Counts. No bleeding. No chest pain.  09/30/17-seems to be quite stable.  He told me about more of his musculoskeletal issues.  Had a fall when he slipped [place] a ranch and fell on his right scapula.  No fracture.  He experienced dry needling in the past.  Still active in the gym.  He does not feel any evidence of paroxysmal atrial fibrillation.  No bleeding.  No chest pain.  04/27/2018- overall been doing quite well.  No significant chest pain fevers chills nausea vomiting bleeding.  No evidence of paroxysmal A. fib.  He did state that he was taking a prostate supplement.  I asked him to make sure that Dr. Forde Dandy new.  He was worried about side effects seen on the prostate medication previously prescribed.   Past Medical History:  Diagnosis Date  . Broken neck ([place])    in 02/23/2014  . Cataract   . Colon cancer ([place])   . Colon polyp   . Diabetes mellitus   . Dysrhythmia    a fib  . Hyperlipidemia   . Hypertension   . Paroxysmal atrial fibrillation [facility])     Past Surgical History:  Procedure Laterality Date  . ANKLE SURGERY     right  . APPENDECTOMY  2000/02/24  . arm surgery     right shoulder surgery  . CATARACT EXTRACTION EXTRACAPSULAR Bilateral   . COLON RESECTION    . ELBOW FRACTURE SURGERY    . INGUINAL HERNIA REPAIR Left 03/13/2016  Procedure: LEFT INGUINAL HERNIA REPAIR WITH UNBILICAL HERNIA REPAIR;  Surgeon: [name], MD;  Location: [facility];  Service: General;  Laterality: Left;  . INSERTION OF MESH Left 03/13/2016   Procedure: INSERTION OF MESH;  Surgeon: [name], MD;  Location: [facility];  Service: General;  Laterality: Left;  [name] VASECTOMY      Current Medications: Outpatient Medications Prior to Visit  Medication Sig Dispense Refill  . Ascorbic Acid (VITAMIN C) 1000 MG tablet Take 1,000 mg by mouth daily.    . Chelated Magnesium 100 MG TABS Take 1 tablet by mouth daily.     . Cholecalciferol (VITAMIN D3) 1000 units CAPS Take 1,000 Units by mouth  daily.    . Cyanocobalamin (VITAMIN B 12 PO) Take 1 tablet by mouth daily. Taking 1000 daily    . Fish Oil-Krill Oil (KRILL OIL PLUS PO) Take 1,085 mg by mouth daily.    [name] Hawthorne Berry 550 MG CAPS Take 550 mg by mouth daily.  0  . hydrochlorothiazide (HYDRODIURIL) 25 MG tablet Take 25 mg by mouth daily.    . insulin aspart (NOVOLOG) 100 UNIT/ML injection Inject into the skin 3 (three) times daily before meals. Am 10 units, lunch 6 units, and dinner 7 units    . insulin glargine (LANTUS) 100 UNIT/ML injection Inject 13 Units into the skin 2 (two) times daily.     [name] lovastatin (MEVACOR) 20 MG tablet Take 20 mg by mouth at bedtime.    . metoprolol succinate (TOPROL XL) 25 MG 24 hr tablet Take 1 tablet (25 mg total) by mouth daily. 90 tablet 2  . Potassium 99 MG TABS Take 1 tablet by mouth daily.     . ramipril (ALTACE) 10 MG tablet Take 10 mg by mouth daily.    . Tamsulosin HCl (FLOMAX) 0.4 MG CAPS Take 0.4 mg by mouth daily.     . vitamin E 400 UNIT capsule Take 400 Units by mouth daily.    Alveda Reasons 20 MG TABS tablet TAKE 1 TABLET BY MOUTH ONCE DAILY 30 tablet 6  . Zinc 25 MG TABS Take 25 mg by mouth daily.     . furosemide (LASIX) 40 MG tablet Take 20-40 mg by mouth daily. Alternate 20 & 40 mg dose     No facility-administered medications prior to visit.      Allergies:   Patient has no known allergies.   Social History   Socioeconomic History  . Marital status: Widowed    Spouse name: Not on file  . Number of children: Not on file  . Years of education: Not on file  . Highest education level: Not on file  Occupational History  . Not on file  Social Needs  . Financial resource strain: Not on file  . Food insecurity:    Worry: Not on file    Inability: Not on file  . Transportation needs:    Medical: Not on file    Non-medical: Not on file  Tobacco Use  . Smoking status: Never Smoker  . Smokeless tobacco: Never Used  . Tobacco comment: occasional cigar  Substance and  Sexual Activity  . Alcohol use: No  . Drug use: No  . Sexual activity: Not on file  Lifestyle  . Physical activity:    Days per week: Not on file    Minutes per session: Not on file  . Stress: Not on file  Relationships  . Social connections:    Talks on phone: Not  on file    Gets together: Not on file    Attends religious service: Not on file    Active member of club or organization: Not on file    Attends meetings of clubs or organizations: Not on file    Relationship status: Not on file  Other Topics Concern  . Not on file  Social History Narrative  . Not on file     Family History:  The patient's family history includes Heart disease in his father.   ROS:   Please see the history of present illness.    Review of Systems  All other systems reviewed and are negative.    PHYSICAL EXAM:   VS:  BP (!) 152/82   Pulse 75   Ht 5\' 11"  (1.803 m)   Wt 165 lb 3.2 oz (74.9 kg)   BMI 23.04 kg/m    GEN: Well nourished, well developed, in no acute distress  HEENT: normal  Neck: no JVD, carotid bruits, or masses Cardiac: RRR; no murmurs, rubs, or gallops,no edema  Respiratory:  clear to auscultation bilaterally, normal work of breathing GI: soft, nontender, nondistended, + BS MS: no deformity or atrophy  Skin: warm and dry, no rash Neuro:  Alert and Oriented x 3, Strength and sensation are intact Psych: euthymic mood, full affect    Wt Readings from Last 3 Encounters:  04/27/18 165 lb 3.2 oz (74.9 kg)  09/30/17 171 lb (77.6 kg)  03/18/17 168 lb (76.2 kg)      Studies/Labs Reviewed:   EKG:  EKG ordered today 04/27/18 - NSR, PAT. 03/18/17 shows sinus bradycardia rate 58 with left anterior fascicular block and LVH. Personally viewed-prior Prior 12/25/15-sinus rhythm, nonspecific ST-T wave changes. Personally viewed  Recent Labs: No results found for requested labs within last 8760 hours.   Lipid Panel    Component Value Date/Time   CHOL 148 02/14/2014 0716   TRIG 106  02/14/2014 0716   HDL 48 02/14/2014 0716   CHOLHDL 3.1 02/14/2014 0716   VLDL 21 02/14/2014 0716   LDLCALC 79 02/14/2014 0716    Additional studies/ records that were reviewed today include:  Prior office notes reviewed, lab work reviewed  Echocardiogram 09/09/12: EF 60%   ASSESSMENT:    1. PAF (paroxysmal atrial fibrillation) ([place])   2. Chronic anticoagulation   3. Essential hypertension      PLAN:  In order of problems listed above:  Paroxysmal atrial fibrillation  - Currently in sinus rhythm. Doing well. -Asymptomatic when he is in atrial fibrillation.  This was confirmed previously by [name]. -Overall well rate controlled, Toprol.  No changes made.  - Xarelto, no changes.  Well.  Essential hypertension -Dr. Carrolyn Meiers, (previously Anda Kraft) has been monitoring. Multidrug regimen as above. Stable.  Overall mildly elevated today.  Continue to monitor.  No longer needing Lasix.  Diabetes -Insulin use, Dr. Forde Dandy.  February 2017 with flail chest, ankle fracture, trauma, acute kidney injury -He fell down the stairs, hypoglycemia -911 was called however he stated his house for 2 more days until his son got back from the beach when he brought him to the hospital his creatinine was 2.8. BUN was 81. His creatinine now improved.  - no longer takes lasix He had an ankle fracture which is now healed as well. He is also talking with his friend from [place] who is a kinesiologist about his rhomboid muscle and doing exercises to help with this. He says that one of his  ribs was scraping against the scapula.  Overall feeling much better  Chronic anticoagulation -Continuing with Xarelto 20, CrCl 51.  Continue current dosing.  If he consistently has a creatinine clearance less than 50 would need to dose adjust to 15 mg.  No bleeding event.  Last creatinine 1.3, hemoglobin 14.8 hemoglobin A1c 6.0 LDL 84  We will see him back in 6 months with Cecille Rubin, 12 months with  me  Medication Adjustments/Labs and Tests Ordered: Current medicines are reviewed at length with the patient today.  Concerns regarding medicines are outlined above.  Medication changes, Labs and Tests ordered today are listed in the Patient Instructions below. Patient Instructions  Medication Instructions:  The current medical regimen is effective;  continue present plan and medications.  Follow-Up: Follow up in 6 months with Truitt Merle, NP.   Follow up in 1 year with Dr. Marlou Porch.  You will receive a letter in the mail 2 months before you are due.  Please call us when you receive this letter to schedule your follow up appointment.  If you need a refill on your cardiac medications before your next appointment, please call your pharmacy.  Thank you for choosing [facility]!!        Signed, [name], MD  04/27/2018 9:19 AM    [facility] [place], [place], [place]  [address] Phone: [phone]; Fax: [phone]

## 2018-04-27 NOTE — Patient Instructions (Addendum)
Medication Instructions:  The current medical regimen is effective;  continue present plan and medications.  Follow-Up: Follow up in 6 months with Truitt Merle, NP.   Follow up in 1 year with Dr. Marlou Porch.  You will receive a letter in the mail 2 months before you are due.  Please call us when you receive this letter to schedule your follow up appointment.  If you need a refill on your cardiac medications before your next appointment, please call your pharmacy.  Thank you for choosing [facility]!!

## 2018-04-28 NOTE — Addendum Note (Signed)
Addended by: Patterson Hammersmith A on: 04/28/2018 08:55 AM   Modules accepted: Orders

## 2018-05-04 ENCOUNTER — Encounter: Payer: Self-pay | Admitting: Cardiology

## 2018-05-09 ENCOUNTER — Other Ambulatory Visit: Payer: Self-pay | Admitting: Cardiology

## 2018-05-09 DIAGNOSIS — I480 Paroxysmal atrial fibrillation: Secondary | ICD-10-CM

## 2018-05-13 DIAGNOSIS — H26491 Other secondary cataract, right eye: Secondary | ICD-10-CM | POA: Diagnosis not present

## 2018-05-13 DIAGNOSIS — H40013 Open angle with borderline findings, low risk, bilateral: Secondary | ICD-10-CM | POA: Diagnosis not present

## 2018-05-13 DIAGNOSIS — H524 Presbyopia: Secondary | ICD-10-CM | POA: Diagnosis not present

## 2018-05-13 DIAGNOSIS — H02831 Dermatochalasis of right upper eyelid: Secondary | ICD-10-CM | POA: Diagnosis not present

## 2018-05-13 DIAGNOSIS — Z961 Presence of intraocular lens: Secondary | ICD-10-CM | POA: Diagnosis not present

## 2018-05-13 DIAGNOSIS — E103293 Type 1 diabetes mellitus with mild nonproliferative diabetic retinopathy without macular edema, bilateral: Secondary | ICD-10-CM | POA: Diagnosis not present

## 2018-06-10 DIAGNOSIS — H26492 Other secondary cataract, left eye: Secondary | ICD-10-CM | POA: Diagnosis not present

## 2018-07-31 DIAGNOSIS — Z23 Encounter for immunization: Secondary | ICD-10-CM | POA: Diagnosis not present

## 2018-08-02 ENCOUNTER — Other Ambulatory Visit: Payer: Self-pay

## 2018-08-02 NOTE — Patient Outreach (Signed)
[facility]) Care Management  08/02/2018  Curtis Dunn [birth year] [id number]   Telephone Screen  Referral Date: 08/02/18 Referral Source: Nurse Call Center Referral Reason: " caller states he has questions about the new flu vaccine-flu block" Insurance: HTA   Outreach attempt # 1 to patient. No answer at present. RN CM left HIPAA compliant voicemail message along with contact info.      Plan: RN CM will make outreach attempt to patient within 3-4 business days.  RN CM will send unsuccessful outreach letter to patient.    Enzo Montgomery, RN,BSN,CCM [place] Management Telephonic Care Management Coordinator Direct Phone: [phone] Toll Free: [phone] Fax: [phone]

## 2018-08-03 ENCOUNTER — Other Ambulatory Visit: Payer: Self-pay

## 2018-08-03 NOTE — Patient Outreach (Signed)
[facility]) Care Management  08/03/2018  Curtis Dunn [birth year] [id number]     Telephone Screen  Referral Date: 08/02/18 Referral Source: Nurse Call Center Referral Reason: " caller states he has questions about the new flu vaccine-flu block" Insurance: HTA   Outreach attempt #2 to patient. No answer at present.   Plan: RN CM will make outreach attempt to patient within 3-4 business days.  Enzo Montgomery, RN,BSN,CCM [place] Management Telephonic Care Management Coordinator Direct Phone: [phone] Toll Free: [phone] Fax: [phone]

## 2018-08-04 ENCOUNTER — Other Ambulatory Visit: Payer: Self-pay

## 2018-08-04 NOTE — Patient Outreach (Signed)
[facility]) Care Management  08/04/2018  RENAUD CELLI [birth year] [id number]   Telephone Screen  Referral Date: 08/02/18 Referral Source: Nurse Call Center Referral Reason: " caller states he has questions about the new flu vaccine-flu block" Insurance: HTA   Outreach attempt # 3 to patient. No answer at present and unable to leave message.     Plan: RN CM will close case if no response from letter mailed to patient.    Enzo Montgomery, RN,BSN,CCM [place] Management Telephonic Care Management Coordinator Direct Phone: [phone] Toll Free: [phone] Fax: [phone]

## 2018-08-13 ENCOUNTER — Other Ambulatory Visit: Payer: Self-pay

## 2018-08-13 NOTE — Patient Outreach (Signed)
[facility]) Care Management  08/13/2018  AUSTAN NICHOLL [birth year] [id number]   Telephone Screen  Referral Date:08/02/18 Referral Source:Nurse Call Center Referral Reason:" caller states he has questions about the new flu vaccine-flu block" Insurance:HTA   Multiple attempts to establish contact with patient without success. No response from letter mailed to patient. Case is being closed at this time.     Plan: RN CM will close case at this time.   Enzo Montgomery, RN,BSN,CCM [place] Management Telephonic Care Management Coordinator Direct Phone: [phone] Toll Free: [phone] Fax: [phone]

## 2018-09-14 DIAGNOSIS — N401 Enlarged prostate with lower urinary tract symptoms: Secondary | ICD-10-CM | POA: Diagnosis not present

## 2018-09-14 DIAGNOSIS — F5221 Male erectile disorder: Secondary | ICD-10-CM | POA: Diagnosis not present

## 2018-09-14 DIAGNOSIS — N183 Chronic kidney disease, stage 3 (moderate): Secondary | ICD-10-CM | POA: Diagnosis not present

## 2018-09-14 DIAGNOSIS — I480 Paroxysmal atrial fibrillation: Secondary | ICD-10-CM | POA: Diagnosis not present

## 2018-09-14 DIAGNOSIS — E7849 Other hyperlipidemia: Secondary | ICD-10-CM | POA: Diagnosis not present

## 2018-09-14 DIAGNOSIS — E038 Other specified hypothyroidism: Secondary | ICD-10-CM | POA: Diagnosis not present

## 2018-09-14 DIAGNOSIS — Z794 Long term (current) use of insulin: Secondary | ICD-10-CM | POA: Diagnosis not present

## 2018-09-14 DIAGNOSIS — C180 Malignant neoplasm of cecum: Secondary | ICD-10-CM | POA: Diagnosis not present

## 2018-09-14 DIAGNOSIS — I10 Essential (primary) hypertension: Secondary | ICD-10-CM | POA: Diagnosis not present

## 2018-09-14 DIAGNOSIS — E1159 Type 2 diabetes mellitus with other circulatory complications: Secondary | ICD-10-CM | POA: Diagnosis not present

## 2018-09-14 DIAGNOSIS — I739 Peripheral vascular disease, unspecified: Secondary | ICD-10-CM | POA: Diagnosis not present

## 2018-09-14 DIAGNOSIS — E1029 Type 1 diabetes mellitus with other diabetic kidney complication: Secondary | ICD-10-CM | POA: Diagnosis not present

## 2018-09-30 DIAGNOSIS — L570 Actinic keratosis: Secondary | ICD-10-CM | POA: Diagnosis not present

## 2018-09-30 DIAGNOSIS — Z85828 Personal history of other malignant neoplasm of skin: Secondary | ICD-10-CM | POA: Diagnosis not present

## 2018-09-30 DIAGNOSIS — D1801 Hemangioma of skin and subcutaneous tissue: Secondary | ICD-10-CM | POA: Diagnosis not present

## 2018-09-30 DIAGNOSIS — L821 Other seborrheic keratosis: Secondary | ICD-10-CM | POA: Diagnosis not present

## 2018-10-28 DIAGNOSIS — N401 Enlarged prostate with lower urinary tract symptoms: Secondary | ICD-10-CM | POA: Diagnosis not present

## 2018-10-28 DIAGNOSIS — R3912 Poor urinary stream: Secondary | ICD-10-CM | POA: Diagnosis not present

## 2018-11-01 ENCOUNTER — Ambulatory Visit: Payer: PPO | Admitting: Nurse Practitioner

## 2018-11-01 ENCOUNTER — Encounter: Payer: Self-pay | Admitting: Nurse Practitioner

## 2018-11-01 VITALS — BP 200/90 | HR 61 | Ht 71.0 in | Wt 163.8 lb

## 2018-11-01 DIAGNOSIS — I480 Paroxysmal atrial fibrillation: Secondary | ICD-10-CM | POA: Diagnosis not present

## 2018-11-01 DIAGNOSIS — I10 Essential (primary) hypertension: Secondary | ICD-10-CM | POA: Diagnosis not present

## 2018-11-01 DIAGNOSIS — Z7901 Long term (current) use of anticoagulants: Secondary | ICD-10-CM | POA: Diagnosis not present

## 2018-11-01 LAB — CBC
Hematocrit: 43.7 % (ref 37.5–51.0)
Hemoglobin: 14.5 g/dL (ref 13.0–17.7)
MCH: 31.9 pg (ref 26.6–33.0)
MCHC: 33.2 g/dL (ref 31.5–35.7)
MCV: 96 fL (ref 79–97)
Platelets: 202 10*3/uL (ref 150–450)
RBC: 4.55 x10E6/uL (ref 4.14–5.80)
RDW: 11.9 % — ABNORMAL LOW (ref 12.3–15.4)
WBC: 7 10*3/uL (ref 3.4–10.8)

## 2018-11-01 LAB — BASIC METABOLIC PANEL
BUN/Creatinine Ratio: 22 (ref 10–24)
BUN: 31 mg/dL — ABNORMAL HIGH (ref 8–27)
CO2: 27 mmol/L (ref 20–29)
Calcium: 10.1 mg/dL (ref 8.6–10.2)
Chloride: 98 mmol/L (ref 96–106)
Creatinine, Ser: 1.38 mg/dL — ABNORMAL HIGH (ref 0.76–1.27)
GFR calc Af Amer: 57 mL/min/{1.73_m2} — ABNORMAL LOW (ref >59)
GFR calc non Af Amer: 49 mL/min/{1.73_m2} — ABNORMAL LOW (ref >59)
Glucose: 265 mg/dL — ABNORMAL HIGH (ref 65–99)
Potassium: 4.3 mmol/L (ref 3.5–5.2)
Sodium: 140 mmol/L (ref 134–144)

## 2018-11-01 MED ORDER — RAMIPRIL 10 MG PO CAPS: 10.0000 mg | ORAL_CAPSULE | Freq: Two times a day (BID) | ORAL | 3 refills | Status: DC

## 2018-11-01 MED ORDER — RIVAROXABAN 15 MG PO TABS: 15.0000 mg | ORAL_TABLET | Freq: Every day | ORAL | 6 refills | Status: DC

## 2018-11-01 NOTE — Progress Notes (Signed)
CARDIOLOGY OFFICE NOTE  Date:  11/01/2018    Curtis Dunn Date of Birth: 01-03-1941 Medical Record #[id number]  PCP:  Reynold Bowen, MD  Cardiologist:  Marisa Cyphers    Chief Complaint  Patient presents with  . Atrial Fibrillation    6 month check. Seen for Dr.Skains    History of Present Illness: Curtis Dunn is a 77 y.o. male who presents today for a 6 month check. Seen for Dr. Marlou Porch.   He has a history of PAF during a prior nuclear stress test in 2006 showing no ischemia. He was placed on Pradaxa inititally - then switched to Xarelto. He had a hypoglycemic episodei n 2015 with syncope and suffered a neck and wrist fracture. He has been managed with rate control - opted against AAD/ablation. Tended to not be aware of his AF. Former Emergency planning/management officer.   Seen over the years - has had some various orthopedic issues. Cardiac status has seemed to be stable.   Comes in today. Here alone. Lots of issues. He mentions several friends who - "send their EKG here every night". He has another friend who got a "cow valve". Asking if he needs to go take an EKG class if he got an Apple watch and then not get EKGs here. Checks his BP - but doubles the bottom number to get the top number. He does not  actually check his BP - unclear how he even gets a diastolic reading.  Asking why he is on Toprol and not Lopressor.  Feeling ok. Takes lots of supplements. Some bruising noted. No actual chest pain. Breathing is ok.   Past Medical History:  Diagnosis Date  . Broken neck ([place])    in March 2015  . Cataract   . Colon cancer ([place])   . Colon polyp   . Diabetes mellitus   . Dysrhythmia    a fib  . Hyperlipidemia   . Hypertension   . Paroxysmal atrial fibrillation [facility])     Past Surgical History:  Procedure Laterality Date  . ANKLE SURGERY     right  . APPENDECTOMY  2001  . arm surgery     right shoulder surgery  . CATARACT EXTRACTION EXTRACAPSULAR Bilateral   . COLON  RESECTION    . ELBOW FRACTURE SURGERY    . INGUINAL HERNIA REPAIR Left 03/13/2016   Procedure: LEFT INGUINAL HERNIA REPAIR WITH UNBILICAL HERNIA REPAIR;  Surgeon: [name], MD;  Location: [facility];  Service: General;  Laterality: Left;  . INSERTION OF MESH Left 03/13/2016   Procedure: INSERTION OF MESH;  Surgeon: [name], MD;  Location: [facility];  Service: General;  Laterality: Left;  [name] VASECTOMY       Medications: Current Meds  Medication Sig  . Ascorbic Acid (VITAMIN C) 1000 MG tablet Take 1,000 mg by mouth daily.  . Chelated Magnesium 100 MG TABS Take 1 tablet by mouth daily.   . Cholecalciferol (VITAMIN D3) 1000 units CAPS Take 1,000 Units by mouth daily.  . Cyanocobalamin (VITAMIN B 12 PO) Take 1 tablet by mouth daily. Taking 1000 daily  . Fish Oil-Krill Oil (KRILL OIL PLUS PO) Take 1,085 mg by mouth daily.  [name] Hawthorne Berry 550 MG CAPS Take 550 mg by mouth daily.  . hydrochlorothiazide (HYDRODIURIL) 25 MG tablet Take 25 mg by mouth daily.  . insulin aspart (NOVOLOG) 100 UNIT/ML injection Inject into the skin 3 (three) times daily before meals. Am 10 units, lunch 6  units, and dinner 7 units  . insulin glargine (LANTUS) 100 UNIT/ML injection Inject 13 Units into the skin 2 (two) times daily.   [name] lovastatin (MEVACOR) 20 MG tablet Take 20 mg by mouth at bedtime.  . metoprolol succinate (TOPROL-XL) 25 MG 24 hr tablet TAKE 1 TABLET BY MOUTH ONCE DAILY  . NON FORMULARY Take 1 tablet by mouth 2 (two) times daily.  . Potassium 99 MG TABS Take 1 tablet by mouth daily.   . Tamsulosin HCl (FLOMAX) 0.4 MG CAPS Take 0.4 mg by mouth daily.   . vitamin E 400 UNIT capsule Take 400 Units by mouth daily.  . Zinc 25 MG TABS Take 25 mg by mouth daily.   . [DISCONTINUED] ramipril (ALTACE) 10 MG tablet Take 10 mg by mouth daily.  . [DISCONTINUED] XARELTO 20 MG TABS tablet TAKE 1 TABLET BY MOUTH ONCE DAILY     Allergies: No Known Allergies  Social History: The patient  reports that he has  never smoked. He has never used smokeless tobacco. He reports that he does not drink alcohol or use drugs.   Family History: The patient's family history includes Heart disease in his father.   Review of Systems: Please see the history of present illness.   Otherwise, the review of systems is positive for none.   All other systems are reviewed and negative.   Physical Exam: VS:  BP (!) 200/90 (BP Location: Right Arm, Patient Position: Sitting, Cuff Size: Normal)   Pulse 61   Ht 5\' 11"  (1.803 m)   Wt 163 lb 12.8 oz (74.3 kg)   SpO2 97% Comment: at rest  BMI 22.85 kg/m  .  BMI Body mass index is 22.85 kg/m.  Wt Readings from Last 3 Encounters:  11/01/18 163 lb 12.8 oz (74.3 kg)  04/27/18 165 lb 3.2 oz (74.9 kg)  09/30/17 171 lb (77.6 kg)   BP recheck by me is down to 160/70 here today.   General: Rather talkative. Alert and in no acute distress.   HEENT: Normal.  Neck: Supple, no JVD, carotid bruits, or masses noted.  Cardiac: Regular rate and rhythm. No murmurs, rubs, or gallops. No edema.  Respiratory:  Lungs are clear to auscultation bilaterally with normal work of breathing.  GI: Soft and nontender.  MS: No deformity or atrophy. Gait and ROM intact.  Skin: Warm and dry. Color is normal.  Neuro:  Strength and sensation are intact and no gross focal deficits noted.  Psych: Alert, appropriate and with normal affect.   LABORATORY DATA:  EKG:  EKG is not ordered today.   Lab Results  Component Value Date   WBC 8.0 06/26/2016   HGB 15.1 06/26/2016   HCT 43.0 (A) 06/26/2016   PLT 286 03/06/2016   GLUCOSE 98 03/06/2016   CHOL 148 02/14/2014   TRIG 106 02/14/2014   HDL 48 02/14/2014   LDLCALC 79 02/14/2014   ALT 26 12/25/2015   AST 48 (H) 12/25/2015   NA 142 03/06/2016   K 4.1 03/06/2016   CL 99 (L) 03/06/2016   CREATININE 1.37 (H) 03/06/2016   BUN 28 (H) 03/06/2016   CO2 32 03/06/2016   INR 1.44 12/26/2015   HGBA1C 5.9 (H) 03/06/2016       BNP (last 3  results) No results for input(s): BNP in the last 8760 hours.  ProBNP (last 3 results) No results for input(s): PROBNP in the last 8760 hours.   Other Studies Reviewed Today:  Echo Study Conclusions  2013  - Left ventricle: The cavity size was normal. Wall thickness was normal. Systolic function was normal. The estimated ejection fraction was in the range of 60% to 65%. Wall motion was normal; there were no regional wall motion abnormalities. Doppler parameters are consistent with abnormal left ventricular relaxation (grade 1 diastolic dysfunction). - Right atrium: The atrium was moderately dilated. - Atrial septum: No defect or patent foramen ovale was identified. - Pulmonary arteries: PA peak pressure: 42mm Hg (S).   Assessment/Plan:  1. PAF - he is sinus today by exam. Has never actually been symptomatic.   2. HTN - uncontrolled - He is not even checking at home - unclear to me how he is getting these "diastolic readings to double". Increasing ACE today. Lab today. See back with his BP cuff.   3. Chronic anticoagulation - need to go to the 15 mg dose of Xarelto based on calculations of his creatine clearance today.   4. DM - followed by Dr. Forde Dandy  5. Remote trauma from hypoglycemic syncope (flail chest/ankle fracture, AKI) - not discused.   6. Chronic anticoagulation - I calculate his CrCl at 50 now - he needs to be on the lower dose now of Xarelto   7. HLD - on statin - lab by PCP   Current medicines are reviewed with the patient today.  The patient does not have concerns regarding medicines other than what has been noted above.  The following changes have been made:  See above.  Labs/ tests ordered today include:    Orders Placed This Encounter  Procedures  . Basic metabolic panel  . CBC     Disposition:   FU with me in about 6 weeks and will see Dr. Marlou Porch in 6 months.   Patient is agreeable to this plan and will call if any problems  develop in the interim.   SignedTruitt Merle, NP  11/01/2018 10:28 AM  [facility] [address] [place], [place]  [address] Phone: [phone] Fax: [phone]

## 2018-11-01 NOTE — Patient Instructions (Addendum)
We will be checking the following labs today - BMET & CBC  If you have labs (blood work) drawn today and your tests are completely normal, you will receive your results only by: [name] MyChart Message (if you have MyChart) OR . A paper copy in the mail If you have any lab test that is abnormal or we need to change your treatment, we will call you to review the results.   Medication Instructions:    Continue with your current medicines. BUT  I am increasing the Altace to 10 mg to take twice a day - this is at your drug store  I am cutting the dose of your Xarelto back to 15 mg a day - I have sent this to your drug store.    If you need a refill on your cardiac medications before your next appointment, please call your pharmacy.     Testing/Procedures To Be Arranged:  N/A  Follow-Up:   See me in about 6 weeks with your BP monitor.     At [facility] you and your health needs are our priority.  As part of our continuing mission to provide you with exceptional heart care, we have created designated Provider Care Teams.  These Care Teams include your primary Cardiologist (physician) and Advanced Practice Providers (APPs -  Physician Assistants and Nurse Practitioners) who all work together to provide you with the care you need, when you need it.  Special Instructions:  . None  Call the [place] office at [phone] if you have any questions, problems or concerns.

## 2018-11-18 ENCOUNTER — Other Ambulatory Visit: Payer: Self-pay | Admitting: *Deleted

## 2018-11-18 MED ORDER — AMLODIPINE BESYLATE 5 MG PO TABS: 5.0000 mg | ORAL_TABLET | Freq: Every day | ORAL | 1 refills | Status: DC

## 2018-11-23 ENCOUNTER — Encounter: Payer: Self-pay | Admitting: Podiatry

## 2018-11-23 ENCOUNTER — Ambulatory Visit: Payer: PPO | Admitting: Podiatry

## 2018-11-23 VITALS — BP 121/69

## 2018-11-23 DIAGNOSIS — L84 Corns and callosities: Secondary | ICD-10-CM

## 2018-11-23 DIAGNOSIS — B351 Tinea unguium: Secondary | ICD-10-CM

## 2018-11-23 DIAGNOSIS — E1151 Type 2 diabetes mellitus with diabetic peripheral angiopathy without gangrene: Secondary | ICD-10-CM

## 2018-11-23 DIAGNOSIS — Z9229 Personal history of other drug therapy: Secondary | ICD-10-CM

## 2018-11-23 NOTE — Progress Notes (Signed)
Subjective: Curtis Dunn presents today referred by Reynold Bowen, MD with diabetes and cc of  discolored, thick toenails 1-5 b/l feet which interfere with daily activities. He states he has been filing with an Herbalist. He also applies Vicks Vapor Rub to his toenails daily.  He would like to be educated on foot care.  Past Medical History:  Diagnosis Date  . Broken neck ([place])    in March 2015  . Cataract   . Colon cancer ([place])   . Colon polyp   . Diabetes mellitus   . Dysrhythmia    a fib  . Hyperlipidemia   . Hypertension   . Paroxysmal atrial fibrillation [facility])     Patient Active Problem List   Diagnosis Date Noted  . Multiple fractures of ribs of both sides 12/29/2015  . Flail chest 12/25/2015  . AKI (acute kidney injury) ([place]) 12/25/2015  . Hemopneumothorax on left 12/25/2015  . IDDM (insulin dependent diabetes mellitus) ([place]) 12/25/2015  . History of colon cancer 08/21/2014  . Acute blood loss anemia 02/11/2014  . Fracture dislocation of cervical spine ([place]) 02/10/2014  . C1 cervical fracture ([place]) 02/10/2014  . Fall 02/10/2014  . Distal radius fracture, right 02/10/2014  . Concussion 02/10/2014  . Facial laceration 02/10/2014  . Anticoagulated 02/10/2014  . PAF (paroxysmal atrial fibrillation) ([place]) 09/08/2012  . Palpitations 03/05/2012  . MALIGNANT NEOPLASM OF CECUM 12/11/2007  . COLONIC POLYPS 12/11/2007  . DIABETES MELLITUS, TYPE I 12/11/2007  . Essential hypertension 12/11/2007    Past Surgical History:  Procedure Laterality Date  . ANKLE SURGERY     right  . APPENDECTOMY  2001  . arm surgery     right shoulder surgery  . CATARACT EXTRACTION EXTRACAPSULAR Bilateral   . COLON RESECTION    . ELBOW FRACTURE SURGERY    . INGUINAL HERNIA REPAIR Left 03/13/2016   Procedure: LEFT INGUINAL HERNIA REPAIR WITH UNBILICAL HERNIA REPAIR;  Surgeon: [name], MD;  Location: [place];  Service: General;  Laterality: Left;  . INSERTION OF MESH Left  03/13/2016   Procedure: INSERTION OF MESH;  Surgeon: [name], MD;  Location: [place];  Service: General;  Laterality: Left;  [name] VASECTOMY       Current Outpatient Medications:  .  amLODipine (NORVASC) 5 MG tablet, Take 1 tablet (5 mg total) by mouth daily., Disp: 90 tablet, Rfl: 1 .  Ascorbic Acid (VITAMIN C) 1000 MG tablet, Take 1,000 mg by mouth daily., Disp: , Rfl:  .  Chelated Magnesium 100 MG TABS, Take 1 tablet by mouth daily. , Disp: , Rfl:  .  Cholecalciferol (VITAMIN D3) 1000 units CAPS, Take 1,000 Units by mouth daily., Disp: , Rfl:  .  Cyanocobalamin (VITAMIN B 12 PO), Take 1 tablet by mouth daily. Taking 1000 daily, Disp: , Rfl:  .  Fish Oil-Krill Oil (KRILL OIL PLUS PO), Take 1,085 mg by mouth daily., Disp: , Rfl:  .  Hawthorne Berry 550 MG CAPS, Take 550 mg by mouth daily., Disp: , Rfl: 0 .  hydrochlorothiazide (HYDRODIURIL) 25 MG tablet, Take 25 mg by mouth daily., Disp: , Rfl:  .  insulin aspart (NOVOLOG) 100 UNIT/ML injection, Inject into the skin 3 (three) times daily before meals. Am 10 units, lunch 6 units, and dinner 7 units, Disp: , Rfl:  .  insulin glargine (LANTUS) 100 UNIT/ML injection, Inject 13 Units into the skin 2 (two) times daily. , Disp: , Rfl:  .  lovastatin (MEVACOR) 20 MG tablet,  Take 20 mg by mouth at bedtime., Disp: , Rfl:  .  metoprolol succinate (TOPROL-XL) 25 MG 24 hr tablet, TAKE 1 TABLET BY MOUTH ONCE DAILY, Disp: 90 tablet, Rfl: 3 .  NON FORMULARY, Take 1 tablet by mouth 2 (two) times daily., Disp: , Rfl:  .  Potassium 99 MG TABS, Take 1 tablet by mouth daily. , Disp: , Rfl:  .  ramipril (ALTACE) 10 MG capsule, Take 1 capsule (10 mg total) by mouth 2 (two) times daily., Disp: 180 capsule, Rfl: 3 .  Rivaroxaban (XARELTO) 15 MG TABS tablet, Take 1 tablet (15 mg total) by mouth daily with supper., Disp: 30 tablet, Rfl: 6 .  Tamsulosin HCl (FLOMAX) 0.4 MG CAPS, Take 0.4 mg by mouth daily. , Disp: , Rfl:  .  vitamin E 400 UNIT capsule, Take 400 Units by  mouth daily., Disp: , Rfl:  .  Zinc 25 MG TABS, Take 25 mg by mouth daily. , Disp: , Rfl:   No Known Allergies  Social History   Occupational History  . Not on file  Tobacco Use  . Smoking status: Never Smoker  . Smokeless tobacco: Never Used  . Tobacco comment: occasional cigar  Substance and Sexual Activity  . Alcohol use: No  . Drug use: No  . Sexual activity: Not on file    Family History  Problem Relation Age of Onset  . Heart disease Father   . Colon cancer Neg Hx   . Esophageal cancer Neg Hx   . Rectal cancer Neg Hx   . Stomach cancer Neg Hx      There is no immunization history on file for this patient.   Review of systems: Positive Findings in bold print.  Constitutional:  chills, fatigue, fever, sweats, weight change Communication: Optometrist, sign Ecologist, hand writing, iPad/Android device Eyes: diplopia, glare,  light sensitivity, eyeglasses, blindness Ears nose mouth throat: Hard of hearing, deaf, sign language,  vertigo,   bloody nose,  rhinitis,  cold sores, snoring Cardiovascular: HTN, edema, arrhythmia, pacemaker in place, defibrillator in place,  chest pain/tightness, chronic anticoagulation, blood clot, atrial fibrillation Respiratory:  difficulty breathing, denies congestion, SOB, wheezing, cough Gastrointestinal: abdominal pain, diarrhea, nausea, vomiting,  Genitourinary:  nocturia,  pain on urination,  blood in urine, Foley catheter, urinary urgency Musculoskeletal: Uses mobility aid,  cramping, stiff joints, painful joints,  Skin: +changes in toenails, color change dryness, itchy skin, mole changes, or rash  Neurological: numbness, paresthesias, burning in feet, denies fainting,  seizure, change in speech. denies headaches, memory problems/poor historian, cerebral palsy Endocrine: diabetes, hypothyroidism, hyperthyroidism,  dry mouth, flushing, denies heat intolerance,  cold intolerance,  excessive thirst, denies polyuria,   nocturia Hematological:  easy bleeding,  excessive bleeding, easy bruising, enlarged lymph nodes, on long term blood thinner Allergy/immunological:  hive, frequent infections, multiple drug allergies, seasonal allergies,  Psychiatric:  anxiety, depression, mood disorder, suicidal ideations, hallucinations   Objective: Vascular Examination: Capillary refill time immediate x 10 digits Dorsalis pedis pulses faintly palpable bilaterally Posterior tibial pulses present 2 out of 4 b/l No digital hair x 10 digits Skin temperature gradient WNL b/l Negative Homans sign noted bilaterally.  Dermatological Examination: Skin with age-related atrophy.  It is thin and atrophic bilaterally.  Toenails 1-5 b/l discolored, thick, dystrophic with subungual debris and pain with palpation to nailbeds due to thickness of nails.  Toenails are noted to be adequate length today.  Mild callus buildup noted submetatarsal head 4 right foot.  No erythema, no  edema, no flocculence.  Musculoskeletal: Muscle strength 5/5 to all LE muscle groups Mild hammertoe contracture 2 through 5 bilaterally.  Neurological: Sensation intact with 10 gram monofilament Vibratory sensation intact.  Assessment: 1. Painful onychomycosis toenails 1-5 b/l   2. NIDDM with peripheral arterial disease 3. Patient on long-term anticoagulant  Plan: 1. Discussed diabetic foot care principles. Literature dispensed on today.  We discussed not using sharp instrumentation on his feet.  He may continue to gently file his toenails.  He may continue to use his Vicks VapoRub on his toenails 2. Toenails adequate length and well maintained on today's visit.  Toenails not debrided on today's visit. 3. Callus submetatarsal head 4 right foot was pared without complication. 4. Patient to continue soft, supportive shoe gear 5. Patient to report any pedal injuries to medical professional immediately. 6. Follow up 4 months. Patient/POA to call should  there be a concern in the interim.

## 2018-11-23 NOTE — Patient Instructions (Addendum)
Diabetes Mellitus and Foot Care Foot care is an important part of your health, especially when you have diabetes. Diabetes may cause you to have problems because of poor blood flow (circulation) to your feet and legs, which can cause your skin to:  Become thinner and drier.  Break more easily.  Heal more slowly.  Peel and crack. You may also have nerve damage (neuropathy) in your legs and feet, causing decreased feeling in them. This means that you may not notice minor injuries to your feet that could lead to more serious problems. Noticing and addressing any potential problems early is the best way to prevent future foot problems. How to care for your feet Foot hygiene  Wash your feet daily with warm water and mild soap. Do not use hot water. Then, pat your feet and the areas between your toes until they are completely dry. Do not soak your feet as this can dry your skin.  Trim your toenails straight across. Do not dig under them or around the cuticle. File the edges of your nails with an emery board or nail file.  Apply a moisturizing lotion or petroleum jelly to the skin on your feet and to dry, brittle toenails. Use lotion that does not contain alcohol and is unscented. Do not apply lotion between your toes. Shoes and socks  Wear clean socks or stockings every day. Make sure they are not too tight. Do not wear knee-high stockings since they may decrease blood flow to your legs.  Wear shoes that fit properly and have enough cushioning. Always look in your shoes before you put them on to be sure there are no objects inside.  To break in new shoes, wear them for just a few hours a day. This prevents injuries on your feet. Wounds, scrapes, corns, and calluses  Check your feet daily for blisters, cuts, bruises, sores, and redness. If you cannot see the bottom of your feet, use a mirror or ask someone for help.  Do not cut corns or calluses or try to remove them with medicine.  If you  find a minor scrape, cut, or break in the skin on your feet, keep it and the skin around it clean and dry. You may clean these areas with mild soap and water. Do not clean the area with peroxide, alcohol, or iodine.  If you have a wound, scrape, corn, or callus on your foot, look at it several times a day to make sure it is healing and not infected. Check for: ? Redness, swelling, or pain. ? Fluid or blood. ? Warmth. ? Pus or a bad smell. General instructions  Do not cross your legs. This may decrease blood flow to your feet.  Do not use heating pads or hot water bottles on your feet. They may burn your skin. If you have lost feeling in your feet or legs, you may not know this is happening until it is too late.  Protect your feet from hot and cold by wearing shoes, such as at the beach or on hot pavement.  Schedule a complete foot exam at least once a year (annually) or more often if you have foot problems. If you have foot problems, report any cuts, sores, or bruises to your health care provider immediately. Contact a health care provider if:  You have a medical condition that increases your risk of infection and you have any cuts, sores, or bruises on your feet.  You have an injury that is not   healing.  You have redness on your legs or feet.  You feel burning or tingling in your legs or feet.  You have pain or cramps in your legs and feet.  Your legs or feet are numb.  Your feet always feel cold.  You have pain around a toenail. Get help right away if:  You have a wound, scrape, corn, or callus on your foot and: ? You have pain, swelling, or redness that gets worse. ? You have fluid or blood coming from the wound, scrape, corn, or callus. ? Your wound, scrape, corn, or callus feels warm to the touch. ? You have pus or a bad smell coming from the wound, scrape, corn, or callus. ? You have a fever. ? You have a red line going up your leg. Summary  Check your feet every day  for cuts, sores, red spots, swelling, and blisters.  Moisturize feet and legs daily.  Wear shoes that fit properly and have enough cushioning.  If you have foot problems, report any cuts, sores, or bruises to your health care provider immediately.  Schedule a complete foot exam at least once a year (annually) or more often if you have foot problems. This information is not intended to replace advice given to you by your health care provider. Make sure you discuss any questions you have with your health care provider. Document Released: 10/31/2000 Document Revised: 12/16/2017 Document Reviewed: 12/05/2016 Elsevier Interactive Patient Education  2019 [employer] Onychomycosis/Fungal Toenails  WHAT IS IT? An infection that lies within the keratin of your nail plate that is caused by a fungus.  WHY ME? Fungal infections affect all ages, sexes, races, and creeds.  There may be many factors that predispose you to a fungal infection such as age, coexisting medical conditions such as diabetes, or an autoimmune disease; stress, medications, fatigue, genetics, etc.  Bottom line: fungus thrives in a warm, moist environment and your shoes offer such a location.  IS IT CONTAGIOUS? Theoretically, yes.  You do not want to share shoes, nail clippers or files with someone who has fungal toenails.  Walking around barefoot in the same room or sleeping in the same bed is unlikely to transfer the organism.  It is important to realize, however, that fungus can spread easily from one nail to the next on the same foot.  HOW DO WE TREAT THIS?  There are several ways to treat this condition.  Treatment may depend on many factors such as age, medications, pregnancy, liver and kidney conditions, etc.  It is best to ask your doctor which options are available to you.  1. No treatment.   Unlike many other medical concerns, you can live with this condition.  However for many people this can be a painful condition and may  lead to ingrown toenails or a bacterial infection.  It is recommended that you keep the nails cut short to help reduce the amount of fungal nail. 2. Topical treatment.  These range from herbal remedies to prescription strength nail lacquers.  About 40-50% effective, topicals require twice daily application for approximately 9 to 12 months or until an entirely new nail has grown out.  The most effective topicals are medical grade medications available through physicians offices. 3. Oral antifungal medications.  With an 80-90% cure rate, the most common oral medication requires 3 to 4 months of therapy and stays in your system for a year as the new nail grows out.  Oral antifungal medications do require blood  work to make sure it is a safe drug for you.  A liver function panel will be performed prior to starting the medication and after the first month of treatment.  It is important to have the blood work performed to avoid any harmful side effects.  In general, this medication safe but blood work is required. 4. Laser Therapy.  This treatment is performed by applying a specialized laser to the affected nail plate.  This therapy is noninvasive, fast, and non-painful.  It is not covered by insurance and is therefore, out of pocket.  The results have been very good with a 80-95% cure rate.  The [facility] is the only practice in the area to offer this therapy. 5. Permanent Nail Avulsion.  Removing the entire nail so that a new nail will not grow back. Onychomycosis/Fungal Toenails  WHAT IS IT? An infection that lies within the keratin of your nail plate that is caused by a fungus.  WHY ME? Fungal infections affect all ages, sexes, races, and creeds.  There may be many factors that predispose you to a fungal infection such as age, coexisting medical conditions such as diabetes, or an autoimmune disease; stress, medications, fatigue, genetics, etc.  Bottom line: fungus thrives in a warm, moist environment  and your shoes offer such a location.  IS IT CONTAGIOUS? Theoretically, yes.  You do not want to share shoes, nail clippers or files with someone who has fungal toenails.  Walking around barefoot in the same room or sleeping in the same bed is unlikely to transfer the organism.  It is important to realize, however, that fungus can spread easily from one nail to the next on the same foot.  HOW DO WE TREAT THIS?  There are several ways to treat this condition.  Treatment may depend on many factors such as age, medications, pregnancy, liver and kidney conditions, etc.  It is best to ask your doctor which options are available to you.  6. No treatment.   Unlike many other medical concerns, you can live with this condition.  However for many people this can be a painful condition and may lead to ingrown toenails or a bacterial infection.  It is recommended that you keep the nails cut short to help reduce the amount of fungal nail. 7. Topical treatment.  These range from herbal remedies to prescription strength nail lacquers.  About 40-50% effective, topicals require twice daily application for approximately 9 to 12 months or until an entirely new nail has grown out.  The most effective topicals are medical grade medications available through physicians offices. 8. Oral antifungal medications.  With an 80-90% cure rate, the most common oral medication requires 3 to 4 months of therapy and stays in your system for a year as the new nail grows out.  Oral antifungal medications do require blood work to make sure it is a safe drug for you.  A liver function panel will be performed prior to starting the medication and after the first month of treatment.  It is important to have the blood work performed to avoid any harmful side effects.  In general, this medication safe but blood work is required. 9. Laser Therapy.  This treatment is performed by applying a specialized laser to the affected nail plate.  This therapy  is noninvasive, fast, and non-painful.  It is not covered by insurance and is therefore, out of pocket.  The results have been very good with a 80-95% cure rate.  The [facility] is the only practice in the area to offer this therapy. 10. Permanent Nail Avulsion.  Removing the entire nail so that a new nail will not grow back.  Corns and Calluses Corns are small areas of thickened skin that occur on the top, sides, or tip of a toe. They contain a cone-shaped core with a point that can press on a nerve below. This causes pain.  Calluses are areas of thickened skin that can occur anywhere on the body, including the hands, fingers, palms, soles of the feet, and heels. Calluses are usually larger than corns. What are the causes? Corns and calluses are caused by rubbing (friction) or pressure, such as from shoes that are too tight or do not fit properly. What increases the risk? Corns are more likely to develop in people who have misshapen toes (toe deformities), such as hammer toes. Calluses can occur with friction to any area of the skin. They are more likely to develop in people who:  Work with their hands.  Wear shoes that fit poorly, are too tight, or are high-heeled.  Have toe deformities. What are the signs or symptoms? Symptoms of a corn or callus include:  A hard growth on the skin.  Pain or tenderness under the skin.  Redness and swelling.  Increased discomfort while wearing tight-fitting shoes, if your feet are affected. If a corn or callus becomes infected, symptoms may include:  Redness and swelling that gets worse.  Pain.  Fluid, blood, or pus draining from the corn or callus. How is this diagnosed? Corns and calluses may be diagnosed based on your symptoms, your medical history, and a physical exam. How is this treated? Treatment for corns and calluses may include:  Removing the cause of the friction or pressure. This may involve: ? Changing your  shoes. ? Wearing shoe inserts (orthotics) or other protective layers in your shoes, such as a corn pad. ? Wearing gloves.  Applying medicine to the skin (topical medicine) to help soften skin in the hardened, thickened areas.  Removing layers of dead skin with a file to reduce the size of the corn or callus.  Removing the corn or callus with a scalpel or laser.  Taking antibiotic medicines, if your corn or callus is infected.  Having surgery, if a toe deformity is the cause. Follow these instructions at home:   Take over-the-counter and prescription medicines only as told by your health care provider.  If you were prescribed an antibiotic, take it as told by your health care provider. Do not stop taking it even if your condition starts to improve.  Wear shoes that fit well. Avoid wearing high-heeled shoes and shoes that are too tight or too loose.  Wear any padding, protective layers, gloves, or orthotics as told by your health care provider.  Soak your hands or feet and then use a file or pumice stone to soften your corn or callus. Do this as told by your health care provider.  Check your corn or callus every day for symptoms of infection. Contact a health care provider if you:  Notice that your symptoms do not improve with treatment.  Have redness or swelling that gets worse.  Notice that your corn or callus becomes painful.  Have fluid, blood, or pus coming from your corn or callus.  Have new symptoms. Summary  Corns are small areas of thickened skin that occur on the top, sides, or tip of a toe.  Calluses are  areas of thickened skin that can occur anywhere on the body, including the hands, fingers, palms, and soles of the feet. Calluses are usually larger than corns.  Corns and calluses are caused by rubbing (friction) or pressure, such as from shoes that are too tight or do not fit properly.  Treatment may include wearing any padding, protective layers, gloves, or  orthotics as told by your health care provider. This information is not intended to replace advice given to you by your health care provider. Make sure you discuss any questions you have with your health care provider. Document Released: 08/09/2004 Document Revised: 09/16/2017 Document Reviewed: 09/16/2017 Elsevier Interactive Patient Education  2019 [employer].

## 2018-12-07 ENCOUNTER — Encounter: Payer: Self-pay | Admitting: Nurse Practitioner

## 2018-12-07 ENCOUNTER — Ambulatory Visit (INDEPENDENT_AMBULATORY_CARE_PROVIDER_SITE_OTHER): Payer: HMO | Admitting: Nurse Practitioner

## 2018-12-07 VITALS — BP 170/80 | HR 61 | Ht 72.0 in | Wt 163.0 lb

## 2018-12-07 DIAGNOSIS — I10 Essential (primary) hypertension: Secondary | ICD-10-CM

## 2018-12-07 DIAGNOSIS — Z7901 Long term (current) use of anticoagulants: Secondary | ICD-10-CM | POA: Diagnosis not present

## 2018-12-07 DIAGNOSIS — I480 Paroxysmal atrial fibrillation: Secondary | ICD-10-CM | POA: Diagnosis not present

## 2018-12-07 LAB — BASIC METABOLIC PANEL
BUN/Creatinine Ratio: 25 — ABNORMAL HIGH (ref 10–24)
BUN: 27 mg/dL (ref 8–27)
CO2: 28 mmol/L (ref 20–29)
Calcium: 10.1 mg/dL (ref 8.6–10.2)
Chloride: 102 mmol/L (ref 96–106)
Creatinine, Ser: 1.06 mg/dL (ref 0.76–1.27)
GFR calc Af Amer: 78 mL/min/{1.73_m2} (ref >59)
GFR calc non Af Amer: 67 mL/min/{1.73_m2} (ref >59)
Glucose: 60 mg/dL — ABNORMAL LOW (ref 65–99)
Potassium: 4.2 mmol/L (ref 3.5–5.2)
Sodium: 146 mmol/L — ABNORMAL HIGH (ref 134–144)

## 2018-12-07 MED ORDER — HYDRALAZINE HCL 25 MG PO TABS: 25.0000 mg | ORAL_TABLET | Freq: Two times a day (BID) | ORAL | 3 refills | Status: DC

## 2018-12-07 NOTE — Patient Instructions (Addendum)
We will be checking the following labs today - BMET   Medication Instructions:    Continue with your current medicines.   But I am adding Hydralazine 25 mg to take twice a day - this is at your pharmacy   If you need a refill on your cardiac medications before your next appointment, please call your pharmacy.     Testing/Procedures To Be Arranged:  N/A  Follow-Up:   See Dr. Marlou Porch in 2 months   At [facility], you and your health needs are our priority.  As part of our continuing mission to provide you with exceptional heart care, we have created designated Provider Care Teams.  These Care Teams include your primary Cardiologist (physician) and Advanced Practice Providers (APPs -  Physician Assistants and Nurse Practitioners) who all work together to provide you with the care you need, when you need it.  Special Instructions:  . Start a new BP diary for [place].   Call the [place] office at [phone] if you have any questions, problems or concerns.

## 2018-12-07 NOTE — Progress Notes (Signed)
CARDIOLOGY OFFICE NOTE  Date:  12/07/2018    Curtis Dunn Date of Birth: 1941-01-16 Medical Record #[id number]  PCP:  Curtis Bowen, MD  Cardiologist:  Curtis Dunn    Chief Complaint  Patient presents with  . Hypertension    Follow up visit - seen for Dr. Marlou Porch    History of Present Illness: Curtis Dunn is a 78 y.o. male who presents today for a one month check. Seen for Dr. Marlou Porch.   He has a history of PAF during a prior nuclear stress test in 2006 showing no ischemia. He was placed on Pradaxa inititally - then switched to Xarelto. He had a hypoglycemic episode in 2015 with syncope and suffered a neck and wrist fracture/flail chest. He has been managed with rate control - opted against AAD/ablation. Tended to not be aware of his AF. Former Emergency planning/management officer. Other issues include IDDM, HLD, & chronic anticoagulation.   Seen over the years - has had some various orthopedic issues. Cardiac status has seemed to be stable.   I saw him a month ago - he had lots of issues - was not checking his BP correctly and then it was realized that he does not even check his BP - somehow he would "double the bottom number to get the top one" - unclear to me how he could do this. Using lots of supplements. Actually feeling ok. He was in sinus on exam. BP was elevated - ACE was increased.   Comes in today. Here alone. He feels fine. Wishes he could "mentally and physically get his BP down".  He got a new BP cuff - but not a new meter. He brings in numerous readings - all still high. His cuff does correlate with ours today. He feels fine. Some easy bruising.   Past Medical History:  Diagnosis Date  . Broken neck ([place])    in March 2015  . Cataract   . Colon cancer ([place])   . Colon polyp   . Diabetes mellitus   . Dysrhythmia    a fib  . Hyperlipidemia   . Hypertension   . Paroxysmal atrial fibrillation [facility])     Past Surgical History:  Procedure Laterality Date  .  ANKLE SURGERY     right  . APPENDECTOMY  2001  . arm surgery     right shoulder surgery  . CATARACT EXTRACTION EXTRACAPSULAR Bilateral   . COLON RESECTION    . ELBOW FRACTURE SURGERY    . INGUINAL HERNIA REPAIR Left 03/13/2016   Procedure: LEFT INGUINAL HERNIA REPAIR WITH UNBILICAL HERNIA REPAIR;  Surgeon: [name], MD;  Location: [facility];  Service: General;  Laterality: Left;  . INSERTION OF MESH Left 03/13/2016   Procedure: INSERTION OF MESH;  Surgeon: [name], MD;  Location: [facility];  Service: General;  Laterality: Left;  [name] VASECTOMY       Medications: Current Meds  Medication Sig  . amLODipine (NORVASC) 5 MG tablet Take 1 tablet (5 mg total) by mouth daily.  . Ascorbic Acid (VITAMIN C) 1000 MG tablet Take 1,000 mg by mouth daily.  . Chelated Magnesium 100 MG TABS Take 1 tablet by mouth daily.   . Cholecalciferol (VITAMIN D3) 1000 units CAPS Take 1,000 Units by mouth daily.  . Cyanocobalamin (VITAMIN B 12 PO) Take 1 tablet by mouth daily. Taking 1000 daily  . Fish Oil-Krill Oil (KRILL OIL PLUS PO) Take 1,085 mg by mouth daily.  [name] Hawthorne  Berry 550 MG CAPS Take 550 mg by mouth daily.  . hydrochlorothiazide (HYDRODIURIL) 25 MG tablet Take 25 mg by mouth daily.  . insulin aspart (NOVOLOG) 100 UNIT/ML injection Inject into the skin 3 (three) times daily before meals. Am 10 units, lunch 6 units, and dinner 7 units  . insulin glargine (LANTUS) 100 UNIT/ML injection Inject 13 Units into the skin 2 (two) times daily.   [name] lovastatin (MEVACOR) 20 MG tablet Take 20 mg by mouth at bedtime.  . metoprolol succinate (TOPROL-XL) 25 MG 24 hr tablet TAKE 1 TABLET BY MOUTH ONCE DAILY  . NON FORMULARY Take 1 tablet by mouth 2 (two) times daily.  . Potassium 99 MG TABS Take 1 tablet by mouth daily.   . ramipril (ALTACE) 10 MG capsule Take 1 capsule (10 mg total) by mouth 2 (two) times daily.  . Rivaroxaban (XARELTO) 15 MG TABS tablet Take 1 tablet (15 mg total) by mouth daily with supper.  .  Tamsulosin HCl (FLOMAX) 0.4 MG CAPS Take 0.4 mg by mouth daily.   . vitamin E 400 UNIT capsule Take 400 Units by mouth daily.  . Zinc 25 MG TABS Take 25 mg by mouth daily.      Allergies: No Known Allergies  Social History: The patient  reports that he has never smoked. He has never used smokeless tobacco. He reports that he does not drink alcohol or use drugs.   Family History: The patient's family history includes Heart disease in his father.   Review of Systems: Please see the history of present illness.   Otherwise, the review of systems is positive for none.   All other systems are reviewed and negative.   Physical Exam: VS:  BP (!) 170/80 (BP Location: Left Arm, Patient Position: Sitting, Cuff Size: Normal)   Pulse 61   Ht 6' (1.829 m)   Wt 163 lb (73.9 kg)   SpO2 94% Comment: at rest  BMI 22.11 kg/m  .  BMI Body mass index is 22.11 kg/m.  Wt Readings from Last 3 Encounters:  12/07/18 163 lb (73.9 kg)  11/01/18 163 lb 12.8 oz (74.3 kg)  04/27/18 165 lb 3.2 oz (74.9 kg)   His cuff was 171/83 - ours is 170/80  General: Alert and in no acute distress.  Talkative.  HEENT: Normal.  Neck: Supple, no JVD, carotid bruits, or masses noted.  Cardiac: Regular rate and rhythm. No murmurs, rubs, or gallops. No edema.  Respiratory:  Lungs are clear to auscultation bilaterally with normal work of breathing.  GI: Soft and nontender.  MS: No deformity or atrophy. Gait and ROM intact.  Skin: Warm and dry. Color is normal.  Neuro:  Strength and sensation are intact and no gross focal deficits noted.  Psych: Alert, appropriate and with normal affect.   LABORATORY DATA:  EKG:  EKG is not ordered today.  Lab Results  Component Value Date   WBC 7.0 11/01/2018   HGB 14.5 11/01/2018   HCT 43.7 11/01/2018   PLT 202 11/01/2018   GLUCOSE 265 (H) 11/01/2018   CHOL 148 02/14/2014   TRIG 106 02/14/2014   HDL 48 02/14/2014   LDLCALC 79 02/14/2014   ALT 26 12/25/2015   AST 48 (H)  12/25/2015   NA 140 11/01/2018   K 4.3 11/01/2018   CL 98 11/01/2018   CREATININE 1.38 (H) 11/01/2018   BUN 31 (H) 11/01/2018   CO2 27 11/01/2018   INR 1.44 12/26/2015   HGBA1C 5.9 (H)  03/06/2016       BNP (last 3 results) No results for input(s): BNP in the last 8760 hours.  ProBNP (last 3 results) No results for input(s): PROBNP in the last 8760 hours.   Other Studies Reviewed Today:  Echo Study Conclusions 2013  - Left ventricle: The cavity size was normal. Wall thickness was normal. Systolic function was normal. The estimated ejection fraction was in the range of 60% to 65%. Wall motion was normal; there were no regional wall motion abnormalities. Doppler parameters are consistent with abnormal left ventricular relaxation (grade 1 diastolic dysfunction). - Right atrium: The atrium was moderately dilated. - Atrial septum: No defect or patent foramen ovale was identified. - Pulmonary arteries: PA peak pressure: 69mm Hg (S).   Assessment/Plan:  1. PAF - remains in sinus by exam. He is on anticoagulation.   2. HTN - Not controlled - adding hydralazine 25 mg BID. BMET today to recheck since ACE was increased at last visit. He did not wish to increase Norvasc due to fear of edema.   3. Chronic anticoagulation - was cut to the 15 mg dose at last visit due to his creatinine clearance - some mild bruising but no bleeding.   4. DM - followed by Dr. Forde Dandy  5. Remote trauma from hypoglycemic syncope (flail chest/ankle fracture, AKI) - not discused.   6. Chronic anticoagulation - on lower dose now of Xarelto   7. HLD - on statin - lab by PCP  Current medicines are reviewed with the patient today.  The patient does not have concerns regarding medicines other than what has been noted above.   Current medicines are reviewed with the patient today.  The patient does not have concerns regarding medicines other than what has been noted above.  The  following changes have been made:  See above.  Labs/ tests ordered today include:    Orders Placed This Encounter  Procedures  . Basic metabolic panel     Disposition:   FU with Dr. Marlou Porch in about 2 months.   Patient is agreeable to this plan and will call if any problems develop in the interim.   SignedTruitt Merle, NP  12/07/2018 8:57 AM  [facility] [address] [place], [place]  [address] Phone: [phone] Fax: [phone]

## 2019-01-04 ENCOUNTER — Other Ambulatory Visit: Payer: Self-pay | Admitting: *Deleted

## 2019-01-04 MED ORDER — HYDRALAZINE HCL 50 MG PO TABS: 50.0000 mg | ORAL_TABLET | Freq: Two times a day (BID) | ORAL | 3 refills | Status: DC

## 2019-01-04 NOTE — Telephone Encounter (Signed)
Per [employer], s/w pt and is aware of Lori's recommendation's.  Will increase hydralazine one tablet (50 mg ) bid, new script sent in to requested pharmacy.  Will continue to keep track of bp and bring in on upcoming ov with Dr.Skains.

## 2019-01-13 DIAGNOSIS — E103293 Type 1 diabetes mellitus with mild nonproliferative diabetic retinopathy without macular edema, bilateral: Secondary | ICD-10-CM | POA: Diagnosis not present

## 2019-01-13 DIAGNOSIS — H02831 Dermatochalasis of right upper eyelid: Secondary | ICD-10-CM | POA: Diagnosis not present

## 2019-01-13 DIAGNOSIS — H40013 Open angle with borderline findings, low risk, bilateral: Secondary | ICD-10-CM | POA: Diagnosis not present

## 2019-01-13 DIAGNOSIS — H524 Presbyopia: Secondary | ICD-10-CM | POA: Diagnosis not present

## 2019-01-13 DIAGNOSIS — H02832 Dermatochalasis of right lower eyelid: Secondary | ICD-10-CM | POA: Diagnosis not present

## 2019-01-20 DIAGNOSIS — I739 Peripheral vascular disease, unspecified: Secondary | ICD-10-CM | POA: Diagnosis not present

## 2019-01-20 DIAGNOSIS — N183 Chronic kidney disease, stage 3 (moderate): Secondary | ICD-10-CM | POA: Diagnosis not present

## 2019-01-20 DIAGNOSIS — N401 Enlarged prostate with lower urinary tract symptoms: Secondary | ICD-10-CM | POA: Diagnosis not present

## 2019-01-20 DIAGNOSIS — E1029 Type 1 diabetes mellitus with other diabetic kidney complication: Secondary | ICD-10-CM | POA: Diagnosis not present

## 2019-01-20 DIAGNOSIS — I10 Essential (primary) hypertension: Secondary | ICD-10-CM | POA: Diagnosis not present

## 2019-01-20 DIAGNOSIS — F418 Other specified anxiety disorders: Secondary | ICD-10-CM | POA: Diagnosis not present

## 2019-01-20 DIAGNOSIS — I5189 Other ill-defined heart diseases: Secondary | ICD-10-CM | POA: Diagnosis not present

## 2019-01-20 DIAGNOSIS — I480 Paroxysmal atrial fibrillation: Secondary | ICD-10-CM | POA: Diagnosis not present

## 2019-01-20 DIAGNOSIS — E7849 Other hyperlipidemia: Secondary | ICD-10-CM | POA: Diagnosis not present

## 2019-01-20 DIAGNOSIS — Z794 Long term (current) use of insulin: Secondary | ICD-10-CM | POA: Diagnosis not present

## 2019-01-20 DIAGNOSIS — E038 Other specified hypothyroidism: Secondary | ICD-10-CM | POA: Diagnosis not present

## 2019-02-07 ENCOUNTER — Other Ambulatory Visit: Payer: Self-pay | Admitting: *Deleted

## 2019-02-07 DIAGNOSIS — I10 Essential (primary) hypertension: Secondary | ICD-10-CM

## 2019-02-07 DIAGNOSIS — E119 Type 2 diabetes mellitus without complications: Secondary | ICD-10-CM

## 2019-02-07 DIAGNOSIS — I480 Paroxysmal atrial fibrillation: Secondary | ICD-10-CM

## 2019-02-07 DIAGNOSIS — IMO0001 Reserved for inherently not codable concepts without codable children: Secondary | ICD-10-CM

## 2019-02-07 DIAGNOSIS — Z794 Long term (current) use of insulin: Secondary | ICD-10-CM

## 2019-02-07 NOTE — Patient Outreach (Addendum)
  [facility]) Care Management Chronic Special Needs Program  02/07/2019  Name: Curtis Dunn DOB: July 21, 1941  MRN: [id number]  [name] is enrolled in a chronic special needs plan for  Diabetes. A completed health risk assessment has not been received from the client and client has not responded to outreach attempts by their health care concierge.  The client's individualized care plan was developed based on available data.  Plan:  . Send unsuccessful outreach letter with a copy of individualized care plan to client . Send Emergency planning/management officer to client . Send educational materials to client on:  how to measure blood pressure at home;  atrial fibrillation- what is it is and how to treat it . Refer client to [place] Management pharmacist related to polypharmacy . Send individualized care plan to provider  Chronic care management coordinator, Peter Garter, will attempt outreach in 2-4 months.    Barrington Ellison RN,CCM,CDE [place] Management Coordinator Office Phone [phone] Office Fax [phone]

## 2019-02-08 ENCOUNTER — Telehealth: Payer: Self-pay | Admitting: Cardiology

## 2019-02-08 ENCOUNTER — Ambulatory Visit: Payer: HMO | Admitting: Cardiology

## 2019-02-08 NOTE — Telephone Encounter (Signed)
   Called patient.  Postponing office appointment.  Coronavirus.  Overall he is doing well.  He stated that several of his blood pressure readings have been less than 151 systolic which is an improvement for him.  Last change had been increasing hydralazine to 50 mg twice a day.  He is on currently a multidrug regimen including amlodipine hydralazine hydrochlorothiazide low-dose metoprolol and ramipril.  He denies any fevers chills cough.  He was at the gym early last week.  Showing no signs of infection.  No syncope no bleeding.  For his essential hypertension we are not can make any changes.  Continue as is.  Instructed obese, continue to wash his hands and protect himself, social distancing, staying at home.  He knows to contact us if any further worrisome symptoms develop.   Patient consented for telephone call visit. Time spent on call 8 minutes.

## 2019-02-10 ENCOUNTER — Other Ambulatory Visit: Payer: Self-pay | Admitting: Pharmacist

## 2019-02-10 NOTE — Patient Outreach (Addendum)
[facility]) Care Management  [place]  02/10/2019  Curtis Dunn November 23, 1940 [id number]   Reason for call: Referred by [facility] RNCM for medication review. Note that patient is enrolled in a chronic special needs plan for Diabetes.  Outreach:  Unsuccessful telephone call attempt #1 to patient.   Unable to leave message  Plan:  Noted that another [facility] discipline has recently mailed patient an unsuccessful outreach letter, [facility] Pharmacist will make another outreach attempt to patient within 7-10 business days  Harlow Asa, PharmD, [place] Management [phone]

## 2019-02-24 ENCOUNTER — Other Ambulatory Visit: Payer: Self-pay | Admitting: Pharmacist

## 2019-02-24 ENCOUNTER — Ambulatory Visit: Payer: Self-pay | Admitting: Pharmacist

## 2019-02-24 NOTE — Patient Outreach (Signed)
[facility]) Care Management  02/24/2019  Curtis Dunn [birth year] [id number]   Patient was called regarding medication review as he is a CSNP patient. Unfortunately, he did not answer the phone. Today's call was the second unsuccessful attempt.  Plan: Send patient an unsuccessful outreach letter. Call patient back in 14-28 days.   [name], PharmD, [place] Clinical Pharmacist [phone]

## 2019-03-04 NOTE — Telephone Encounter (Signed)
Recalled place for f/u.  Pt is aware to c/b if develops s/s prior to then.

## 2019-03-22 ENCOUNTER — Other Ambulatory Visit: Payer: Self-pay | Admitting: Pharmacist

## 2019-03-22 NOTE — Patient Outreach (Addendum)
[facility]) Care Management  03/22/2019  Curtis Dunn 08-11-1941 [id number]   Patient was called regarding medication review and medication assistance evaluation as he is part of CSNP with [facility]. Unfortunately, he did not answer his phone. HIPAA compliant message was left on his voicemail.  Today's call was the 3rd unsuccessful call.  Unsuccessful outreach letter sent on 02/24/2019.  Plan: Call patient back in 4-6 weeks.   [name], PharmD, [place] Clinical Pharmacist [phone]  ADDENDUM  Patient called me back. HIPAA identifiers were obtained.  [facility]) Care Management  [place]   03/22/2019  Curtis Dunn 1941-04-10 [id number]  Reason for referral: Medication Assistance, Medication Review, Medication Adherence, Medication Management  Referral source: [employer] Care Manager with [facility] Current insurance: [employer] C-SNP  PMHx includes but not limited to: Type 1 diabetes, hypertension, hyperlipidemia, Afib,    Outreach:  Successful telephone call with patient.  HIPAA identifiers verified.   Subjective:   Patient does not use an adherence  Strategy. He leaves his medications out on the table.  Does the patient ever forget to take medication?  no Does the patient have problems obtaining medications due to transportation?   no Does the patient have problems obtaining medications due to cost?  no  Does the patient feel that medications prescribed are effective?  No-doesn't feel like Novolog is working for him any more. Does the patient ever experience any side effects to the medications prescribed?  no  Does the patient measure his/her own blood glucose at home?  Yes Does the patient measure his/her own blood pressure at home? Yes   Objective: Lab Results  Component Value Date   CREATININE 1.06 12/07/2018   CREATININE 1.38 (H) 11/01/2018   CREATININE 1.37 (H)  03/06/2016    Lab Results  Component Value Date   HGBA1C 5.9 (H) 03/06/2016    Lipid Panel     Component Value Date/Time   CHOL 148 02/14/2014 0716   TRIG 106 02/14/2014 0716   HDL 48 02/14/2014 0716   CHOLHDL 3.1 02/14/2014 0716   VLDL 21 02/14/2014 0716   LDLCALC 79 02/14/2014 0716    BP Readings from Last 3 Encounters:  12/07/18 (!) 170/80  11/23/18 121/69  11/01/18 (!) 200/90    No Known Allergies  Medications Reviewed Today    Reviewed by Tamsen Snider (Certified Medical Assistant) on 12/07/18 at [phone]  Med List Status: <None>  Medication Order Taking? Sig Documenting Provider Last Dose Status Informant  amLODipine (NORVASC) 5 MG tablet [id number] Yes Take 1 tablet (5 mg total) by mouth daily. Burtis Junes, NP Taking Active   Ascorbic Acid (VITAMIN C) 1000 MG tablet [id number] Yes Take 1,000 mg by mouth daily. [provider] Taking Active Self  Chelated Magnesium 100 MG TABS [id number] Yes Take 1 tablet by mouth daily.  [provider] Taking Active Self  Cholecalciferol (VITAMIN D3) 1000 units CAPS [id number] Yes Take 1,000 Units by mouth daily. [provider] Taking Active   Cyanocobalamin (VITAMIN B 12 PO) [id number] Yes Take 1 tablet by mouth daily. Taking 1000 daily [provider] Taking Active Self  Fish Oil-Krill Oil (KRILL OIL PLUS PO) [id number] Yes Take 1,085 mg by mouth daily. [provider] Taking Active   Cathrine Muster 550 MG CAPS [id number] Yes Take 550 mg by mouth daily. Jerline Pain, MD Taking Active   hydrochlorothiazide (HYDRODIURIL) 25 MG tablet [id number] Yes Take  25 mg by mouth daily. [provider] Taking Active Self  insulin aspart (NOVOLOG) 100 UNIT/ML injection [id number] Yes Inject into the skin 3 (three) times daily before meals. Am 10 units, lunch 6 units, and dinner 7 units [provider] Taking Active   insulin glargine (LANTUS) 100 UNIT/ML injection [id number] Yes Inject 13  Units into the skin 2 (two) times daily.  [provider] Taking Active Self           Med Note Warner Mccreedy, CHASITIE R   Tue Sep 16, 2016  8:38 AM)    lovastatin (MEVACOR) 20 MG tablet [id number] Yes Take 20 mg by mouth at bedtime. [provider] Taking Active Self  metoprolol succinate (TOPROL-XL) 25 MG 24 hr tablet [id number] Yes TAKE 1 TABLET BY MOUTH ONCE DAILY Jerline Pain, MD Taking Active   NON FORMULARY [id number] Yes Take 1 tablet by mouth 2 (two) times daily. [provider] Taking Active   Potassium 99 MG TABS [id number] Yes Take 1 tablet by mouth daily.  [provider] Taking Active Self  ramipril (ALTACE) 10 MG capsule [id number] Yes Take 1 capsule (10 mg total) by mouth 2 (two) times daily. Burtis Junes, NP Taking Active   Rivaroxaban (XARELTO) 15 MG TABS tablet [id number] Yes Take 1 tablet (15 mg total) by mouth daily with supper. Burtis Junes, NP Taking Active   Tamsulosin HCl (FLOMAX) 0.4 MG CAPS [id number] Yes Take 0.4 mg by mouth daily.  [provider] Taking Active Self  vitamin E 400 UNIT capsule [id number] Yes Take 400 Units by mouth daily. [provider] Taking Active Self  Zinc 25 MG TABS [id number] Yes Take 25 mg by mouth daily.  [provider] Taking Active Self          Assessment:  Drugs sorted by system:  Cardiovascular: Amlodipine, hydralazine, hydrochlorothiazide, furosemide, Lovastatin, Metoprolol Succinate, Ramipril, Xarelto  Endocrine: Novolog, Lantus  Genitourinary: Tamsulosin  Vitamins/Minerals/Supplements: Vitamin C, Magnesium, Cholecalciferol, Hawthorne Berry, Potassium Gluconate, Cyanocobalamin, Coenzyme 10, DHEA, Zinc, Vitamin E, Super Beta Prostate, DHEA,Pycnogenol, Omega 3 Fatty Acid, Potassium Gluconate, Zinc   Medication Review Findings:   HgA1c- 6%  LDL 84  Medication Adherence Findings: Adherence Review  [x]  Excellent (no doses missed/week)     []  Good (no more than 1 dose  missed/week)     []  Partial (2-3 doses missed/week) []  Poor (>3 doses missed/week)  Patient with excellent understanding of regimen and excellent understanding of indications.    Medication Assistance Findings:  No medication assistance needs identified  Patient is over income for patient assistance programs.  Extra Help:  Not eligible for Extra Help Low Income Subsidy based on reported income and assets   Additional medication assistance options reviewed with patient as warranted:  Visual merchandiser  Plan: . Will route note to [facility] CSNP Nurse.  . Will follow-up in 3 months.[name], PharmD, [place] Clinical Pharmacist [phone]

## 2019-03-22 NOTE — Addendum Note (Signed)
Addended by: [name] on: 03/22/2019 05:28 PM   Modules accepted: Orders

## 2019-03-24 ENCOUNTER — Ambulatory Visit: Payer: PPO | Admitting: Podiatry

## 2019-03-24 ENCOUNTER — Ambulatory Visit: Payer: Self-pay | Admitting: Pharmacist

## 2019-05-03 ENCOUNTER — Other Ambulatory Visit: Payer: Self-pay

## 2019-05-03 ENCOUNTER — Encounter: Payer: Self-pay | Admitting: Podiatry

## 2019-05-03 ENCOUNTER — Ambulatory Visit: Payer: HMO | Admitting: Podiatry

## 2019-05-03 DIAGNOSIS — L02619 Cutaneous abscess of unspecified foot: Secondary | ICD-10-CM | POA: Diagnosis not present

## 2019-05-03 DIAGNOSIS — L03119 Cellulitis of unspecified part of limb: Secondary | ICD-10-CM

## 2019-05-03 DIAGNOSIS — L03031 Cellulitis of right toe: Secondary | ICD-10-CM

## 2019-05-03 NOTE — Progress Notes (Signed)
This patient presents the office for evaluation of his big toe right foot.  He says that he has had pain redness and drainage coming from the tip of the inside border big toenail right foot.  He says that he has been applying iodine to the nails and he does state that his toe is improving.  He also says that he has been using Vicks VapoRub to apply to all of the toenails on both feet that Dr. Forde Dandy proclaimed as being fungus.  He presents the office today for an evaluation and treatment of this infected nail groove right big toe.  Patient is a diabetic.  He presents the office today for an evaluation and treatment  General Appearance  Alert, conversant and in no acute stress.  Vascular  Dorsalis pedis and posterior tibial  pulses are palpable  bilaterally.  Capillary return is within normal limits  bilaterally. Temperature is within normal limits  bilaterally.  Neurologic  Senn-Weinstein monofilament wire test within normal limits  bilaterally. Muscle power within normal limits bilaterally.  Nails Thick disfigured discolored nails with subungual debris  from hallux to fifth toes bilaterally. Patient has dried abscess noted at the distal aspect medial border right great toe.  Orthopedic  No limitations of motion  feet .  No crepitus or effusions noted.  No bony pathology or digital deformities noted.  Skin  normotropic skin with no porokeratosis noted bilaterally.  No signs of infections or ulcers noted.    Ingrown toenail right hallux  Abscess right hallux  Paronychia right hallux.  Incision and drainage abscess medial border right hallux.  Neosporin/DSD.  RTC prn   Gardiner Barefoot DPM

## 2019-05-05 ENCOUNTER — Ambulatory Visit: Payer: Self-pay | Admitting: Pharmacist

## 2019-05-12 DIAGNOSIS — M25571 Pain in right ankle and joints of right foot: Secondary | ICD-10-CM | POA: Diagnosis not present

## 2019-05-16 DIAGNOSIS — Z125 Encounter for screening for malignant neoplasm of prostate: Secondary | ICD-10-CM | POA: Diagnosis not present

## 2019-05-16 DIAGNOSIS — N183 Chronic kidney disease, stage 3 (moderate): Secondary | ICD-10-CM | POA: Diagnosis not present

## 2019-05-16 DIAGNOSIS — M109 Gout, unspecified: Secondary | ICD-10-CM | POA: Diagnosis not present

## 2019-05-16 DIAGNOSIS — E1029 Type 1 diabetes mellitus with other diabetic kidney complication: Secondary | ICD-10-CM | POA: Diagnosis not present

## 2019-05-16 DIAGNOSIS — C180 Malignant neoplasm of cecum: Secondary | ICD-10-CM | POA: Diagnosis not present

## 2019-05-18 DIAGNOSIS — M109 Gout, unspecified: Secondary | ICD-10-CM | POA: Diagnosis not present

## 2019-05-23 ENCOUNTER — Other Ambulatory Visit: Payer: Self-pay | Admitting: General Surgery

## 2019-05-23 DIAGNOSIS — D171 Benign lipomatous neoplasm of skin and subcutaneous tissue of trunk: Secondary | ICD-10-CM | POA: Diagnosis not present

## 2019-05-31 ENCOUNTER — Other Ambulatory Visit: Payer: Self-pay | Admitting: Nurse Practitioner

## 2019-05-31 NOTE — Telephone Encounter (Signed)
Prescription refill request for Xarelto received.   Last office visit: Truitt Merle (12-07-2018) Weight: 73.9 kg (12-07-2018) Age: 78 y.o. Scr: 1.8 (via [place]  05-16-2019) CrCl: 36 ml/min  Prescription refill sent.

## 2019-06-01 ENCOUNTER — Telehealth: Payer: Self-pay

## 2019-06-01 NOTE — Telephone Encounter (Signed)

## 2019-06-02 ENCOUNTER — Other Ambulatory Visit: Payer: Self-pay

## 2019-06-02 ENCOUNTER — Encounter: Payer: Self-pay | Admitting: Cardiology

## 2019-06-02 ENCOUNTER — Ambulatory Visit: Payer: HMO | Admitting: Cardiology

## 2019-06-02 VITALS — BP 130/80 | HR 61 | Ht 72.0 in | Wt 155.6 lb

## 2019-06-02 DIAGNOSIS — E039 Hypothyroidism, unspecified: Secondary | ICD-10-CM | POA: Diagnosis not present

## 2019-06-02 DIAGNOSIS — E785 Hyperlipidemia, unspecified: Secondary | ICD-10-CM | POA: Diagnosis not present

## 2019-06-02 DIAGNOSIS — I480 Paroxysmal atrial fibrillation: Secondary | ICD-10-CM

## 2019-06-02 DIAGNOSIS — N401 Enlarged prostate with lower urinary tract symptoms: Secondary | ICD-10-CM | POA: Diagnosis not present

## 2019-06-02 DIAGNOSIS — I5189 Other ill-defined heart diseases: Secondary | ICD-10-CM | POA: Diagnosis not present

## 2019-06-02 DIAGNOSIS — L089 Local infection of the skin and subcutaneous tissue, unspecified: Secondary | ICD-10-CM | POA: Diagnosis not present

## 2019-06-02 DIAGNOSIS — Z7901 Long term (current) use of anticoagulants: Secondary | ICD-10-CM | POA: Diagnosis not present

## 2019-06-02 DIAGNOSIS — C180 Malignant neoplasm of cecum: Secondary | ICD-10-CM | POA: Diagnosis not present

## 2019-06-02 DIAGNOSIS — I129 Hypertensive chronic kidney disease with stage 1 through stage 4 chronic kidney disease, or unspecified chronic kidney disease: Secondary | ICD-10-CM | POA: Diagnosis not present

## 2019-06-02 DIAGNOSIS — E1029 Type 1 diabetes mellitus with other diabetic kidney complication: Secondary | ICD-10-CM | POA: Diagnosis not present

## 2019-06-02 DIAGNOSIS — I10 Essential (primary) hypertension: Secondary | ICD-10-CM

## 2019-06-02 DIAGNOSIS — I739 Peripheral vascular disease, unspecified: Secondary | ICD-10-CM | POA: Diagnosis not present

## 2019-06-02 DIAGNOSIS — N183 Chronic kidney disease, stage 3 (moderate): Secondary | ICD-10-CM | POA: Diagnosis not present

## 2019-06-02 NOTE — Progress Notes (Signed)
PCP:  Reynold Bowen, MD  Cardiologist:  No primary care provider on file.  Electrophysiologist:  None   Evaluation Performed:  Follow-Up Visit  Chief Complaint: Gout  History of Present Illness:    Curtis Dunn is a 78 y.o. male with paroxysmal atrial fibrillation during a nuclear stress test 2006 no ischemia.  Former Emergency planning/management officer.  Various orthopedic issues.  Last EF 65% on echo.  GOUT - is on HCTZ. Needs to stop.  Right side ankle  151/67, 140/69.  Blood pressure certainly to be a little bit elevated with pain.  The patient does not have symptoms concerning for COVID-19 infection (fever, chills, cough, or new shortness of breath).    Past Medical History:  Diagnosis Date  . Broken neck ([place])    in March 2015  . Cataract   . Colon cancer ([place])   . Colon polyp   . Diabetes mellitus   . Dysrhythmia    a fib  . Hyperlipidemia   . Hypertension   . Paroxysmal atrial fibrillation [facility])    Past Surgical History:  Procedure Laterality Date  . ANKLE SURGERY     right  . APPENDECTOMY  2001  . arm surgery     right shoulder surgery  . CATARACT EXTRACTION EXTRACAPSULAR Bilateral   . COLON RESECTION    . ELBOW FRACTURE SURGERY    . INGUINAL HERNIA REPAIR Left 03/13/2016   Procedure: LEFT INGUINAL HERNIA REPAIR WITH UNBILICAL HERNIA REPAIR;  Surgeon: [name], MD;  Location: [place];  Service: General;  Laterality: Left;  . INSERTION OF MESH Left 03/13/2016   Procedure: INSERTION OF MESH;  Surgeon: [name], MD;  Location: [place];  Service: General;  Laterality: Left;  [name] VASECTOMY       Current Meds  Medication Sig  . amLODipine (NORVASC) 5 MG tablet Take 1 tablet (5 mg total) by mouth daily.  . Ascorbic Acid (VITAMIN C) 1000 MG tablet Take 1,000 mg by mouth daily.  . Cholecalciferol (VITAMIN D3) 250 MCG (10000 UT) capsule Take 1 capsule by mouth daily.   . Coenzyme Q-10 200 MG CAPS Take 1 capsule by mouth daily.  . colchicine 0.6 MG tablet Take 0.6 mg  by mouth daily. as directed  . Continuous Blood Gluc Sensor (FREESTYLE LIBRE 14 DAY SENSOR) MISC USE TO MONITOR BLOOD GLUCOSE. CHANGE EVERY 14 DAYS.  [name] Cyanocobalamin 5000 MCG TBDP Take 1 tablet by mouth daily.  . furosemide (LASIX) 40 MG tablet Take 20 mg by mouth daily.  [name] Hawthorne Berry 550 MG CAPS Take 550 mg by mouth daily.  . hydrALAZINE (APRESOLINE) 50 MG tablet Take 1 tablet (50 mg total) by mouth 2 (two) times daily.  . insulin aspart (NOVOLOG) 100 UNIT/ML injection Inject into the skin 3 (three) times daily before meals. Inject 12 units at breakfast, 10 units at lunch and 10 units at supper  . insulin glargine (LANTUS) 100 UNIT/ML injection Inject 14 units at breakfast and 12 units at bedtime  . lovastatin (MEVACOR) 20 MG tablet Take 20 mg by mouth at bedtime.  . Magnesium ([employer]S TRIPLE MAGNESIUM COMPLEX) 400 MG CAPS Take 1 capsule by mouth daily.  . metoprolol succinate (TOPROL-XL) 25 MG 24 hr tablet TAKE 1 TABLET BY MOUTH ONCE DAILY  . Misc Natural Products (BETA-SITOSTEROL PLANT STEROLS PO) Take 1 tablet by mouth 2 (two) times a day. PRODUCT NAME: SUPER BETA PROSTATE (beta sitosterol, lycopene, reishi mushroom)  . Nutritional Supplements (DHEA PO) Take 100 mg  by mouth daily.  . Nutritional Supplements (PYCNOGENOL PO) Take 100 mg by mouth daily.  . Omega 3 1200 MG CAPS Take 1 capsule by mouth daily.  . Potassium 99 MG TABS Take 1 tablet by mouth daily.   . ramipril (ALTACE) 10 MG capsule Take 1 capsule (10 mg total) by mouth 2 (two) times daily.  . Tamsulosin HCl (FLOMAX) 0.4 MG CAPS Take 0.4 mg by mouth daily.   . vitamin E 1000 UNIT capsule Take 1,000 Units by mouth daily.   Alveda Reasons 15 MG TABS tablet TAKE 1 TABLET BY MOUTH ONCE DAILY WITH SUPPER  . Zinc 50 MG CAPS Take 1 tablet by mouth daily.   . [DISCONTINUED] hydrochlorothiazide (HYDRODIURIL) 25 MG tablet Take 25 mg by mouth daily.     Allergies:   Patient has no known allergies.   Social History   Tobacco Use  .  Smoking status: Never Smoker  . Smokeless tobacco: Never Used  . Tobacco comment: occasional cigar  Substance Use Topics  . Alcohol use: No  . Drug use: No     Family Hx: The patient's family history includes Heart disease in his father. There is no history of Colon cancer, Esophageal cancer, Rectal cancer, or Stomach cancer.  ROS:   Please see the history of present illness.     All other systems reviewed and are negative.   Prior CV studies:   The following studies were reviewed today:  Normal EF  Labs/Other Tests and Data Reviewed:    EKG:  No new  Recent Labs: 11/01/2018: Hemoglobin 14.5; Platelets 202 12/07/2018: BUN 27; Creatinine, Ser 1.06; Potassium 4.2; Sodium 146   Recent Lipid Panel Lab Results  Component Value Date/Time   CHOL 148 02/14/2014 07:16 AM   TRIG 106 02/14/2014 07:16 AM   HDL 48 02/14/2014 07:16 AM   CHOLHDL 3.1 02/14/2014 07:16 AM   LDLCALC 79 02/14/2014 07:16 AM    Wt Readings from Last 3 Encounters:  06/02/19 155 lb 9.6 oz (70.6 kg)  12/07/18 163 lb (73.9 kg)  11/01/18 163 lb 12.8 oz (74.3 kg)     Objective:    Vital Signs:  BP 130/80   Pulse 61   Ht 6' (1.829 m)   Wt 155 lb 9.6 oz (70.6 kg)   SpO2 98%   BMI 21.10 kg/m    VITAL SIGNS:  reviewed GEN:  no acute distress EYES:  sclerae anicteric, EOMI - Extraocular Movements Intact RESPIRATORY:  normal respiratory effort, symmetric expansion CARDIOVASCULAR:  no peripheral edema SKIN:  no rash, lesions or ulcers. MUSCULOSKELETAL:  no obvious deformities. NEURO:  alert and oriented x 3, no obvious focal deficit PSYCH:  normal affect   Gout noted right ankle ASSESSMENT & PLAN:    Gout  - "Terrible". Right foot. Dr. Forde Dandy. Dr. Marga Melnick saw as well. Plate and screws in right ankle. No fracture. Did not think cellulitis. Dx with gout. No ETOH. Rare meat. Uric acid 8. Wanted to look at HCTZ. Stop HCTZ.  Continue with Dr. Forde Dandy plan.   PAF -Sinus rhythm last check.   Anticoagulation.  No changes made.  Xarelto 15. Watch for bleeding.   Diabetes with hypertension -Followed by Dr. Carrolyn Meiers.  Trauma from hypoglycemic fall with flail chest ankle fracture AKI in the past.  Hyperlipidemia -Statin therapy.  COVID-19 Education: The signs and symptoms of COVID-19 were discussed with the patient and how to seek care for testing (follow up with PCP or arrange E-visit).  The  importance of social distancing was discussed today.    Medication Adjustments/Labs and Tests Ordered: Current medicines are reviewed at length with the patient today.  Concerns regarding medicines are outlined above.   Tests Ordered: No orders of the defined types were placed in this encounter.   Medication Changes: No orders of the defined types were placed in this encounter.   Follow Up:  Virtual Visit or In Person in 6 month(s)  Signed, [name], MD  06/02/2019 6:06 PM    [place]

## 2019-06-02 NOTE — Patient Instructions (Signed)
Medication Instructions:  You may discontinue your HCTZ. Continue all other medications as listed.  If you need a refill on your cardiac medications before your next appointment, please call your pharmacy.   Follow-Up: At [facility], you and your health needs are our priority.  As part of our continuing mission to provide you with exceptional heart care, we have created designated Provider Care Teams.  These Care Teams include your primary Cardiologist (physician) and Advanced Practice Providers (APPs -  Physician Assistants and Nurse Practitioners) who all work together to provide you with the care you need, when you need it. You will need a follow up appointment in 6 months with Truitt Merle, NP and Dr Marlou Porch.  Please call our office 2 months in advance to schedule this appointment.  You may see Dr Marlou Porch. or one of the following Advanced Practice Providers on your designated Care Team:   Truitt Merle, NP Cecilie Kicks, NP . Kathyrn Drown, NP  Thank you for choosing [facility]!!

## 2019-06-10 ENCOUNTER — Other Ambulatory Visit: Payer: Self-pay | Admitting: *Deleted

## 2019-06-10 ENCOUNTER — Encounter: Payer: Self-pay | Admitting: *Deleted

## 2019-06-10 NOTE — Patient Outreach (Addendum)
[facility]) Care Management Chronic Special Needs Program  06/10/2019  Name: MATTHAN SLEDGE DOB: 30-Nov-1940  MRN: [id number]  Mr. Susumu Hackler is enrolled in a chronic special needs plan for Diabetes. Chronic Care Management Coordinator telephoned client to review health risk assessment and to develop individualized care plan.  Introduced the chronic care management program, importance of client participation, and taking their care plan to all provider appointments and inpatient facilities.  Reviewed the transition of care process and possible referral to community care management.  Subjective: Mr. Nakama states he has been suffering for several weeks with his first gout attack. He reports excruciating pain in his right ankle that disabled him from walking and doing his routine work out at the [employer]. He says the pain is getting better as he is taking colchicine and two over the counter supplements his daughter ordered for him on Amazon, Uric Acid Flush and Cherry Fruit Extract. He says he will start taking allopurinol on 06/16/19 or after the gout exacerbation has subsided. He says the gout exacerbation has also increased his insulin requirements by about 50%.  He reports his Hgb A1C as 5.8%  and says it has been 6.0% or less for many years. He says he wears a Freestyle Libre flash glucose monitor and scans his sensor 6-8 times daily. He reports  hypoglycemia of <60 about 3 times weekly. He says he has not required assistance in treating a low blood sugar for many years. He says he is adherent with his basal and mealtime insulin and sees Dr Forde Dandy every 4 months.  He says his right big toe abscess has healed after it was treated by a podiatrist in mid June. He says his atrial fibrillation is stable and he has never had symptoms. He says he reviewed the information sent to him in March by this RNCM on the dysrhythmia. He says he is adherent with his Xarelto but  does report frequent purple-reddish bruising on his arms and legs. He says he has a home blood pressure monitor and his blood pressure usually meet his target of <135/<80.He says he read the material that he received from this [facility] in March on the correct way to take a home blood pressure reading. He says he sess his cardiologist 2 times yearly. He states he does not have trouble affording his medication despite the large number. He says he received the advance directive package and wants to complete a directive as soon as he feels better. He says he will name his daughter as his health care power of attorney. He says he lives alone with his cat, has an adult son and daughter and a girlfriend in [place]. Reports his wife died 73 years ago from leukemia and that he still misses her everyday.   Goals Addressed            This Visit's Progress    Ask Dr Forde Dandy about the [facility] 2 with real time glucose alarms at the November office visit      .  Acknowledge receipt of Advanced Directive package   On track   . Advanced Care Planning complete by January 2021      . Client understands the importance of follow-up with providers by attending scheduled visits   On track   . Client will report no worsening of symptoms of Atrial Fibrillation within the next 4 months   On track   . Client will verbalize knowledge of self management of  Hypertension as evidences by BP reading of 140/90 or less; or as defined by provider   On track   . Maintain timely refills of diabetic medication as prescribed within the year .   On track   . Obtain annual  Lipid Profile, LDL-C   On track   . Obtain Annual Eye (retinal)  Exam    On track   . Obtain Annual Foot Exam   On track   . Obtain Hemoglobin A1C at least 2 times per year   On track   . Visit Primary Care Provider or Endocrinologist at least 2 times per year    On track     Assessment: Client is meeting diabetes self-management goal of hemoglobin A1C of  <7% with last reading of 5.8% on 05/16/19 with reports of nonsevere hypoglycemia 3 times weekly.  Interventions: Reviewed how to take blood pressure at home. reviewed readings and target. Reviewed symptoms of atrial fibrillation and common side effects of Xarelto and side effects requiring provider notification. Reviewed how to treat and reduce hypoglycemic episodes- encouraged him to speak with Dr Forde Dandy about the [facility] 2 with real time glucose alarms. Reviewed importance of completing an advance directive and discussed the 2 witness waiver during the current Covid 19 pandemic.  Reviewed COVID-19 cause, symptoms, precautions (social distancing, stay at home order, hand washing, wear face covering when unable to maintain or ensure 6 foot social distancing), and symptoms requiring provider notification.  Plan:   Send successful outreach letter with a copy of client's updated individualized care plan to client and primary care provider. Chronic care management coordination will outreach in:  3 Months    Ivor, [place], [place] Management Coordinator [place] [facility] [phone]

## 2019-06-21 ENCOUNTER — Ambulatory Visit: Payer: Self-pay | Admitting: Pharmacist

## 2019-06-21 ENCOUNTER — Other Ambulatory Visit: Payer: Self-pay | Admitting: Pharmacist

## 2019-06-21 NOTE — Patient Outreach (Addendum)
[facility]) Care Management  06/21/2019  Curtis Dunn [birth year] [id number]   Patient was called regarding CSNP follow up. Unfortunately, he did not answer his phone. HIPAA compliant message was left on his voicemail.  Patient spoke with his [name] on 06/10/2019 and did not report any medication issues.  Plan: Call patient back in 6-8 weeks.   [name], PharmD, [place] Clinical Pharmacist [phone]  ADDENDUM  Patient called back. HIPAA identifiers were obtained.  Patient reported feeling fine. He said he recently had a gout flare. He is just about to finish up Colchicine therapy. Taking Allopurinol daily now.  He received the free coupon for Novolog.  Patient in over income for patient assistance.  Patient is a 78 year old male with multiple medical conditions including but not limited to:  Type 1 diabetes, hypertension, hyperlipidemia, and Atrial Fibrillation.  Patient said his pain was 0/10 today.  HgA1c 5.8% ([facility]) On statin (Lovastatin) last filled 02/12/19 & 05/10/19.  Patient's medications were reviewed via telephone:  Medications Reviewed Today    Reviewed by Barrington Ellison, RN (Registered Nurse) on 06/10/19 at 9  Med List Status: <None>  Medication Order Taking? Sig Documenting Provider Last Dose Status Informant  allopurinol (ZYLOPRIM) 100 MG tablet [id number] No Take 100 mg by mouth daily. [provider] Not Taking Active            Med Note [name], [name]   Fri Jun 10, 2019  3:16 PM) States he will start taking on 06/16/19 after current gout exacerbation subsides  amLODipine (NORVASC) 5 MG tablet [id number] Yes Take 1 tablet (5 mg total) by mouth daily. Burtis Junes, NP Taking Active   Ascorbic Acid (VITAMIN C) 1000 MG tablet [id number] Yes Take 1,000 mg by mouth daily. [provider] Taking Active Self  Cholecalciferol (VITAMIN D3) 250 MCG (10000 UT) capsule [id number] Yes Take 1 capsule  by mouth daily.  [provider] Taking Active   Coenzyme Q-10 200 MG CAPS [id number] Yes Take 1 capsule by mouth daily. [provider] Taking Active   colchicine 0.6 MG tablet [id number] Yes Take 0.6 mg by mouth daily. as directed [provider] Taking Active   Continuous Blood Gluc Sensor (FREESTYLE LIBRE 14 DAY SENSOR) [place] [id number] Yes USE TO MONITOR BLOOD GLUCOSE. CHANGE EVERY 14 DAYS. [provider] Taking Active   Cyanocobalamin 5000 MCG TBDP [id number] Yes Take 1 tablet by mouth daily. [provider] Taking Active   furosemide (LASIX) 40 MG tablet [id number] Yes Take 20 mg by mouth daily. Anda Kraft, MD Taking Active   Hawthorne Berry 550 MG CAPS [id number] Yes Take 550 mg by mouth daily. Jerline Pain, MD Taking Active   hydrALAZINE (APRESOLINE) 50 MG tablet [id number]  Take 1 tablet (50 mg total) by mouth 2 (two) times daily. Truitt Merle C, NP  Expired 06/02/19 2359   insulin aspart (NOVOLOG) 100 UNIT/ML injection [id number] Yes Inject into the skin 3 (three) times daily before meals. Inject 12 units at breakfast, 10 units at lunch and 10 units at supper [provider] Taking Active   insulin glargine (LANTUS) 100 UNIT/ML injection [id number] Yes Inject 14 units at breakfast and 12 units at bedtime [provider] Taking Active Self           Med Note Warner Mccreedy, CHASITIE R   Tue Sep 16, 2016  8:38 AM)    lovastatin (MEVACOR) 20 MG tablet [id number] Yes Take  20 mg by mouth at bedtime. [provider] Taking Active Self  Magnesium (CVS TRIPLE MAGNESIUM COMPLEX) 400 MG CAPS [id number] Yes Take 1 capsule by mouth daily. [provider] Taking Active   metoprolol succinate (TOPROL-XL) 25 MG 24 hr tablet [id number] Yes TAKE 1 TABLET BY MOUTH ONCE DAILY Jerline Pain, MD Taking Active   Misc Natural Products (BETA-SITOSTEROL PLANT STEROLS PO) [id number] Yes Take 1 tablet by mouth 2 (two) times a day. PRODUCT NAME: SUPER  BETA PROSTATE (beta sitosterol, lycopene, reishi mushroom) [provider] Taking Active   Nutritional Supplements (DHEA PO) [id number] Yes Take 100 mg by mouth daily. [provider] Taking Active   Nutritional Supplements (PYCNOGENOL PO) [id number] Yes Take 100 mg by mouth daily. [provider] Taking Active   Omega 3 1200 MG CAPS [id number] Yes Take 1 capsule by mouth daily. [provider] Taking Active   OVER THE COUNTER MEDICATION [id number] Yes Take 2 capsules by mouth daily. Uric Acid Flush [provider] Taking Active Self           Med Note [name], [name]   Fri Jun 10, 2019  3:23 PM) Uric Acid Flush  OVER THE COUNTER MEDICATION [id number] Yes Take 2 capsules by mouth 3 (three) times daily. Cherry Fruit Extract [provider] Taking Active Self           Med Note [name], [name]   Fri Jun 10, 2019  3:26 PM) Marcelline Mates Fruit Extract  Potassium 99 MG TABS [id number] Yes Take 1 tablet by mouth daily.  [provider] Taking Active Self  ramipril (ALTACE) 10 MG capsule [id number] Yes Take 1 capsule (10 mg total) by mouth 2 (two) times daily. Burtis Junes, NP Taking Active   Tamsulosin HCl (FLOMAX) 0.4 MG CAPS [id number] Yes Take 0.4 mg by mouth daily.  [provider] Taking Active Self  vitamin E 1000 UNIT capsule [id number] Yes Take 1,000 Units by mouth daily.  [provider] Taking Active Self  XARELTO 15 MG TABS tablet [id number]  TAKE 1 TABLET BY MOUTH ONCE DAILY WITH SUPPER Burtis Junes, NP  Active   Zinc 50 MG CAPS [id number] Yes Take 1 tablet by mouth daily.  [provider] Taking Active Self          Plan: Call patient back in 3 months for follow up.  [name], PharmD, [place] Clinical Pharmacist [phone]

## 2019-06-30 ENCOUNTER — Other Ambulatory Visit: Payer: Self-pay | Admitting: Cardiology

## 2019-06-30 DIAGNOSIS — I480 Paroxysmal atrial fibrillation: Secondary | ICD-10-CM

## 2019-07-13 ENCOUNTER — Other Ambulatory Visit: Payer: Self-pay

## 2019-07-13 ENCOUNTER — Telehealth: Payer: Self-pay | Admitting: Cardiology

## 2019-07-13 ENCOUNTER — Encounter (HOSPITAL_BASED_OUTPATIENT_CLINIC_OR_DEPARTMENT_OTHER): Payer: Self-pay | Admitting: *Deleted

## 2019-07-13 NOTE — Telephone Encounter (Signed)
   Primary Cardiologist: [name], MD  Chart reviewed as part of pre-operative protocol coverage. Patient was contacted 07/13/2019 in reference to pre-operative risk assessment for pending surgery as outlined below.  Curtis Dunn was last seen on 06/02/19 by Dr. Marlou Porch.  Since that day, Curtis Dunn has done well.  He does not have a history of CAD or MI and can complete more than 4.0 METS without anginal symptom.   Per our clinical pharmacist team: Patient with diagnosis of afib on Xarelto for anticoagulation.    Procedure: Excision of Back Mass Date of procedure: 07/20/2019  CHADS2-VASc score of  4 (HTN, AGE, DM2, AGE)  CrCl 36 ml/min  Per office protocol, patient can hold Xarelto for 2 days prior to procedure  Therefore, based on ACC/AHA guidelines, the patient would be at acceptable risk for the planned procedure without further cardiovascular testing.   I will route this recommendation to the requesting party via Epic fax function and remove from pre-op pool.  Please call with questions.  [place], PA 07/13/2019, 5:08 PM

## 2019-07-13 NOTE — Telephone Encounter (Signed)
New message     [facility] Pre-operative Risk Assessment    Request for surgical clearance:  1. What type of surgery is being performed? Excision of Back Mass  2. When is this surgery scheduled? 07/20/19   3. What type of clearance is required (medical clearance vs. Pharmacy clearance to hold med vs. Both)? Both  4. Are there any medications that need to be held prior to surgery and how long? Holding Xerelto  5. Practice name and name of physician performing surgery? [facility]; Dr Rolm Bookbinder  6. What is your office phone number [phone]   7.   What is your office fax number [phone]  8.   Anesthesia type (None, local, MAC, general) ? General   Jenkins Rouge 07/13/2019, 3:27 PM  _________________________________________________________________   (provider comments below)

## 2019-07-13 NOTE — Telephone Encounter (Signed)
Patient with diagnosis of afib on Xarelto for anticoagulation.    Procedure: Excision of Back Mass Date of procedure: 07/20/2019  CHADS2-VASc score of  4 (HTN, AGE, DM2, AGE)  CrCl 36 ml/min  Per office protocol, patient can hold Xarelto for 2 days prior to procedure.

## 2019-07-13 NOTE — Telephone Encounter (Signed)
Please comment on xarelto. 

## 2019-07-14 ENCOUNTER — Encounter (HOSPITAL_BASED_OUTPATIENT_CLINIC_OR_DEPARTMENT_OTHER)
Admission: RE | Admit: 2019-07-14 | Discharge: 2019-07-14 | Disposition: A | Discharge status: Home / Self Care | Payer: HMO | Source: Ambulatory Visit | Attending: General Surgery | Admitting: General Surgery

## 2019-07-14 DIAGNOSIS — E108 Type 1 diabetes mellitus with unspecified complications: Secondary | ICD-10-CM | POA: Diagnosis not present

## 2019-07-14 DIAGNOSIS — M109 Gout, unspecified: Secondary | ICD-10-CM | POA: Diagnosis not present

## 2019-07-14 DIAGNOSIS — M79671 Pain in right foot: Secondary | ICD-10-CM | POA: Diagnosis not present

## 2019-07-14 DIAGNOSIS — I480 Paroxysmal atrial fibrillation: Secondary | ICD-10-CM | POA: Diagnosis not present

## 2019-07-14 DIAGNOSIS — Z9842 Cataract extraction status, left eye: Secondary | ICD-10-CM | POA: Diagnosis not present

## 2019-07-14 DIAGNOSIS — H40013 Open angle with borderline findings, low risk, bilateral: Secondary | ICD-10-CM | POA: Diagnosis not present

## 2019-07-14 DIAGNOSIS — Z8249 Family history of ischemic heart disease and other diseases of the circulatory system: Secondary | ICD-10-CM | POA: Diagnosis not present

## 2019-07-14 DIAGNOSIS — Z20828 Contact with and (suspected) exposure to other viral communicable diseases: Secondary | ICD-10-CM | POA: Insufficient documentation

## 2019-07-14 DIAGNOSIS — Z85038 Personal history of other malignant neoplasm of large intestine: Secondary | ICD-10-CM | POA: Diagnosis not present

## 2019-07-14 DIAGNOSIS — Z79899 Other long term (current) drug therapy: Secondary | ICD-10-CM | POA: Diagnosis not present

## 2019-07-14 DIAGNOSIS — I491 Atrial premature depolarization: Secondary | ICD-10-CM | POA: Diagnosis not present

## 2019-07-14 DIAGNOSIS — Z01812 Encounter for preprocedural laboratory examination: Secondary | ICD-10-CM | POA: Diagnosis not present

## 2019-07-14 DIAGNOSIS — E785 Hyperlipidemia, unspecified: Secondary | ICD-10-CM | POA: Diagnosis not present

## 2019-07-14 DIAGNOSIS — Z9841 Cataract extraction status, right eye: Secondary | ICD-10-CM | POA: Diagnosis not present

## 2019-07-14 DIAGNOSIS — Z794 Long term (current) use of insulin: Secondary | ICD-10-CM | POA: Diagnosis not present

## 2019-07-14 DIAGNOSIS — Z7901 Long term (current) use of anticoagulants: Secondary | ICD-10-CM | POA: Diagnosis not present

## 2019-07-14 DIAGNOSIS — H02834 Dermatochalasis of left upper eyelid: Secondary | ICD-10-CM | POA: Diagnosis not present

## 2019-07-14 DIAGNOSIS — I10 Essential (primary) hypertension: Secondary | ICD-10-CM | POA: Diagnosis not present

## 2019-07-14 DIAGNOSIS — E103293 Type 1 diabetes mellitus with mild nonproliferative diabetic retinopathy without macular edema, bilateral: Secondary | ICD-10-CM | POA: Diagnosis not present

## 2019-07-14 DIAGNOSIS — D171 Benign lipomatous neoplasm of skin and subcutaneous tissue of trunk: Secondary | ICD-10-CM | POA: Diagnosis not present

## 2019-07-14 DIAGNOSIS — H524 Presbyopia: Secondary | ICD-10-CM | POA: Diagnosis not present

## 2019-07-14 DIAGNOSIS — Z9049 Acquired absence of other specified parts of digestive tract: Secondary | ICD-10-CM | POA: Diagnosis not present

## 2019-07-14 DIAGNOSIS — H02831 Dermatochalasis of right upper eyelid: Secondary | ICD-10-CM | POA: Diagnosis not present

## 2019-07-14 LAB — BASIC METABOLIC PANEL
Anion gap: 8 (ref 5–15)
BUN: 21 mg/dL (ref 8–23)
CO2: 28 mmol/L (ref 22–32)
Calcium: 9.5 mg/dL (ref 8.9–10.3)
Chloride: 102 mmol/L (ref 98–111)
Creatinine, Ser: 1.18 mg/dL (ref 0.61–1.24)
GFR calc Af Amer: >60 mL/min (ref >60)
GFR calc non Af Amer: 59 mL/min — ABNORMAL LOW (ref >60)
Glucose, Bld: 194 mg/dL — ABNORMAL HIGH (ref 70–99)
Potassium: 4.9 mmol/L (ref 3.5–5.1)
Sodium: 138 mmol/L (ref 135–145)

## 2019-07-14 NOTE — Progress Notes (Signed)

## 2019-07-15 NOTE — Progress Notes (Signed)
Per Dr Marlou Porch, pt is to hold Xarelto 2d prior to surgery. His last dose will be Sunday 07-16-19. I called patient to let him know this info.

## 2019-07-16 ENCOUNTER — Other Ambulatory Visit (HOSPITAL_COMMUNITY)
Admission: RE | Admit: 2019-07-16 | Discharge: 2019-07-16 | Disposition: A | Discharge status: Home / Self Care | Payer: HMO | Source: Ambulatory Visit | Attending: General Surgery | Admitting: General Surgery

## 2019-07-16 DIAGNOSIS — Z01812 Encounter for preprocedural laboratory examination: Secondary | ICD-10-CM | POA: Diagnosis not present

## 2019-07-16 LAB — SARS CORONAVIRUS 2 (TAT 6-24 HRS): SARS Coronavirus 2: NEGATIVE

## 2019-07-20 ENCOUNTER — Ambulatory Visit (HOSPITAL_BASED_OUTPATIENT_CLINIC_OR_DEPARTMENT_OTHER)
Admission: RE | Admit: 2019-07-20 | Discharge: 2019-07-20 | Disposition: A | Discharge status: Home / Self Care | Payer: HMO | Attending: General Surgery | Admitting: General Surgery

## 2019-07-20 ENCOUNTER — Encounter (HOSPITAL_BASED_OUTPATIENT_CLINIC_OR_DEPARTMENT_OTHER): Payer: Self-pay | Admitting: Certified Registered"

## 2019-07-20 ENCOUNTER — Ambulatory Visit (HOSPITAL_BASED_OUTPATIENT_CLINIC_OR_DEPARTMENT_OTHER): Payer: HMO | Admitting: Certified Registered"

## 2019-07-20 ENCOUNTER — Encounter (HOSPITAL_BASED_OUTPATIENT_CLINIC_OR_DEPARTMENT_OTHER)
Admission: RE | Disposition: A | Discharge status: Home / Self Care | Payer: Self-pay | Source: Home / Self Care | Attending: General Surgery

## 2019-07-20 ENCOUNTER — Other Ambulatory Visit: Payer: Self-pay

## 2019-07-20 DIAGNOSIS — M109 Gout, unspecified: Secondary | ICD-10-CM | POA: Insufficient documentation

## 2019-07-20 DIAGNOSIS — Z79899 Other long term (current) drug therapy: Secondary | ICD-10-CM | POA: Insufficient documentation

## 2019-07-20 DIAGNOSIS — I491 Atrial premature depolarization: Secondary | ICD-10-CM | POA: Insufficient documentation

## 2019-07-20 DIAGNOSIS — Z794 Long term (current) use of insulin: Secondary | ICD-10-CM | POA: Insufficient documentation

## 2019-07-20 DIAGNOSIS — M79671 Pain in right foot: Secondary | ICD-10-CM | POA: Insufficient documentation

## 2019-07-20 DIAGNOSIS — Z9842 Cataract extraction status, left eye: Secondary | ICD-10-CM | POA: Insufficient documentation

## 2019-07-20 DIAGNOSIS — E785 Hyperlipidemia, unspecified: Secondary | ICD-10-CM | POA: Diagnosis not present

## 2019-07-20 DIAGNOSIS — Z9841 Cataract extraction status, right eye: Secondary | ICD-10-CM | POA: Insufficient documentation

## 2019-07-20 DIAGNOSIS — E108 Type 1 diabetes mellitus with unspecified complications: Secondary | ICD-10-CM | POA: Insufficient documentation

## 2019-07-20 DIAGNOSIS — E109 Type 1 diabetes mellitus without complications: Secondary | ICD-10-CM | POA: Diagnosis not present

## 2019-07-20 DIAGNOSIS — Z8249 Family history of ischemic heart disease and other diseases of the circulatory system: Secondary | ICD-10-CM | POA: Insufficient documentation

## 2019-07-20 DIAGNOSIS — I480 Paroxysmal atrial fibrillation: Secondary | ICD-10-CM | POA: Insufficient documentation

## 2019-07-20 DIAGNOSIS — Z7901 Long term (current) use of anticoagulants: Secondary | ICD-10-CM | POA: Insufficient documentation

## 2019-07-20 DIAGNOSIS — D171 Benign lipomatous neoplasm of skin and subcutaneous tissue of trunk: Secondary | ICD-10-CM | POA: Diagnosis not present

## 2019-07-20 DIAGNOSIS — R222 Localized swelling, mass and lump, trunk: Secondary | ICD-10-CM | POA: Diagnosis not present

## 2019-07-20 DIAGNOSIS — I10 Essential (primary) hypertension: Secondary | ICD-10-CM | POA: Diagnosis not present

## 2019-07-20 DIAGNOSIS — Z85038 Personal history of other malignant neoplasm of large intestine: Secondary | ICD-10-CM | POA: Insufficient documentation

## 2019-07-20 DIAGNOSIS — Z9049 Acquired absence of other specified parts of digestive tract: Secondary | ICD-10-CM | POA: Insufficient documentation

## 2019-07-20 HISTORY — DX: Benign lipomatous neoplasm of skin and subcutaneous tissue of trunk: D17.1

## 2019-07-20 HISTORY — DX: Gout, unspecified: M10.9

## 2019-07-20 HISTORY — PX: MASS EXCISION: SHX2000

## 2019-07-20 LAB — GLUCOSE, CAPILLARY
Glucose-Capillary: 161 mg/dL — ABNORMAL HIGH (ref 70–99)
Glucose-Capillary: 185 mg/dL — ABNORMAL HIGH (ref 70–99)

## 2019-07-20 SURGERY — EXCISION MASS
Anesthesia: Monitor Anesthesia Care | Site: Back

## 2019-07-20 MED ORDER — LIDOCAINE HCL (CARDIAC) PF 100 MG/5ML IV SOSY
PREFILLED_SYRINGE | INTRAVENOUS | Status: DC | PRN
  Administered 2019-07-20: 60 mg via INTRAVENOUS

## 2019-07-20 MED ORDER — FENTANYL CITRATE (PF) 100 MCG/2ML IJ SOLN: 25.0000 ug | INTRAMUSCULAR | Status: DC | PRN

## 2019-07-20 MED ORDER — ACETAMINOPHEN 500 MG PO TABS
ORAL_TABLET | ORAL | Status: AC
  Filled 2019-07-20: qty 2

## 2019-07-20 MED ORDER — LACTATED RINGERS IV SOLN: INTRAVENOUS | Status: DC

## 2019-07-20 MED ORDER — CEFAZOLIN SODIUM-DEXTROSE 2-4 GM/100ML-% IV SOLN
2.0000 g | INTRAVENOUS | Status: AC
  Administered 2019-07-20: 2 g via INTRAVENOUS

## 2019-07-20 MED ORDER — LIDOCAINE-EPINEPHRINE (PF) 1 %-1:200000 IJ SOLN
INTRAMUSCULAR | Status: AC
  Filled 2019-07-20: qty 30

## 2019-07-20 MED ORDER — SCOPOLAMINE 1 MG/3DAYS TD PT72: 1.0000 | MEDICATED_PATCH | Freq: Once | TRANSDERMAL | Status: DC

## 2019-07-20 MED ORDER — MEPERIDINE HCL 25 MG/ML IJ SOLN: 6.2500 mg | INTRAMUSCULAR | Status: DC | PRN

## 2019-07-20 MED ORDER — ACETAMINOPHEN 500 MG PO TABS: 1000.0000 mg | ORAL_TABLET | ORAL | Status: DC

## 2019-07-20 MED ORDER — LIDOCAINE-EPINEPHRINE (PF) 1 %-1:200000 IJ SOLN
INTRAMUSCULAR | Status: DC | PRN
  Administered 2019-07-20: 14 mL via SUBCUTANEOUS

## 2019-07-20 MED ORDER — CEFAZOLIN SODIUM-DEXTROSE 2-4 GM/100ML-% IV SOLN
INTRAVENOUS | Status: AC
  Filled 2019-07-20: qty 100

## 2019-07-20 MED ORDER — ONDANSETRON HCL 4 MG/2ML IJ SOLN
INTRAMUSCULAR | Status: DC | PRN
  Administered 2019-07-20: 4 mg via INTRAVENOUS

## 2019-07-20 MED ORDER — MIDAZOLAM HCL 2 MG/2ML IJ SOLN: 1.0000 mg | INTRAMUSCULAR | Status: DC | PRN

## 2019-07-20 MED ORDER — LACTATED RINGERS IV SOLN
INTRAVENOUS | Status: DC
  Administered 2019-07-20: 08:00:00 via INTRAVENOUS

## 2019-07-20 MED ORDER — METOCLOPRAMIDE HCL 5 MG/ML IJ SOLN: 10.0000 mg | Freq: Once | INTRAMUSCULAR | Status: DC | PRN

## 2019-07-20 MED ORDER — FENTANYL CITRATE (PF) 100 MCG/2ML IJ SOLN
INTRAMUSCULAR | Status: AC
  Filled 2019-07-20: qty 2

## 2019-07-20 MED ORDER — FENTANYL CITRATE (PF) 100 MCG/2ML IJ SOLN
50.0000 ug | INTRAMUSCULAR | Status: DC | PRN
  Administered 2019-07-20: 08:00:00 50 ug via INTRAVENOUS

## 2019-07-20 MED ORDER — PROPOFOL 500 MG/50ML IV EMUL
INTRAVENOUS | Status: DC | PRN
  Administered 2019-07-20: 50 ug/kg/min via INTRAVENOUS

## 2019-07-20 SURGICAL SUPPLY — 51 items
BLADE CLIPPER SURG (BLADE) IMPLANT
BLADE SURG 15 STRL LF DISP TIS (BLADE) ×1 IMPLANT
BLADE SURG 15 STRL SS (BLADE) ×1
CANISTER SUCT 1200ML W/VALVE (MISCELLANEOUS) IMPLANT
CHLORAPREP W/TINT 26 (MISCELLANEOUS) ×2 IMPLANT
CLSR STERI-STRIP ANTIMIC 1/2X4 (GAUZE/BANDAGES/DRESSINGS) ×2 IMPLANT
COVER BACK TABLE REUSABLE LG (DRAPES) ×2 IMPLANT
COVER MAYO STAND REUSABLE (DRAPES) ×2 IMPLANT
COVER WAND RF STERILE (DRAPES) IMPLANT
DECANTER SPIKE VIAL GLASS SM (MISCELLANEOUS) IMPLANT
DERMABOND ADVANCED (GAUZE/BANDAGES/DRESSINGS) ×1
DERMABOND ADVANCED .7 DNX12 (GAUZE/BANDAGES/DRESSINGS) ×1 IMPLANT
DRAPE LAPAROTOMY 100X72 PEDS (DRAPES) ×2 IMPLANT
DRAPE UTILITY XL STRL (DRAPES) ×2 IMPLANT
DRSG TEGADERM 4X4.75 (GAUZE/BANDAGES/DRESSINGS) IMPLANT
ELECT COATED BLADE 2.86 ST (ELECTRODE) IMPLANT
ELECT REM PT RETURN 9FT ADLT (ELECTROSURGICAL) ×2
ELECTRODE REM PT RTRN 9FT ADLT (ELECTROSURGICAL) ×1 IMPLANT
GAUZE PACKING IODOFORM 1/4X15 (GAUZE/BANDAGES/DRESSINGS) IMPLANT
GAUZE SPONGE 4X4 12PLY STRL LF (GAUZE/BANDAGES/DRESSINGS) IMPLANT
GLOVE BIO SURGEON STRL SZ7 (GLOVE) ×2 IMPLANT
GLOVE BIOGEL PI IND STRL 7.0 (GLOVE) ×1 IMPLANT
GLOVE BIOGEL PI IND STRL 7.5 (GLOVE) ×1 IMPLANT
GLOVE BIOGEL PI INDICATOR 7.0 (GLOVE) ×1
GLOVE BIOGEL PI INDICATOR 7.5 (GLOVE) ×1
GLOVE ECLIPSE 6.5 STRL STRAW (GLOVE) ×2 IMPLANT
GLOVE EXAM NITRILE MD LF STRL (GLOVE) ×2 IMPLANT
GOWN STRL REUS W/ TWL LRG LVL3 (GOWN DISPOSABLE) ×2 IMPLANT
GOWN STRL REUS W/TWL LRG LVL3 (GOWN DISPOSABLE) ×2
KIT MARKER MARGIN INK (KITS) IMPLANT
NEEDLE HYPO 25X1 1.5 SAFETY (NEEDLE) ×2 IMPLANT
NS IRRIG 1000ML POUR BTL (IV SOLUTION) IMPLANT
PACK BASIN DAY SURGERY FS (CUSTOM PROCEDURE TRAY) ×2 IMPLANT
PENCIL SMOKE EVACUATOR (MISCELLANEOUS) ×2 IMPLANT
SLEEVE SCD COMPRESS KNEE MED (MISCELLANEOUS) IMPLANT
SPONGE LAP 4X18 RFD (DISPOSABLE) ×2 IMPLANT
SUT ETHILON 2 0 FS 18 (SUTURE) IMPLANT
SUT ETHILON 3 0 PS 1 (SUTURE) ×2 IMPLANT
SUT MNCRL AB 4-0 PS2 18 (SUTURE) ×2 IMPLANT
SUT SILK 2 0 SH (SUTURE) IMPLANT
SUT VIC AB 2-0 SH 27 (SUTURE)
SUT VIC AB 2-0 SH 27XBRD (SUTURE) IMPLANT
SUT VICRYL 3-0 CR8 SH (SUTURE) ×2 IMPLANT
SUT VICRYL 4-0 PS2 18IN ABS (SUTURE) IMPLANT
SWAB COLLECTION DEVICE MRSA (MISCELLANEOUS) IMPLANT
SWAB CULTURE ESWAB REG 1ML (MISCELLANEOUS) IMPLANT
SYR CONTROL 10ML LL (SYRINGE) ×2 IMPLANT
TOWEL GREEN STERILE FF (TOWEL DISPOSABLE) ×2 IMPLANT
TUBE CONNECTING 20X1/4 (TUBING) IMPLANT
UNDERPAD 30X36 HEAVY ABSORB (UNDERPADS AND DIAPERS) IMPLANT
YANKAUER SUCT BULB TIP NO VENT (SUCTIONS) IMPLANT

## 2019-07-20 NOTE — H&P (Signed)
Curtis Dunn is an 78 y.o. male.   Chief Complaint: back mass HPI:  68 yom referred by Dr Wilhemina Bonito for back mass. he has right foot pain now and is on a crutch from what is thought to be gout. this is slowly improving. he is on xarelto for afib. cardiologist is Dr Marlou Porch. he has a 40 year history of a back mass. not really getting bigger but is bothering him now. difficult to lean up against it especially when reading at night. he would like to consider excision. no history of infection   Past Medical History:  Diagnosis Date  . Broken neck ([place])    in March 2015  . Cataract   . Colon cancer ([place])   . Colon polyp   . Diabetes mellitus    Type 1  . Dysrhythmia    a fib  . Gout   . Hyperlipidemia   . Hypertension   . Lipoma of back   . Paroxysmal atrial fibrillation [facility])     Past Surgical History:  Procedure Laterality Date  . ANKLE SURGERY     right  . APPENDECTOMY  2001  . arm surgery     right shoulder surgery  . CATARACT EXTRACTION EXTRACAPSULAR Bilateral   . COLON RESECTION    . ELBOW FRACTURE SURGERY    . INGUINAL HERNIA REPAIR Left 03/13/2016   Procedure: LEFT INGUINAL HERNIA REPAIR WITH UNBILICAL HERNIA REPAIR;  Surgeon: [name], MD;  Location: [place];  Service: General;  Laterality: Left;  . INSERTION OF MESH Left 03/13/2016   Procedure: INSERTION OF MESH;  Surgeon: [name], MD;  Location: [place];  Service: General;  Laterality: Left;  [name] VASECTOMY      Family History  Problem Relation Age of Onset  . Heart disease Father   . Colon cancer Neg Hx   . Esophageal cancer Neg Hx   . Rectal cancer Neg Hx   . Stomach cancer Neg Hx    Social History:  reports that he has never smoked. He has never used smokeless tobacco. He reports current alcohol use. He reports that he does not use drugs.  Allergies: No Known Allergies  Medications Prior to Admission  Medication Sig Dispense Refill  . allopurinol (ZYLOPRIM) 100 MG tablet Take 100 mg by mouth  daily.    [name] amLODipine (NORVASC) 5 MG tablet Take 1 tablet (5 mg total) by mouth daily. 90 tablet 1  . Ascorbic Acid (VITAMIN C) 1000 MG tablet Take 1,000 mg by mouth daily.    . Cholecalciferol (VITAMIN D3) 250 MCG (10000 UT) capsule Take 1 capsule by mouth daily.     . Coenzyme Q-10 200 MG CAPS Take 1 capsule by mouth daily.    . Continuous Blood Gluc Sensor (FREESTYLE LIBRE 14 DAY SENSOR) MISC USE TO MONITOR BLOOD GLUCOSE. CHANGE EVERY 14 DAYS.    [name] Cyanocobalamin 5000 MCG TBDP Take 1 tablet by mouth daily.    . furosemide (LASIX) 40 MG tablet Take 40 mg by mouth daily.     [name] Hawthorne Berry 550 MG CAPS Take 550 mg by mouth daily.  0  . hydrALAZINE (APRESOLINE) 50 MG tablet Take 1 tablet (50 mg total) by mouth 2 (two) times daily. 180 tablet 3  . insulin aspart (NOVOLOG) 100 UNIT/ML injection Inject into the skin 3 (three) times daily before meals. Inject 12 units at breakfast, 10 units at lunch and 10 units at supper    . insulin glargine (LANTUS)  100 UNIT/ML injection Inject 14 units at breakfast and 12 units at bedtime    . lovastatin (MEVACOR) 20 MG tablet Take 20 mg by mouth at bedtime.    . Magnesium ([employer] TRIPLE MAGNESIUM COMPLEX) 400 MG CAPS Take 1 capsule by mouth daily.    . metoprolol succinate (TOPROL-XL) 25 MG 24 hr tablet Take 1 tablet by mouth once daily 90 tablet 3  . Misc Natural Products (BETA-SITOSTEROL PLANT STEROLS PO) Take 1 tablet by mouth 2 (two) times a day. PRODUCT NAME: SUPER BETA PROSTATE (beta sitosterol, lycopene, reishi mushroom)    . Nutritional Supplements (DHEA PO) Take 100 mg by mouth daily.    . Nutritional Supplements (PYCNOGENOL PO) Take 100 mg by mouth daily.    . Omega 3 1200 MG CAPS Take 1 capsule by mouth daily.    [name] OVER THE COUNTER MEDICATION Take 2 capsules by mouth 3 (three) times daily. Cherry Fruit Extract    . Potassium 99 MG TABS Take 1 tablet by mouth daily.     . ramipril (ALTACE) 10 MG capsule Take 1 capsule (10 mg total) by mouth 2 (two)  times daily. 180 capsule 3  . Tamsulosin HCl (FLOMAX) 0.4 MG CAPS Take 0.4 mg by mouth daily.     . vitamin E 1000 UNIT capsule Take 1,000 Units by mouth daily.     Alveda Reasons 15 MG TABS tablet TAKE 1 TABLET BY MOUTH ONCE DAILY WITH SUPPER 30 tablet 5  . Zinc 50 MG CAPS Take 1 tablet by mouth daily.       Results for orders placed or performed during the hospital encounter of 07/20/19 (from the past 48 hour(s))  Glucose, capillary     Status: Abnormal   Collection Time: 07/20/19  8:06 AM  Result Value Ref Range   Glucose-Capillary 161 (H) 70 - 99 mg/dL   No results found.  Review of Systems  All other systems reviewed and are negative.   Blood pressure (!) 172/55, pulse (!) 57, temperature 98.4 F (36.9 C), temperature source Oral, resp. rate 18, height _0  (1.803 m), weight 72.8 kg, SpO2 99 %. Physical Exam  cv rrr Lungs clear Chest and Lung Exam Note: 4x5 cm soft mobile back mass left thoracic paraspinal region  Assessment/Plan LIPOMA OF BACK (D17.1) Story: excision back lipoma discussed observation vs excision. he certainly could leave this as it is benign. would like to consider excision due to symptoms. I think best to do under local mac at day surgery. I think best to let foot issue get better first so he can be up and around postop. we discussed surgery, risks and recovery as well. he is going to call back when foot is better.  Rolm Bookbinder, MD 07/20/2019, 8:16 AM

## 2019-07-20 NOTE — Interval H&P Note (Signed)
History and Physical Interval Note:  07/20/2019 8:18 AM  Curtis Dunn  has presented today for surgery, with the diagnosis of BACK MASS.  The various methods of treatment have been discussed with the patient and family. After consideration of risks, benefits and other options for treatment, the patient has consented to  Procedure(s) with comments: EXCISION OF BACK MASS (N/A) - MAC AND LOCAL as a surgical intervention.  The patient's history has been reviewed, patient examined, no change in status, stable for surgery.  I have reviewed the patient's chart and labs.  Questions were answered to the patient's satisfaction.     Rolm Bookbinder

## 2019-07-20 NOTE — Anesthesia Preprocedure Evaluation (Signed)
Anesthesia Evaluation  Patient identified by MRN, date of birth, ID band Patient awake    Reviewed: Allergy & Precautions, NPO status , Patient's Chart, lab work & pertinent test results  Airway Mallampati: II  TM Distance: >3 FB Neck ROM: Full    Dental no notable dental hx.    Pulmonary neg pulmonary ROS,    Pulmonary exam normal breath sounds clear to auscultation       Cardiovascular hypertension, Pt. on medications Normal cardiovascular exam+ dysrhythmias Atrial Fibrillation  Rhythm:Regular Rate:Normal     Neuro/Psych negative neurological ROS  negative psych ROS   GI/Hepatic negative GI ROS, Neg liver ROS,   Endo/Other  negative endocrine ROSdiabetes, Insulin Dependent  Renal/GU negative Renal ROS  negative genitourinary   Musculoskeletal negative musculoskeletal ROS (+)   Abdominal   Peds negative pediatric ROS (+)  Hematology negative hematology ROS (+)   Anesthesia Other Findings   Reproductive/Obstetrics negative OB ROS                             Anesthesia Physical Anesthesia Plan  ASA: II  Anesthesia Plan: MAC   Post-op Pain Management:    Induction:   PONV Risk Score and Plan: 1 and Ondansetron and Treatment may vary due to age or medical condition  Airway Management Planned: Nasal Cannula and Simple Face Mask  Additional Equipment:   Intra-op Plan:   Post-operative Plan:   Informed Consent: I have reviewed the patients History and Physical, chart, labs and discussed the procedure including the risks, benefits and alternatives for the proposed anesthesia with the patient or authorized representative who has indicated his/her understanding and acceptance.     Dental advisory given  Plan Discussed with:   Anesthesia Plan Comments:         Anesthesia Quick Evaluation

## 2019-07-20 NOTE — Op Note (Signed)
Preoperative diagnosis: Back mass Postoperative diagnosis: Back lipoma Procedure: Excision of 6 x 5 cm subfascial back mass Surgeon: Dr. Serita Grammes Anesthesia: Local with monitored anesthesia care Estimated blood loss: Minimal Complications: None Drains: None Specimens: Back mass to pathology Disposition to recovery in stable condition Sponge and count was correct at completion  Indications: This is a 78 year old male who has a symptomatic back mass.  This appears to be consistent with a lipoma on his exam.  We discussed observation versus excision.  Due to the symptomatic nature of this lesion and its growth he desired excision.  Procedure: After informed consent was obtained the patient was taken to the operating room.  He was placed under monitored anesthesia care.  He was placed in the right lateral decubitus position and appropriately padded.  He was then prepped and draped in the standard sterile surgical fashion.  Surgical timeout was then performed.  I infiltrated a mixture of quarter percent Marcaine and 1% lidocaine with epinephrine overlying the lesion.  I then made an incision.  I carried this through the fascia.  The lipoma was then identified and removed in its entirety.  This was all the way down to the paraspinal muscles.  Hemostasis was then observed.  I then closed the deep layers with 3-0 Vicryl suture.  I closed the skin with 4-0 Monocryl and I placed a mattress suture of 3-0 nylon in the middle to hold this together.  I then placed glue and Steri-Strips.  He tolerated this well and was transferred to recovery room stable.

## 2019-07-20 NOTE — Transfer of Care (Signed)
Immediate Anesthesia Transfer of Care Note  Patient: Curtis Dunn  Procedure(s) Performed: EXCISION OF BACK MASS (N/A Back)  Patient Location: PACU  Anesthesia Type:MAC  Level of Consciousness: awake, alert , oriented and patient cooperative  Airway & Oxygen Therapy: Patient Spontanous Breathing and Patient connected to face mask oxygen  Post-op Assessment: Report given to RN and Post -op Vital signs reviewed and stable  Post vital signs: Reviewed and stable  Last Vitals:  Vitals Value Taken Time  BP    Temp    Pulse 54 07/20/19 0904  Resp    SpO2 100 % 07/20/19 0904  Vitals shown include unvalidated device data.  Last Pain:  Vitals:   07/20/19 0739  TempSrc: Oral  PainSc: 0-No pain         Complications: No apparent anesthesia complications

## 2019-07-20 NOTE — Anesthesia Postprocedure Evaluation (Signed)
Anesthesia Post Note  Patient: Curtis Dunn  Procedure(s) Performed: EXCISION OF BACK MASS (N/A Back)     Patient location during evaluation: PACU Anesthesia Type: MAC Level of consciousness: awake and alert Pain management: pain level controlled Vital Signs Assessment: post-procedure vital signs reviewed and stable Respiratory status: spontaneous breathing, nonlabored ventilation, respiratory function stable and patient connected to nasal cannula oxygen Cardiovascular status: stable and blood pressure returned to baseline Postop Assessment: no apparent nausea or vomiting Anesthetic complications: no    Last Vitals:  Vitals:   07/20/19 0915 07/20/19 0930  BP: 137/62 (!) 136/50  Pulse: (!) 49 64  Resp: 10 18  Temp:  36.8 C  SpO2: 100% 100%    Last Pain:  Vitals:   07/20/19 0930  TempSrc: Oral  PainSc: 0-No pain                 Montez Hageman

## 2019-07-20 NOTE — Anesthesia Procedure Notes (Signed)
Procedure Name: MAC Date/Time: 07/20/2019 8:41 AM Performed by: Signe Colt, CRNA Pre-anesthesia Checklist: Patient identified, Emergency Drugs available, Suction available, Patient being monitored and Timeout performed Patient Re-evaluated:Patient Re-evaluated prior to induction Oxygen Delivery Method: Simple face mask

## 2019-07-20 NOTE — Discharge Instructions (Signed)
[facility]   POST OP INSTRUCTIONS Restart Xarelto on 9/3 Always review your discharge instruction sheet given to you by the facility where your surgery was performed. IF YOU HAVE DISABILITY OR FAMILY LEAVE FORMS, YOU MUST BRING THEM TO THE OFFICE FOR PROCESSING.   DO NOT GIVE THEM TO YOUR DOCTOR.  1. A  prescription for pain medication may be given to you upon discharge.  Take your pain medication as prescribed, if needed.  If narcotic pain medicine is not needed, then you may take acetaminophen (Tylenol), naprosyn (Alleve) or ibuprofen (Advil) as needed. Can use ice as well 2. Take your usually prescribed medications unless otherwise directed. 3. If you need a refill on your pain medication, please contact your pharmacy.  They will contact our office to request authorization. Prescriptions will not be filled after 5 pm or on week-ends. 4. You should follow a light diet the first 24 hours after arrival home, such as soup and crackers, etc.  Be sure to include lots of fluids daily.  Resume your normal diet the day after surgery. 5. Most patients will experience some swelling and bruising around the incision.  Ice packs will help.  Swelling and bruising can take several days to resolve.  6. It is common to experience some constipation if taking pain medication after surgery.  Increasing fluid intake and taking a stool softener (such as Colace) will usually help or prevent this problem from occurring.  A mild laxative (Milk of Magnesia or Miralax) should be taken according to package directions if there are no bowel movements after 48 hours. 7. Unless discharge instructions indicate otherwise, you may remove your bandages 48 hours after surgery, and you may shower at that time.  You may have steri-strips (small skin tapes) in place directly over the incision.  These strips should be left on the skin for 7-10 days and will come off on their own.  If your surgeon used skin glue on the  incision, you may shower in 24 hours.  The glue will flake off over the next 2-3 weeks.  Any sutures or staples will be removed at the office during your follow-up visit. 8. ACTIVITIES:  You may resume regular (light) daily activities beginning the next day--such as daily self-care, walking, climbing stairs--gradually increasing activities as tolerated.  You may have sexual intercourse when it is comfortable.  Refrain from any heavy lifting or straining until approved by your doctor. a. You may drive when you are no longer taking prescription pain medication, you can comfortably wear a seatbelt, and you can safely maneuver your car and apply brakes. b. RETURN TO WORK:  __________________________________________________________ 9. You should see your doctor in the office for a follow-up appointment approximately 2-3 weeks after your surgery.  Make sure that you call for this appointment within a day or two after you arrive home to insure a convenient appointment time. 10. OTHER INSTRUCTIONS:  __________________________________________________________________________________________________________________________________________________________________________________________  WHEN TO CALL YOUR DOCTOR: 1. Fever over 101.0 2. Inability to urinate 3. Nausea and/or vomiting 4. Extreme swelling or bruising 5. Continued bleeding from incision. 6. Increased pain, redness, or drainage from the incision  The clinic staff is available to answer your questions during regular business hours.  Please dont hesitate to call and ask to speak to one of the nurses for clinical concerns.  If you have a medical emergency, go to the nearest emergency room or call 911.  A surgeon from [facility] is always on call  at the hospital   [address] [place], [place], [place]  [address] ?  [address], [place], [place]   [address] [phone] ? [phone] ? FAX [phone] Web site:  [email or web address]   No Tylenol until 2pm!   Post Anesthesia Home Care Instructions  Activity: Get plenty of rest for the remainder of the day. A responsible individual must stay with you for 24 hours following the procedure.  For the next 24 hours, DO NOT: -Drive a car -Paediatric nurse -Drink alcoholic beverages -Take any medication unless instructed by your physician -Make any legal decisions or sign important papers.  Meals: Start with liquid foods such as gelatin or soup. Progress to regular foods as tolerated. Avoid greasy, spicy, heavy foods. If nausea and/or vomiting occur, drink only clear liquids until the nausea and/or vomiting subsides. Call your physician if vomiting continues.  Special Instructions/Symptoms: Your throat may feel dry or sore from the anesthesia or the breathing tube placed in your throat during surgery. If this causes discomfort, gargle with warm salt water. The discomfort should disappear within 24 hours.  If you had a scopolamine patch placed behind your ear for the management of post- operative nausea and/or vomiting:  1. The medication in the patch is effective for 72 hours, after which it should be removed.  Wrap patch in a tissue and discard in the trash. Wash hands thoroughly with soap and water. 2. You may remove the patch earlier than 72 hours if you experience unpleasant side effects which may include dry mouth, dizziness or visual disturbances. 3. Avoid touching the patch. Wash your hands with soap and water after contact with the patch.

## 2019-07-21 ENCOUNTER — Encounter (HOSPITAL_BASED_OUTPATIENT_CLINIC_OR_DEPARTMENT_OTHER): Payer: Self-pay | Admitting: General Surgery

## 2019-07-27 ENCOUNTER — Other Ambulatory Visit: Payer: Self-pay | Admitting: Nurse Practitioner

## 2019-08-23 ENCOUNTER — Ambulatory Visit: Payer: Self-pay | Admitting: Pharmacist

## 2019-09-05 ENCOUNTER — Ambulatory Visit: Payer: Self-pay | Admitting: *Deleted

## 2019-09-06 ENCOUNTER — Other Ambulatory Visit: Payer: Self-pay | Admitting: *Deleted

## 2019-09-06 ENCOUNTER — Ambulatory Visit: Payer: Self-pay | Admitting: *Deleted

## 2019-09-06 NOTE — Patient Outreach (Signed)
  [place] [facility]) Care Management Chronic Special Needs Program    09/06/2019  Name: Curtis Dunn, DOB: 12-Aug-1941  MRN: [id number]   [name] is enrolled in a chronic special needs plan for Diabetes. Attempted to reach Curtis Dunn via home number to complete follow up telephone assessment; no answer; left HIPAA compliant message requesting return call.  Plan: If client does not return call, will make second outreach call within one week.  Kelli Churn RN, CCM, [facility] Care Management [phone]

## 2019-09-07 ENCOUNTER — Ambulatory Visit: Payer: Self-pay | Admitting: *Deleted

## 2019-09-08 ENCOUNTER — Ambulatory Visit: Payer: Self-pay | Admitting: *Deleted

## 2019-09-09 ENCOUNTER — Ambulatory Visit: Payer: Self-pay | Admitting: *Deleted

## 2019-09-09 ENCOUNTER — Encounter: Payer: Self-pay | Admitting: Gastroenterology

## 2019-09-12 ENCOUNTER — Other Ambulatory Visit: Payer: Self-pay | Admitting: *Deleted

## 2019-09-12 ENCOUNTER — Encounter: Payer: Self-pay | Admitting: *Deleted

## 2019-09-12 NOTE — Patient Outreach (Signed)
Curtis Dunn, Curtis Dunn, Curtis Dunn) Care Management Chronic Special Needs Program  09/12/2019  Name: Curtis Dunn DOB: 1941/06/05  MRN: [id number]  [name] is enrolled in a chronic special needs plan for Diabetes. Reviewed and updated care plan.  Subjective: [name] states he is doing well, readings from [facility] that he scans 7 times daily indicates >75% of his readings are meeting target. He says he has had no known episides of atrial fibrillation and his most recent appointment with his cardioloigst in July went well. He reports his home monitored blood pressure readings are meeting target. He says he did well with the outpatient surgery on 07/20/19 for removal of a 2.05 in x 1.78 inch x 0.83 inch back lipoma that he had for 35 years..  He says his gout exacerbation has resolved but he is concerned about lower extremity edema that started after the gout resolved. He says he is up to date with his eye, foot and dental exams. His new dentist, Dr. Benna Dunks, states he needs treatment for dental abfraction (V- shaped notches that appear at the gingival margin)  on 8 of his teeth and will have the first procedure done 10/04/19.   Goals Addressed            This Visit's Progress     Patient Stated   . 'I'm concerned about the new swelling in my feet since my gout resolved" (pt-stated)       Discussed strategies to reduce lower extremity edema such as elevating feet above heart level, performing ankle pumps to promote venous return and adherence with furosemide therapy Client states he will discuss with his provider during his November appointment    . Ask Dr Forde Dandy about the [facility] 2 with real time glucose alarms at November office visit. (pt-stated)       Client states he is happy with his current [employer] and doesn't think he would want one with alarms      Other   .  Acknowledge receipt of Advanced Directive package       Client has the  information but has not completed them yet    . Advanced Care Planning complete by January 2021       Client has the information but has not completed them yet    . Client understands the importance of follow-up with providers by attending scheduled visits       Client consistently completes provider appointments    . Client will report no worsening of symptoms of Atrial Fibrillation within the next 4 months   On track    Client reports no symptoms of atrial fibrillation He is adherent with Xarelto- discussed purpose and mechanism of action, reviewed importance of continued adherence    . Client will verbalize knowledge of self management of Hypertension as evidences by BP reading of 140/90 or less; or as defined by provider   On track    Home monitored and provider visit BP readings are meeting targets consistently Client is adherent with blood pressure medicines    . HEMOGLOBIN A1C < 7.0       Hgb A1C is consistently below 7    . Maintain timely refills of diabetic medication as prescribed within the year .   On track    Dispense history indicates client maintains timely refills    . Obtain annual  Lipid Profile, LDL-C   On track    Lipid profile was drawn on 05/16/19 - all elements  met target    . Obtain Annual Eye (retinal)  Exam    On track    Client had retinal eye exam on 07/14/19 - client states not evidence of diabetic retinopathy    . Obtain Annual Foot Exam   On track    Diabetic foot exam was completed on 01/20/19    . Obtain annual screen for micro albuminuria (urine) , nephropathy (kidney problems)   Not on track    Most recent urine for microalbumin was checked 04/19/18    . Obtain Hemoglobin A1C at least 2 times per year   On track    Most recent Hgb A1C was checked 05/16/19 , it is checked every 4 months    . Visit Primary Care Provider or Endocrinologist at least 2 times per year    On track    Last visit with Dr. Forde Dandy was 06/02/19, client says his next appointment is  10/10/19     Assessment: Client is meeting diabetes self-management goal of hemoglobin A1C of <7% with most recent reading of 5.8% on 05/16/19 without reports of frequent hypoglycemia . Client has good understanding of:  COVID-19 cause, symptoms, precautions (social distancing, stay at home order, hand washing, wear face covering when unable to maintain or ensure 6 foot social distancing), and symptoms requiring provider notification.  Plan:  Send successful outreach letter with a copy of their individualized care plan and send individual care plan to provider Chronic care management coordinator will outreach in:  6 Months.  Kelli Churn RN, CCM, CDE Chronic Care Management Coordinator [facility] Care Management [phone]        .

## 2019-09-13 ENCOUNTER — Ambulatory Visit: Payer: Self-pay | Admitting: *Deleted

## 2019-09-28 ENCOUNTER — Other Ambulatory Visit: Payer: Self-pay | Admitting: Pharmacist

## 2019-09-28 ENCOUNTER — Ambulatory Visit: Payer: Self-pay | Admitting: Pharmacist

## 2019-09-28 NOTE — Patient Outreach (Addendum)
[name] Care Management  09/28/2019  Curtis Dunn [birth year] [id number]   Patient was called regarding CSNP follow up. Unfortunately, he did not answer her his phone.  HIPAA compliant message was left on his voicemail.  Patient is a 78 year old male with multiple medical conditions including but not limited to: type 1 diabetes, hypertension,  A fib, and history of falls.  Patient was recently hospitalized for back lipoma removal.    HgA1c-5.8% 05/16/19 On statin Lovastatin-last filled 08/28/2019 #90  Plan: Send unsuccessful outreach letter Call patient back in 4-6 months.  [name], PharmD, [place] Clinical Pharmacist [phone]

## 2019-10-03 DIAGNOSIS — I8311 Varicose veins of right lower extremity with inflammation: Secondary | ICD-10-CM | POA: Diagnosis not present

## 2019-10-03 DIAGNOSIS — L821 Other seborrheic keratosis: Secondary | ICD-10-CM | POA: Diagnosis not present

## 2019-10-03 DIAGNOSIS — D2272 Melanocytic nevi of left lower limb, including hip: Secondary | ICD-10-CM | POA: Diagnosis not present

## 2019-10-03 DIAGNOSIS — D225 Melanocytic nevi of trunk: Secondary | ICD-10-CM | POA: Diagnosis not present

## 2019-10-03 DIAGNOSIS — L570 Actinic keratosis: Secondary | ICD-10-CM | POA: Diagnosis not present

## 2019-10-03 DIAGNOSIS — Z85828 Personal history of other malignant neoplasm of skin: Secondary | ICD-10-CM | POA: Diagnosis not present

## 2019-10-03 DIAGNOSIS — I872 Venous insufficiency (chronic) (peripheral): Secondary | ICD-10-CM | POA: Diagnosis not present

## 2019-10-03 DIAGNOSIS — I8312 Varicose veins of left lower extremity with inflammation: Secondary | ICD-10-CM | POA: Diagnosis not present

## 2019-10-03 DIAGNOSIS — D2271 Melanocytic nevi of right lower limb, including hip: Secondary | ICD-10-CM | POA: Diagnosis not present

## 2019-10-03 DIAGNOSIS — D1801 Hemangioma of skin and subcutaneous tissue: Secondary | ICD-10-CM | POA: Diagnosis not present

## 2019-10-04 DIAGNOSIS — E039 Hypothyroidism, unspecified: Secondary | ICD-10-CM | POA: Diagnosis not present

## 2019-10-04 DIAGNOSIS — Z7901 Long term (current) use of anticoagulants: Secondary | ICD-10-CM | POA: Diagnosis not present

## 2019-10-04 DIAGNOSIS — N401 Enlarged prostate with lower urinary tract symptoms: Secondary | ICD-10-CM | POA: Diagnosis not present

## 2019-10-04 DIAGNOSIS — I5189 Other ill-defined heart diseases: Secondary | ICD-10-CM | POA: Diagnosis not present

## 2019-10-04 DIAGNOSIS — E785 Hyperlipidemia, unspecified: Secondary | ICD-10-CM | POA: Diagnosis not present

## 2019-10-04 DIAGNOSIS — H9201 Otalgia, right ear: Secondary | ICD-10-CM | POA: Diagnosis not present

## 2019-10-04 DIAGNOSIS — E1029 Type 1 diabetes mellitus with other diabetic kidney complication: Secondary | ICD-10-CM | POA: Diagnosis not present

## 2019-10-04 DIAGNOSIS — N1831 Chronic kidney disease, stage 3a: Secondary | ICD-10-CM | POA: Diagnosis not present

## 2019-10-04 DIAGNOSIS — M109 Gout, unspecified: Secondary | ICD-10-CM | POA: Diagnosis not present

## 2019-10-04 DIAGNOSIS — I480 Paroxysmal atrial fibrillation: Secondary | ICD-10-CM | POA: Diagnosis not present

## 2019-10-04 DIAGNOSIS — C180 Malignant neoplasm of cecum: Secondary | ICD-10-CM | POA: Diagnosis not present

## 2019-10-04 DIAGNOSIS — I10 Essential (primary) hypertension: Secondary | ICD-10-CM | POA: Diagnosis not present

## 2019-10-10 DIAGNOSIS — H903 Sensorineural hearing loss, bilateral: Secondary | ICD-10-CM | POA: Diagnosis not present

## 2019-10-10 DIAGNOSIS — H6121 Impacted cerumen, right ear: Secondary | ICD-10-CM | POA: Diagnosis not present

## 2019-10-10 DIAGNOSIS — H6241 Otitis externa in other diseases classified elsewhere, right ear: Secondary | ICD-10-CM | POA: Diagnosis not present

## 2019-10-10 DIAGNOSIS — B369 Superficial mycosis, unspecified: Secondary | ICD-10-CM | POA: Diagnosis not present

## 2019-10-10 DIAGNOSIS — H6123 Impacted cerumen, bilateral: Secondary | ICD-10-CM | POA: Insufficient documentation

## 2019-10-20 DIAGNOSIS — H02834 Dermatochalasis of left upper eyelid: Secondary | ICD-10-CM | POA: Diagnosis not present

## 2019-10-20 DIAGNOSIS — H02831 Dermatochalasis of right upper eyelid: Secondary | ICD-10-CM | POA: Diagnosis not present

## 2019-10-20 DIAGNOSIS — H40013 Open angle with borderline findings, low risk, bilateral: Secondary | ICD-10-CM | POA: Diagnosis not present

## 2019-10-20 DIAGNOSIS — E103293 Type 1 diabetes mellitus with mild nonproliferative diabetic retinopathy without macular edema, bilateral: Secondary | ICD-10-CM | POA: Diagnosis not present

## 2019-10-25 DIAGNOSIS — R3912 Poor urinary stream: Secondary | ICD-10-CM | POA: Diagnosis not present

## 2019-11-14 ENCOUNTER — Emergency Department (HOSPITAL_COMMUNITY): Payer: HMO

## 2019-11-14 ENCOUNTER — Observation Stay (HOSPITAL_COMMUNITY): Payer: HMO

## 2019-11-14 ENCOUNTER — Inpatient Hospital Stay (HOSPITAL_COMMUNITY)
Admission: EM | Admit: 2019-11-14 | Discharge: 2019-11-16 | DRG: 065 | Disposition: A | Discharge status: Home / Self Care | Payer: HMO | Attending: Internal Medicine | Admitting: Internal Medicine

## 2019-11-14 ENCOUNTER — Encounter (HOSPITAL_COMMUNITY): Payer: Self-pay

## 2019-11-14 ENCOUNTER — Other Ambulatory Visit: Payer: Self-pay

## 2019-11-14 DIAGNOSIS — R200 Anesthesia of skin: Secondary | ICD-10-CM | POA: Diagnosis not present

## 2019-11-14 DIAGNOSIS — I6389 Other cerebral infarction: Secondary | ICD-10-CM | POA: Diagnosis not present

## 2019-11-14 DIAGNOSIS — I480 Paroxysmal atrial fibrillation: Secondary | ICD-10-CM

## 2019-11-14 DIAGNOSIS — G8324 Monoplegia of upper limb affecting left nondominant side: Secondary | ICD-10-CM | POA: Diagnosis present

## 2019-11-14 DIAGNOSIS — N183 Chronic kidney disease, stage 3 unspecified: Secondary | ICD-10-CM | POA: Diagnosis present

## 2019-11-14 DIAGNOSIS — I10 Essential (primary) hypertension: Secondary | ICD-10-CM | POA: Diagnosis not present

## 2019-11-14 DIAGNOSIS — E785 Hyperlipidemia, unspecified: Secondary | ICD-10-CM | POA: Diagnosis present

## 2019-11-14 DIAGNOSIS — D6832 Hemorrhagic disorder due to extrinsic circulating anticoagulants: Secondary | ICD-10-CM | POA: Diagnosis not present

## 2019-11-14 DIAGNOSIS — I4510 Unspecified right bundle-branch block: Secondary | ICD-10-CM | POA: Diagnosis present

## 2019-11-14 DIAGNOSIS — I6529 Occlusion and stenosis of unspecified carotid artery: Secondary | ICD-10-CM | POA: Diagnosis present

## 2019-11-14 DIAGNOSIS — Z8249 Family history of ischemic heart disease and other diseases of the circulatory system: Secondary | ICD-10-CM | POA: Diagnosis not present

## 2019-11-14 DIAGNOSIS — R202 Paresthesia of skin: Secondary | ICD-10-CM | POA: Diagnosis not present

## 2019-11-14 DIAGNOSIS — E10649 Type 1 diabetes mellitus with hypoglycemia without coma: Secondary | ICD-10-CM | POA: Diagnosis not present

## 2019-11-14 DIAGNOSIS — Z20828 Contact with and (suspected) exposure to other viral communicable diseases: Secondary | ICD-10-CM | POA: Diagnosis present

## 2019-11-14 DIAGNOSIS — M109 Gout, unspecified: Secondary | ICD-10-CM | POA: Diagnosis present

## 2019-11-14 DIAGNOSIS — E1059 Type 1 diabetes mellitus with other circulatory complications: Secondary | ICD-10-CM | POA: Diagnosis not present

## 2019-11-14 DIAGNOSIS — E1022 Type 1 diabetes mellitus with diabetic chronic kidney disease: Secondary | ICD-10-CM | POA: Diagnosis present

## 2019-11-14 DIAGNOSIS — Z794 Long term (current) use of insulin: Secondary | ICD-10-CM

## 2019-11-14 DIAGNOSIS — I63511 Cerebral infarction due to unspecified occlusion or stenosis of right middle cerebral artery: Secondary | ICD-10-CM | POA: Diagnosis not present

## 2019-11-14 DIAGNOSIS — E1051 Type 1 diabetes mellitus with diabetic peripheral angiopathy without gangrene: Secondary | ICD-10-CM | POA: Diagnosis present

## 2019-11-14 DIAGNOSIS — I6340 Cerebral infarction due to embolism of unspecified cerebral artery: Secondary | ICD-10-CM | POA: Diagnosis not present

## 2019-11-14 DIAGNOSIS — Z7901 Long term (current) use of anticoagulants: Secondary | ICD-10-CM

## 2019-11-14 DIAGNOSIS — R29701 NIHSS score 1: Secondary | ICD-10-CM | POA: Diagnosis present

## 2019-11-14 DIAGNOSIS — I63233 Cerebral infarction due to unspecified occlusion or stenosis of bilateral carotid arteries: Secondary | ICD-10-CM | POA: Diagnosis not present

## 2019-11-14 DIAGNOSIS — I639 Cerebral infarction, unspecified: Secondary | ICD-10-CM

## 2019-11-14 DIAGNOSIS — E109 Type 1 diabetes mellitus without complications: Secondary | ICD-10-CM | POA: Diagnosis present

## 2019-11-14 DIAGNOSIS — R233 Spontaneous ecchymoses: Secondary | ICD-10-CM | POA: Diagnosis not present

## 2019-11-14 DIAGNOSIS — T45515A Adverse effect of anticoagulants, initial encounter: Secondary | ICD-10-CM | POA: Diagnosis not present

## 2019-11-14 DIAGNOSIS — Z03818 Encounter for observation for suspected exposure to other biological agents ruled out: Secondary | ICD-10-CM | POA: Diagnosis not present

## 2019-11-14 DIAGNOSIS — I129 Hypertensive chronic kidney disease with stage 1 through stage 4 chronic kidney disease, or unspecified chronic kidney disease: Secondary | ICD-10-CM | POA: Diagnosis present

## 2019-11-14 DIAGNOSIS — Z85038 Personal history of other malignant neoplasm of large intestine: Secondary | ICD-10-CM | POA: Diagnosis not present

## 2019-11-14 DIAGNOSIS — I63411 Cerebral infarction due to embolism of right middle cerebral artery: Principal | ICD-10-CM | POA: Diagnosis present

## 2019-11-14 DIAGNOSIS — I63532 Cerebral infarction due to unspecified occlusion or stenosis of left posterior cerebral artery: Secondary | ICD-10-CM | POA: Diagnosis not present

## 2019-11-14 DIAGNOSIS — I4891 Unspecified atrial fibrillation: Secondary | ICD-10-CM | POA: Diagnosis present

## 2019-11-14 LAB — COMPREHENSIVE METABOLIC PANEL
ALT: 20 U/L (ref 0–44)
AST: 30 U/L (ref 15–41)
Albumin: 4.1 g/dL (ref 3.5–5.0)
Alkaline Phosphatase: 62 U/L (ref 38–126)
Anion gap: 12 (ref 5–15)
BUN: 26 mg/dL — ABNORMAL HIGH (ref 8–23)
CO2: 28 mmol/L (ref 22–32)
Calcium: 9.8 mg/dL (ref 8.9–10.3)
Chloride: 100 mmol/L (ref 98–111)
Creatinine, Ser: 1.36 mg/dL — ABNORMAL HIGH (ref 0.61–1.24)
GFR calc Af Amer: 57 mL/min — ABNORMAL LOW (ref >60)
GFR calc non Af Amer: 49 mL/min — ABNORMAL LOW (ref >60)
Glucose, Bld: 180 mg/dL — ABNORMAL HIGH (ref 70–99)
Potassium: 4.2 mmol/L (ref 3.5–5.1)
Sodium: 140 mmol/L (ref 135–145)
Total Bilirubin: 0.8 mg/dL (ref 0.3–1.2)
Total Protein: 6.5 g/dL (ref 6.5–8.1)

## 2019-11-14 LAB — DIFFERENTIAL
Abs Immature Granulocytes: 0.01 10*3/uL (ref 0.00–0.07)
Basophils Absolute: 0.1 10*3/uL (ref 0.0–0.1)
Basophils Relative: 1 %
Eosinophils Absolute: 0.2 10*3/uL (ref 0.0–0.5)
Eosinophils Relative: 3 %
Immature Granulocytes: 0 %
Lymphocytes Relative: 14 %
Lymphs Abs: 1 10*3/uL (ref 0.7–4.0)
Monocytes Absolute: 0.6 10*3/uL (ref 0.1–1.0)
Monocytes Relative: 9 %
Neutro Abs: 5.2 10*3/uL (ref 1.7–7.7)
Neutrophils Relative %: 73 %

## 2019-11-14 LAB — CBC
HCT: 39.4 % (ref 39.0–52.0)
Hemoglobin: 13 g/dL (ref 13.0–17.0)
MCH: 32 pg (ref 26.0–34.0)
MCHC: 33 g/dL (ref 30.0–36.0)
MCV: 97 fL (ref 80.0–100.0)
Platelets: 188 10*3/uL (ref 150–400)
RBC: 4.06 MIL/uL — ABNORMAL LOW (ref 4.22–5.81)
RDW: 13 % (ref 11.5–15.5)
WBC: 7.1 10*3/uL (ref 4.0–10.5)
nRBC: 0 % (ref 0.0–0.2)

## 2019-11-14 LAB — PROTIME-INR
INR: 1 (ref 0.8–1.2)
Prothrombin Time: 13.3 seconds (ref 11.4–15.2)

## 2019-11-14 LAB — ETHANOL: Alcohol, Ethyl (B): <10 mg/dL (ref <10)

## 2019-11-14 LAB — CBG MONITORING, ED: Glucose-Capillary: 150 mg/dL — ABNORMAL HIGH (ref 70–99)

## 2019-11-14 LAB — APTT: aPTT: 27 seconds (ref 24–36)

## 2019-11-14 MED ORDER — INSULIN GLARGINE 100 UNIT/ML ~~LOC~~ SOLN
12.0000 [IU] | Freq: Every day | SUBCUTANEOUS | Status: DC
  Filled 2019-11-14: qty 0.12

## 2019-11-14 MED ORDER — PRAVASTATIN SODIUM 10 MG PO TABS
20.0000 mg | ORAL_TABLET | Freq: Every day | ORAL | Status: DC
  Administered 2019-11-15: 17:00:00 20 mg via ORAL
  Filled 2019-11-14: qty 2

## 2019-11-14 MED ORDER — RAMIPRIL 10 MG PO CAPS
10.0000 mg | ORAL_CAPSULE | Freq: Two times a day (BID) | ORAL | Status: DC
  Filled 2019-11-14: qty 1

## 2019-11-14 MED ORDER — INSULIN GLARGINE 100 UNIT/ML ~~LOC~~ SOLN
14.0000 [IU] | Freq: Every day | SUBCUTANEOUS | Status: DC
  Administered 2019-11-14: 14 [IU] via SUBCUTANEOUS
  Filled 2019-11-14: qty 0.14

## 2019-11-14 MED ORDER — ACETAMINOPHEN 650 MG RE SUPP: 650.0000 mg | RECTAL | Status: DC | PRN

## 2019-11-14 MED ORDER — IOHEXOL 350 MG/ML SOLN
75.0000 mL | Freq: Once | INTRAVENOUS | Status: AC | PRN
  Administered 2019-11-15: 75 mL via INTRAVENOUS

## 2019-11-14 MED ORDER — HYDRALAZINE HCL 25 MG PO TABS: 50.0000 mg | ORAL_TABLET | Freq: Two times a day (BID) | ORAL | Status: DC

## 2019-11-14 MED ORDER — MAGNESIUM OXIDE 400 (241.3 MG) MG PO TABS
400.0000 mg | ORAL_TABLET | Freq: Every day | ORAL | Status: DC
  Administered 2019-11-15 – 2019-11-16 (×2): 400 mg via ORAL
  Filled 2019-11-14 (×2): qty 1

## 2019-11-14 MED ORDER — ACETAMINOPHEN 160 MG/5ML PO SOLN: 650.0000 mg | ORAL | Status: DC | PRN

## 2019-11-14 MED ORDER — STROKE: EARLY STAGES OF RECOVERY BOOK
Freq: Once | Status: DC
  Filled 2019-11-14: qty 1

## 2019-11-14 MED ORDER — AMLODIPINE BESYLATE 5 MG PO TABS: 5.0000 mg | ORAL_TABLET | Freq: Every day | ORAL | Status: DC

## 2019-11-14 MED ORDER — ALFUZOSIN HCL ER 10 MG PO TB24
10.0000 mg | ORAL_TABLET | Freq: Every day | ORAL | Status: DC
  Administered 2019-11-15 – 2019-11-16 (×2): 10 mg via ORAL
  Filled 2019-11-14 (×3): qty 1

## 2019-11-14 MED ORDER — POTASSIUM 99 MG PO TABS: 1.0000 | ORAL_TABLET | Freq: Every day | ORAL | Status: DC

## 2019-11-14 MED ORDER — INSULIN ASPART 100 UNIT/ML ~~LOC~~ SOLN: 10.0000 [IU] | Freq: Three times a day (TID) | SUBCUTANEOUS | Status: DC

## 2019-11-14 MED ORDER — OMEGA-3-ACID ETHYL ESTERS 1 G PO CAPS
1.0000 g | ORAL_CAPSULE | Freq: Every day | ORAL | Status: DC
  Administered 2019-11-15 – 2019-11-16 (×2): 1 g via ORAL
  Filled 2019-11-14 (×2): qty 1

## 2019-11-14 MED ORDER — ALLOPURINOL 100 MG PO TABS
100.0000 mg | ORAL_TABLET | Freq: Every day | ORAL | Status: DC
  Administered 2019-11-15 – 2019-11-16 (×2): 100 mg via ORAL
  Filled 2019-11-14 (×2): qty 1

## 2019-11-14 MED ORDER — ACETAMINOPHEN 325 MG PO TABS: 650.0000 mg | ORAL_TABLET | ORAL | Status: DC | PRN

## 2019-11-14 MED ORDER — ZINC 50 MG PO CAPS: 50.0000 mg | ORAL_CAPSULE | Freq: Every day | ORAL | Status: DC

## 2019-11-14 MED ORDER — ASCORBIC ACID 500 MG PO TABS
1000.0000 mg | ORAL_TABLET | Freq: Every day | ORAL | Status: DC
  Administered 2019-11-15 – 2019-11-16 (×2): 1000 mg via ORAL
  Filled 2019-11-14 (×2): qty 2

## 2019-11-14 MED ORDER — OMEGA 3 1200 MG PO CAPS: 1.0000 | ORAL_CAPSULE | Freq: Every day | ORAL | Status: DC

## 2019-11-14 MED ORDER — ZINC SULFATE 220 (50 ZN) MG PO CAPS
220.0000 mg | ORAL_CAPSULE | Freq: Every day | ORAL | Status: DC
  Administered 2019-11-15 – 2019-11-16 (×2): 220 mg via ORAL
  Filled 2019-11-14 (×2): qty 1

## 2019-11-14 MED ORDER — RIVAROXABAN 15 MG PO TABS
15.0000 mg | ORAL_TABLET | Freq: Every day | ORAL | Status: DC
  Administered 2019-11-14: 15 mg via ORAL
  Filled 2019-11-14: qty 1

## 2019-11-14 MED ORDER — VITAMIN B-12 1000 MCG PO TABS
5000.0000 ug | ORAL_TABLET | Freq: Every day | ORAL | Status: DC
  Administered 2019-11-15 – 2019-11-16 (×2): 5000 ug via ORAL
  Filled 2019-11-14 (×2): qty 5

## 2019-11-14 MED ORDER — METOPROLOL SUCCINATE ER 25 MG PO TB24
25.0000 mg | ORAL_TABLET | Freq: Every day | ORAL | Status: DC
  Administered 2019-11-16: 25 mg via ORAL
  Filled 2019-11-14 (×2): qty 1

## 2019-11-14 MED ORDER — MAGNESIUM 400 MG PO CAPS: 1.0000 | ORAL_CAPSULE | Freq: Every day | ORAL | Status: DC

## 2019-11-14 NOTE — ED Provider Notes (Addendum)
[facility] EMERGENCY DEPARTMENT Provider Note   CSN: [id number] Arrival date & time: 11/14/19  1405     History Chief Complaint  Patient presents with  . Numbness    Curtis Dunn is a 78 y.o. male.  Patient with history of paroxysmal atrial fibrillation anticoagulated on Xarelto, insulin-dependent diabetes, hypertension --presents to the emergency department today with complaint of poor coordination and numbness in his left hand.  Symptoms began acutely today at around 10:30 AM.  Patient noted that he could not feel in the fingers of his left hand when he touched his forehead.  He also had difficulty with fine motor movements and was dropping objects and had difficulty replacing batteries in his hearing aid.  He was urged to come to the emergency department by his daughter and a friend.  Symptoms have been improving but have not resolved.  Seen by Dr. Jeanell Sparrow at [phone] and no code stroke was called.  Patient denied any vision changes or vision loss, facial droop or speech difficulties.  He was not weak in his legs but state he had to concentrate harder to walk.  No chest pain or shortness of breath.  Temperature sensation reportedly intact.  The onset of this condition was acute. The course is constant. Aggravating factors: none. Alleviating factors: none.          Past Medical History:  Diagnosis Date  . Broken neck ([place])    in March 2015  . Cataract   . Colon cancer ([place])   . Colon polyp   . Diabetes mellitus    Type 1  . Dysrhythmia    a fib  . Gout   . Hyperlipidemia   . Hypertension   . Lipoma of back   . Paroxysmal atrial fibrillation [facility])     Patient Active Problem List   Diagnosis Date Noted  . Pain in joint of right ankle 05/12/2019  . Cellulitis and abscess of foot, except toes 05/03/2019  . Paronychia of great toe of right foot 05/03/2019  . Multiple fractures of ribs of both sides 12/29/2015  . Flail chest 12/25/2015  . AKI (acute  kidney injury) ([place]) 12/25/2015  . Hemopneumothorax on left 12/25/2015  . IDDM (insulin dependent diabetes mellitus) 12/25/2015  . History of colon cancer 08/21/2014  . Acute blood loss anemia 02/11/2014  . Fracture dislocation of cervical spine ([place]) 02/10/2014  . C1 cervical fracture ([place]) 02/10/2014  . Fall 02/10/2014  . Distal radius fracture, right 02/10/2014  . Concussion 02/10/2014  . Facial laceration 02/10/2014  . Anticoagulated 02/10/2014  . PAF (paroxysmal atrial fibrillation) ([place]) 09/08/2012  . Palpitations 03/05/2012  . MALIGNANT NEOPLASM OF CECUM 12/11/2007  . COLONIC POLYPS 12/11/2007  . DIABETES MELLITUS, TYPE I 12/11/2007  . Essential hypertension 12/11/2007    Past Surgical History:  Procedure Laterality Date  . ANKLE SURGERY     right  . APPENDECTOMY  2001  . arm surgery     right shoulder surgery  . CATARACT EXTRACTION EXTRACAPSULAR Bilateral   . COLON RESECTION    . ELBOW FRACTURE SURGERY    . INGUINAL HERNIA REPAIR Left 03/13/2016   Procedure: LEFT INGUINAL HERNIA REPAIR WITH UNBILICAL HERNIA REPAIR;  Surgeon: [name], MD;  Location: [facility];  Service: General;  Laterality: Left;  . INSERTION OF MESH Left 03/13/2016   Procedure: INSERTION OF MESH;  Surgeon: [name], MD;  Location: [facility];  Service: General;  Laterality: Left;  [name] MASS EXCISION N/A 07/20/2019  Procedure: EXCISION OF BACK MASS;  Surgeon: Rolm Bookbinder, MD;  Location: [facility];  Service: General;  Laterality: N/A;  MAC AND LOCAL  . VASECTOMY         Family History  Problem Relation Age of Onset  . Heart disease Father   . Colon cancer Neg Hx   . Esophageal cancer Neg Hx   . Rectal cancer Neg Hx   . Stomach cancer Neg Hx     Social History   Tobacco Use  . Smoking status: Never Smoker  . Smokeless tobacco: Never Used  . Tobacco comment: occasional cigar  Substance Use Topics  . Alcohol use: Yes    Comment: rare  . Drug use: No    Home  Medications Prior to Admission medications   Medication Sig Start Date End Date Taking? Authorizing Provider  allopurinol (ZYLOPRIM) 100 MG tablet Take 100 mg by mouth daily. 06/02/19   [provider]  amLODipine (NORVASC) 5 MG tablet Take 1 tablet by mouth once daily 07/27/19   Burtis Junes, NP  Ascorbic Acid (VITAMIN C) 1000 MG tablet Take 1,000 mg by mouth daily.    [provider]  Cholecalciferol (VITAMIN D3) 250 MCG (10000 UT) capsule Take 1 capsule by mouth daily.     [provider]  Coenzyme Q-10 200 MG CAPS Take 1 capsule by mouth daily.    [provider]  Continuous Blood Gluc Sensor (FREESTYLE LIBRE 14 DAY SENSOR) MISC USE TO MONITOR BLOOD GLUCOSE. CHANGE EVERY 14 DAYS. 02/14/19   [provider]  Cyanocobalamin 5000 MCG TBDP Take 1 tablet by mouth daily.    [provider]  furosemide (LASIX) 40 MG tablet Take 20 mg by mouth 2 (two) times daily.     Anda Kraft, MD  Cathrine Muster 550 MG CAPS Take 550 mg by mouth daily. 03/19/17   Jerline Pain, MD  hydrALAZINE (APRESOLINE) 50 MG tablet Take 1 tablet (50 mg total) by mouth 2 (two) times daily. 01/04/19 07/13/19  Burtis Junes, NP  insulin aspart (NOVOLOG) 100 UNIT/ML injection Inject into the skin 3 (three) times daily before meals. Inject 12 units at breakfast, 10 units at lunch and 10 units at supper    [provider]  insulin glargine (LANTUS) 100 UNIT/ML injection Inject 14 units at breakfast and 12 units at bedtime    [provider]  lovastatin (MEVACOR) 20 MG tablet Take 20 mg by mouth at bedtime.    [provider]  Magnesium ([employer] TRIPLE MAGNESIUM COMPLEX) 400 MG CAPS Take 1 capsule by mouth daily.    [provider]  metoprolol succinate (TOPROL-XL) 25 MG 24 hr tablet Take 1 tablet by mouth once daily 06/30/19   Jerline Pain, MD  Misc Natural Products (BETA-SITOSTEROL PLANT STEROLS PO) Take 1 tablet by mouth 2 (two) times a day.  PRODUCT NAME: SUPER BETA PROSTATE (beta sitosterol, lycopene, reishi mushroom)    [provider]  Nutritional Supplements (DHEA PO) Take 100 mg by mouth daily.    [provider]  Nutritional Supplements (PYCNOGENOL PO) Take 100 mg by mouth daily.    [provider]  Omega 3 1200 MG CAPS Take 1 capsule by mouth daily.    [provider]  Potassium 99 MG TABS Take 1 tablet by mouth daily.     [provider]  ramipril (ALTACE) 10 MG capsule Take 1 capsule (10 mg total) by mouth 2 (two) times daily. 11/01/18  Burtis Junes, NP  Tamsulosin HCl (FLOMAX) 0.4 MG CAPS Take 0.4 mg by mouth daily.     [provider]  vitamin E 1000 UNIT capsule Take 1,000 Units by mouth daily.     [provider]  XARELTO 15 MG TABS tablet TAKE 1 TABLET BY MOUTH ONCE DAILY WITH SUPPER 05/31/19   Burtis Junes, NP  Zinc 50 MG CAPS Take 1 tablet by mouth daily.     [provider]    Allergies    Patient has no known allergies.  Review of Systems   Review of Systems  Constitutional: Negative for fever.  HENT: Negative for rhinorrhea and sore throat.   Eyes: Negative for redness.  Respiratory: Negative for cough.   Cardiovascular: Negative for chest pain.  Gastrointestinal: Negative for abdominal pain, diarrhea, nausea and vomiting.  Genitourinary: Negative for dysuria.  Musculoskeletal: Negative for myalgias.  Skin: Negative for rash.  Neurological: Positive for weakness and numbness. Negative for headaches.    Physical Exam Updated Vital Signs BP (!) 167/120   Pulse (!) 59   Temp 97.7 F (36.5 C) (Oral)   Resp 16   SpO2 95%   Physical Exam Vitals and nursing note reviewed.  Constitutional:      Appearance: He is well-developed.  HENT:     Head: Normocephalic and atraumatic.     Right Ear: Tympanic membrane, ear canal and external ear normal.     Left Ear: Tympanic membrane, ear canal and external ear normal.     Nose:  Nose normal.     Mouth/Throat:     Pharynx: Uvula midline.  Eyes:     General: Lids are normal.     Conjunctiva/sclera: Conjunctivae normal.     Pupils: Pupils are equal, round, and reactive to light.  Neck:     Vascular: No carotid bruit.     Comments: No carotid bruit detected.  Cardiovascular:     Rate and Rhythm: Normal rate and regular rhythm.  Pulmonary:     Effort: Pulmonary effort is normal.     Breath sounds: Normal breath sounds.  Abdominal:     Palpations: Abdomen is soft.     Tenderness: There is no abdominal tenderness.  Musculoskeletal:        General: Normal range of motion.     Cervical back: Normal range of motion and neck supple. No tenderness or bony tenderness.  Skin:    General: Skin is warm and dry.  Neurological:     Mental Status: He is alert and oriented to person, place, and time.     GCS: GCS eye subscore is 4. GCS verbal subscore is 5. GCS motor subscore is 6.     Cranial Nerves: No cranial nerve deficit.     Sensory: Sensory deficit present.     Motor: No weakness or abnormal muscle tone.     Coordination: Coordination normal.     Comments: Reports decreased sensation L fingers but is able to feel now. Slow but able to touch all fingers to thumb. Good grip strength and normal strength in wrist, eblow, shoulder. No facial droop.      ED Results / Procedures / Treatments   Labs (all labs ordered are listed, but only abnormal results are displayed) Labs Reviewed  CBC - Abnormal; Notable for the following components:      Result Value   RBC 4.06 (*)    All other components within normal limits  COMPREHENSIVE METABOLIC PANEL -  Abnormal; Notable for the following components:   Glucose, Bld 180 (*)    BUN 26 (*)    Creatinine, Ser 1.36 (*)    GFR calc non Af Amer 49 (*)    GFR calc Af Amer 57 (*)    All other components within normal limits  CBG MONITORING, ED - Abnormal; Notable for the following components:   Glucose-Capillary 150 (*)    All  other components within normal limits  SARS CORONAVIRUS 2 (TAT 6-24 HRS)  PROTIME-INR  APTT  DIFFERENTIAL  ETHANOL  RAPID URINE DRUG SCREEN, HOSP PERFORMED  URINALYSIS, ROUTINE W REFLEX MICROSCOPIC  CBG MONITORING, ED    ED ECG REPORT   Date: 11/14/2019  Rate: 55  Rhythm: sinus bradycardia  QRS Axis: normal  Intervals: normal  ST/T Wave abnormalities: normal  Conduction Disutrbances:none  Narrative Interpretation:   Old EKG Reviewed: unchanged  I have personally reviewed the EKG tracing and agree with the computerized printout as noted.  Radiology CT HEAD WO CONTRAST  Result Date: 11/14/2019 CLINICAL DATA:  Left hand numbness EXAM: CT HEAD WITHOUT CONTRAST TECHNIQUE: Contiguous axial images were obtained from the base of the skull through the vertex without intravenous contrast. COMPARISON:  December 25, 2015 FINDINGS: Brain: Mild diffuse atrophy slightly increased from prior study. There is no intracranial mass, hemorrhage, extra-axial fluid collection, or midline shift. There is a focal infarct in the posterior mid right cerebellum. There is also asymmetric decreased attenuation in the dentate nucleus on the right compared to the left. Recent infarcts in these areas of the right cerebellum are of concern. Elsewhere there is mild small vessel disease in the centra semiovale bilaterally. Vascular: No hyperdense vessel. There is calcification in each carotid siphon region. Skull: The bony calvarium appears intact. Sinuses/Orbits: There is mucosal thickening in each maxillary antrum. There is a retention cyst in the inferior right maxillary antrum. There is opacification in multiple ethmoid air cells. There is mucosal thickening in the anterior left sphenoid sinus. Orbits appear symmetric bilaterally. Other: Mastoid air cells are clear. There is debris in the right external auditory canal. IMPRESSION: Mild diffuse atrophy, slightly increased from prior study. Suspect recent and potentially  acute infarct in the posterior mid right cerebellum. There is decreased attenuation in the dentate nucleus of the right cerebellum compared to the left side which may represent a second nearby focus of recent and possibly acute infarct. Elsewhere there is mild periventricular small vessel disease. No mass or hemorrhage. There are foci of arterial vascular calcification. There is multifocal paranasal sinus disease. There is probable cerumen in the right external auditory canal. Electronically Signed   By: Lowella Grip III M.D.   On: 11/14/2019 15:16    Procedures Procedures (including critical care time)  Medications Ordered in ED Medications - No data to display  ED Course  I have reviewed the triage vital signs and the nursing notes.  Pertinent labs & imaging results that were available during my care of the patient were reviewed by me and considered in my medical decision making (see chart for details).  Patient seen and examined. Work-up reviewed.  Patient with improving symptoms after likely right cerebellar stroke.  Will need further evaluation and imaging.  Will discuss with on-call neuro hospitalist and admit to medicine.  Vital signs reviewed and are as follows: BP (!) 167/120   Pulse (!) 59   Temp 97.7 F (36.5 C) (Oral)   Resp 16   SpO2 95%   7:26 PM Discussed  with Dr. Cheral Marker. Med admit, neuro to consult.   CRITICAL CARE Performed by: Carlisle Cater PA-C Total critical care time: 30 minutes Critical care time was exclusive of separately billable procedures and treating other patients. Critical care was necessary to treat or prevent imminent or life-threatening deterioration. Critical care was time spent personally by me on the following activities: development of treatment plan with patient and/or surrogate as well as nursing, discussions with consultants, evaluation of patient's response to treatment, examination of patient, obtaining history from patient or surrogate,  ordering and performing treatments and interventions, ordering and review of laboratory studies, ordering and review of radiographic studies, pulse oximetry and re-evaluation of patient's condition.    MDM Rules/Calculators/A&P                      Admit.   Final Clinical Impression(s) / ED Diagnoses Final diagnoses:  Cerebellar stroke ([place])  Paroxysmal A-fib [facility])    Rx / DC Orders ED Discharge Orders    None       Suann Larry 11/14/19 Tressia Miners, MD 11/15/19 2033    Carlisle Cater, PA-C 11/25/19 1603    Long, Wonda Old[facility], MD 11/26/19 [phone]

## 2019-11-14 NOTE — ED Notes (Signed)
Attempted to call Marva Panda back with update; no answer. Left message for family/friend to call me back.

## 2019-11-14 NOTE — ED Notes (Signed)
Curtis Dunn  would like to be updated on status [phone]

## 2019-11-14 NOTE — ED Triage Notes (Signed)
Pt reports at 1030 this morning he felt numbness in his first three fingers of his left hand, no other symptoms. Pt states he cannot pick up things or button up his shirt like he normally can, no facial droop or unilateral weakness noted in triage. Pt taken to bridge at 1424 for Dr Jeanell Sparrow to assess. Per dr Jeanell Sparrow pt is not a Code Stroke. Pt taken back to triage to obtain bloodwork and EKG. Pt a.o

## 2019-11-14 NOTE — H&P (Signed)
History and Physical    Curtis Dunn OJJ:[id number] DOB: Oct 02, 1941 DOA: 11/14/2019  PCP: Reynold Bowen, MD  Patient coming from: Home.  Chief Complaint: Numbness of the left hand.  HPI: Curtis Dunn is a 78 y.o. male with history of paroxysmal atrial fibrillation on Xarelto, diabetes mellitus type 1, hypertension, hyperlipidemia started to experience left hand numbness with poor coordination felt as if he is fingers are paralyzed and was not able to feel anything with it since 10:30 AM today.  Patient felt that he may not be able to ambulate and had to sit on the floor for almost 45 minutes since he was concerned about coordination fall.  Patient was brought to the ER.  Patient did not have any difficulty talking swallowing or visual symptoms.  Has no weakness of other extremities.  Patient had difficulty buttoning up his shirt with his left upper extremity.  ED Course: In the ER patient symptoms gradually improved.  CT of the head done shows possible acute stroke involving the right cerebellum.  Neurologist on-call was consulted.  Patient passed swallow.  Labs show negative for COVID-19.  EKG shows sinus bradycardia with heart rate around 55 bpm.  Creatinine was 1.3 appears to be baseline.  Review of Systems: As per HPI, rest all negative.   Past Medical History:  Diagnosis Date  . Broken neck ([place])    in March 2015  . Cataract   . Colon cancer ([place])   . Colon polyp   . Diabetes mellitus    Type 1  . Dysrhythmia    a fib  . Gout   . Hyperlipidemia   . Hypertension   . Lipoma of back   . Paroxysmal atrial fibrillation [facility])     Past Surgical History:  Procedure Laterality Date  . ANKLE SURGERY     right  . APPENDECTOMY  2001  . arm surgery     right shoulder surgery  . CATARACT EXTRACTION EXTRACAPSULAR Bilateral   . COLON RESECTION    . ELBOW FRACTURE SURGERY    . INGUINAL HERNIA REPAIR Left 03/13/2016   Procedure: LEFT INGUINAL HERNIA REPAIR WITH  UNBILICAL HERNIA REPAIR;  Surgeon: [name], MD;  Location: [place];  Service: General;  Laterality: Left;  . INSERTION OF MESH Left 03/13/2016   Procedure: INSERTION OF MESH;  Surgeon: [name], MD;  Location: [place];  Service: General;  Laterality: Left;  [name] MASS EXCISION N/A 07/20/2019   Procedure: EXCISION OF BACK MASS;  Surgeon: Rolm Bookbinder, MD;  Location: [place];  Service: General;  Laterality: N/A;  MAC AND LOCAL  . VASECTOMY       reports that he has never smoked. He has never used smokeless tobacco. He reports current alcohol use. He reports that he does not use drugs.  No Known Allergies  Family History  Problem Relation Age of Onset  . Heart disease Father   . Colon cancer Neg Hx   . Esophageal cancer Neg Hx   . Rectal cancer Neg Hx   . Stomach cancer Neg Hx     Prior to Admission medications   Medication Sig Start Date End Date Taking? Authorizing Provider  alfuzosin (UROXATRAL) 10 MG 24 hr tablet Take 10 mg by mouth daily with breakfast.   Yes [provider]  allopurinol (ZYLOPRIM) 100 MG tablet Take 100 mg by mouth daily. 06/02/19  Yes [provider]  amLODipine (NORVASC) 5 MG tablet Take 1 tablet by mouth  once daily 07/27/19  Yes Burtis Junes, NP  Ascorbic Acid (VITAMIN C) 1000 MG tablet Take 1,000 mg by mouth daily.   Yes [provider]  Cholecalciferol (VITAMIN D3) 250 MCG (10000 UT) capsule Take 1 capsule by mouth daily.    Yes [provider]  Coenzyme Q-10 200 MG CAPS Take 1 capsule by mouth daily.   Yes [provider]  Cyanocobalamin 5000 MCG TBDP Take 1 tablet by mouth daily.   Yes [provider]  furosemide (LASIX) 40 MG tablet Take 20 mg by mouth 2 (two) times daily.    Yes Anda Kraft, MD  Cathrine Muster 550 MG CAPS Take 550 mg by mouth daily. 03/19/17  Yes Jerline Pain, MD  insulin aspart (NOVOLOG) 100 UNIT/ML injection Inject 10 Units into the skin 3 (three) times  daily before meals.    Yes [provider]  insulin glargine (LANTUS) 100 UNIT/ML injection Inject 14 units at breakfast and 12 units at bedtime   Yes [provider]  lovastatin (MEVACOR) 20 MG tablet Take 20 mg by mouth at bedtime.   Yes [provider]  Magnesium ([employer]S TRIPLE MAGNESIUM COMPLEX) 400 MG CAPS Take 1 capsule by mouth daily.   Yes [provider]  metoprolol succinate (TOPROL-XL) 25 MG 24 hr tablet Take 1 tablet by mouth once daily 06/30/19  Yes Skains, Thana Farr, MD  Misc Natural Products (BETA-SITOSTEROL PLANT STEROLS PO) Take 1 tablet by mouth 2 (two) times a day. PRODUCT NAME: SUPER BETA PROSTATE (beta sitosterol, lycopene, reishi mushroom)   Yes [provider]  Nutritional Supplements (DHEA PO) Take 100 mg by mouth daily.   Yes [provider]  Omega 3 1200 MG CAPS Take 1 capsule by mouth daily.   Yes [provider]  Potassium 99 MG TABS Take 1 tablet by mouth daily.    Yes [provider]  ramipril (ALTACE) 10 MG capsule Take 1 capsule (10 mg total) by mouth 2 (two) times daily. 11/01/18  Yes Burtis Junes, NP  vitamin E 1000 UNIT capsule Take 1,000 Units by mouth daily.    Yes [provider]  XARELTO 15 MG TABS tablet TAKE 1 TABLET BY MOUTH ONCE DAILY WITH SUPPER Patient taking differently: Take 15 mg by mouth daily with supper.  05/31/19  Yes Burtis Junes, NP  Zinc 50 MG CAPS Take 1 tablet by mouth daily.    Yes [provider]  Continuous Blood Gluc Sensor (FREESTYLE LIBRE 14 DAY SENSOR) MISC USE TO MONITOR BLOOD GLUCOSE. CHANGE EVERY 14 DAYS. 02/14/19   [provider]  hydrALAZINE (APRESOLINE) 50 MG tablet Take 1 tablet (50 mg total) by mouth 2 (two) times daily. 01/04/19 07/13/19  Burtis Junes, NP  Nutritional Supplements (PYCNOGENOL PO) Take 100 mg by mouth daily.    [provider]    Physical Exam: Constitutional: Moderately built and nourished. Vitals:    11/14/19 1900 11/14/19 1930 11/14/19 2030 11/14/19 2100  BP: (!) 146/63 (!) 157/74 (!) 141/60 131/67  Pulse: 61   (!) 55  Resp: _0 Temp:      TempSrc:      SpO2: 95%   95%   Eyes: Anicteric no pallor. ENMT: No discharge from the ears eyes nose or mouth. Neck: No mass felt.  No neck rigidity. Respiratory: No rhonchi or crepitations. Cardiovascular: S1-S2 heard. Abdomen: Soft nontender bowel sound present. Musculoskeletal: No edema.  No joint effusion. Skin: No  rash. Neurologic: Alert awake oriented to time place and person.  Moves all extremities 5 x 5.  No definite dysdiadochokinesia.  On the left side at the time of my exam.  Pupils equal reactive light no facial asymmetry. Psychiatric: Appears normal.   Labs on Admission: I have personally reviewed following labs and imaging studies  CBC: Recent Labs  Lab 11/14/19 1437  WBC 7.1  NEUTROABS 5.2  HGB 13.0  HCT 39.4  MCV 97.0  PLT 975   Basic Metabolic Panel: Recent Labs  Lab 11/14/19 1437  NA 140  K 4.2  CL 100  CO2 28  GLUCOSE 180*  BUN 26*  CREATININE 1.36*  CALCIUM 9.8   GFR: CrCl cannot be calculated (Unknown ideal weight.). Liver Function Tests: Recent Labs  Lab 11/14/19 1437  AST 30  ALT 20  ALKPHOS 62  BILITOT 0.8  PROT 6.5  ALBUMIN 4.1   No results for input(s): LIPASE, AMYLASE in the last 168 hours. No results for input(s): AMMONIA in the last 168 hours. Coagulation Profile: Recent Labs  Lab 11/14/19 1437  INR 1.0   Cardiac Enzymes: No results for input(s): CKTOTAL, CKMB, CKMBINDEX, TROPONINI in the last 168 hours. BNP (last 3 results) No results for input(s): PROBNP in the last 8760 hours. HbA1C: No results for input(s): HGBA1C in the last 72 hours. CBG: Recent Labs  Lab 11/14/19 1425  GLUCAP 150*   Lipid Profile: No results for input(s): CHOL, HDL, LDLCALC, TRIG, CHOLHDL, LDLDIRECT in the last 72 hours. Thyroid Function Tests: No results for input(s): TSH,  T4TOTAL, FREET4, T3FREE, THYROIDAB in the last 72 hours. Anemia Panel: No results for input(s): VITAMINB12, FOLATE, FERRITIN, TIBC, IRON, RETICCTPCT in the last 72 hours. Urine analysis:    Component Value Date/Time   COLORURINE YELLOW 12/25/2015 1904   APPEARANCEUR CLEAR 12/25/2015 1904   LABSPEC 1.025 12/25/2015 1904   PHURINE 5.0 12/25/2015 1904   GLUCOSEU >1000 (A) 12/25/2015 1904   HGBUR NEGATIVE 12/25/2015 1904   BILIRUBINUR NEGATIVE 12/25/2015 1904   KETONESUR NEGATIVE 12/25/2015 1904   PROTEINUR NEGATIVE 12/25/2015 1904   UROBILINOGEN 1.0 02/12/2014 2348   NITRITE NEGATIVE 12/25/2015 1904   LEUKOCYTESUR NEGATIVE 12/25/2015 1904   Sepsis Labs: _0 (procalcitonin:4,lacticidven:4) )No results found for this or any previous visit (from the past 240 hour(s)).   Radiological Exams on Admission: CT HEAD WO CONTRAST  Result Date: 11/14/2019 CLINICAL DATA:  Left hand numbness EXAM: CT HEAD WITHOUT CONTRAST TECHNIQUE: Contiguous axial images were obtained from the base of the skull through the vertex without intravenous contrast. COMPARISON:  December 25, 2015 FINDINGS: Brain: Mild diffuse atrophy slightly increased from prior study. There is no intracranial mass, hemorrhage, extra-axial fluid collection, or midline shift. There is a focal infarct in the posterior mid right cerebellum. There is also asymmetric decreased attenuation in the dentate nucleus on the right compared to the left. Recent infarcts in these areas of the right cerebellum are of concern. Elsewhere there is mild small vessel disease in the centra semiovale bilaterally. Vascular: No hyperdense vessel. There is calcification in each carotid siphon region. Skull: The bony calvarium appears intact. Sinuses/Orbits: There is mucosal thickening in each maxillary antrum. There is a retention cyst in the inferior right maxillary antrum. There is opacification in multiple ethmoid air cells. There is mucosal thickening in the  anterior left sphenoid sinus. Orbits appear symmetric bilaterally. Other: Mastoid air cells are clear. There is debris in the right external auditory canal. IMPRESSION: Mild diffuse atrophy, slightly increased from  prior study. Suspect recent and potentially acute infarct in the posterior mid right cerebellum. There is decreased attenuation in the dentate nucleus of the right cerebellum compared to the left side which may represent a second nearby focus of recent and possibly acute infarct. Elsewhere there is mild periventricular small vessel disease. No mass or hemorrhage. There are foci of arterial vascular calcification. There is multifocal paranasal sinus disease. There is probable cerumen in the right external auditory canal. Electronically Signed   By: Lowella Grip III M.D.   On: 11/14/2019 15:16    EKG: Independently reviewed.  Sinus bradycardia with heart rate around 55 beats minute.  Assessment/Plan Principal Problem:   Acute CVA (cerebrovascular accident) [facility]) Active Problems:   Type 1 diabetes mellitus ([place])   Essential hypertension   PAF (paroxysmal atrial fibrillation) ([place])    1. Acute CVA -discussed with neurologist Dr. Cheral Marker who advised to continue Xarelto.  Patient passed swallow will keep patient on neurochecks.  CT angiogram of the head and neck MRI of the brain 2D echo has been ordered.  Check hemoglobin A1c lipid panel physical therapy consult.  Patient is on statins. 2. Diabetes mellitus type 1 on Lantus insulin which will be continued as taken at home twice daily with sliding scale coverage.  Follow hemoglobin A1c. 3. Paroxysmal atrial fibrillation presently heart rate around 55 bpm and sinus bradycardia with patient on Xarelto.  On metoprolol with holding orders. 4. Hypertension on amlodipine ramipril hydralazine metoprolol.  Allow for permissive hypertension. 5. Chronic kidney disease stage III creatinine appears to be at baseline.  Note that patient is on ACE  inhibitor. 6. History of gout on allopurinol. 7. History of colon cancer per the chart. 8. Takes Lasix which we will hold off for now.   DVT prophylaxis: Xarelto. Code Status: Full code. Family Communication: Discussed with patient. Disposition Plan: Home. Consults called: Neurologist. Admission status: Observation.   Rise Patience MD [facility] Pager [phone].  If 7PM-7AM, please contact night-coverage [email or web address] Password [facility]  11/14/2019, 10:25 PM

## 2019-11-14 NOTE — Consult Note (Addendum)
NEURO HOSPITALIST CONSULT NOTE   Requestig physician: Dr. Laverta Baltimore  Reason for Consult: Left hand numbness and weakness  History obtained from:  Patient and Chart    HPI:                                                                                                                                          Curtis Dunn is an 78 y.o. male with PAF on Xarelto, DM type I, HTN and HLD who presented to the ED with complaint of left hand numbness, weakness and poor coordination of acute onset. This manifested as difficulty with fine movements such as replacing batteries in his hearing aid and buttoning his shirt, as well as dropping objects. He also could not feel the tips of digits 2-4 on his left hand. Symptoms began acutely at 10:30 AM.   Denied vision changes, facial droop, speech difficulties or lower extremity weakness. Also with no SOB or CP.   CT head: Mild diffuse atrophy, slightly increased from prior study. Suspect recent and potentially acute infarct in the posterior mid right cerebellum. There is decreased attenuation in the dentate nucleus of the right cerebellum compared to the left side which may represent a second nearby focus of recent and possibly acute infarct. Elsewhere there is mild periventricular small vessel disease. No mass or hemorrhage. There are foci of arterial vascular calcification. There is multifocal paranasal sinus disease. There is probable cerumen in the right external auditory canal.  Past Medical History:  Diagnosis Date  . Broken neck ([place])    in March 2015  . Cataract   . Colon cancer ([place])   . Colon polyp   . Diabetes mellitus    Type 1  . Dysrhythmia    a fib  . Gout   . Hyperlipidemia   . Hypertension   . Lipoma of back   . Paroxysmal atrial fibrillation [facility])     Past Surgical History:  Procedure Laterality Date  . ANKLE SURGERY     right  . APPENDECTOMY  2001  . arm surgery     right shoulder surgery  . CATARACT  EXTRACTION EXTRACAPSULAR Bilateral   . COLON RESECTION    . ELBOW FRACTURE SURGERY    . INGUINAL HERNIA REPAIR Left 03/13/2016   Procedure: LEFT INGUINAL HERNIA REPAIR WITH UNBILICAL HERNIA REPAIR;  Surgeon: [name], MD;  Location: [place];  Service: General;  Laterality: Left;  . INSERTION OF MESH Left 03/13/2016   Procedure: INSERTION OF MESH;  Surgeon: [name], MD;  Location: [place];  Service: General;  Laterality: Left;  [name] MASS EXCISION N/A 07/20/2019   Procedure: EXCISION OF BACK MASS;  Surgeon: Rolm Bookbinder, MD;  Location: [place];  Service: General;  Laterality: N/A;  MAC  AND LOCAL  . VASECTOMY      Family History  Problem Relation Age of Onset  . Heart disease Father   . Colon cancer Neg Hx   . Esophageal cancer Neg Hx   . Rectal cancer Neg Hx   . Stomach cancer Neg Hx               Social History:  reports that he has never smoked. He has never used smokeless tobacco. He reports current alcohol use. He reports that he does not use drugs.  No Known Allergies  MEDICATIONS:                                                                                                                      No current facility-administered medications on file prior to encounter.   Current Outpatient Medications on File Prior to Encounter  Medication Sig Dispense Refill  . alfuzosin (UROXATRAL) 10 MG 24 hr tablet Take 10 mg by mouth daily with breakfast.    . allopurinol (ZYLOPRIM) 100 MG tablet Take 100 mg by mouth daily.    [name] amLODipine (NORVASC) 5 MG tablet Take 1 tablet by mouth once daily 90 tablet 3  . Ascorbic Acid (VITAMIN C) 1000 MG tablet Take 1,000 mg by mouth daily.    . Cholecalciferol (VITAMIN D3) 250 MCG (10000 UT) capsule Take 1 capsule by mouth daily.     . Coenzyme Q-10 200 MG CAPS Take 1 capsule by mouth daily.    . Cyanocobalamin 5000 MCG TBDP Take 1 tablet by mouth daily.    . furosemide (LASIX) 40 MG tablet Take 20 mg by mouth 2 (two) times  daily.     [name] Hawthorne Berry 550 MG CAPS Take 550 mg by mouth daily.  0  . insulin aspart (NOVOLOG) 100 UNIT/ML injection Inject 10 Units into the skin 3 (three) times daily before meals.     . insulin glargine (LANTUS) 100 UNIT/ML injection Inject 14 units at breakfast and 12 units at bedtime    . lovastatin (MEVACOR) 20 MG tablet Take 20 mg by mouth at bedtime.    . Magnesium ([employer]S TRIPLE MAGNESIUM COMPLEX) 400 MG CAPS Take 1 capsule by mouth daily.    . metoprolol succinate (TOPROL-XL) 25 MG 24 hr tablet Take 1 tablet by mouth once daily 90 tablet 3  . Misc Natural Products (BETA-SITOSTEROL PLANT STEROLS PO) Take 1 tablet by mouth 2 (two) times a day. PRODUCT NAME: SUPER BETA PROSTATE (beta sitosterol, lycopene, reishi mushroom)    . Nutritional Supplements (DHEA PO) Take 100 mg by mouth daily.    . Omega 3 1200 MG CAPS Take 1 capsule by mouth daily.    . Potassium 99 MG TABS Take 1 tablet by mouth daily.     . ramipril (ALTACE) 10 MG capsule Take 1 capsule (10 mg total) by mouth 2 (two) times daily. 180 capsule 3  . vitamin E 1000 UNIT capsule Take 1,000 Units by mouth daily.     [name]  XARELTO 15 MG TABS tablet TAKE 1 TABLET BY MOUTH ONCE DAILY WITH SUPPER (Patient taking differently: Take 15 mg by mouth daily with supper. ) 30 tablet 5  . Zinc 50 MG CAPS Take 1 tablet by mouth daily.     . Continuous Blood Gluc Sensor (FREESTYLE LIBRE 14 DAY SENSOR) MISC USE TO MONITOR BLOOD GLUCOSE. CHANGE EVERY 14 DAYS.    [name] hydrALAZINE (APRESOLINE) 50 MG tablet Take 1 tablet (50 mg total) by mouth 2 (two) times daily. 180 tablet 3  . Nutritional Supplements (PYCNOGENOL PO) Take 100 mg by mouth daily.       ROS:                                                                                                                                       As per HPI. Does not endorse any additional complaints.    Blood pressure (!) 167/120, pulse (!) 59, temperature 97.7 F (36.5 C), temperature source Oral, resp.  rate 16, SpO2 95 %.   General Examination:                                                                                                       Physical Exam  HEENT-  [place]/AT   Lungs- Respirations unlabored Extremities- No edema. Chronic pigmentary changes to distal BLE noted.   Neurological Examination Mental Status: Alert, fully oriented, thought content appropriate. Bright affect. Pleasant and cooperative. Speech fluent with intact naming and comprehension.  Able to follow a 3 step command without difficulty. Able to rapidly recite the months of the year backwards.  Cranial Nerves: II: Visual fields intact bilaterally all 4 quadrants. Had extinction to DSS on the right.   III,IV, VI: No ptosis. EOMI with no nystagmus.   V,VII: Smile symmetric, facial temp sensation equal bilaterally VIII: hearing intact to conversation IX,X: Palate rises symmetrically XI: Asymmetric shoulder shrug in the context of prior right shoulder injury/surgery XII: midline tongue extension Motor: RUE and RLE 5/5 LUE 5/5, however there is subtle decreased spontaneous movement as well as mild dystonic posturing at times.  LLE 5/5 Sensory: Temp and light touch intact bilaterally in proximal limbs. There is stocking-distribution temp sensation deficit bilaterally. Positive for extinction on the left with DSS.  Deep Tendon Reflexes: 2+ bilateral brachioradialis, biceps, patellae and left achilles. 0 right achilles. Toes mute bilaterally.  Cerebellar: No ataxia on the right. There is dysmetria with left FNF and H-S.  Gait: Deferred   Lab Results:  Basic Metabolic Panel: Recent Labs  Lab 11/14/19 1437  NA 140  K 4.2  CL 100  CO2 28  GLUCOSE 180*  BUN 26*  CREATININE 1.36*  CALCIUM 9.8    CBC: Recent Labs  Lab 11/14/19 1437  WBC 7.1  NEUTROABS 5.2  HGB 13.0  HCT 39.4  MCV 97.0  PLT 188    Cardiac Enzymes: No results for input(s): CKTOTAL, CKMB, CKMBINDEX, TROPONINI in the last 168  hours.  Lipid Panel: No results for input(s): CHOL, TRIG, HDL, CHOLHDL, VLDL, LDLCALC in the last 168 hours.  Imaging: CT HEAD WO CONTRAST  Result Date: 11/14/2019 CLINICAL DATA:  Left hand numbness EXAM: CT HEAD WITHOUT CONTRAST TECHNIQUE: Contiguous axial images were obtained from the base of the skull through the vertex without intravenous contrast. COMPARISON:  December 25, 2015 FINDINGS: Brain: Mild diffuse atrophy slightly increased from prior study. There is no intracranial mass, hemorrhage, extra-axial fluid collection, or midline shift. There is a focal infarct in the posterior mid right cerebellum. There is also asymmetric decreased attenuation in the dentate nucleus on the right compared to the left. Recent infarcts in these areas of the right cerebellum are of concern. Elsewhere there is mild small vessel disease in the centra semiovale bilaterally. Vascular: No hyperdense vessel. There is calcification in each carotid siphon region. Skull: The bony calvarium appears intact. Sinuses/Orbits: There is mucosal thickening in each maxillary antrum. There is a retention cyst in the inferior right maxillary antrum. There is opacification in multiple ethmoid air cells. There is mucosal thickening in the anterior left sphenoid sinus. Orbits appear symmetric bilaterally. Other: Mastoid air cells are clear. There is debris in the right external auditory canal. IMPRESSION: Mild diffuse atrophy, slightly increased from prior study. Suspect recent and potentially acute infarct in the posterior mid right cerebellum. There is decreased attenuation in the dentate nucleus of the right cerebellum compared to the left side which may represent a second nearby focus of recent and possibly acute infarct. Elsewhere there is mild periventricular small vessel disease. No mass or hemorrhage. There are foci of arterial vascular calcification. There is multifocal paranasal sinus disease. There is probable cerumen in the  right external auditory canal. Electronically Signed   By: Lowella Grip III M.D.   On: 11/14/2019 15:16    Assessment: 78 year old male with PAF, presenting with left hand numbness, weakness and incoordination.  1. Exam reveals left sided motor and sensory findings as well as dysmetria, suggestive of a right parietofrontal or thalamic stroke.  2. CT head reveals equivocal subacute infarctions in the right cerebellar hemisphere. 3. Stroke risk factors: DM type I, HTN and HLD  4. Stroke etiology most likely cardioembolic, but will need to rule out other etiologies such as severe intracranial atherosclerotic disease or carotid stenosis.  5. Not a tPA candidate due to anticoagulation with Xarelto.   Recommendations: 1. MRI brain 2. CTA of head and neck 3. TTE 4. Cardiac telemetry 5. Continue Xarelto 6. Modified permissive HTN protocol given advanced age: Treat if SBP > 180 7. Due to advanced age, risks of statin use most likely outweigh benefits.  8. PT/OT/Speech    Electronically signed: Dr. Kerney Elbe 11/14/2019, 7:28 PM

## 2019-11-15 ENCOUNTER — Encounter (HOSPITAL_COMMUNITY): Payer: Self-pay | Admitting: *Deleted

## 2019-11-15 ENCOUNTER — Inpatient Hospital Stay (HOSPITAL_COMMUNITY): Payer: HMO

## 2019-11-15 ENCOUNTER — Observation Stay (HOSPITAL_COMMUNITY): Payer: HMO

## 2019-11-15 ENCOUNTER — Other Ambulatory Visit: Payer: Self-pay | Admitting: *Deleted

## 2019-11-15 DIAGNOSIS — I4510 Unspecified right bundle-branch block: Secondary | ICD-10-CM | POA: Diagnosis present

## 2019-11-15 DIAGNOSIS — E785 Hyperlipidemia, unspecified: Secondary | ICD-10-CM | POA: Diagnosis present

## 2019-11-15 DIAGNOSIS — R233 Spontaneous ecchymoses: Secondary | ICD-10-CM | POA: Diagnosis not present

## 2019-11-15 DIAGNOSIS — T45515A Adverse effect of anticoagulants, initial encounter: Secondary | ICD-10-CM | POA: Diagnosis not present

## 2019-11-15 DIAGNOSIS — I129 Hypertensive chronic kidney disease with stage 1 through stage 4 chronic kidney disease, or unspecified chronic kidney disease: Secondary | ICD-10-CM | POA: Diagnosis present

## 2019-11-15 DIAGNOSIS — E10649 Type 1 diabetes mellitus with hypoglycemia without coma: Secondary | ICD-10-CM | POA: Diagnosis not present

## 2019-11-15 DIAGNOSIS — R29701 NIHSS score 1: Secondary | ICD-10-CM | POA: Diagnosis present

## 2019-11-15 DIAGNOSIS — I6340 Cerebral infarction due to embolism of unspecified cerebral artery: Secondary | ICD-10-CM

## 2019-11-15 DIAGNOSIS — I6529 Occlusion and stenosis of unspecified carotid artery: Secondary | ICD-10-CM | POA: Diagnosis present

## 2019-11-15 DIAGNOSIS — E1059 Type 1 diabetes mellitus with other circulatory complications: Secondary | ICD-10-CM | POA: Diagnosis not present

## 2019-11-15 DIAGNOSIS — I63233 Cerebral infarction due to unspecified occlusion or stenosis of bilateral carotid arteries: Secondary | ICD-10-CM | POA: Diagnosis not present

## 2019-11-15 DIAGNOSIS — I6389 Other cerebral infarction: Secondary | ICD-10-CM | POA: Diagnosis not present

## 2019-11-15 DIAGNOSIS — N183 Chronic kidney disease, stage 3 unspecified: Secondary | ICD-10-CM | POA: Diagnosis present

## 2019-11-15 DIAGNOSIS — E1051 Type 1 diabetes mellitus with diabetic peripheral angiopathy without gangrene: Secondary | ICD-10-CM | POA: Diagnosis present

## 2019-11-15 DIAGNOSIS — G8324 Monoplegia of upper limb affecting left nondominant side: Secondary | ICD-10-CM | POA: Diagnosis present

## 2019-11-15 DIAGNOSIS — I639 Cerebral infarction, unspecified: Secondary | ICD-10-CM

## 2019-11-15 DIAGNOSIS — I10 Essential (primary) hypertension: Secondary | ICD-10-CM | POA: Diagnosis not present

## 2019-11-15 DIAGNOSIS — I63511 Cerebral infarction due to unspecified occlusion or stenosis of right middle cerebral artery: Secondary | ICD-10-CM | POA: Diagnosis not present

## 2019-11-15 DIAGNOSIS — Z7901 Long term (current) use of anticoagulants: Secondary | ICD-10-CM | POA: Diagnosis not present

## 2019-11-15 DIAGNOSIS — I63411 Cerebral infarction due to embolism of right middle cerebral artery: Secondary | ICD-10-CM | POA: Diagnosis present

## 2019-11-15 DIAGNOSIS — Z20828 Contact with and (suspected) exposure to other viral communicable diseases: Secondary | ICD-10-CM | POA: Diagnosis present

## 2019-11-15 DIAGNOSIS — Z794 Long term (current) use of insulin: Secondary | ICD-10-CM | POA: Diagnosis not present

## 2019-11-15 DIAGNOSIS — Z85038 Personal history of other malignant neoplasm of large intestine: Secondary | ICD-10-CM | POA: Diagnosis not present

## 2019-11-15 DIAGNOSIS — I63532 Cerebral infarction due to unspecified occlusion or stenosis of left posterior cerebral artery: Secondary | ICD-10-CM | POA: Diagnosis not present

## 2019-11-15 DIAGNOSIS — Z8249 Family history of ischemic heart disease and other diseases of the circulatory system: Secondary | ICD-10-CM | POA: Diagnosis not present

## 2019-11-15 DIAGNOSIS — D6832 Hemorrhagic disorder due to extrinsic circulating anticoagulants: Secondary | ICD-10-CM | POA: Diagnosis not present

## 2019-11-15 DIAGNOSIS — E1022 Type 1 diabetes mellitus with diabetic chronic kidney disease: Secondary | ICD-10-CM | POA: Diagnosis present

## 2019-11-15 DIAGNOSIS — M109 Gout, unspecified: Secondary | ICD-10-CM | POA: Diagnosis present

## 2019-11-15 DIAGNOSIS — I480 Paroxysmal atrial fibrillation: Secondary | ICD-10-CM | POA: Diagnosis present

## 2019-11-15 LAB — URINALYSIS, ROUTINE W REFLEX MICROSCOPIC
Bacteria, UA: NONE SEEN
Bilirubin Urine: NEGATIVE
Glucose, UA: >=500 mg/dL — AB
Hgb urine dipstick: NEGATIVE
Ketones, ur: 20 mg/dL — AB
Leukocytes,Ua: NEGATIVE
Nitrite: NEGATIVE
Protein, ur: NEGATIVE mg/dL
Specific Gravity, Urine: >1.046 — ABNORMAL HIGH (ref 1.005–1.030)
pH: 6 (ref 5.0–8.0)

## 2019-11-15 LAB — RAPID URINE DRUG SCREEN, HOSP PERFORMED
Amphetamines: NOT DETECTED
Barbiturates: NOT DETECTED
Benzodiazepines: NOT DETECTED
Cocaine: NOT DETECTED
Opiates: NOT DETECTED
Tetrahydrocannabinol: NOT DETECTED

## 2019-11-15 LAB — CBG MONITORING, ED
Glucose-Capillary: 353 mg/dL — ABNORMAL HIGH (ref 70–99)
Glucose-Capillary: 341 mg/dL — ABNORMAL HIGH (ref 70–99)
Glucose-Capillary: 263 mg/dL — ABNORMAL HIGH (ref 70–99)

## 2019-11-15 LAB — LIPID PANEL
Cholesterol: 126 mg/dL (ref 0–200)
HDL: 46 mg/dL (ref >40)
LDL Cholesterol: 68 mg/dL (ref 0–99)
Total CHOL/HDL Ratio: 2.7 RATIO
Triglycerides: 62 mg/dL (ref <150)
VLDL: 12 mg/dL (ref 0–40)

## 2019-11-15 LAB — SARS CORONAVIRUS 2 (TAT 6-24 HRS): SARS Coronavirus 2: NEGATIVE

## 2019-11-15 LAB — HEMOGLOBIN A1C
Hgb A1c MFr Bld: 6.4 % — ABNORMAL HIGH (ref 4.8–5.6)
Mean Plasma Glucose: 136.98 mg/dL

## 2019-11-15 LAB — ECHOCARDIOGRAM COMPLETE

## 2019-11-15 MED ORDER — HYDRALAZINE HCL 20 MG/ML IJ SOLN: 10.0000 mg | Freq: Four times a day (QID) | INTRAMUSCULAR | Status: DC | PRN

## 2019-11-15 MED ORDER — ASPIRIN EC 325 MG PO TBEC
325.0000 mg | DELAYED_RELEASE_TABLET | Freq: Every day | ORAL | Status: DC
  Administered 2019-11-15 – 2019-11-16 (×2): 325 mg via ORAL
  Filled 2019-11-15 (×3): qty 1

## 2019-11-15 MED ORDER — INSULIN GLARGINE 100 UNIT/ML ~~LOC~~ SOLN
12.0000 [IU] | Freq: Every day | SUBCUTANEOUS | Status: DC
  Administered 2019-11-15: 12 [IU] via SUBCUTANEOUS
  Filled 2019-11-15 (×2): qty 0.12

## 2019-11-15 MED ORDER — INSULIN ASPART 100 UNIT/ML ~~LOC~~ SOLN: 0.0000 [IU] | Freq: Every day | SUBCUTANEOUS | Status: DC

## 2019-11-15 MED ORDER — INSULIN GLARGINE 100 UNIT/ML ~~LOC~~ SOLN
14.0000 [IU] | Freq: Every day | SUBCUTANEOUS | Status: DC
  Administered 2019-11-15 – 2019-11-16 (×2): 14 [IU] via SUBCUTANEOUS
  Filled 2019-11-15 (×2): qty 0.14

## 2019-11-15 MED ORDER — INSULIN ASPART 100 UNIT/ML ~~LOC~~ SOLN
0.0000 [IU] | Freq: Three times a day (TID) | SUBCUTANEOUS | Status: DC
  Administered 2019-11-15: 11 [IU] via SUBCUTANEOUS
  Administered 2019-11-15: 8 [IU] via SUBCUTANEOUS
  Administered 2019-11-16: 15 [IU] via SUBCUTANEOUS

## 2019-11-15 NOTE — ED Notes (Signed)
PT at bedside.

## 2019-11-15 NOTE — ED Notes (Signed)
Pt ambulatory to and from restroom with steady gait 

## 2019-11-15 NOTE — ED Notes (Signed)
Lunch tray at bedside. ?

## 2019-11-15 NOTE — ED Notes (Addendum)
Pt disposition changed to confirmed COVID negative per [facility]

## 2019-11-15 NOTE — Progress Notes (Signed)
OT Evaluation  PTA, pt lived alone with his cat and was independent with ADL, IADL and mobility. Pt drives and works out 3 days/week at [employer]. Pt does not appear to have any deficits. Pt scored a 24 on the Dynamic Gait Index and did not demonstrate any loss of balance during assessment. Cognition appears at baseline. Reviewed signs and symptoms of stroke using BeFast. Pt states his friend will assist as needed after DC. No further OT needed.     11/15/19 1600  OT Visit Information  Last OT Received On 11/15/19  Assistance Needed +1  History of Present Illness 78 y.o. male with a Past Medical History of paroxysmal atrial fibrillation on Xarelto, diabetes mellitus type 1, hypertension, hyperlipidemiawho presents with numbness of the left hand and has been diagnosed with large right MCA acute ischemic infarct and left PCA territory subacute large infarct with mild petechial hemorrhage without mass-effect.  Precautions  Precautions None  Home Living  Family/patient expects to be discharged to: Private residence  Living Arrangements Alone  Available Help at Discharge Friend(s);Available PRN/intermittently  Type of Home House  Home Access Stairs to enter  Home Layout Two level  Alternate Level Stairs-Number of Steps 14  Bathroom Shower/Tub Tub/shower unit;Walk-in Cytogeneticist Yes  How Accessible Accessible via walker  [place]  Prior Function  Level of Independence Independent  Comments works out at [employer] 3 days/week; enjoys sports  Engineer, petroleum HOH  Pain Assessment  Pain Assessment No/denies pain  Cognition  Arousal/Alertness Awake/alert  Behavior During Therapy WFL for tasks assessed/performed  Overall Cognitive Status Within Functional Limits for tasks assessed  Upper Extremity Assessment  Upper Extremity Assessment Overall WFL for tasks assessed (assymetry with shoulders but WFL)  Lower Extremity  Assessment  Lower Extremity Assessment Defer to PT evaluation  Cervical / Trunk Assessment  Cervical / Trunk Assessment Kyphotic  ADL  Overall ADL's  At baseline  Vision- History  Baseline Vision/History Wears glasses  Wears Glasses Reading only  Vision- Assessment  Vision Assessment? No apparent visual deficits  Perception  Perception Tested? Yes  Comments no deficits noted  Praxis  Praxis tested? WFL  Bed Mobility  Overal bed mobility Independent  Transfers  Overall transfer level Independent  Balance  Overall balance assessment Modified Independent  Standardized Balance Assessment  Standardized Balance Assessment  Dynamic Gait Index  Dynamic Gait Index  Level Surface 3  Change in Gait Speed 3  Gait with Horizontal Head Turns 3  Gait with Vertical Head Turns 3  Gait and Pivot Turn 3  Step Over Obstacle 3  Step Around Obstacles 3  Steps 3 (simulated)  Total Score 24  OT - End of Session  Activity Tolerance Patient tolerated treatment well  Patient left in bed;with call bell/phone within reach  Nurse Communication Mobility status  OT Assessment  OT Recommendation/Assessment Patient does not need any further OT services  OT Visit Diagnosis Unsteadiness on feet (R26.81)  AM-PAC OT "6 Clicks" Daily Activity Outcome Measure (Version 2)  Help from another person eating meals? 4  Help from another person taking care of personal grooming? 4  Help from another person toileting, which includes using toliet, bedpan, or urinal? 4  Help from another person bathing (including washing, rinsing, drying)? 4  Help from another person to put on and taking off regular upper body clothing? 4  Help from another person to put on and taking off regular lower body clothing? 4  6  Click Score 24  OT Recommendation  Follow Up Recommendations No OT follow up;Supervision - Intermittent  OT Equipment None recommended by OT  Acute Rehab OT Goals  Patient Stated Goal to go home  OT Goal  Formulation All assessment and education complete, DC therapy  OT Time Calculation  OT Start Time (ACUTE ONLY) 1627  OT Stop Time (ACUTE ONLY) 1655  OT Time Calculation (min) 28 min  OT General Charges  $OT Visit 1 Visit  OT Evaluation  $OT Eval Moderate Complexity 1 Mod  OT Treatments  $Self Care/Home Management  8-22 mins  Written Expression  Dominant Hand Right  Maurie Boettcher, OT/L   Acute OT Clinical Specialist [place] Pager [phone] Office [phone]

## 2019-11-15 NOTE — ED Notes (Signed)
Attempted to call report; nurse in a contact room and will call back.

## 2019-11-15 NOTE — ED Notes (Signed)
Marlan from [facility] called back to say that pt's bed had been taken away and given to another patient. Pt reconnected to monitoring equipment and updated on bed status.

## 2019-11-15 NOTE — Progress Notes (Signed)
[facility] PROGRESS NOTE  Curtis Dunn [id number] DOB: Nov 10, 1941 DOA: 11/14/2019 PCP: Reynold Bowen, MD  Assessment/Plan:  #1. Acute CVA. Hx afib on xaerlto. MRI 3.8 cm acute ischemic nonhemorrhagic right MCA territory infarct involving the right frontal operculum. No associated mass effect. Additional 2.8 cm early subacute left PCA territory infarct involving the parasagittal left occipital lobe. Associated mild petechial hemorrhage without mass effect. Underlying age-related cerebral atrophy with chronic left parietal and right cerebellar infarcts. Echo results pending, CTA head and neck Negative CTA for large vessel occlusion.  Atheromatous plaque about the carotid bifurcations with associated stenoses of up to 60% on the right and 50% on the left.  Atherosclerotic change throughout the carotid siphons with associated moderate diffuse narrowing. Atheromatous plaque involving the V4 segments bilaterally with associated stenoses, moderate on the left, severe on the right. Left vertebral artery is dominant. LDL 68, HgA1c 6.4, xarelto stopped given petechial hemorrhage per neuro, asa started and statin continued. Awaiting evaluation by PT/OT and neuro recs.   #2. Diabetes type I. Lantus per home regimen and SSI for optimal control. HgA1c 6.8.  #3. Paroxysmal atrial fibrillation. Rate controlled. Home meds include metoprolol and xarelto.  -xarelto stopped -asa 325mg  -continue BB  #4. Hypertension. Home meds include amlodipine, ramipril, hydralazine, metoprolol lasix -holding all but metorprolol -monitor  #5. CKD III. Stable at baseline  #6. Gout. Stable at baseline  Code Status: full Family Communication: patient Disposition Plan: home when ready   Consultants:  xu neuro  Procedures:  echo  Antibiotics:    HPI/Subjective: Denies pain or discomfort. Reports continued numbness in left fingertips  Objective: Vitals:   11/15/19 1215 11/15/19 1400  BP:     Pulse: (!) 56 75  Resp: 11 (!) 22  Temp:    SpO2: 97% 97%   No intake or output data in the 24 hours ending 11/15/19 1411 There were no vitals filed for this visit.  Exam:   General:  Awake alert no acute distress  Cardiovascular: rrr no mgr no LE edema  Respiratory: normal effort BS clear bilaterally no wheeze  Abdomen: non-distended non-tender +BS no guarding or rebounding  Musculoskeletal: joints without swelling/erythema  Neuro: speech clear, facial symmetry, tongue midline. Bilateral grip 5/5, LE strength 5/5 bialterally   Data Reviewed: Basic Metabolic Panel: Recent Labs  Lab 11/14/19 1437  NA 140  K 4.2  CL 100  CO2 28  GLUCOSE 180*  BUN 26*  CREATININE 1.36*  CALCIUM 9.8   Liver Function Tests: Recent Labs  Lab 11/14/19 1437  AST 30  ALT 20  ALKPHOS 62  BILITOT 0.8  PROT 6.5  ALBUMIN 4.1   No results for input(s): LIPASE, AMYLASE in the last 168 hours. No results for input(s): AMMONIA in the last 168 hours. CBC: Recent Labs  Lab 11/14/19 1437  WBC 7.1  NEUTROABS 5.2  HGB 13.0  HCT 39.4  MCV 97.0  PLT 188   Cardiac Enzymes: No results for input(s): CKTOTAL, CKMB, CKMBINDEX, TROPONINI in the last 168 hours. BNP (last 3 results) No results for input(s): BNP in the last 8760 hours.  ProBNP (last 3 results) No results for input(s): PROBNP in the last 8760 hours.  CBG: Recent Labs  Lab 11/14/19 1425 11/15/19 1014 11/15/19 1209  GLUCAP 150* 353* 341*    Recent Results (from the past 240 hour(s))  SARS CORONAVIRUS 2 (TAT 6-24 HRS) Nasopharyngeal Nasopharyngeal Swab     Status: None   Collection Time: 11/14/19  7:21 PM  Specimen: Nasopharyngeal Swab  Result Value Ref Range Status   SARS Coronavirus 2 NEGATIVE NEGATIVE Final    Comment: (NOTE) SARS-CoV-2 target nucleic acids are NOT DETECTED. The SARS-CoV-2 RNA is generally detectable in upper and lower respiratory specimens during the acute phase of infection. Negative results  do not preclude SARS-CoV-2 infection, do not rule out co-infections with other pathogens, and should not be used as the sole basis for treatment or other patient management decisions. Negative results must be combined with clinical observations, patient history, and epidemiological information. The expected result is Negative. Fact Sheet for Patients: SugarRoll.be Fact Sheet for Healthcare Providers: https://www.woods-mathews.com/ This test is not yet approved or cleared by the [place] FDA and  has been authorized for detection and/or diagnosis of SARS-CoV-2 by FDA under an Emergency Use Authorization (EUA). This EUA will remain  in effect (meaning this test can be used) for the duration of the COVID-19 declaration under Section 56 4(b)(1) of the Act, 21 U.S.C. section 360bbb-3(b)(1), unless the authorization is terminated or revoked sooner. Performed at [facility], [place] [address], [place], [place] [address]      Studies: CT Angio Head W or Wo Contrast  Result Date: 11/15/2019 CLINICAL DATA:  Follow-up examination for stroke. EXAM: CT ANGIOGRAPHY HEAD AND NECK TECHNIQUE: Multidetector CT imaging of the head and neck was performed using the standard protocol during bolus administration of intravenous contrast. Multiplanar CT image reconstructions and MIPs were obtained to evaluate the vascular anatomy. Carotid stenosis measurements (when applicable) are obtained utilizing NASCET criteria, using the distal internal carotid diameter as the denominator. CONTRAST:  25mL OMNIPAQUE IOHEXOL 350 MG/ML SOLN COMPARISON:  Head CT from 11/14/2019. FINDINGS: CTA NECK FINDINGS Aortic arch: Visualized aortic arch of normal caliber. Origin of the great vessels incompletely visualized on this exam. Visualized subclavian arteries widely patent. Right carotid system: Right common carotid artery widely patent from its origin to the bifurcation. Eccentric  plaque about the right bifurcation/proximal right ICA with associated stenosis of up to 60% by NASCET criteria. Right ICA otherwise patent distally to the skull base without stenosis, dissection or occlusion. Left carotid system: Visualized left CCA widely patent to the bifurcation without stenosis. Bulky calcified plaque about the left bifurcation/proximal left ICA with associated stenosis of up to 50% by NASCET criteria. Left ICA widely patent distally to the skull base without stenosis, dissection, or occlusion. Vertebral arteries: Both vertebral arteries arise from the subclavian arteries. Left vertebral artery dominant. Visualized vertebral arteries widely patent without stenosis, dissection, or occlusion. Right V2/V3 junction not well visualized due to streak artifact. Skeleton: No acute osseous finding. No discrete or worrisome osseous lesions. Moderate multilevel cervical spondylosis at C5-6 and C6-7. Basilar invagination with scattered osseous fragments about the dens noted, chronic in appearance. Other neck: No other acute soft tissue abnormality within the neck. No mass lesion or adenopathy. Upper chest: Visualized upper chest demonstrates no acute or significant finding. Review of the MIP images confirms the above findings CTA HEAD FINDINGS Anterior circulation: Petrous segments widely patent bilaterally. Calcified atheromatous plaque throughout the carotid siphons with associated moderate diffuse narrowing bilaterally. ICA termini well perfused. A1 segments patent bilaterally. Normal anterior communicating artery. Anterior cerebral arteries widely patent to their distal aspects. No M1 stenosis or occlusion. Normal MCA bifurcations. Distal MCA branches well perfused and symmetric. Posterior circulation: Calcified atheromatous plaque within the dominant left V4 segment with associated moderate approximate 50% stenosis. Left V4 otherwise patent to the vertebrobasilar junction. Patent left PICA. Right  vertebral patent as it courses into the cranial vault. Focal atheromatous plaque with resultant severe short-segment stenosis seen at the proximal right V4 segment (series 7, image 166). Right vertebral largely terminates in PICA, although a tiny branch ascending towards the vertebrobasilar junction. Right PICA patent as well. Basilar diminutive but patent to its distal aspect without stenosis. Superior cerebral arteries patent bilaterally. Both PCA supplied via hypoplastic P1 segments and robust posterior communicating arteries. Both PCAs widely patent to their distal aspects. Venous sinuses: Grossly patent allowing for timing the contrast bolus. Anatomic variants: None significant.  No intracranial aneurysm. Review of the MIP images confirms the above findings IMPRESSION: 1. Negative CTA for large vessel occlusion. 2. Atheromatous plaque about the carotid bifurcations with associated stenoses of up to 60% on the right and 50% on the left. 3. Atherosclerotic change throughout the carotid siphons with associated moderate diffuse narrowing. 4. Atheromatous plaque involving the V4 segments bilaterally with associated stenoses, moderate on the left, severe on the right. Left vertebral artery is dominant. Electronically Signed   By: Jeannine Boga M.D.   On: 11/15/2019 00:44   CT HEAD WO CONTRAST  Result Date: 11/14/2019 CLINICAL DATA:  Left hand numbness EXAM: CT HEAD WITHOUT CONTRAST TECHNIQUE: Contiguous axial images were obtained from the base of the skull through the vertex without intravenous contrast. COMPARISON:  December 25, 2015 FINDINGS: Brain: Mild diffuse atrophy slightly increased from prior study. There is no intracranial mass, hemorrhage, extra-axial fluid collection, or midline shift. There is a focal infarct in the posterior mid right cerebellum. There is also asymmetric decreased attenuation in the dentate nucleus on the right compared to the left. Recent infarcts in these areas of the right  cerebellum are of concern. Elsewhere there is mild small vessel disease in the centra semiovale bilaterally. Vascular: No hyperdense vessel. There is calcification in each carotid siphon region. Skull: The bony calvarium appears intact. Sinuses/Orbits: There is mucosal thickening in each maxillary antrum. There is a retention cyst in the inferior right maxillary antrum. There is opacification in multiple ethmoid air cells. There is mucosal thickening in the anterior left sphenoid sinus. Orbits appear symmetric bilaterally. Other: Mastoid air cells are clear. There is debris in the right external auditory canal. IMPRESSION: Mild diffuse atrophy, slightly increased from prior study. Suspect recent and potentially acute infarct in the posterior mid right cerebellum. There is decreased attenuation in the dentate nucleus of the right cerebellum compared to the left side which may represent a second nearby focus of recent and possibly acute infarct. Elsewhere there is mild periventricular small vessel disease. No mass or hemorrhage. There are foci of arterial vascular calcification. There is multifocal paranasal sinus disease. There is probable cerumen in the right external auditory canal. Electronically Signed   By: Lowella Grip III M.D.   On: 11/14/2019 15:16   CT Angio Neck W and/or Wo Contrast  Result Date: 11/15/2019 CLINICAL DATA:  Follow-up examination for stroke. EXAM: CT ANGIOGRAPHY HEAD AND NECK TECHNIQUE: Multidetector CT imaging of the head and neck was performed using the standard protocol during bolus administration of intravenous contrast. Multiplanar CT image reconstructions and MIPs were obtained to evaluate the vascular anatomy. Carotid stenosis measurements (when applicable) are obtained utilizing NASCET criteria, using the distal internal carotid diameter as the denominator. CONTRAST:  21mL OMNIPAQUE IOHEXOL 350 MG/ML SOLN COMPARISON:  Head CT from 11/14/2019. FINDINGS: CTA NECK FINDINGS  Aortic arch: Visualized aortic arch of normal caliber. Origin of the great vessels incompletely visualized on this  exam. Visualized subclavian arteries widely patent. Right carotid system: Right common carotid artery widely patent from its origin to the bifurcation. Eccentric plaque about the right bifurcation/proximal right ICA with associated stenosis of up to 60% by NASCET criteria. Right ICA otherwise patent distally to the skull base without stenosis, dissection or occlusion. Left carotid system: Visualized left CCA widely patent to the bifurcation without stenosis. Bulky calcified plaque about the left bifurcation/proximal left ICA with associated stenosis of up to 50% by NASCET criteria. Left ICA widely patent distally to the skull base without stenosis, dissection, or occlusion. Vertebral arteries: Both vertebral arteries arise from the subclavian arteries. Left vertebral artery dominant. Visualized vertebral arteries widely patent without stenosis, dissection, or occlusion. Right V2/V3 junction not well visualized due to streak artifact. Skeleton: No acute osseous finding. No discrete or worrisome osseous lesions. Moderate multilevel cervical spondylosis at C5-6 and C6-7. Basilar invagination with scattered osseous fragments about the dens noted, chronic in appearance. Other neck: No other acute soft tissue abnormality within the neck. No mass lesion or adenopathy. Upper chest: Visualized upper chest demonstrates no acute or significant finding. Review of the MIP images confirms the above findings CTA HEAD FINDINGS Anterior circulation: Petrous segments widely patent bilaterally. Calcified atheromatous plaque throughout the carotid siphons with associated moderate diffuse narrowing bilaterally. ICA termini well perfused. A1 segments patent bilaterally. Normal anterior communicating artery. Anterior cerebral arteries widely patent to their distal aspects. No M1 stenosis or occlusion. Normal MCA bifurcations.  Distal MCA branches well perfused and symmetric. Posterior circulation: Calcified atheromatous plaque within the dominant left V4 segment with associated moderate approximate 50% stenosis. Left V4 otherwise patent to the vertebrobasilar junction. Patent left PICA. Right vertebral patent as it courses into the cranial vault. Focal atheromatous plaque with resultant severe short-segment stenosis seen at the proximal right V4 segment (series 7, image 166). Right vertebral largely terminates in PICA, although a tiny branch ascending towards the vertebrobasilar junction. Right PICA patent as well. Basilar diminutive but patent to its distal aspect without stenosis. Superior cerebral arteries patent bilaterally. Both PCA supplied via hypoplastic P1 segments and robust posterior communicating arteries. Both PCAs widely patent to their distal aspects. Venous sinuses: Grossly patent allowing for timing the contrast bolus. Anatomic variants: None significant.  No intracranial aneurysm. Review of the MIP images confirms the above findings IMPRESSION: 1. Negative CTA for large vessel occlusion. 2. Atheromatous plaque about the carotid bifurcations with associated stenoses of up to 60% on the right and 50% on the left. 3. Atherosclerotic change throughout the carotid siphons with associated moderate diffuse narrowing. 4. Atheromatous plaque involving the V4 segments bilaterally with associated stenoses, moderate on the left, severe on the right. Left vertebral artery is dominant. Electronically Signed   By: Jeannine Boga M.D.   On: 11/15/2019 00:44   MR BRAIN WO CONTRAST  Result Date: 11/15/2019 CLINICAL DATA:  Initial evaluation for acute stroke. EXAM: MRI HEAD WITHOUT CONTRAST TECHNIQUE: Multiplanar, multiecho pulse sequences of the brain and surrounding structures were obtained without intravenous contrast. COMPARISON:  Prior CT and CTA from earlier the same day. FINDINGS: Brain: Generalized age-related cerebral  atrophy. Small area of encephalomalacia and gliosis involving the left parietal lobe compatible with a chronic posterior left MCA territory infarct. Associated chronic hemosiderin staining noted. Additional small chronic right cerebellar infarct noted as well. Confluent area of restricted diffusion measuring approximately 3.8 cm in diameter seen involving the cortical and subcortical right frontal operculum, compatible with acute right MCA territory infarct (series 5,  image 78). Patchy involvement of the underlying insula noted. No associated hemorrhage or mass effect. Additional small area of restricted diffusion measuring 2.8 cm seen involving the parasagittal left occipital lobe, compatible with a left PCA territory infarct. Associated mild petechial hemorrhage evident on corresponding SWI sequence. No associated mass effect. This area of ischemia is more early subacute in appearance. Otherwise, gray-white matter differentiation maintained without evidence for acute or subacute ischemia. No acute or chronic intracranial hemorrhage elsewhere within the brain. No mass lesion, midline shift or mass effect. No hydrocephalus. No extra-axial fluid collection. Pituitary gland suprasellar region normal. Midline structures intact. Vascular: Major intracranial vascular flow voids are maintained. Skull and upper cervical spine: Basilar invagination noted at the craniocervical junction. Associated degenerative pannus formation with scattered osseous fragments noted about the dens. No more than mild stenosis at the cervicomedullary junction. Bone marrow signal intensity within normal limits. No scalp soft tissue abnormality. Sinuses/Orbits: Patient status post bilateral ocular lens replacement. Scattered mucosal thickening noted throughout the paranasal sinuses. Superimposed small retention cyst noted within the right maxillary sinus. No mastoid effusion. Inner ear structures normal. Other: None. IMPRESSION: 1. 3.8 cm acute  ischemic nonhemorrhagic right MCA territory infarct involving the right frontal operculum. No associated mass effect. 2. Additional 2.8 cm early subacute left PCA territory infarct involving the parasagittal left occipital lobe. Associated mild petechial hemorrhage without mass effect. 3. Underlying age-related cerebral atrophy with chronic left parietal and right cerebellar infarcts. Electronically Signed   By: Jeannine Boga M.D.   On: 11/15/2019 02:24    Scheduled Meds: .  stroke: mapping our early stages of recovery book   Does not apply Once  . alfuzosin  10 mg Oral Q breakfast  . allopurinol  100 mg Oral Daily  . vitamin C  1,000 mg Oral Daily  . aspirin EC  325 mg Oral Daily  . insulin aspart  0-15 Units Subcutaneous TID WC  . insulin aspart  0-5 Units Subcutaneous QHS  . insulin glargine  12 Units Subcutaneous QHS  . insulin glargine  14 Units Subcutaneous Daily  . magnesium oxide  400 mg Oral Daily  . metoprolol succinate  25 mg Oral Daily  . omega-3 acid ethyl esters  1 g Oral Daily  . pravastatin  20 mg Oral q1800  . vitamin B-12  5,000 mcg Oral Daily  . zinc sulfate  220 mg Oral Daily   Continuous Infusions:  Principal Problem:   Stroke [facility]) Active Problems:   Type 1 diabetes mellitus (HCC)   PAF (paroxysmal atrial fibrillation) ([place])   Essential hypertension    Time spent: 45 minutes    [facility] If 7PM-7AM, please contact night-coverage at [email or web address], password [facility] 11/15/2019, 2:11 PM  LOS: 0 days

## 2019-11-15 NOTE — ED Notes (Signed)
Assisted pt to restroom without difficult. Ambulated well

## 2019-11-15 NOTE — Patient Outreach (Signed)
  [facility]) Care Management Chronic Special Needs Program    11/15/2019  Name: Curtis Dunn, DOB: 1941-08-12  MRN: [id number]  Transition of care note:  Mr. [name] is enrolled in a chronic special needs plan for Diabetes. Mr. [name] presented to the [facility] Emergency Department on 11/14/19 c/o symptoms of numbness in his first three fingers of his left hand and inability to  pick up things or button his shirt. He has history of paroxysmal atrial fibrillation and on Xarelto. CT of head showed acute stroke posterior mid right cerebellum. Individualized care plan sent to [facility] for admission.  Kelli Churn RN, CCM, [place] Management Coordinator [facility] Care Management [phone]

## 2019-11-15 NOTE — ED Notes (Signed)
Lunch Tray Ordered @ 1033. 

## 2019-11-15 NOTE — Progress Notes (Signed)
  Echocardiogram 2D Echocardiogram has been performed.  Curtis Dunn 11/15/2019, 3:26 PM

## 2019-11-15 NOTE — ED Notes (Signed)
Patient transported to MRI 

## 2019-11-15 NOTE — ED Notes (Addendum)
ED TO INPATIENT HANDOFF REPORT  ED Nurse Name and Phone #:  [phone]  S Name/Age/Gender Curtis Dunn 78 y.o. male Room/Bed: 017C/017C  Code Status   Code Status: Full Code  Home/SNF/Other Home Patient oriented to: self, place, time and situation Is this baseline? Yes   Triage Complete: Triage complete  Chief Complaint Acute CVA (cerebrovascular accident) [facility]) [I63.9]  Triage Note Pt reports at 0630 this morning he felt numbness in his first three fingers of his left hand, no other symptoms. Pt states he cannot pick up things or button up his shirt like he normally can, no facial droop or unilateral weakness noted in triage. Pt taken to bridge at 1424 for Dr Jeanell Sparrow to assess. Per dr Jeanell Sparrow pt is not a Code Stroke. Pt taken back to triage to obtain bloodwork and EKG. Pt a.o    Allergies No Known Allergies  Level of Care/Admitting Diagnosis ED Disposition    ED Disposition Condition Comment   Admit  Hospital Area: [facility] [[id number]]  Level of Care: Telemetry Medical [104]  I expect the patient will be discharged within 24 hours: No (not a candidate for 5C-Observation unit)  Covid Evaluation: Asymptomatic Screening Protocol (No Symptoms)  Diagnosis: Acute CVA (cerebrovascular accident) [facility]) [[id number]]  Admitting Physician: Rise Patience [phone]  Attending Physician: Rise Patience [email or web address]       B Medical/Surgery History Past Medical History:  Diagnosis Date  . Broken neck ([place])    in March 2015  . Cataract   . Colon cancer ([place])   . Colon polyp   . Diabetes mellitus    Type 1  . Dysrhythmia    a fib  . Gout   . Hyperlipidemia   . Hypertension   . Lipoma of back   . Paroxysmal atrial fibrillation [facility])    Past Surgical History:  Procedure Laterality Date  . ANKLE SURGERY     right  . APPENDECTOMY  2001  . arm surgery     right shoulder surgery  . CATARACT EXTRACTION EXTRACAPSULAR Bilateral   . COLON RESECTION    .  ELBOW FRACTURE SURGERY    . INGUINAL HERNIA REPAIR Left 03/13/2016   Procedure: LEFT INGUINAL HERNIA REPAIR WITH UNBILICAL HERNIA REPAIR;  Surgeon: [name], MD;  Location: [place];  Service: General;  Laterality: Left;  . INSERTION OF MESH Left 03/13/2016   Procedure: INSERTION OF MESH;  Surgeon: [name], MD;  Location: [place];  Service: General;  Laterality: Left;  [name] MASS EXCISION N/A 07/20/2019   Procedure: EXCISION OF BACK MASS;  Surgeon: Rolm Bookbinder, MD;  Location: [place];  Service: General;  Laterality: N/A;  MAC AND LOCAL  . VASECTOMY       A IV Location/Drains/Wounds Patient Lines/Drains/Airways Status   Active Line/Drains/Airways    Name:   Placement date:   Placement time:   Site:   Days:   Peripheral IV 11/14/19 Right Wrist   11/14/19    1928    Wrist   1   Peripheral IV 11/14/19 Left Antecubital   11/14/19    2338    Antecubital   1   Incision (Closed) 03/13/16 Groin Left   03/13/16    1052     1342   Incision (Closed) 73/22/02 Umbilicus Mid   54/27/06    1130     1342   Incision (Closed) 07/20/19 Back Left   07/20/19    0846  118   Wound / Incision (Open or Dehisced) 02/10/14 Laceration Head Anterior   02/10/14    0320    Head   2104   Wound / Incision (Open or Dehisced) 02/13/14 Laceration Other (Comment) Right tongue ulcer, right side, posterior   02/13/14    1000    Other (Comment)   2101          Intake/Output Last 24 hours No intake or output data in the 24 hours ending 11/15/19 0045  Labs/Imaging Results for orders placed or performed during the hospital encounter of 11/14/19 (from the past 48 hour(s))  CBG monitoring, ED     Status: Abnormal   Collection Time: 11/14/19  2:25 PM  Result Value Ref Range   Glucose-Capillary 150 (H) 70 - 99 mg/dL  Protime-INR     Status: None   Collection Time: 11/14/19  2:37 PM  Result Value Ref Range   Prothrombin Time 13.3 11.4 - 15.2 seconds   INR 1.0 0.8 - 1.2    Comment: (NOTE) INR goal  varies based on device and disease states. Performed at [facility], [place] [address], [place], [place] [address]   APTT     Status: None   Collection Time: 11/14/19  2:37 PM  Result Value Ref Range   aPTT 27 24 - 36 seconds    Comment: Performed at [facility] [address] [place], [place] [address]  CBC     Status: Abnormal   Collection Time: 11/14/19  2:37 PM  Result Value Ref Range   WBC 7.1 4.0 - 10.5 K/uL   RBC 4.06 (L) 4.22 - 5.81 MIL/uL   Hemoglobin 13.0 13.0 - 17.0 g/dL   HCT 39.4 39.0 - 52.0 %   MCV 97.0 80.0 - 100.0 fL   MCH 32.0 26.0 - 34.0 pg   MCHC 33.0 30.0 - 36.0 g/dL   RDW 13.0 11.5 - 15.5 %   Platelets 188 150 - 400 K/uL   nRBC 0.0 0.0 - 0.2 %    Comment: Performed at [facility], [place] [address] [place], [place] [address]  Differential     Status: None   Collection Time: 11/14/19  2:37 PM  Result Value Ref Range   Neutrophils Relative % 73 %   Neutro Abs 5.2 1.7 - 7.7 K/uL   Lymphocytes Relative 14 %   Lymphs Abs 1.0 0.7 - 4.0 K/uL   Monocytes Relative 9 %   Monocytes Absolute 0.6 0.1 - 1.0 K/uL   Eosinophils Relative 3 %   Eosinophils Absolute 0.2 0.0 - 0.5 K/uL   Basophils Relative 1 %   Basophils Absolute 0.1 0.0 - 0.1 K/uL   Immature Granulocytes 0 %   Abs Immature Granulocytes 0.01 0.00 - 0.07 K/uL    Comment: Performed at [facility] [address] [place], [place] [address]  Comprehensive metabolic panel     Status: Abnormal   Collection Time: 11/14/19  2:37 PM  Result Value Ref Range   Sodium 140 135 - 145 mmol/L   Potassium 4.2 3.5 - 5.1 mmol/L   Chloride 100 98 - 111 mmol/L   CO2 28 22 - 32 mmol/L   Glucose, Bld 180 (H) 70 - 99 mg/dL   BUN 26 (H) 8 - 23 mg/dL   Creatinine, Ser 1.36 (H) 0.61 - 1.24 mg/dL   Calcium 9.8 8.9 - 10.3 mg/dL   Total Protein 6.5 6.5 - 8.1 g/dL  Albumin 4.1 3.5 - 5.0 g/dL   AST 30 15 - 41 U/L   ALT 20 0 - 44 U/L   Alkaline Phosphatase 62 38 - 126 U/L   Total Bilirubin 0.8 0.3 -  1.2 mg/dL   GFR calc non Af Amer 49 (L) >60 mL/min   GFR calc Af Amer 57 (L) >60 mL/min   Anion gap 12 5 - 15    Comment: Performed at [facility] [address] [place], [place] [address]  Ethanol     Status: None   Collection Time: 11/14/19  7:51 PM  Result Value Ref Range   Alcohol, Ethyl (B) <10 <10 mg/dL    Comment: (NOTE) Lowest detectable limit for serum alcohol is 10 mg/dL. For medical purposes only. Performed at [facility], [place] [address], [place], [place] [address]    CT HEAD WO CONTRAST  Result Date: 11/14/2019 CLINICAL DATA:  Left hand numbness EXAM: CT HEAD WITHOUT CONTRAST TECHNIQUE: Contiguous axial images were obtained from the base of the skull through the vertex without intravenous contrast. COMPARISON:  December 25, 2015 FINDINGS: Brain: Mild diffuse atrophy slightly increased from prior study. There is no intracranial mass, hemorrhage, extra-axial fluid collection, or midline shift. There is a focal infarct in the posterior mid right cerebellum. There is also asymmetric decreased attenuation in the dentate nucleus on the right compared to the left. Recent infarcts in these areas of the right cerebellum are of concern. Elsewhere there is mild small vessel disease in the centra semiovale bilaterally. Vascular: No hyperdense vessel. There is calcification in each carotid siphon region. Skull: The bony calvarium appears intact. Sinuses/Orbits: There is mucosal thickening in each maxillary antrum. There is a retention cyst in the inferior right maxillary antrum. There is opacification in multiple ethmoid air cells. There is mucosal thickening in the anterior left sphenoid sinus. Orbits appear symmetric bilaterally. Other: Mastoid air cells are clear. There is debris in the right external auditory canal. IMPRESSION: Mild diffuse atrophy, slightly increased from prior study. Suspect recent and potentially acute infarct in the posterior mid right cerebellum. There is  decreased attenuation in the dentate nucleus of the right cerebellum compared to the left side which may represent a second nearby focus of recent and possibly acute infarct. Elsewhere there is mild periventricular small vessel disease. No mass or hemorrhage. There are foci of arterial vascular calcification. There is multifocal paranasal sinus disease. There is probable cerumen in the right external auditory canal. Electronically Signed   By: Lowella Grip III M.D.   On: 11/14/2019 15:16    Pending Labs Unresulted Labs (From admission, onward)    Start     Ordered   11/15/19 0500  Hemoglobin A1c  Tomorrow morning,   R     11/14/19 2225   11/15/19 0500  Lipid panel  Tomorrow morning,   R    Comments: Fasting    11/14/19 2225   11/14/19 1926  Urine rapid drug screen (hosp performed)  ONCE - STAT,   STAT     11/14/19 1925   11/14/19 1926  Urinalysis, Routine w reflex microscopic  ONCE - STAT,   STAT     11/14/19 1925   11/14/19 1921  SARS CORONAVIRUS 2 (TAT 6-24 HRS) Nasopharyngeal Nasopharyngeal Swab  (Tier 3 (TAT 6-24 hrs))  Once,   STAT    Question Answer Comment  Is this test for diagnosis or screening Screening   Symptomatic for COVID-19 as defined by CDC  No   Hospitalized for COVID-19 No   Admitted to ICU for COVID-19 No   Previously tested for COVID-19 Yes   Resident in a congregate (group) care setting No   Employed in healthcare setting No      11/14/19 1920          Vitals/Pain Today's Vitals   11/14/19 1900 11/14/19 1930 11/14/19 2030 11/14/19 2100  BP: (!) 146/63 (!) 157/74 (!) 141/60 131/67  Pulse: 61   (!) 55  Resp: _0 Temp:      TempSrc:      SpO2: 95%   95%  PainSc: 0-No pain   0-No pain    Isolation Precautions No active isolations  Medications Medications  allopurinol (ZYLOPRIM) tablet 100 mg (has no administration in time range)  amLODipine (NORVASC) tablet 5 mg (has no administration in time range)  hydrALAZINE (APRESOLINE) tablet 50  mg (has no administration in time range)  pravastatin (PRAVACHOL) tablet 20 mg (has no administration in time range)  metoprolol succinate (TOPROL-XL) 24 hr tablet 25 mg (has no administration in time range)  ramipril (ALTACE) capsule 10 mg (has no administration in time range)  insulin aspart (novoLOG) injection 10 Units (has no administration in time range)  insulin glargine (LANTUS) injection 14 Units (14 Units Subcutaneous Given 11/14/19 2327)  alfuzosin (UROXATRAL) 24 hr tablet 10 mg (has no administration in time range)  vitamin B-12 (CYANOCOBALAMIN) tablet 5,000 mcg (has no administration in time range)  Rivaroxaban (XARELTO) tablet 15 mg (15 mg Oral Given 11/14/19 2327)  ascorbic acid (VITAMIN C) tablet 1,000 mg (has no administration in time range)   stroke: mapping our early stages of recovery book (has no administration in time range)  acetaminophen (TYLENOL) tablet 650 mg (has no administration in time range)    Or  acetaminophen (TYLENOL) 160 MG/5ML solution 650 mg (has no administration in time range)    Or  acetaminophen (TYLENOL) suppository 650 mg (has no administration in time range)  insulin glargine (LANTUS) injection 12 Units (has no administration in time range)  magnesium oxide (MAG-OX) tablet 400 mg (has no administration in time range)  omega-3 acid ethyl esters (LOVAZA) capsule 1 g (has no administration in time range)  zinc sulfate capsule 220 mg (has no administration in time range)  iohexol (OMNIPAQUE) 350 MG/ML injection 75 mL (75 mLs Intravenous Contrast Given 11/15/19 0009)    Mobility walks Moderate fall risk   Focused Assessments Neuro Assessment Handoff:  Swallow screen pass? Yes  Cardiac Rhythm: Normal sinus rhythm NIH Stroke Scale ( + Modified Stroke Scale Criteria)  Interval: Initial Level of Consciousness (1a.)   : Alert, keenly responsive LOC Questions (1b. )   +: Answers both questions correctly LOC Commands (1c. )   + : Performs both tasks  correctly Best Gaze (2. )  +: Normal Visual (3. )  +: No visual loss Facial Palsy (4. )    : Normal symmetrical movements Motor Arm, Left (5a. )   +: No drift Motor Arm, Right (5b. )   +: No drift Motor Leg, Left (6a. )   +: No drift Motor Leg, Right (6b. )   +: No drift Limb Ataxia (7. ): Absent Sensory (8. )   +: Mild-to-moderate sensory loss, patient feels pinprick is less sharp or is dull on the affected side, or there is a loss of superficial pain with pinprick, but patient is aware of being touched Best Language (9. )   +:  No aphasia Dysarthria (10. ): Normal Extinction/Inattention (11.)   +: No Abnormality Modified SS Total  +: 1 Complete NIHSS TOTAL: 1     Neuro Assessment: Within Defined Limits Neuro Checks:   Initial (11/14/19 1827)  Last Documented NIHSS Modified Score: 1 (11/14/19 2337) Has TPA been given? No If patient is a Neuro Trauma and patient is going to OR before floor call report to [place] nurse: [phone] or [phone]     R Recommendations: See Admitting Provider Note  Report given to:   Additional Notes:  While pt is A&O x4, pt states off the wall things (i.e. "Margorie John will figure things out on the moon") and keeps asking what is going on - perhaps short term memory loss?  Stands in room - confused - asking for help.

## 2019-11-15 NOTE — ED Notes (Signed)
ED TO INPATIENT HANDOFF REPORT  ED Nurse Name and Phone #:  [phone]  S Name/Age/Gender Curtis Dunn 78 y.o. male Room/Bed: 007C/007C  Code Status   Code Status: Full Code  Home/SNF/Other Home Patient oriented to: self, place and time Is this baseline? Yes   Triage Complete: Triage complete  Chief Complaint Acute CVA (cerebrovascular accident) ([place]) [I63.9] Stroke [facility]) [I63.9]  Triage Note Pt reports at 7703 this morning he felt numbness in his first three fingers of his left hand, no other symptoms. Pt states he cannot pick up things or button up his shirt like he normally can, no facial droop or unilateral weakness noted in triage. Pt taken to bridge at 1424 for Dr Jeanell Sparrow to assess. Per dr Jeanell Sparrow pt is not a Code Stroke. Pt taken back to triage to obtain bloodwork and EKG. Pt a.o    Allergies No Known Allergies  Level of Care/Admitting Diagnosis ED Disposition    ED Disposition Condition [facility] Area: [place] [[id number]]  Level of Care: Telemetry Medical [104]  Covid Evaluation: Confirmed COVID Negative  Diagnosis: Stroke [facility]) [[id number]]  Admitting Physician: Darliss Cheney [[id number]]  Attending Physician: Darliss Cheney [phone]  Estimated length of stay: past midnight tomorrow  Certification:: I certify this patient will need inpatient services for at least 2 midnights       B Medical/Surgery History Past Medical History:  Diagnosis Date  . Broken neck ([place])    in March 2015  . Cataract   . Colon cancer ([place])   . Colon polyp   . Diabetes mellitus    Type 1  . Dysrhythmia    a fib  . Gout   . Hyperlipidemia   . Hypertension   . Lipoma of back   . Paroxysmal atrial fibrillation [facility])    Past Surgical History:  Procedure Laterality Date  . ANKLE SURGERY     right  . APPENDECTOMY  2001  . arm surgery     right shoulder surgery  . CATARACT EXTRACTION EXTRACAPSULAR Bilateral   . COLON RESECTION    . ELBOW  FRACTURE SURGERY    . INGUINAL HERNIA REPAIR Left 03/13/2016   Procedure: LEFT INGUINAL HERNIA REPAIR WITH UNBILICAL HERNIA REPAIR;  Surgeon: [name], MD;  Location: [place];  Service: General;  Laterality: Left;  . INSERTION OF MESH Left 03/13/2016   Procedure: INSERTION OF MESH;  Surgeon: [name], MD;  Location: [place];  Service: General;  Laterality: Left;  [name] MASS EXCISION N/A 07/20/2019   Procedure: EXCISION OF BACK MASS;  Surgeon: Rolm Bookbinder, MD;  Location: [place];  Service: General;  Laterality: N/A;  MAC AND LOCAL  . VASECTOMY       A IV Location/Drains/Wounds Patient Lines/Drains/Airways Status   Active Line/Drains/Airways    Name:   Placement date:   Placement time:   Site:   Days:   Peripheral IV 11/14/19 Right Wrist   11/14/19    1928    Wrist   1   Peripheral IV 11/14/19 Left Antecubital   11/14/19    2338    Antecubital   1   Incision (Closed) 03/13/16 Groin Left   03/13/16    1052     1342   Incision (Closed) 44/69/50 Umbilicus Mid   72/25/75    1130     1342   Incision (Closed) 07/20/19 Back Left   07/20/19    0846  118   Wound / Incision (Open or Dehisced) 02/10/14 Laceration Head Anterior   02/10/14    0320    Head   2104   Wound / Incision (Open or Dehisced) 02/13/14 Laceration Other (Comment) Right tongue ulcer, right side, posterior   02/13/14    1000    Other (Comment)   2101          Intake/Output Last 24 hours No intake or output data in the 24 hours ending 11/15/19 2106  Labs/Imaging Results for orders placed or performed during the hospital encounter of 11/14/19 (from the past 48 hour(s))  CBG monitoring, ED     Status: Abnormal   Collection Time: 11/14/19  2:25 PM  Result Value Ref Range   Glucose-Capillary 150 (H) 70 - 99 mg/dL  Protime-INR     Status: None   Collection Time: 11/14/19  2:37 PM  Result Value Ref Range   Prothrombin Time 13.3 11.4 - 15.2 seconds   INR 1.0 0.8 - 1.2    Comment: (NOTE) INR goal varies  based on device and disease states. Performed at [facility], [place] [address] [place], [place] [address]   APTT     Status: None   Collection Time: 11/14/19  2:37 PM  Result Value Ref Range   aPTT 27 24 - 36 seconds    Comment: Performed at [facility] [address], [place], [place] [address]  CBC     Status: Abnormal   Collection Time: 11/14/19  2:37 PM  Result Value Ref Range   WBC 7.1 4.0 - 10.5 K/uL   RBC 4.06 (L) 4.22 - 5.81 MIL/uL   Hemoglobin 13.0 13.0 - 17.0 g/dL   HCT 39.4 39.0 - 52.0 %   MCV 97.0 80.0 - 100.0 fL   MCH 32.0 26.0 - 34.0 pg   MCHC 33.0 30.0 - 36.0 g/dL   RDW 13.0 11.5 - 15.5 %   Platelets 188 150 - 400 K/uL   nRBC 0.0 0.0 - 0.2 %    Comment: Performed at [facility], [place] [address] [place], [place] [address]  Differential     Status: None   Collection Time: 11/14/19  2:37 PM  Result Value Ref Range   Neutrophils Relative % 73 %   Neutro Abs 5.2 1.7 - 7.7 K/uL   Lymphocytes Relative 14 %   Lymphs Abs 1.0 0.7 - 4.0 K/uL   Monocytes Relative 9 %   Monocytes Absolute 0.6 0.1 - 1.0 K/uL   Eosinophils Relative 3 %   Eosinophils Absolute 0.2 0.0 - 0.5 K/uL   Basophils Relative 1 %   Basophils Absolute 0.1 0.0 - 0.1 K/uL   Immature Granulocytes 0 %   Abs Immature Granulocytes 0.01 0.00 - 0.07 K/uL    Comment: Performed at [facility] [address] [place], [place] [address]  Comprehensive metabolic panel     Status: Abnormal   Collection Time: 11/14/19  2:37 PM  Result Value Ref Range   Sodium 140 135 - 145 mmol/L   Potassium 4.2 3.5 - 5.1 mmol/L   Chloride 100 98 - 111 mmol/L   CO2 28 22 - 32 mmol/L   Glucose, Bld 180 (H) 70 - 99 mg/dL   BUN 26 (H) 8 - 23 mg/dL   Creatinine, Ser 1.36 (H) 0.61 - 1.24 mg/dL   Calcium 9.8 8.9 - 10.3 mg/dL   Total Protein 6.5 6.5 - 8.1 g/dL  Albumin 4.1 3.5 - 5.0 g/dL   AST 30 15 - 41 U/L   ALT 20 0 - 44 U/L   Alkaline Phosphatase 62 38 - 126 U/L   Total Bilirubin 0.8 0.3 - 1.2  mg/dL   GFR calc non Af Amer 49 (L) >60 mL/min   GFR calc Af Amer 57 (L) >60 mL/min   Anion gap 12 5 - 15    Comment: Performed at [facility] [address], [place], [place] [address]  SARS CORONAVIRUS 2 (TAT 6-24 HRS) Nasopharyngeal Nasopharyngeal Swab     Status: None   Collection Time: 11/14/19  7:21 PM   Specimen: Nasopharyngeal Swab  Result Value Ref Range   SARS Coronavirus 2 NEGATIVE NEGATIVE    Comment: (NOTE) SARS-CoV-2 target nucleic acids are NOT DETECTED. The SARS-CoV-2 RNA is generally detectable in upper and lower respiratory specimens during the acute phase of infection. Negative results do not preclude SARS-CoV-2 infection, do not rule out co-infections with other pathogens, and should not be used as the sole basis for treatment or other patient management decisions. Negative results must be combined with clinical observations, patient history, and epidemiological information. The expected result is Negative. Fact Sheet for Patients: SugarRoll.be Fact Sheet for Healthcare Providers: https://www.woods-mathews.com/ This test is not yet approved or cleared by the [place] FDA and  has been authorized for detection and/or diagnosis of SARS-CoV-2 by FDA under an Emergency Use Authorization (EUA). This EUA will remain  in effect (meaning this test can be used) for the duration of the COVID-19 declaration under Section 56 4(b)(1) of the Act, 21 U.S.C. section 360bbb-3(b)(1), unless the authorization is terminated or revoked sooner. Performed at [facility], [place] [address], [place], [place] [address]   Ethanol     Status: None   Collection Time: 11/14/19  7:51 PM  Result Value Ref Range   Alcohol, Ethyl (B) <10 <10 mg/dL    Comment: (NOTE) Lowest detectable limit for serum alcohol is 10 mg/dL. For medical purposes only. Performed at [facility], [place] [address] [place], [place] [address]   Urine  rapid drug screen (hosp performed)     Status: None   Collection Time: 11/15/19  2:43 AM  Result Value Ref Range   Opiates NONE DETECTED NONE DETECTED   Cocaine NONE DETECTED NONE DETECTED   Benzodiazepines NONE DETECTED NONE DETECTED   Amphetamines NONE DETECTED NONE DETECTED   Tetrahydrocannabinol NONE DETECTED NONE DETECTED   Barbiturates NONE DETECTED NONE DETECTED    Comment: (NOTE) DRUG SCREEN FOR MEDICAL PURPOSES ONLY.  IF CONFIRMATION IS NEEDED FOR ANY PURPOSE, NOTIFY LAB WITHIN 5 DAYS. LOWEST DETECTABLE LIMITS FOR URINE DRUG SCREEN Drug Class                     Cutoff (ng/mL) Amphetamine and metabolites    1000 Barbiturate and metabolites    200 Benzodiazepine                 956 Tricyclics and metabolites     300 Opiates and metabolites        300 Cocaine and metabolites        300 THC                            50 Performed at [facility], [place] [address], [place], [place] [address]   Urinalysis, Routine w reflex microscopic  Status: Abnormal   Collection Time: 11/15/19  2:43 AM  Result Value Ref Range   Color, Urine YELLOW YELLOW   APPearance CLEAR CLEAR   Specific Gravity, Urine >1.046 (H) 1.005 - 1.030   pH 6.0 5.0 - 8.0   Glucose, UA >=500 (A) NEGATIVE mg/dL   Hgb urine dipstick NEGATIVE NEGATIVE   Bilirubin Urine NEGATIVE NEGATIVE   Ketones, ur 20 (A) NEGATIVE mg/dL   Protein, ur NEGATIVE NEGATIVE mg/dL   Nitrite NEGATIVE NEGATIVE   Leukocytes,Ua NEGATIVE NEGATIVE   RBC / HPF 0-5 0 - 5 RBC/hpf   WBC, UA 0-5 0 - 5 WBC/hpf   Bacteria, UA NONE SEEN NONE SEEN    Comment: Performed at [facility] [address], [place], [place] [address]  Hemoglobin A1c     Status: Abnormal   Collection Time: 11/15/19  5:00 AM  Result Value Ref Range   Hgb A1c MFr Bld 6.4 (H) 4.8 - 5.6 %    Comment: (NOTE) Pre diabetes:          5.7%-6.4% Diabetes:              >6.4% Glycemic control for   <7.0% adults with diabetes    Mean Plasma Glucose 136.98  mg/dL    Comment: Performed at [facility] [address], [place], [place] [address]  Lipid panel     Status: None   Collection Time: 11/15/19  5:00 AM  Result Value Ref Range   Cholesterol 126 0 - 200 mg/dL   Triglycerides 62 <150 mg/dL   HDL 46 >40 mg/dL   Total CHOL/HDL Ratio 2.7 RATIO   VLDL 12 0 - 40 mg/dL   LDL Cholesterol 68 0 - 99 mg/dL    Comment:        Total Cholesterol/HDL:CHD Risk Coronary Heart Disease Risk Table                     Men   Women  1/2 Average Risk   3.4   3.3  Average Risk       5.0   4.4  2 X Average Risk   9.6   7.1  3 X Average Risk  23.4   11.0        Use the calculated Patient Ratio above and the CHD Risk Table to determine the patient's CHD Risk.        ATP III CLASSIFICATION (LDL):  <100     mg/dL   Optimal  100-129  mg/dL   Near or Above                    Optimal  130-159  mg/dL   Borderline  160-189  mg/dL   High  >190     mg/dL   Very High Performed at [facility] [address], [place], [place] [address]   CBG monitoring, ED     Status: Abnormal   Collection Time: 11/15/19 10:14 AM  Result Value Ref Range   Glucose-Capillary 353 (H) 70 - 99 mg/dL   Comment 1 Notify RN    Comment 2 Document in Chart   CBG monitoring, ED     Status: Abnormal   Collection Time: 11/15/19 12:09 PM  Result Value Ref Range   Glucose-Capillary 341 (H) 70 - 99 mg/dL  CBG monitoring, ED     Status: Abnormal   Collection Time: 11/15/19  5:04 PM  Result Value Ref Range  Glucose-Capillary 263 (H) 70 - 99 mg/dL   CT Angio Head W or Wo Contrast  Result Date: 11/15/2019 CLINICAL DATA:  Follow-up examination for stroke. EXAM: CT ANGIOGRAPHY HEAD AND NECK TECHNIQUE: Multidetector CT imaging of the head and neck was performed using the standard protocol during bolus administration of intravenous contrast. Multiplanar CT image reconstructions and MIPs were obtained to evaluate the vascular anatomy. Carotid stenosis measurements (when  applicable) are obtained utilizing NASCET criteria, using the distal internal carotid diameter as the denominator. CONTRAST:  56m OMNIPAQUE IOHEXOL 350 MG/ML SOLN COMPARISON:  Head CT from 11/14/2019. FINDINGS: CTA NECK FINDINGS Aortic arch: Visualized aortic arch of normal caliber. Origin of the great vessels incompletely visualized on this exam. Visualized subclavian arteries widely patent. Right carotid system: Right common carotid artery widely patent from its origin to the bifurcation. Eccentric plaque about the right bifurcation/proximal right ICA with associated stenosis of up to 60% by NASCET criteria. Right ICA otherwise patent distally to the skull base without stenosis, dissection or occlusion. Left carotid system: Visualized left CCA widely patent to the bifurcation without stenosis. Bulky calcified plaque about the left bifurcation/proximal left ICA with associated stenosis of up to 50% by NASCET criteria. Left ICA widely patent distally to the skull base without stenosis, dissection, or occlusion. Vertebral arteries: Both vertebral arteries arise from the subclavian arteries. Left vertebral artery dominant. Visualized vertebral arteries widely patent without stenosis, dissection, or occlusion. Right V2/V3 junction not well visualized due to streak artifact. Skeleton: No acute osseous finding. No discrete or worrisome osseous lesions. Moderate multilevel cervical spondylosis at C5-6 and C6-7. Basilar invagination with scattered osseous fragments about the dens noted, chronic in appearance. Other neck: No other acute soft tissue abnormality within the neck. No mass lesion or adenopathy. Upper chest: Visualized upper chest demonstrates no acute or significant finding. Review of the MIP images confirms the above findings CTA HEAD FINDINGS Anterior circulation: Petrous segments widely patent bilaterally. Calcified atheromatous plaque throughout the carotid siphons with associated moderate diffuse narrowing  bilaterally. ICA termini well perfused. A1 segments patent bilaterally. Normal anterior communicating artery. Anterior cerebral arteries widely patent to their distal aspects. No M1 stenosis or occlusion. Normal MCA bifurcations. Distal MCA branches well perfused and symmetric. Posterior circulation: Calcified atheromatous plaque within the dominant left V4 segment with associated moderate approximate 50% stenosis. Left V4 otherwise patent to the vertebrobasilar junction. Patent left PICA. Right vertebral patent as it courses into the cranial vault. Focal atheromatous plaque with resultant severe short-segment stenosis seen at the proximal right V4 segment (series 7, image 166). Right vertebral largely terminates in PICA, although a tiny branch ascending towards the vertebrobasilar junction. Right PICA patent as well. Basilar diminutive but patent to its distal aspect without stenosis. Superior cerebral arteries patent bilaterally. Both PCA supplied via hypoplastic P1 segments and robust posterior communicating arteries. Both PCAs widely patent to their distal aspects. Venous sinuses: Grossly patent allowing for timing the contrast bolus. Anatomic variants: None significant.  No intracranial aneurysm. Review of the MIP images confirms the above findings IMPRESSION: 1. Negative CTA for large vessel occlusion. 2. Atheromatous plaque about the carotid bifurcations with associated stenoses of up to 60% on the right and 50% on the left. 3. Atherosclerotic change throughout the carotid siphons with associated moderate diffuse narrowing. 4. Atheromatous plaque involving the V4 segments bilaterally with associated stenoses, moderate on the left, severe on the right. Left vertebral artery is dominant. Electronically Signed   By: BJeannine BogaM.D.   On:  11/15/2019 00:44   CT HEAD WO CONTRAST  Result Date: 11/14/2019 CLINICAL DATA:  Left hand numbness EXAM: CT HEAD WITHOUT CONTRAST TECHNIQUE: Contiguous axial  images were obtained from the base of the skull through the vertex without intravenous contrast. COMPARISON:  December 25, 2015 FINDINGS: Brain: Mild diffuse atrophy slightly increased from prior study. There is no intracranial mass, hemorrhage, extra-axial fluid collection, or midline shift. There is a focal infarct in the posterior mid right cerebellum. There is also asymmetric decreased attenuation in the dentate nucleus on the right compared to the left. Recent infarcts in these areas of the right cerebellum are of concern. Elsewhere there is mild small vessel disease in the centra semiovale bilaterally. Vascular: No hyperdense vessel. There is calcification in each carotid siphon region. Skull: The bony calvarium appears intact. Sinuses/Orbits: There is mucosal thickening in each maxillary antrum. There is a retention cyst in the inferior right maxillary antrum. There is opacification in multiple ethmoid air cells. There is mucosal thickening in the anterior left sphenoid sinus. Orbits appear symmetric bilaterally. Other: Mastoid air cells are clear. There is debris in the right external auditory canal. IMPRESSION: Mild diffuse atrophy, slightly increased from prior study. Suspect recent and potentially acute infarct in the posterior mid right cerebellum. There is decreased attenuation in the dentate nucleus of the right cerebellum compared to the left side which may represent a second nearby focus of recent and possibly acute infarct. Elsewhere there is mild periventricular small vessel disease. No mass or hemorrhage. There are foci of arterial vascular calcification. There is multifocal paranasal sinus disease. There is probable cerumen in the right external auditory canal. Electronically Signed   By: Lowella Grip III M.D.   On: 11/14/2019 15:16   CT Angio Neck W and/or Wo Contrast  Result Date: 11/15/2019 CLINICAL DATA:  Follow-up examination for stroke. EXAM: CT ANGIOGRAPHY HEAD AND NECK TECHNIQUE:  Multidetector CT imaging of the head and neck was performed using the standard protocol during bolus administration of intravenous contrast. Multiplanar CT image reconstructions and MIPs were obtained to evaluate the vascular anatomy. Carotid stenosis measurements (when applicable) are obtained utilizing NASCET criteria, using the distal internal carotid diameter as the denominator. CONTRAST:  30m OMNIPAQUE IOHEXOL 350 MG/ML SOLN COMPARISON:  Head CT from 11/14/2019. FINDINGS: CTA NECK FINDINGS Aortic arch: Visualized aortic arch of normal caliber. Origin of the great vessels incompletely visualized on this exam. Visualized subclavian arteries widely patent. Right carotid system: Right common carotid artery widely patent from its origin to the bifurcation. Eccentric plaque about the right bifurcation/proximal right ICA with associated stenosis of up to 60% by NASCET criteria. Right ICA otherwise patent distally to the skull base without stenosis, dissection or occlusion. Left carotid system: Visualized left CCA widely patent to the bifurcation without stenosis. Bulky calcified plaque about the left bifurcation/proximal left ICA with associated stenosis of up to 50% by NASCET criteria. Left ICA widely patent distally to the skull base without stenosis, dissection, or occlusion. Vertebral arteries: Both vertebral arteries arise from the subclavian arteries. Left vertebral artery dominant. Visualized vertebral arteries widely patent without stenosis, dissection, or occlusion. Right V2/V3 junction not well visualized due to streak artifact. Skeleton: No acute osseous finding. No discrete or worrisome osseous lesions. Moderate multilevel cervical spondylosis at C5-6 and C6-7. Basilar invagination with scattered osseous fragments about the dens noted, chronic in appearance. Other neck: No other acute soft tissue abnormality within the neck. No mass lesion or adenopathy. Upper chest: Visualized upper chest demonstrates no  acute or significant finding. Review of the MIP images confirms the above findings CTA HEAD FINDINGS Anterior circulation: Petrous segments widely patent bilaterally. Calcified atheromatous plaque throughout the carotid siphons with associated moderate diffuse narrowing bilaterally. ICA termini well perfused. A1 segments patent bilaterally. Normal anterior communicating artery. Anterior cerebral arteries widely patent to their distal aspects. No M1 stenosis or occlusion. Normal MCA bifurcations. Distal MCA branches well perfused and symmetric. Posterior circulation: Calcified atheromatous plaque within the dominant left V4 segment with associated moderate approximate 50% stenosis. Left V4 otherwise patent to the vertebrobasilar junction. Patent left PICA. Right vertebral patent as it courses into the cranial vault. Focal atheromatous plaque with resultant severe short-segment stenosis seen at the proximal right V4 segment (series 7, image 166). Right vertebral largely terminates in PICA, although a tiny branch ascending towards the vertebrobasilar junction. Right PICA patent as well. Basilar diminutive but patent to its distal aspect without stenosis. Superior cerebral arteries patent bilaterally. Both PCA supplied via hypoplastic P1 segments and robust posterior communicating arteries. Both PCAs widely patent to their distal aspects. Venous sinuses: Grossly patent allowing for timing the contrast bolus. Anatomic variants: None significant.  No intracranial aneurysm. Review of the MIP images confirms the above findings IMPRESSION: 1. Negative CTA for large vessel occlusion. 2. Atheromatous plaque about the carotid bifurcations with associated stenoses of up to 60% on the right and 50% on the left. 3. Atherosclerotic change throughout the carotid siphons with associated moderate diffuse narrowing. 4. Atheromatous plaque involving the V4 segments bilaterally with associated stenoses, moderate on the left, severe on  the right. Left vertebral artery is dominant. Electronically Signed   By: Jeannine Boga M.D.   On: 11/15/2019 00:44   MR BRAIN WO CONTRAST  Result Date: 11/15/2019 CLINICAL DATA:  Initial evaluation for acute stroke. EXAM: MRI HEAD WITHOUT CONTRAST TECHNIQUE: Multiplanar, multiecho pulse sequences of the brain and surrounding structures were obtained without intravenous contrast. COMPARISON:  Prior CT and CTA from earlier the same day. FINDINGS: Brain: Generalized age-related cerebral atrophy. Small area of encephalomalacia and gliosis involving the left parietal lobe compatible with a chronic posterior left MCA territory infarct. Associated chronic hemosiderin staining noted. Additional small chronic right cerebellar infarct noted as well. Confluent area of restricted diffusion measuring approximately 3.8 cm in diameter seen involving the cortical and subcortical right frontal operculum, compatible with acute right MCA territory infarct (series 5, image 78). Patchy involvement of the underlying insula noted. No associated hemorrhage or mass effect. Additional small area of restricted diffusion measuring 2.8 cm seen involving the parasagittal left occipital lobe, compatible with a left PCA territory infarct. Associated mild petechial hemorrhage evident on corresponding SWI sequence. No associated mass effect. This area of ischemia is more early subacute in appearance. Otherwise, gray-white matter differentiation maintained without evidence for acute or subacute ischemia. No acute or chronic intracranial hemorrhage elsewhere within the brain. No mass lesion, midline shift or mass effect. No hydrocephalus. No extra-axial fluid collection. Pituitary gland suprasellar region normal. Midline structures intact. Vascular: Major intracranial vascular flow voids are maintained. Skull and upper cervical spine: Basilar invagination noted at the craniocervical junction. Associated degenerative pannus formation with  scattered osseous fragments noted about the dens. No more than mild stenosis at the cervicomedullary junction. Bone marrow signal intensity within normal limits. No scalp soft tissue abnormality. Sinuses/Orbits: Patient status post bilateral ocular lens replacement. Scattered mucosal thickening noted throughout the paranasal sinuses. Superimposed small retention cyst noted within the right maxillary sinus. No mastoid effusion. [place]  ear structures normal. Other: None. IMPRESSION: 1. 3.8 cm acute ischemic nonhemorrhagic right MCA territory infarct involving the right frontal operculum. No associated mass effect. 2. Additional 2.8 cm early subacute left PCA territory infarct involving the parasagittal left occipital lobe. Associated mild petechial hemorrhage without mass effect. 3. Underlying age-related cerebral atrophy with chronic left parietal and right cerebellar infarcts. Electronically Signed   By: Jeannine Boga M.D.   On: 11/15/2019 02:24   ECHOCARDIOGRAM COMPLETE  Result Date: 11/15/2019   ECHOCARDIOGRAM REPORT   Patient Name:   ANEES VANECEK Date of Exam: 11/15/2019 Medical Rec #:  [id number]           Height:       71.0 in Accession #:    [id number]          Weight:       160.5 lb Date of Birth:  [birth year]          BSA:          1.92 m Patient Age:    11 years            BP:           138/49 mmHg Patient Gender: M                   HR:           71 bpm. Exam Location:  Inpatient Procedure: 2D Echo, Cardiac Doppler and Color Doppler Indications:    CVA  History:        Patient has prior history of Echocardiogram examinations, most                 recent 09/09/2012. Arrythmias:Atrial Fibrillation; Risk                 Factors:Hypertension and Diabetes. CKD.  Sonographer:    Dustin Flock Referring Phys: [place]  1. Left ventricular ejection fraction, by visual estimation, is 60 to 65%. The left ventricle has normal function. There is no left ventricular  hypertrophy.  2. The left ventricle has no regional wall motion abnormalities.  3. Global right ventricle has normal systolic function.The right ventricular size is normal. No increase in right ventricular wall thickness.  4. Left atrial size was normal.  5. Right atrial size was normal.  6. Trivial pericardial effusion is present.  7. Mild mitral annular calcification.  8. The mitral valve is normal in structure. Trivial mitral valve regurgitation. No evidence of mitral stenosis.  9. The tricuspid valve is normal in structure. 10. The aortic valve is normal in structure. Aortic valve regurgitation is trivial. No evidence of aortic valve sclerosis or stenosis. 11. The pulmonic valve was normal in structure. Pulmonic valve regurgitation is not visualized. 12. TR signal is inadequate for assessing pulmonary artery systolic pressure. 13. The inferior vena cava is normal in size with greater than 50% respiratory variability, suggesting right atrial pressure of 3 mmHg. FINDINGS  Left Ventricle: Left ventricular ejection fraction, by visual estimation, is 60 to 65%. The left ventricle has normal function. The left ventricle has no regional wall motion abnormalities. There is no left ventricular hypertrophy. Normal left atrial pressure. Right Ventricle: The right ventricular size is normal. No increase in right ventricular wall thickness. Global RV systolic function is has normal systolic function. Left Atrium: Left atrial size was normal in size. Right Atrium: Right atrial size was normal in size Pericardium: Trivial pericardial effusion is present. Mitral Valve: The mitral  valve is normal in structure. Mild mitral annular calcification. Trivial mitral valve regurgitation. No evidence of mitral valve stenosis by observation. Tricuspid Valve: The tricuspid valve is normal in structure. Tricuspid valve regurgitation is not demonstrated. Aortic Valve: The aortic valve is normal in structure.. There is mild thickening and  mild calcification of the aortic valve. Aortic valve regurgitation is trivial. The aortic valve is structurally normal, with no evidence of sclerosis or stenosis. There is mild thickening of the aortic valve. There is mild calcification of the aortic valve. Pulmonic Valve: The pulmonic valve was normal in structure. Pulmonic valve regurgitation is not visualized. Pulmonic regurgitation is not visualized. Aorta: The aortic root, ascending aorta and aortic arch are all structurally normal, with no evidence of dilitation or obstruction. Venous: The inferior vena cava was not well visualized. The inferior vena cava is normal in size with greater than 50% respiratory variability, suggesting right atrial pressure of 3 mmHg. IAS/Shunts: No atrial level shunt detected by color flow Doppler. There is no evidence of a patent foramen ovale. No ventricular septal defect is seen or detected. There is no evidence of an atrial septal defect.  Skeet Latch MD Electronically signed by Skeet Latch MD Signature Date/Time: 11/15/2019/5:34:26 PM    Final     Pending Labs Unresulted Labs (From admission, onward)    Start     Ordered   11/16/19 0500  CBC with Differential/Platelet  Tomorrow morning,   R     11/15/19 1511   11/16/19 2878  Basic metabolic panel  Tomorrow morning,   R     11/15/19 1511          Vitals/Pain Today's Vitals   11/15/19 2015 11/15/19 2030 11/15/19 2045 11/15/19 2051  BP: (!) 123/58 (!) 119/59 (!) 122/59   Pulse: (!) 57 (!) 56 (!) 49   Resp: (!) 23 (!) 21 17   Temp:      TempSrc:      SpO2: 97% 96% 96%   PainSc:    Asleep    Isolation Precautions No active isolations  Medications Medications  allopurinol (ZYLOPRIM) tablet 100 mg (100 mg Oral Given 11/15/19 1124)  pravastatin (PRAVACHOL) tablet 20 mg (20 mg Oral Given 11/15/19 1716)  metoprolol succinate (TOPROL-XL) 24 hr tablet 25 mg (25 mg Oral Not Given 11/15/19 1123)  alfuzosin (UROXATRAL) 24 hr tablet 10 mg (10 mg  Oral Given 11/15/19 0851)  vitamin B-12 (CYANOCOBALAMIN) tablet 5,000 mcg (5,000 mcg Oral Given 11/15/19 1127)  ascorbic acid (VITAMIN C) tablet 1,000 mg (1,000 mg Oral Given 11/15/19 1125)   stroke: mapping our early stages of recovery book (has no administration in time range)  acetaminophen (TYLENOL) tablet 650 mg (has no administration in time range)    Or  acetaminophen (TYLENOL) 160 MG/5ML solution 650 mg (has no administration in time range)    Or  acetaminophen (TYLENOL) suppository 650 mg (has no administration in time range)  magnesium oxide (MAG-OX) tablet 400 mg (400 mg Oral Given 11/15/19 1125)  omega-3 acid ethyl esters (LOVAZA) capsule 1 g (1 g Oral Given 11/15/19 1123)  zinc sulfate capsule 220 mg (220 mg Oral Given 11/15/19 1124)  insulin glargine (LANTUS) injection 14 Units (14 Units Subcutaneous Given 11/15/19 1116)  insulin glargine (LANTUS) injection 12 Units (has no administration in time range)  aspirin EC tablet 325 mg (325 mg Oral Given 11/15/19 1123)  insulin aspart (novoLOG) injection 0-15 Units (8 Units Subcutaneous Given 11/15/19 1716)  insulin aspart (novoLOG) injection 0-5 Units (  has no administration in time range)  hydrALAZINE (APRESOLINE) injection 10 mg (has no administration in time range)  iohexol (OMNIPAQUE) 350 MG/ML injection 75 mL (75 mLs Intravenous Contrast Given 11/15/19 0009)    Mobility walks High fall risk   Focused Assessments Neuro Assessment Handoff:  Swallow screen pass? Yes  Cardiac Rhythm: Sinus bradycardia NIH Stroke Scale ( + Modified Stroke Scale Criteria)  Interval: Initial Level of Consciousness (1a.)   : Alert, keenly responsive LOC Questions (1b. )   +: Answers both questions correctly LOC Commands (1c. )   + : Performs both tasks correctly Best Gaze (2. )  +: Normal Visual (3. )  +: No visual loss Facial Palsy (4. )    : Normal symmetrical movements Motor Arm, Left (5a. )   +: No drift Motor Arm, Right (5b. )   +: No  drift Motor Leg, Left (6a. )   +: No drift Motor Leg, Right (6b. )   +: No drift Limb Ataxia (7. ): Absent Sensory (8. )   +: Normal, no sensory loss Best Language (9. )   +: No aphasia Dysarthria (10. ): Normal Extinction/Inattention (11.)   +: No Abnormality Modified SS Total  +: 0 Complete NIHSS TOTAL: 1     Neuro Assessment: Within Defined Limits Neuro Checks:   Initial (11/14/19 1827)  Last Documented NIHSS Modified Score: 0 (11/15/19 1700) Has TPA been given? No If patient is a Neuro Trauma and patient is going to OR before floor call report to [facility] nurse: [phone] or [phone]     R Recommendations: See Admitting Provider Note  Report given to:   Additional Notes:  Pt lives at home by himself.

## 2019-11-15 NOTE — Progress Notes (Signed)
STROKE TEAM PROGRESS NOTE   INTERVAL HISTORY Pt lying in bed, stated that he felt much better, left hand weakness has resolved. No new complains. MRI showed left PCA and right MCA infarct. He is taking Xarelto 15mg  at home. Discussed with him, will switch to eliquis normal dose in a couple of days. He is in agreement.   Vitals:   11/15/19 0800 11/15/19 1100 11/15/19 1120 11/15/19 1215  BP: (!) 153/70  (!) 138/49   Pulse: (!) 54 (!) 55 (!) 56 (!) 56  Resp: 16 16 16 11   Temp:      TempSrc:      SpO2: 99% 100% 98% 97%    CBC:  Recent Labs  Lab 11/14/19 1437  WBC 7.1  NEUTROABS 5.2  HGB 13.0  HCT 39.4  MCV 97.0  PLT 0000000    Basic Metabolic Panel:  Recent Labs  Lab 11/14/19 1437  NA 140  K 4.2  CL 100  CO2 28  GLUCOSE 180*  BUN 26*  CREATININE 1.36*  CALCIUM 9.8   Lipid Panel:     Component Value Date/Time   CHOL 126 11/15/2019 0500   TRIG 62 11/15/2019 0500   HDL 46 11/15/2019 0500   CHOLHDL 2.7 11/15/2019 0500   VLDL 12 11/15/2019 0500   LDLCALC 68 11/15/2019 0500   HgbA1c:  Lab Results  Component Value Date   HGBA1C 6.4 (H) 11/15/2019   Urine Drug Screen:     Component Value Date/Time   LABOPIA NONE DETECTED 11/15/2019 0243   COCAINSCRNUR NONE DETECTED 11/15/2019 0243   LABBENZ NONE DETECTED 11/15/2019 0243   AMPHETMU NONE DETECTED 11/15/2019 0243   THCU NONE DETECTED 11/15/2019 0243   LABBARB NONE DETECTED 11/15/2019 0243    Alcohol Level     Component Value Date/Time   ETH <10 11/14/2019 1951    IMAGING CT Angio Head W or Wo Contrast  Result Date: 11/15/2019 CLINICAL DATA:  Follow-up examination for stroke. EXAM: CT ANGIOGRAPHY HEAD AND NECK TECHNIQUE: Multidetector CT imaging of the head and neck was performed using the standard protocol during bolus administration of intravenous contrast. Multiplanar CT image reconstructions and MIPs were obtained to evaluate the vascular anatomy. Carotid stenosis measurements (when applicable) are obtained  utilizing NASCET criteria, using the distal internal carotid diameter as the denominator. CONTRAST:  5mL OMNIPAQUE IOHEXOL 350 MG/ML SOLN COMPARISON:  Head CT from 11/14/2019. FINDINGS: CTA NECK FINDINGS Aortic arch: Visualized aortic arch of normal caliber. Origin of the great vessels incompletely visualized on this exam. Visualized subclavian arteries widely patent. Right carotid system: Right common carotid artery widely patent from its origin to the bifurcation. Eccentric plaque about the right bifurcation/proximal right ICA with associated stenosis of up to 60% by NASCET criteria. Right ICA otherwise patent distally to the skull base without stenosis, dissection or occlusion. Left carotid system: Visualized left CCA widely patent to the bifurcation without stenosis. Bulky calcified plaque about the left bifurcation/proximal left ICA with associated stenosis of up to 50% by NASCET criteria. Left ICA widely patent distally to the skull base without stenosis, dissection, or occlusion. Vertebral arteries: Both vertebral arteries arise from the subclavian arteries. Left vertebral artery dominant. Visualized vertebral arteries widely patent without stenosis, dissection, or occlusion. Right V2/V3 junction not well visualized due to streak artifact. Skeleton: No acute osseous finding. No discrete or worrisome osseous lesions. Moderate multilevel cervical spondylosis at C5-6 and C6-7. Basilar invagination with scattered osseous fragments about the dens noted, chronic in appearance. Other neck: No  other acute soft tissue abnormality within the neck. No mass lesion or adenopathy. Upper chest: Visualized upper chest demonstrates no acute or significant finding. Review of the MIP images confirms the above findings CTA HEAD FINDINGS Anterior circulation: Petrous segments widely patent bilaterally. Calcified atheromatous plaque throughout the carotid siphons with associated moderate diffuse narrowing bilaterally. ICA termini  well perfused. A1 segments patent bilaterally. Normal anterior communicating artery. Anterior cerebral arteries widely patent to their distal aspects. No M1 stenosis or occlusion. Normal MCA bifurcations. Distal MCA branches well perfused and symmetric. Posterior circulation: Calcified atheromatous plaque within the dominant left V4 segment with associated moderate approximate 50% stenosis. Left V4 otherwise patent to the vertebrobasilar junction. Patent left PICA. Right vertebral patent as it courses into the cranial vault. Focal atheromatous plaque with resultant severe short-segment stenosis seen at the proximal right V4 segment (series 7, image 166). Right vertebral largely terminates in PICA, although a tiny branch ascending towards the vertebrobasilar junction. Right PICA patent as well. Basilar diminutive but patent to its distal aspect without stenosis. Superior cerebral arteries patent bilaterally. Both PCA supplied via hypoplastic P1 segments and robust posterior communicating arteries. Both PCAs widely patent to their distal aspects. Venous sinuses: Grossly patent allowing for timing the contrast bolus. Anatomic variants: None significant.  No intracranial aneurysm. Review of the MIP images confirms the above findings IMPRESSION: 1. Negative CTA for large vessel occlusion. 2. Atheromatous plaque about the carotid bifurcations with associated stenoses of up to 60% on the right and 50% on the left. 3. Atherosclerotic change throughout the carotid siphons with associated moderate diffuse narrowing. 4. Atheromatous plaque involving the V4 segments bilaterally with associated stenoses, moderate on the left, severe on the right. Left vertebral artery is dominant. Electronically Signed   By: Jeannine Boga M.D.   On: 11/15/2019 00:44   CT HEAD WO CONTRAST  Result Date: 11/14/2019 CLINICAL DATA:  Left hand numbness EXAM: CT HEAD WITHOUT CONTRAST TECHNIQUE: Contiguous axial images were obtained from the  base of the skull through the vertex without intravenous contrast. COMPARISON:  December 25, 2015 FINDINGS: Brain: Mild diffuse atrophy slightly increased from prior study. There is no intracranial mass, hemorrhage, extra-axial fluid collection, or midline shift. There is a focal infarct in the posterior mid right cerebellum. There is also asymmetric decreased attenuation in the dentate nucleus on the right compared to the left. Recent infarcts in these areas of the right cerebellum are of concern. Elsewhere there is mild small vessel disease in the centra semiovale bilaterally. Vascular: No hyperdense vessel. There is calcification in each carotid siphon region. Skull: The bony calvarium appears intact. Sinuses/Orbits: There is mucosal thickening in each maxillary antrum. There is a retention cyst in the inferior right maxillary antrum. There is opacification in multiple ethmoid air cells. There is mucosal thickening in the anterior left sphenoid sinus. Orbits appear symmetric bilaterally. Other: Mastoid air cells are clear. There is debris in the right external auditory canal. IMPRESSION: Mild diffuse atrophy, slightly increased from prior study. Suspect recent and potentially acute infarct in the posterior mid right cerebellum. There is decreased attenuation in the dentate nucleus of the right cerebellum compared to the left side which may represent a second nearby focus of recent and possibly acute infarct. Elsewhere there is mild periventricular small vessel disease. No mass or hemorrhage. There are foci of arterial vascular calcification. There is multifocal paranasal sinus disease. There is probable cerumen in the right external auditory canal. Electronically Signed   By: Lowella Grip  III M.D.   On: 11/14/2019 15:16   CT Angio Neck W and/or Wo Contrast  Result Date: 11/15/2019 CLINICAL DATA:  Follow-up examination for stroke. EXAM: CT ANGIOGRAPHY HEAD AND NECK TECHNIQUE: Multidetector CT imaging of  the head and neck was performed using the standard protocol during bolus administration of intravenous contrast. Multiplanar CT image reconstructions and MIPs were obtained to evaluate the vascular anatomy. Carotid stenosis measurements (when applicable) are obtained utilizing NASCET criteria, using the distal internal carotid diameter as the denominator. CONTRAST:  66mL OMNIPAQUE IOHEXOL 350 MG/ML SOLN COMPARISON:  Head CT from 11/14/2019. FINDINGS: CTA NECK FINDINGS Aortic arch: Visualized aortic arch of normal caliber. Origin of the great vessels incompletely visualized on this exam. Visualized subclavian arteries widely patent. Right carotid system: Right common carotid artery widely patent from its origin to the bifurcation. Eccentric plaque about the right bifurcation/proximal right ICA with associated stenosis of up to 60% by NASCET criteria. Right ICA otherwise patent distally to the skull base without stenosis, dissection or occlusion. Left carotid system: Visualized left CCA widely patent to the bifurcation without stenosis. Bulky calcified plaque about the left bifurcation/proximal left ICA with associated stenosis of up to 50% by NASCET criteria. Left ICA widely patent distally to the skull base without stenosis, dissection, or occlusion. Vertebral arteries: Both vertebral arteries arise from the subclavian arteries. Left vertebral artery dominant. Visualized vertebral arteries widely patent without stenosis, dissection, or occlusion. Right V2/V3 junction not well visualized due to streak artifact. Skeleton: No acute osseous finding. No discrete or worrisome osseous lesions. Moderate multilevel cervical spondylosis at C5-6 and C6-7. Basilar invagination with scattered osseous fragments about the dens noted, chronic in appearance. Other neck: No other acute soft tissue abnormality within the neck. No mass lesion or adenopathy. Upper chest: Visualized upper chest demonstrates no acute or significant  finding. Review of the MIP images confirms the above findings CTA HEAD FINDINGS Anterior circulation: Petrous segments widely patent bilaterally. Calcified atheromatous plaque throughout the carotid siphons with associated moderate diffuse narrowing bilaterally. ICA termini well perfused. A1 segments patent bilaterally. Normal anterior communicating artery. Anterior cerebral arteries widely patent to their distal aspects. No M1 stenosis or occlusion. Normal MCA bifurcations. Distal MCA branches well perfused and symmetric. Posterior circulation: Calcified atheromatous plaque within the dominant left V4 segment with associated moderate approximate 50% stenosis. Left V4 otherwise patent to the vertebrobasilar junction. Patent left PICA. Right vertebral patent as it courses into the cranial vault. Focal atheromatous plaque with resultant severe short-segment stenosis seen at the proximal right V4 segment (series 7, image 166). Right vertebral largely terminates in PICA, although a tiny branch ascending towards the vertebrobasilar junction. Right PICA patent as well. Basilar diminutive but patent to its distal aspect without stenosis. Superior cerebral arteries patent bilaterally. Both PCA supplied via hypoplastic P1 segments and robust posterior communicating arteries. Both PCAs widely patent to their distal aspects. Venous sinuses: Grossly patent allowing for timing the contrast bolus. Anatomic variants: None significant.  No intracranial aneurysm. Review of the MIP images confirms the above findings IMPRESSION: 1. Negative CTA for large vessel occlusion. 2. Atheromatous plaque about the carotid bifurcations with associated stenoses of up to 60% on the right and 50% on the left. 3. Atherosclerotic change throughout the carotid siphons with associated moderate diffuse narrowing. 4. Atheromatous plaque involving the V4 segments bilaterally with associated stenoses, moderate on the left, severe on the right. Left  vertebral artery is dominant. Electronically Signed   By: Pincus Badder.D.  On: 11/15/2019 00:44   MR BRAIN WO CONTRAST  Result Date: 11/15/2019 CLINICAL DATA:  Initial evaluation for acute stroke. EXAM: MRI HEAD WITHOUT CONTRAST TECHNIQUE: Multiplanar, multiecho pulse sequences of the brain and surrounding structures were obtained without intravenous contrast. COMPARISON:  Prior CT and CTA from earlier the same day. FINDINGS: Brain: Generalized age-related cerebral atrophy. Small area of encephalomalacia and gliosis involving the left parietal lobe compatible with a chronic posterior left MCA territory infarct. Associated chronic hemosiderin staining noted. Additional small chronic right cerebellar infarct noted as well. Confluent area of restricted diffusion measuring approximately 3.8 cm in diameter seen involving the cortical and subcortical right frontal operculum, compatible with acute right MCA territory infarct (series 5, image 78). Patchy involvement of the underlying insula noted. No associated hemorrhage or mass effect. Additional small area of restricted diffusion measuring 2.8 cm seen involving the parasagittal left occipital lobe, compatible with a left PCA territory infarct. Associated mild petechial hemorrhage evident on corresponding SWI sequence. No associated mass effect. This area of ischemia is more early subacute in appearance. Otherwise, gray-white matter differentiation maintained without evidence for acute or subacute ischemia. No acute or chronic intracranial hemorrhage elsewhere within the brain. No mass lesion, midline shift or mass effect. No hydrocephalus. No extra-axial fluid collection. Pituitary gland suprasellar region normal. Midline structures intact. Vascular: Major intracranial vascular flow voids are maintained. Skull and upper cervical spine: Basilar invagination noted at the craniocervical junction. Associated degenerative pannus formation with scattered  osseous fragments noted about the dens. No more than mild stenosis at the cervicomedullary junction. Bone marrow signal intensity within normal limits. No scalp soft tissue abnormality. Sinuses/Orbits: Patient status post bilateral ocular lens replacement. Scattered mucosal thickening noted throughout the paranasal sinuses. Superimposed small retention cyst noted within the right maxillary sinus. No mastoid effusion. Inner ear structures normal. Other: None. IMPRESSION: 1. 3.8 cm acute ischemic nonhemorrhagic right MCA territory infarct involving the right frontal operculum. No associated mass effect. 2. Additional 2.8 cm early subacute left PCA territory infarct involving the parasagittal left occipital lobe. Associated mild petechial hemorrhage without mass effect. 3. Underlying age-related cerebral atrophy with chronic left parietal and right cerebellar infarcts. Electronically Signed   By: Jeannine Boga M.D.   On: 11/15/2019 02:24    PHYSICAL EXAM  Pulse Rate:  [44-75] 58 (12/29 1545) Resp:  [11-39] 16 (12/29 1545) BP: (131-167)/(49-120) 136/67 (12/29 1545) SpO2:  [92 %-100 %] 98 % (12/29 1545)  General - Well nourished, well developed, in no apparent distress.  Ophthalmologic - fundi not visualized due to noncooperation.  Cardiovascular - irregularly irregular heart rate and rhythm with bradycardia.  Mental Status -  Level of arousal and orientation to time, place, and person were intact. Language including expression, naming, repetition, comprehension was assessed and found intact. Fund of Knowledge was assessed and was intact.  Cranial Nerves II - XII - II - Visual field intact OU, however right lower quadrant decreased visual acuity. III, IV, VI - Extraocular movements intact. V - Facial sensation intact bilaterally. VII - Facial movement intact bilaterally. VIII - Hearing & vestibular intact bilaterally. X - Palate elevates symmetrically. XI - Chin turning & shoulder shrug  intact bilaterally. XII - Tongue protrusion intact.  Motor Strength - The patient's strength was normal in all extremities and pronator drift was absent.  Bulk was normal and fasciculations were absent.   Motor Tone - Muscle tone was assessed at the neck and appendages and was normal.  Reflexes - The patient's reflexes  were symmetrical in all extremities and he had no pathological reflexes.  Sensory - Light touch, temperature/pinprick were assessed and were symmetrical.    Coordination - The patient had normal movements in the hands with no ataxia or dysmetria.  Tremor was absent.  Gait and Station - deferred.   ASSESSMENT/PLAN Curtis Dunn is a 78 y.o. male with history of PAF on Xarelto, DM type I, HTN and HLD presenting with L hand numbness, weakness and incoordination.   Stroke:  Acute R frontal opercular MCA and subacute L PCA infarct occipital lobe - embolic, secondary to known atrial fibrillation on lower dose of xarelto  CT head mild atrophy. Possible posterior mid R cerebellar and separate denate nucleus cerebellar infarct. Mild small vessel disease. Sinus dz. Wax R ear.  CTA head & neck no LVO. R ICA 60% and L ICA 50% stenoses. ICA siphon atherosclerosis w/ moderate narrowing. B V4 atherosclerosis w/ plaque, moderate on L, severe R.   MRI  R frontal operculum MCA infarct. Subacute L PCA infarct L occipital lobe w/ mild petechial hemorrhage w/o mass effect. small vessel disease. Old L parietal and R cerebellar infarcts.   2D Echo pending  LDL 68  HgbA1c 6.4  Added SCDs for VTE prophylaxis as xarelto stopped given hemorrhagic transformation.  Xarelto (rivaroxaban) daily 15 mg prior to admission, now on aspirin 325 mg daily given petechial hemorrhage. Will switch low dose Xarelto PTA to full dose of eliquis 5mg  bid in 2 days (starting 12/31 evening)  Therapy recommendations:  pending   Disposition:  pending   Atrial Fibrillation  Home anticoagulation:   Xarelto (rivaroxaban) daily 15 mg  Now on aspirin 325 in hospital given petechial hemorrhage . Will switch low dose Xarelto PTA to full dose of eliquis 5mg  bid in 2 days (starting 12/31 evening)   Hypertension  Stable . Permissive hypertension (OK if < 180/105) but gradually normalize in 3-5 days . Long-term BP goal normotensive  Hyperlipidemia  Home meds:  mevacor 20, resumed in hospital (pravachol 20 formulary substitution)  Will not use intensive statin d/t LDL at goal < 70  LDL 68, goal < 70  Continue statin at discharge  Diabetes type I Controlled  HgbA1c 6.4, goal < 7.0  CBGs  SSI  PCP folllow up  Other Stroke Risk Factors  Advanced age  ETOH use, alcohol level <10, advised to drink no more than 2 drink(s) a day  Other Active Problems  CKD stage IIIa, Cre 1.36  Hx gout on allopruinal  Hx colon cancer  Hx of cervical fracture 4 years ago  Hospital day # 0  Neurology will sign off. Please call with questions. Pt will follow up with stroke clinic NP at [facility] in about 4 weeks. Thanks for the consult.  Rosalin Hawking, MD PhD Stroke Neurology 11/15/2019 3:56 PM  To contact Stroke Continuity provider, please refer to [email or web address] After hours, contact General Neurology

## 2019-11-16 DIAGNOSIS — E1059 Type 1 diabetes mellitus with other circulatory complications: Secondary | ICD-10-CM

## 2019-11-16 DIAGNOSIS — I480 Paroxysmal atrial fibrillation: Secondary | ICD-10-CM

## 2019-11-16 DIAGNOSIS — I10 Essential (primary) hypertension: Secondary | ICD-10-CM

## 2019-11-16 LAB — CBC WITH DIFFERENTIAL/PLATELET
Abs Immature Granulocytes: 0.02 10*3/uL (ref 0.00–0.07)
Basophils Absolute: 0 10*3/uL (ref 0.0–0.1)
Basophils Relative: 1 %
Eosinophils Absolute: 0.2 10*3/uL (ref 0.0–0.5)
Eosinophils Relative: 4 %
HCT: 37.2 % — ABNORMAL LOW (ref 39.0–52.0)
Hemoglobin: 12.4 g/dL — ABNORMAL LOW (ref 13.0–17.0)
Immature Granulocytes: 0 %
Lymphocytes Relative: 23 %
Lymphs Abs: 1.4 10*3/uL (ref 0.7–4.0)
MCH: 31.8 pg (ref 26.0–34.0)
MCHC: 33.3 g/dL (ref 30.0–36.0)
MCV: 95.4 fL (ref 80.0–100.0)
Monocytes Absolute: 0.7 10*3/uL (ref 0.1–1.0)
Monocytes Relative: 12 %
Neutro Abs: 3.7 10*3/uL (ref 1.7–7.7)
Neutrophils Relative %: 60 %
Platelets: 172 10*3/uL (ref 150–400)
RBC: 3.9 MIL/uL — ABNORMAL LOW (ref 4.22–5.81)
RDW: 12.9 % (ref 11.5–15.5)
WBC: 6.1 10*3/uL (ref 4.0–10.5)
nRBC: 0 % (ref 0.0–0.2)

## 2019-11-16 LAB — BASIC METABOLIC PANEL
Anion gap: 8 (ref 5–15)
BUN: 22 mg/dL (ref 8–23)
CO2: 28 mmol/L (ref 22–32)
Calcium: 9.5 mg/dL (ref 8.9–10.3)
Chloride: 107 mmol/L (ref 98–111)
Creatinine, Ser: 1.23 mg/dL (ref 0.61–1.24)
GFR calc Af Amer: >60 mL/min (ref >60)
GFR calc non Af Amer: 56 mL/min — ABNORMAL LOW (ref >60)
Glucose, Bld: 56 mg/dL — ABNORMAL LOW (ref 70–99)
Potassium: 3.7 mmol/L (ref 3.5–5.1)
Sodium: 143 mmol/L (ref 135–145)

## 2019-11-16 LAB — GLUCOSE, CAPILLARY
Glucose-Capillary: 196 mg/dL — ABNORMAL HIGH (ref 70–99)
Glucose-Capillary: 92 mg/dL (ref 70–99)
Glucose-Capillary: 380 mg/dL — ABNORMAL HIGH (ref 70–99)

## 2019-11-16 MED ORDER — APIXABAN 5 MG PO TABS: 5.0000 mg | ORAL_TABLET | Freq: Two times a day (BID) | ORAL | 1 refills | Status: DC

## 2019-11-16 MED ORDER — INSULIN GLARGINE 100 UNIT/ML ~~LOC~~ SOLN: 12.0000 [IU] | Freq: Two times a day (BID) | SUBCUTANEOUS | 11 refills | Status: AC

## 2019-11-16 MED ORDER — ASPIRIN 81 MG PO TBEC: 81.0000 mg | DELAYED_RELEASE_TABLET | Freq: Every day | ORAL | 0 refills | Status: AC

## 2019-11-16 MED ORDER — ASPIRIN EC 81 MG PO TBEC: 81.0000 mg | DELAYED_RELEASE_TABLET | Freq: Every day | ORAL | Status: DC

## 2019-11-16 NOTE — Consult Note (Signed)
   [facility] CM Inpatient Consult   11/16/2019  Curtis Dunn Jun 10, 1941 [id number]    HTA-CSNP (Chronic Special Needs Program):   Patient is currently active with [facility]) Care Management for chronic disease management services. Patient has been engaged by a [facility] CSNPcoordinator for DM (who is aware of this admission).   Our community based plan of care has focused on disease management and community resource support.  Review of medical record and MD brief summary states that: Curtis Dunn a 78 y.o.malewith a past Medical History of paroxysmal atrial fibrillation on Xarelto, diabetes mellitus type 1, hypertension, hyperlipidemia,    who presents with numbness of the left hand and has been diagnosed with large right MCA acute ischemic infarct and left PCA territory subacute large infarct with mild petechial hemorrhage without mass-effect.  Primary care provider is Dr. Reynold Bowen with [facility].  Patient will receive a post hospital call and will be evaluated for assessments and disease process education.  PT/ OT notes show no therapy follow-up needs for patient.  Current discharge disposition perInpatient transition of care CM note is home with self care; and was provided by a 30 day free card for Eliquis.    Will update [facility] CSNPcoordinator of patient's disposition/ needs for follow-up.   For additional questions,please contact:  Reed Eifert A. Lisel Siegrist, BSN, RN-BC [facility] Liaison Cell: [phone]

## 2019-11-16 NOTE — TOC Transition Note (Signed)
Transition of Care [facility]) - CM/SW Discharge Note   Patient Details  Name: Curtis Dunn MRN: [id number] Date of Birth: 10-26-1941  [facility]) CM/SW Contact:  Pollie Friar, RN Phone Number: 11/16/2019, 12:28 PM   Clinical Narrative:    Pt discharging home with self care. No f/u per PT/OT and no DME needs.  CM provided him 30 day free card for Eliquis.  Pt states his friend: Dola Argyle will provide transport home and will most likely stay with him for a few days.    Final next level of care: Home/Self Care Barriers to Discharge: No Barriers Identified   Patient Goals and CMS Choice        Discharge Placement                       Discharge Plan and Services                                     Social Determinants of Health (SDOH) Interventions     Readmission Risk Interventions No flowsheet data found.

## 2019-11-16 NOTE — Progress Notes (Signed)
NURSING PROGRESS NOTE  Curtis Dunn:[id number] Discharge Data: 11/16/2019 2:41 PM Attending Provider: No att. providers found RN:[id number], Annie Main, MD     [name] discharged per MD order.  Discussed with the patient the After Visit Summary and all questions fully answered. All IV's discontinued with no bleeding noted. All belongings returned to patient for patient to take home.   Last Vital Signs:  Blood pressure (!) 168/69, pulse (!) 58, temperature 98.3 F (36.8 C), temperature source Oral, resp. rate 15, height 5\' 11"  (1.803 m), weight 69 kg, SpO2 100 %.  Discharge Medication List Allergies as of 11/16/2019   No Known Allergies     Medication List    STOP taking these medications   Xarelto 15 MG Tabs tablet Generic drug: Rivaroxaban     TAKE these medications   alfuzosin 10 MG 24 hr tablet Commonly known as: UROXATRAL Take 10 mg by mouth daily with breakfast.   allopurinol 100 MG tablet Commonly known as: ZYLOPRIM Take 100 mg by mouth daily.   amLODipine 5 MG tablet Commonly known as: NORVASC Take 1 tablet by mouth once daily   apixaban 5 MG Tabs tablet Commonly known as: Eliquis Take 1 tablet (5 mg total) by mouth 2 (two) times daily.   aspirin 81 MG EC tablet Take 1 tablet (81 mg total) by mouth daily.   BETA-SITOSTEROL PLANT STEROLS PO Take 1 tablet by mouth 2 (two) times a day. PRODUCT NAME: SUPER BETA PROSTATE (beta sitosterol, lycopene, reishi mushroom)   Coenzyme Q-10 200 MG Caps Take 1 capsule by mouth daily.   [employer] Triple Magnesium Complex 400 MG Caps Generic drug: Magnesium Take 1 capsule by mouth daily.   Cyanocobalamin 5000 MCG Tbdp Take 1 tablet by mouth daily.   DHEA PO Take 100 mg by mouth daily.   FreeStyle Libre 14 Day Sensor Misc USE TO MONITOR BLOOD GLUCOSE. CHANGE EVERY 14 DAYS.   furosemide 40 MG tablet Commonly known as: LASIX Take 20 mg by mouth 2 (two) times daily.   Hawthorne Berry 550 MG Caps Take 550 mg  by mouth daily.   hydrALAZINE 50 MG tablet Commonly known as: APRESOLINE Take 1 tablet (50 mg total) by mouth 2 (two) times daily.   insulin aspart 100 UNIT/ML injection Commonly known as: novoLOG Inject 10 Units into the skin 3 (three) times daily before meals.   insulin glargine 100 UNIT/ML injection Commonly known as: LANTUS Inject 0.12 mLs (12 Units total) into the skin 2 (two) times daily. Inject 12 units at breakfast and 10 units at bedtime What changed:   how much to take  how to take this  when to take this  additional instructions   lovastatin 20 MG tablet Commonly known as: MEVACOR Take 20 mg by mouth at bedtime.   metoprolol succinate 25 MG 24 hr tablet Commonly known as: TOPROL-XL Take 1 tablet by mouth once daily   Omega 3 1200 MG Caps Take 1 capsule by mouth daily.   Potassium 99 MG Tabs Take 1 tablet by mouth daily.   PYCNOGENOL PO Take 100 mg by mouth daily.   ramipril 10 MG capsule Commonly known as: ALTACE Take 1 capsule (10 mg total) by mouth 2 (two) times daily.   vitamin C 1000 MG tablet Take 1,000 mg by mouth daily.   Vitamin D3 250 MCG (10000 UT) capsule Take 1 capsule by mouth daily.   vitamin E 1000 UNIT capsule Take 1,000 Units by mouth daily.  Zinc 50 MG Caps Take 1 tablet by mouth daily.

## 2019-11-16 NOTE — Discharge Summary (Signed)
Physician Discharge Summary  Curtis Dunn [id number] DOB: 16-Dec-1940 DOA: 11/14/2019  PCP: Reynold Bowen, MD  Admit date: 11/14/2019 Discharge date: 11/16/2019  Admitted From: Home Disposition: Home   Recommendations for Outpatient Follow-up:  1. Follow up with PCP in 1-2 weeks 2. Please obtain BMP/CBC in one week 3. Please follow up on the following pending results:  Home Health:No Equipment/Devices: None Discharge Condition: Stable CODE STATUS: Full Diet recommendation: Heart Healthy / Carb Modified   Brief/Interim Summary: Curtis Dunn is a 78 y.o. male with a Past Medical History of paroxysmal atrial fibrillation on Xarelto, diabetes mellitus type 1, hypertension, hyperlipidemiawho presents with numbness of the left hand and has been diagnosed with large right MCA acute ischemic infarct and left PCA territory subacute large infarct with mild petechial hemorrhage without mass-effect. CTA head and neck Negative CTA for large vessel occlusion.  Atheromatous plaque about the carotid bifurcations with associated stenoses of up to 60% on the right and 50% on the left.  Atherosclerotic change throughout the carotid siphons with associated moderate diffuse narrowing. Atheromatous plaque involving the V4 segments bilaterally with associated stenoses, moderate on the left, severe on the right. Left vertebral artery is dominant. LDL 68, HgA1c 6.4, echocardiogram without any emboli. Patient was on Xarelto due to A. Fib.  Xarelto was held initially due to petechial hemorrhages.  He will be switched to Eliquis starting 12/31 evening.  His symptoms have resolved. He will continue with statin and aspirin. He will follow-up with neurology.  Diabetes.  Well-controlled with A1c of 6.4.  Had couple of episodes of hypoglycemia.  Lantus was decreased and he will follow up with PCP for further management.  Paroxysmal A. fib.  In sinus rhythm with rate control.  Xarelto was initially  held due to some punctate hemorrhage.  It will be switched with full dose Eliquis starting from 11/17/19, as patient had embolic stroke on Xarelto.  Hypertension.  We held his home antihypertensives initially for permissive hypertension.  Patient was on multiple antihypertensives at home, advised to resume his home dose and need to follow-up with PCP for further management.  Discharge Diagnoses:  Principal Problem:   Stroke [facility]) Active Problems:   Type 1 diabetes mellitus ([place])   Essential hypertension   PAF (paroxysmal atrial fibrillation) [place] [facility])   Discharge Instructions  Discharge Instructions    Ambulatory referral to Neurology   Complete by: As directed    Follow up with stroke clinic NP (Jessica Vanschaick or Cecille Rubin, if both not available, consider Zachery Dauer, or [place]) at [facility] in about 4 weeks. Thanks.   Diet - low sodium heart healthy   Complete by: As directed    Discharge instructions   Complete by: As directed    It was pleasure taking care of you. Please start your Eliquis from tomorrow night.  You will not take Xarelto now. He will also take a baby aspirin daily. We are changing the dose of Lantus as your blood sugar was little low. You will take 12 units in the morning and 10 units at bedtime.  Please follow-up with your PCP for further management. Continue taking rest of your medications. You also need to follow-up with neurology.   Increase activity slowly   Complete by: As directed      Allergies as of 11/16/2019   No Known Allergies     Medication List    STOP taking these medications   Xarelto 15 MG Tabs tablet Generic drug: Rivaroxaban  TAKE these medications   alfuzosin 10 MG 24 hr tablet Commonly known as: UROXATRAL Take 10 mg by mouth daily with breakfast.   allopurinol 100 MG tablet Commonly known as: ZYLOPRIM Take 100 mg by mouth daily.   amLODipine 5 MG tablet Commonly known as: NORVASC Take 1 tablet by mouth once daily    apixaban 5 MG Tabs tablet Commonly known as: Eliquis Take 1 tablet (5 mg total) by mouth 2 (two) times daily.   aspirin 81 MG EC tablet Take 1 tablet (81 mg total) by mouth daily.   BETA-SITOSTEROL PLANT STEROLS PO Take 1 tablet by mouth 2 (two) times a day. PRODUCT NAME: SUPER BETA PROSTATE (beta sitosterol, lycopene, reishi mushroom)   Coenzyme Q-10 200 MG Caps Take 1 capsule by mouth daily.   [employer] Triple Magnesium Complex 400 MG Caps Generic drug: Magnesium Take 1 capsule by mouth daily.   Cyanocobalamin 5000 MCG Tbdp Take 1 tablet by mouth daily.   DHEA PO Take 100 mg by mouth daily.   FreeStyle Libre 14 Day Sensor Misc USE TO MONITOR BLOOD GLUCOSE. CHANGE EVERY 14 DAYS.   furosemide 40 MG tablet Commonly known as: LASIX Take 20 mg by mouth 2 (two) times daily.   Hawthorne Berry 550 MG Caps Take 550 mg by mouth daily.   hydrALAZINE 50 MG tablet Commonly known as: APRESOLINE Take 1 tablet (50 mg total) by mouth 2 (two) times daily.   insulin aspart 100 UNIT/ML injection Commonly known as: novoLOG Inject 10 Units into the skin 3 (three) times daily before meals.   insulin glargine 100 UNIT/ML injection Commonly known as: LANTUS Inject 0.12 mLs (12 Units total) into the skin 2 (two) times daily. Inject 12 units at breakfast and 10 units at bedtime What changed:   how much to take  how to take this  when to take this  additional instructions   lovastatin 20 MG tablet Commonly known as: MEVACOR Take 20 mg by mouth at bedtime.   metoprolol succinate 25 MG 24 hr tablet Commonly known as: TOPROL-XL Take 1 tablet by mouth once daily   Omega 3 1200 MG Caps Take 1 capsule by mouth daily.   Potassium 99 MG Tabs Take 1 tablet by mouth daily.   PYCNOGENOL PO Take 100 mg by mouth daily.   ramipril 10 MG capsule Commonly known as: ALTACE Take 1 capsule (10 mg total) by mouth 2 (two) times daily.   vitamin C 1000 MG tablet Take 1,000 mg by mouth  daily.   Vitamin D3 250 MCG (10000 UT) capsule Take 1 capsule by mouth daily.   vitamin E 1000 UNIT capsule Take 1,000 Units by mouth daily.   Zinc 50 MG Caps Take 1 tablet by mouth daily.      Follow-up Information    [facility]. Schedule an appointment as soon as possible for a visit in 4 week(s).   Specialty: Neurology Contact information: [address] [place] [phone]       Reynold Bowen, MD. Schedule an appointment as soon as possible for a visit.   Specialty: Endocrinology Contact information: [place] [address] [phone]        Jerline Pain, MD .   Specialty: Cardiology Contact information: [phone] [address] [place] [address] [phone]          No Known Allergies  Consultations:  Neurology  Procedures/Studies: CT Angio Head W or Wo Contrast  Result Date: 11/15/2019 CLINICAL DATA:  Follow-up examination for stroke. EXAM: CT ANGIOGRAPHY HEAD AND NECK TECHNIQUE: Multidetector CT imaging of the head and neck was performed using the standard protocol during bolus administration of intravenous contrast. Multiplanar CT image reconstructions and MIPs were obtained to evaluate the vascular anatomy. Carotid stenosis measurements (when applicable) are obtained utilizing NASCET criteria, using the distal internal carotid diameter as the denominator. CONTRAST:  14mL OMNIPAQUE IOHEXOL 350 MG/ML SOLN COMPARISON:  Head CT from 11/14/2019. FINDINGS: CTA NECK FINDINGS Aortic arch: Visualized aortic arch of normal caliber. Origin of the great vessels incompletely visualized on this exam. Visualized subclavian arteries widely patent. Right carotid system: Right common carotid artery widely patent from its origin to the bifurcation. Eccentric plaque about the right bifurcation/proximal right ICA with associated stenosis of up to 60% by NASCET criteria. Right ICA  otherwise patent distally to the skull base without stenosis, dissection or occlusion. Left carotid system: Visualized left CCA widely patent to the bifurcation without stenosis. Bulky calcified plaque about the left bifurcation/proximal left ICA with associated stenosis of up to 50% by NASCET criteria. Left ICA widely patent distally to the skull base without stenosis, dissection, or occlusion. Vertebral arteries: Both vertebral arteries arise from the subclavian arteries. Left vertebral artery dominant. Visualized vertebral arteries widely patent without stenosis, dissection, or occlusion. Right V2/V3 junction not well visualized due to streak artifact. Skeleton: No acute osseous finding. No discrete or worrisome osseous lesions. Moderate multilevel cervical spondylosis at C5-6 and C6-7. Basilar invagination with scattered osseous fragments about the dens noted, chronic in appearance. Other neck: No other acute soft tissue abnormality within the neck. No mass lesion or adenopathy. Upper chest: Visualized upper chest demonstrates no acute or significant finding. Review of the MIP images confirms the above findings CTA HEAD FINDINGS Anterior circulation: Petrous segments widely patent bilaterally. Calcified atheromatous plaque throughout the carotid siphons with associated moderate diffuse narrowing bilaterally. ICA termini well perfused. A1 segments patent bilaterally. Normal anterior communicating artery. Anterior cerebral arteries widely patent to their distal aspects. No M1 stenosis or occlusion. Normal MCA bifurcations. Distal MCA branches well perfused and symmetric. Posterior circulation: Calcified atheromatous plaque within the dominant left V4 segment with associated moderate approximate 50% stenosis. Left V4 otherwise patent to the vertebrobasilar junction. Patent left PICA. Right vertebral patent as it courses into the cranial vault. Focal atheromatous plaque with resultant severe short-segment stenosis  seen at the proximal right V4 segment (series 7, image 166). Right vertebral largely terminates in PICA, although a tiny branch ascending towards the vertebrobasilar junction. Right PICA patent as well. Basilar diminutive but patent to its distal aspect without stenosis. Superior cerebral arteries patent bilaterally. Both PCA supplied via hypoplastic P1 segments and robust posterior communicating arteries. Both PCAs widely patent to their distal aspects. Venous sinuses: Grossly patent allowing for timing the contrast bolus. Anatomic variants: None significant.  No intracranial aneurysm. Review of the MIP images confirms the above findings IMPRESSION: 1. Negative CTA for large vessel occlusion. 2. Atheromatous plaque about the carotid bifurcations with associated stenoses of up to 60% on the right and 50% on the left. 3. Atherosclerotic change throughout the carotid siphons with associated moderate diffuse narrowing. 4. Atheromatous plaque involving the V4 segments bilaterally with associated stenoses, moderate on the left, severe on the right. Left vertebral artery is dominant. Electronically Signed   By: Jeannine Boga M.D.   On: 11/15/2019 00:44   CT HEAD WO CONTRAST  Result Date: 11/14/2019 CLINICAL DATA:  Left hand  numbness EXAM: CT HEAD WITHOUT CONTRAST TECHNIQUE: Contiguous axial images were obtained from the base of the skull through the vertex without intravenous contrast. COMPARISON:  December 25, 2015 FINDINGS: Brain: Mild diffuse atrophy slightly increased from prior study. There is no intracranial mass, hemorrhage, extra-axial fluid collection, or midline shift. There is a focal infarct in the posterior mid right cerebellum. There is also asymmetric decreased attenuation in the dentate nucleus on the right compared to the left. Recent infarcts in these areas of the right cerebellum are of concern. Elsewhere there is mild small vessel disease in the centra semiovale bilaterally. Vascular: No  hyperdense vessel. There is calcification in each carotid siphon region. Skull: The bony calvarium appears intact. Sinuses/Orbits: There is mucosal thickening in each maxillary antrum. There is a retention cyst in the inferior right maxillary antrum. There is opacification in multiple ethmoid air cells. There is mucosal thickening in the anterior left sphenoid sinus. Orbits appear symmetric bilaterally. Other: Mastoid air cells are clear. There is debris in the right external auditory canal. IMPRESSION: Mild diffuse atrophy, slightly increased from prior study. Suspect recent and potentially acute infarct in the posterior mid right cerebellum. There is decreased attenuation in the dentate nucleus of the right cerebellum compared to the left side which may represent a second nearby focus of recent and possibly acute infarct. Elsewhere there is mild periventricular small vessel disease. No mass or hemorrhage. There are foci of arterial vascular calcification. There is multifocal paranasal sinus disease. There is probable cerumen in the right external auditory canal. Electronically Signed   By: Lowella Grip III M.D.   On: 11/14/2019 15:16   CT Angio Neck W and/or Wo Contrast  Result Date: 11/15/2019 CLINICAL DATA:  Follow-up examination for stroke. EXAM: CT ANGIOGRAPHY HEAD AND NECK TECHNIQUE: Multidetector CT imaging of the head and neck was performed using the standard protocol during bolus administration of intravenous contrast. Multiplanar CT image reconstructions and MIPs were obtained to evaluate the vascular anatomy. Carotid stenosis measurements (when applicable) are obtained utilizing NASCET criteria, using the distal internal carotid diameter as the denominator. CONTRAST:  55mL OMNIPAQUE IOHEXOL 350 MG/ML SOLN COMPARISON:  Head CT from 11/14/2019. FINDINGS: CTA NECK FINDINGS Aortic arch: Visualized aortic arch of normal caliber. Origin of the great vessels incompletely visualized on this exam.  Visualized subclavian arteries widely patent. Right carotid system: Right common carotid artery widely patent from its origin to the bifurcation. Eccentric plaque about the right bifurcation/proximal right ICA with associated stenosis of up to 60% by NASCET criteria. Right ICA otherwise patent distally to the skull base without stenosis, dissection or occlusion. Left carotid system: Visualized left CCA widely patent to the bifurcation without stenosis. Bulky calcified plaque about the left bifurcation/proximal left ICA with associated stenosis of up to 50% by NASCET criteria. Left ICA widely patent distally to the skull base without stenosis, dissection, or occlusion. Vertebral arteries: Both vertebral arteries arise from the subclavian arteries. Left vertebral artery dominant. Visualized vertebral arteries widely patent without stenosis, dissection, or occlusion. Right V2/V3 junction not well visualized due to streak artifact. Skeleton: No acute osseous finding. No discrete or worrisome osseous lesions. Moderate multilevel cervical spondylosis at C5-6 and C6-7. Basilar invagination with scattered osseous fragments about the dens noted, chronic in appearance. Other neck: No other acute soft tissue abnormality within the neck. No mass lesion or adenopathy. Upper chest: Visualized upper chest demonstrates no acute or significant finding. Review of the MIP images confirms the above findings CTA HEAD FINDINGS Anterior  circulation: Petrous segments widely patent bilaterally. Calcified atheromatous plaque throughout the carotid siphons with associated moderate diffuse narrowing bilaterally. ICA termini well perfused. A1 segments patent bilaterally. Normal anterior communicating artery. Anterior cerebral arteries widely patent to their distal aspects. No M1 stenosis or occlusion. Normal MCA bifurcations. Distal MCA branches well perfused and symmetric. Posterior circulation: Calcified atheromatous plaque within the  dominant left V4 segment with associated moderate approximate 50% stenosis. Left V4 otherwise patent to the vertebrobasilar junction. Patent left PICA. Right vertebral patent as it courses into the cranial vault. Focal atheromatous plaque with resultant severe short-segment stenosis seen at the proximal right V4 segment (series 7, image 166). Right vertebral largely terminates in PICA, although a tiny branch ascending towards the vertebrobasilar junction. Right PICA patent as well. Basilar diminutive but patent to its distal aspect without stenosis. Superior cerebral arteries patent bilaterally. Both PCA supplied via hypoplastic P1 segments and robust posterior communicating arteries. Both PCAs widely patent to their distal aspects. Venous sinuses: Grossly patent allowing for timing the contrast bolus. Anatomic variants: None significant.  No intracranial aneurysm. Review of the MIP images confirms the above findings IMPRESSION: 1. Negative CTA for large vessel occlusion. 2. Atheromatous plaque about the carotid bifurcations with associated stenoses of up to 60% on the right and 50% on the left. 3. Atherosclerotic change throughout the carotid siphons with associated moderate diffuse narrowing. 4. Atheromatous plaque involving the V4 segments bilaterally with associated stenoses, moderate on the left, severe on the right. Left vertebral artery is dominant. Electronically Signed   By: Jeannine Boga M.D.   On: 11/15/2019 00:44   MR BRAIN WO CONTRAST  Result Date: 11/15/2019 CLINICAL DATA:  Initial evaluation for acute stroke. EXAM: MRI HEAD WITHOUT CONTRAST TECHNIQUE: Multiplanar, multiecho pulse sequences of the brain and surrounding structures were obtained without intravenous contrast. COMPARISON:  Prior CT and CTA from earlier the same day. FINDINGS: Brain: Generalized age-related cerebral atrophy. Small area of encephalomalacia and gliosis involving the left parietal lobe compatible with a chronic  posterior left MCA territory infarct. Associated chronic hemosiderin staining noted. Additional small chronic right cerebellar infarct noted as well. Confluent area of restricted diffusion measuring approximately 3.8 cm in diameter seen involving the cortical and subcortical right frontal operculum, compatible with acute right MCA territory infarct (series 5, image 78). Patchy involvement of the underlying insula noted. No associated hemorrhage or mass effect. Additional small area of restricted diffusion measuring 2.8 cm seen involving the parasagittal left occipital lobe, compatible with a left PCA territory infarct. Associated mild petechial hemorrhage evident on corresponding SWI sequence. No associated mass effect. This area of ischemia is more early subacute in appearance. Otherwise, gray-white matter differentiation maintained without evidence for acute or subacute ischemia. No acute or chronic intracranial hemorrhage elsewhere within the brain. No mass lesion, midline shift or mass effect. No hydrocephalus. No extra-axial fluid collection. Pituitary gland suprasellar region normal. Midline structures intact. Vascular: Major intracranial vascular flow voids are maintained. Skull and upper cervical spine: Basilar invagination noted at the craniocervical junction. Associated degenerative pannus formation with scattered osseous fragments noted about the dens. No more than mild stenosis at the cervicomedullary junction. Bone marrow signal intensity within normal limits. No scalp soft tissue abnormality. Sinuses/Orbits: Patient status post bilateral ocular lens replacement. Scattered mucosal thickening noted throughout the paranasal sinuses. Superimposed small retention cyst noted within the right maxillary sinus. No mastoid effusion. Inner ear structures normal. Other: None. IMPRESSION: 1. 3.8 cm acute ischemic nonhemorrhagic right MCA territory infarct involving  the right frontal operculum. No associated mass  effect. 2. Additional 2.8 cm early subacute left PCA territory infarct involving the parasagittal left occipital lobe. Associated mild petechial hemorrhage without mass effect. 3. Underlying age-related cerebral atrophy with chronic left parietal and right cerebellar infarcts. Electronically Signed   By: Jeannine Boga M.D.   On: 11/15/2019 02:24   ECHOCARDIOGRAM COMPLETE  Result Date: 11/15/2019   ECHOCARDIOGRAM REPORT   Patient Name:   BELEN LALLO Date of Exam: 11/15/2019 Medical Rec #:  [id number]           Height:       71.0 in Accession #:    [id number]          Weight:       160.5 lb Date of Birth:  [birth year]          BSA:          1.92 m Patient Age:    13 years            BP:           138/49 mmHg Patient Gender: M                   HR:           71 bpm. Exam Location:  Inpatient Procedure: 2D Echo, Cardiac Doppler and Color Doppler Indications:    CVA  History:        Patient has prior history of Echocardiogram examinations, most                 recent 09/09/2012. Arrythmias:Atrial Fibrillation; Risk                 Factors:Hypertension and Diabetes. CKD.  Sonographer:    Dustin Flock Referring Phys: [place]  1. Left ventricular ejection fraction, by visual estimation, is 60 to 65%. The left ventricle has normal function. There is no left ventricular hypertrophy.  2. The left ventricle has no regional wall motion abnormalities.  3. Global right ventricle has normal systolic function.The right ventricular size is normal. No increase in right ventricular wall thickness.  4. Left atrial size was normal.  5. Right atrial size was normal.  6. Trivial pericardial effusion is present.  7. Mild mitral annular calcification.  8. The mitral valve is normal in structure. Trivial mitral valve regurgitation. No evidence of mitral stenosis.  9. The tricuspid valve is normal in structure. 10. The aortic valve is normal in structure. Aortic valve regurgitation is  trivial. No evidence of aortic valve sclerosis or stenosis. 11. The pulmonic valve was normal in structure. Pulmonic valve regurgitation is not visualized. 12. TR signal is inadequate for assessing pulmonary artery systolic pressure. 13. The inferior vena cava is normal in size with greater than 50% respiratory variability, suggesting right atrial pressure of 3 mmHg. FINDINGS  Left Ventricle: Left ventricular ejection fraction, by visual estimation, is 60 to 65%. The left ventricle has normal function. The left ventricle has no regional wall motion abnormalities. There is no left ventricular hypertrophy. Normal left atrial pressure. Right Ventricle: The right ventricular size is normal. No increase in right ventricular wall thickness. Global RV systolic function is has normal systolic function. Left Atrium: Left atrial size was normal in size. Right Atrium: Right atrial size was normal in size Pericardium: Trivial pericardial effusion is present. Mitral Valve: The mitral valve is normal in structure. Mild mitral annular calcification. Trivial mitral valve regurgitation. No evidence of  mitral valve stenosis by observation. Tricuspid Valve: The tricuspid valve is normal in structure. Tricuspid valve regurgitation is not demonstrated. Aortic Valve: The aortic valve is normal in structure.. There is mild thickening and mild calcification of the aortic valve. Aortic valve regurgitation is trivial. The aortic valve is structurally normal, with no evidence of sclerosis or stenosis. There is mild thickening of the aortic valve. There is mild calcification of the aortic valve. Pulmonic Valve: The pulmonic valve was normal in structure. Pulmonic valve regurgitation is not visualized. Pulmonic regurgitation is not visualized. Aorta: The aortic root, ascending aorta and aortic arch are all structurally normal, with no evidence of dilitation or obstruction. Venous: The inferior vena cava was not well visualized. The inferior  vena cava is normal in size with greater than 50% respiratory variability, suggesting right atrial pressure of 3 mmHg. IAS/Shunts: No atrial level shunt detected by color flow Doppler. There is no evidence of a patent foramen ovale. No ventricular septal defect is seen or detected. There is no evidence of an atrial septal defect.  Skeet Latch MD Electronically signed by Skeet Latch MD Signature Date/Time: 11/15/2019/5:34:26 PM    Final    Subjective: Patient was feeling better when seen this morning.  Has no new complaints and would like to go home.  Discharge Exam: Vitals:   11/16/19 0326 11/16/19 0802  BP: (!) 139/43 (!) 168/69  Pulse: (!) 46 (!) 58  Resp: 17 15  Temp: 98.2 F (36.8 C) 98.3 F (36.8 C)  SpO2: 100% 100%   Vitals:   11/15/19 2121 11/15/19 2359 11/16/19 0326 11/16/19 0802  BP: (!) 158/61 (!) 157/56 (!) 139/43 (!) 168/69  Pulse: (!) 56 (!) 47 (!) 46 (!) 58  Resp: 16 16 17 15   Temp: 99.5 F (37.5 C) 98.3 F (36.8 C) 98.2 F (36.8 C) 98.3 F (36.8 C)  TempSrc: Oral Oral  Oral  SpO2: 98% 97% 100% 100%  Weight:      Height:        General: Pt is alert, awake, not in acute distress Cardiovascular: RRR, S1/S2 +, no rubs, no gallops Respiratory: CTA bilaterally, no wheezing, no rhonchi Abdominal: Soft, NT, ND, bowel sounds + Extremities: no edema, no cyanosis   The results of significant diagnostics from this hospitalization (including imaging, microbiology, ancillary and laboratory) are listed below for reference.    Microbiology: Recent Results (from the past 240 hour(s))  SARS CORONAVIRUS 2 (TAT 6-24 HRS) Nasopharyngeal Nasopharyngeal Swab     Status: None   Collection Time: 11/14/19  7:21 PM   Specimen: Nasopharyngeal Swab  Result Value Ref Range Status   SARS Coronavirus 2 NEGATIVE NEGATIVE Final    Comment: (NOTE) SARS-CoV-2 target nucleic acids are NOT DETECTED. The SARS-CoV-2 RNA is generally detectable in upper and lower respiratory  specimens during the acute phase of infection. Negative results do not preclude SARS-CoV-2 infection, do not rule out co-infections with other pathogens, and should not be used as the sole basis for treatment or other patient management decisions. Negative results must be combined with clinical observations, patient history, and epidemiological information. The expected result is Negative. Fact Sheet for Patients: SugarRoll.be Fact Sheet for Healthcare Providers: https://www.woods-mathews.com/ This test is not yet approved or cleared by the [place] FDA and  has been authorized for detection and/or diagnosis of SARS-CoV-2 by FDA under an Emergency Use Authorization (EUA). This EUA will remain  in effect (meaning this test can be used) for the duration of the COVID-19 declaration  under Section 56 4(b)(1) of the Act, 21 U.S.C. section 360bbb-3(b)(1), unless the authorization is terminated or revoked sooner. Performed at [facility], [place] [address], [place], [place] [address]      Labs: BNP (last 3 results) No results for input(s): BNP in the last 8760 hours. Basic Metabolic Panel: Recent Labs  Lab 11/14/19 1437 11/16/19 0343  NA 140 143  K 4.2 3.7  CL 100 107  CO2 28 28  GLUCOSE 180* 56*  BUN 26* 22  CREATININE 1.36* 1.23  CALCIUM 9.8 9.5   Liver Function Tests: Recent Labs  Lab 11/14/19 1437  AST 30  ALT 20  ALKPHOS 62  BILITOT 0.8  PROT 6.5  ALBUMIN 4.1   No results for input(s): LIPASE, AMYLASE in the last 168 hours. No results for input(s): AMMONIA in the last 168 hours. CBC: Recent Labs  Lab 11/14/19 1437 11/16/19 0343  WBC 7.1 6.1  NEUTROABS 5.2 3.7  HGB 13.0 12.4*  HCT 39.4 37.2*  MCV 97.0 95.4  PLT 188 172   Cardiac Enzymes: No results for input(s): CKTOTAL, CKMB, CKMBINDEX, TROPONINI in the last 168 hours. BNP: Invalid input(s): POCBNP CBG: Recent Labs  Lab 11/15/19 1014 11/15/19 1209  11/15/19 1704 11/15/19 2214 11/16/19 0613  GLUCAP 353* 341* 263* 196* 92   D-Dimer No results for input(s): DDIMER in the last 72 hours. Hgb A1c Recent Labs    11/15/19 0500  HGBA1C 6.4*   Lipid Profile Recent Labs    11/15/19 0500  CHOL 126  HDL 46  LDLCALC 68  TRIG 62  CHOLHDL 2.7   Thyroid function studies No results for input(s): TSH, T4TOTAL, T3FREE, THYROIDAB in the last 72 hours.  Invalid input(s): FREET3 Anemia work up No results for input(s): VITAMINB12, FOLATE, FERRITIN, TIBC, IRON, RETICCTPCT in the last 72 hours. Urinalysis    Component Value Date/Time   COLORURINE YELLOW 11/15/2019 0243   APPEARANCEUR CLEAR 11/15/2019 0243   LABSPEC >1.046 (H) 11/15/2019 0243   PHURINE 6.0 11/15/2019 0243   GLUCOSEU >=500 (A) 11/15/2019 0243   HGBUR NEGATIVE 11/15/2019 0243   BILIRUBINUR NEGATIVE 11/15/2019 0243   KETONESUR 20 (A) 11/15/2019 0243   PROTEINUR NEGATIVE 11/15/2019 0243   UROBILINOGEN 1.0 02/12/2014 2348   NITRITE NEGATIVE 11/15/2019 0243   LEUKOCYTESUR NEGATIVE 11/15/2019 0243   Sepsis Labs Invalid input(s): PROCALCITONIN,  WBC,  LACTICIDVEN Microbiology Recent Results (from the past 240 hour(s))  SARS CORONAVIRUS 2 (TAT 6-24 HRS) Nasopharyngeal Nasopharyngeal Swab     Status: None   Collection Time: 11/14/19  7:21 PM   Specimen: Nasopharyngeal Swab  Result Value Ref Range Status   SARS Coronavirus 2 NEGATIVE NEGATIVE Final    Comment: (NOTE) SARS-CoV-2 target nucleic acids are NOT DETECTED. The SARS-CoV-2 RNA is generally detectable in upper and lower respiratory specimens during the acute phase of infection. Negative results do not preclude SARS-CoV-2 infection, do not rule out co-infections with other pathogens, and should not be used as the sole basis for treatment or other patient management decisions. Negative results must be combined with clinical observations, patient history, and epidemiological information. The expected result is  Negative. Fact Sheet for Patients: SugarRoll.be Fact Sheet for Healthcare Providers: https://www.woods-mathews.com/ This test is not yet approved or cleared by the [place] FDA and  has been authorized for detection and/or diagnosis of SARS-CoV-2 by FDA under an Emergency Use Authorization (EUA). This EUA will remain  in effect (meaning this test can be used) for the duration of the  COVID-19 declaration under Section 56 4(b)(1) of the Act, 21 U.S.C. section 360bbb-3(b)(1), unless the authorization is terminated or revoked sooner. Performed at [facility], [place] [address] [place], [place] [address]     Time coordinating discharge: Over 30 minutes  SIGNED:  Lorella Nimrod, MD  [facility] 11/16/2019, 11:25 AM Pager [phone]  If 7PM-7AM, please contact night-coverage [email or web address] Password [email or web address]  This record has been created using Systems analyst. Errors have been sought and corrected,but may not always be located. Such creation errors do not reflect on the standard of care.

## 2019-11-16 NOTE — Evaluation (Signed)
Speech Language Pathology Evaluation Patient Details Name: Curtis Dunn MRN: [id number] DOB: [birth year] Today's Date: 11/16/2019 Time: 8338-2505 SLP Time Calculation (min) (ACUTE ONLY): 19 min  Problem List:  Patient Active Problem List   Diagnosis Date Noted  . Cerebellar stroke ([place]) 11/15/2019  . Acute CVA (cerebrovascular accident) ([place]) 11/14/2019  . Pain in joint of right ankle 05/12/2019  . Cellulitis and abscess of foot, except toes 05/03/2019  . Paronychia of great toe of right foot 05/03/2019  . Multiple fractures of ribs of both sides 12/29/2015  . Flail chest 12/25/2015  . AKI (acute kidney injury) ([place]) 12/25/2015  . Hemopneumothorax on left 12/25/2015  . IDDM (insulin dependent diabetes mellitus) 12/25/2015  . History of colon cancer 08/21/2014  . Acute blood loss anemia 02/11/2014  . Fracture dislocation of cervical spine ([place]) 02/10/2014  . C1 cervical fracture ([place]) 02/10/2014  . Fall 02/10/2014  . Distal radius fracture, right 02/10/2014  . Concussion 02/10/2014  . Facial laceration 02/10/2014  . Anticoagulated 02/10/2014  . PAF (paroxysmal atrial fibrillation) ([place]) 09/08/2012  . Palpitations 03/05/2012  . MALIGNANT NEOPLASM OF CECUM 12/11/2007  . COLONIC POLYPS 12/11/2007  . Type 1 diabetes mellitus ([place]) 12/11/2007  . Essential hypertension 12/11/2007   Past Medical History:  Past Medical History:  Diagnosis Date  . Broken neck ([place])    in March 2015  . Cataract   . Colon cancer ([place])   . Colon polyp   . Diabetes mellitus    Type 1  . Dysrhythmia    a fib  . Gout   . Hyperlipidemia   . Hypertension   . Lipoma of back   . Paroxysmal atrial fibrillation [facility])    Past Surgical History:  Past Surgical History:  Procedure Laterality Date  . ANKLE SURGERY     right  . APPENDECTOMY  2001  . arm surgery     right shoulder surgery  . CATARACT EXTRACTION EXTRACAPSULAR Bilateral   . COLON RESECTION    . ELBOW FRACTURE SURGERY    .  INGUINAL HERNIA REPAIR Left 03/13/2016   Procedure: LEFT INGUINAL HERNIA REPAIR WITH UNBILICAL HERNIA REPAIR;  Surgeon: [name], MD;  Location: [facility];  Service: General;  Laterality: Left;  . INSERTION OF MESH Left 03/13/2016   Procedure: INSERTION OF MESH;  Surgeon: [name], MD;  Location: [facility];  Service: General;  Laterality: Left;  [name] MASS EXCISION N/A 07/20/2019   Procedure: EXCISION OF BACK MASS;  Surgeon: Rolm Bookbinder, MD;  Location: [facility];  Service: General;  Laterality: N/A;  MAC AND LOCAL  . VASECTOMY     HPI:  78 y.o. male with a Past Medical History of paroxysmal atrial fibrillation on Xarelto, diabetes mellitus type 1, hypertension, hyperlipidemiawho presents with numbness of the left hand and has been diagnosed with large right MCA acute ischemic infarct and left PCA territory subacute large infarct with mild petechial hemorrhage without mass-effect.   Assessment / Plan / Recommendation Clinical Impression    Pt seen in room for evaluation of speech/language and cognitive linguistic skills. Pt oriented x4, speech was clear and intelligible. No dysarthria noted. Patient denied word-finding difficulties and was able to engage in conversation. Pt's cognitive linguistic skills appear to be at or close to baseline and are functionally adequate. Pt was assessed using portions of the COGNISTAT (see below for additional information).  Pt performed within the average range on all subtests given.Overall, patient performance is [facility].   COGNISTAT - all  subtests are within the average range.  Attention: 8/8 Comprehension: 4/6 Repetition: not assessed Naming: 8/8 Construction: not assessed Memory: 12/12  Calculations: 4/4 Similarities: 8/8 Judgment: 5/6   Inpatient SLP services for speech/language or cognitive-linguistic skills not indicated at this time.      SLP Assessment  SLP Recommendation/Assessment: Patient does not need any further  Speech Lanaguage Pathology Services SLP Visit Diagnosis: Cognitive communication deficit (R41.841)    Follow Up Recommendations    N/A.    Frequency and Duration   N/A        SLP Evaluation Cognition  Overall Cognitive Status: Within Functional Limits for tasks assessed Arousal/Alertness: Awake/alert Orientation Level: Oriented X4 Attention: Focused Focused Attention: Appears intact Memory: Appears intact Awareness: Appears intact Problem Solving: Appears intact Executive Function: Reasoning Reasoning: Appears intact Safety/Judgment: Appears intact       Comprehension  Auditory Comprehension Overall Auditory Comprehension: Appears within functional limits for tasks assessed Yes/No Questions: Within Functional Limits Commands: Within Functional Limits Conversation: Complex Interfering Components: Hearing EffectiveTechniques: Visual/Gestural cues;Increased volume Reading Comprehension Reading Status: Not tested    Expression Expression Primary Mode of Expression: Verbal Verbal Expression Overall Verbal Expression: Appears within functional limits for tasks assessed Initiation: No impairment Level of Generative/Spontaneous Verbalization: Sentence;Conversation Repetition: No impairment Naming: No impairment Written Expression Dominant Hand: Right   Oral / Motor  Oral Motor/Sensory Function Overall Oral Motor/Sensory Function: Within functional limits Motor Speech Overall Motor Speech: Appears within functional limits for tasks assessed   GO                    Marina Goodell, M.Ed., CCC-SLP Speech Therapy Acute Rehabilitation 11/16/2019, 1:25 PM

## 2019-11-16 NOTE — Evaluation (Signed)
Physical Therapy Evaluation and Discharge Patient Details Name: Curtis Dunn MRN: [id number] DOB: May 14, 1941 Today's Date: 11/16/2019   History of Present Illness  78 y.o. male with a Past Medical History of paroxysmal atrial fibrillation on Xarelto, diabetes mellitus type 1, hypertension, hyperlipidemiawho presents with numbness of the left hand and has been diagnosed with large right MCA acute ischemic infarct and left PCA territory subacute large infarct with mild petechial hemorrhage without mass-effect.  Clinical Impression  Patient evaluated by Physical Therapy with no further acute PT needs identified. Pt appears to be close to his functional baseline and denies residual deficits. Ambulating hallway distances independently and negotiated a flight of steps to simulate home environment; HR peak 86 bpm. Education provided regarding exercise recommendations, BEFAST stroke symptoms and risk factors. All education has been completed and the patient has no further questions. No follow-up Physical Therapy or equipment needs. PT is signing off. Thank you for this referral.     Follow Up Recommendations No PT follow up    Equipment Recommendations  None recommended by PT    Recommendations for Other Services       Precautions / Restrictions Precautions Precautions: None Restrictions Weight Bearing Restrictions: No      Mobility  Bed Mobility Overal bed mobility: Independent                Transfers Overall transfer level: Independent Equipment used: None                Ambulation/Gait Ambulation/Gait assistance: Independent Gait Distance (Feet): 500 Feet Assistive device: None Gait Pattern/deviations: WFL(Within Functional Limits)   Gait velocity interpretation: >2.62 ft/sec, indicative of community ambulatory General Gait Details: Kyphotic posture, no gait abnormalities or gross imbalance noted  Stairs Stairs: Yes Stairs assistance: Modified  independent (Device/Increase time) Stair Management: One rail Right Number of Stairs: 16 General stair comments: Use of right rail, step over step pattern  Wheelchair Mobility    Modified Rankin (Stroke Patients Only) Modified Rankin (Stroke Patients Only) Pre-Morbid Rankin Score: No symptoms Modified Rankin: No symptoms     Balance Overall balance assessment: Independent                                           Pertinent Vitals/Pain Pain Assessment: No/denies pain    Home Living Family/patient expects to be discharged to:: Private residence Living Arrangements: Alone Available Help at Discharge: Friend(s);Available PRN/intermittently Type of Home: House Home Access: Stairs to enter     Home Layout: Two level Home Equipment: Crutches      Prior Function Level of Independence: Independent         Comments: works out at [employer] 3 days/week; enjoys sports     Journalist, newspaper   Dominant Hand: Right    Extremity/Trunk Assessment   Upper Extremity Assessment Upper Extremity Assessment: Overall WFL for tasks assessed    Lower Extremity Assessment Lower Extremity Assessment: Overall WFL for tasks assessed    Cervical / Trunk Assessment Cervical / Trunk Assessment: Kyphotic  Communication   Communication: HOH  Cognition Arousal/Alertness: Awake/alert Behavior During Therapy: WFL for tasks assessed/performed Overall Cognitive Status: Within Functional Limits for tasks assessed  General Comments      Exercises     Assessment/Plan    PT Assessment Patent does not need any further PT services  PT Problem List         PT Treatment Interventions      PT Goals (Current goals can be found in the Care Plan section)  Acute Rehab PT Goals Patient Stated Goal: "to prevent another stroke." PT Goal Formulation: All assessment and education complete, DC therapy    Frequency      Barriers to discharge        Co-evaluation               AM-PAC PT "6 Clicks" Mobility  Outcome Measure Help needed turning from your back to your side while in a flat bed without using bedrails?: None Help needed moving from lying on your back to sitting on the side of a flat bed without using bedrails?: None Help needed moving to and from a bed to a chair (including a wheelchair)?: None Help needed standing up from a chair using your arms (e.g., wheelchair or bedside chair)?: None Help needed to walk in hospital room?: None Help needed climbing 3-5 steps with a railing? : None 6 Click Score: 24    End of Session   Activity Tolerance: Patient tolerated treatment well Patient left: Other (comment);with nursing/sitter in room(standing in room (pt independent)) Nurse Communication: Mobility status PT Visit Diagnosis: Other symptoms and signs involving the nervous system [id number])    Time: [id number] PT Time Calculation (min) (ACUTE ONLY): 16 min   Charges:   PT Evaluation $PT Eval Moderate Complexity: 1 Mod          Ellamae Sia, [place], DPT Acute Rehabilitation Services Pager [phone] Office [phone]   Willy Eddy 11/16/2019, 9:38 AM

## 2019-11-16 NOTE — Progress Notes (Signed)
Inpatient Diabetes Program Recommendations  AACE/ADA: New Consensus Statement on Inpatient Glycemic Control (2015)  Target Ranges:  Prepandial:   less than 140 mg/dL      Peak postprandial:   less than 180 mg/dL (1-2 hours)      Critically ill patients:  140 - 180 mg/dL   Lab Results  Component Value Date   GLUCAP 92 11/16/2019   HGBA1C 6.4 (H) 11/15/2019   Results for KEONI, CHURCHWELL" (MRN [id number]) as of 11/16/2019 09:43  Ref. Range 11/15/2019 10:14 11/15/2019 12:09 11/15/2019 17:04 11/16/2019 06:13  Glucose-Capillary Latest Ref Range: 70 - 99 mg/dL 353 (H) 341 (H) 263 (H) 92   Review of Glycemic Control  Diabetes history: type 2 Outpatient Diabetes medications: Lantus 14 U at breakfast, 12 units at HS, Novolog 10 U TID with meals Current orders for Inpatient glycemic control: Lantus 14 U at breakfast, 12 units at HS, Novolog MODERATE correction scale TID & HS  Noted that patient had a lab glucose this am of 56 mg/dl at 0343.  Fasting CBG was 92 mg/dl. Recommend decreasing Lantus dosages by 20%: Lantus 12 units at breakfast, Lantus 10 units at HS if blood sugars continue to be less than 100 mg/dl.    Harvel Ricks RN BSN CDE Diabetes Coordinator Pager: [phone]  8am-5pm  Inpatient Diabetes Program Recommendations:

## 2019-11-17 ENCOUNTER — Encounter: Payer: Self-pay | Admitting: *Deleted

## 2019-11-17 ENCOUNTER — Other Ambulatory Visit: Payer: Self-pay | Admitting: *Deleted

## 2019-11-17 NOTE — Patient Outreach (Addendum)
[facility]) Care Management Chronic Special Needs Program  11/17/2019  Name: Curtis Dunn DOB: 21-Dec-1940  MRN: [id number]  Curtis Dunn is enrolled in a chronic special needs plan for Diabetes. Reviewed and updated care plan. He was hospitalized at [facility] from 12/28-12/30 for large right MCA acute ischemic infarct and left PCA territory subacute large infarct with mild petechial hemorrhage without mass-effect. Reached Curtis Dunn via cell phone to advise him of [facility] Stroke follow up calls and to complete transition of care call. See transition of care template and care plan for details.   Subjective: Curtis Dunn states he is doing well, has no neurological deficits and says he is working to get his blood sugar back in good control as he says it was not in good control while he was hospitalized.  He says his daughter and his friend [name] of 18 years from [place] insisted he go to the emergency room when his left hand became numb and "totally paralyzed". He says he was taking all of his medications as directed.    Goals Addressed            This Visit's Progress   . Client will not report change from baseline and no repeated symptoms of stroke with in the next 1-2 months       11/17/19 reviewed discharge instructions with client and positive reinforcement given to client for seeking emergency help when he recognized neurological deficits, educated him to the telephonic [facility] Stroke Program     . Client will report no fall or injuries in the next 1-2 months.       11/17/19 Assessed client's ability to ambulate without assistance upon discharge from hospital    . Client will report no worsening of symptoms of Atrial Fibrillation within the next 4 months   On track    21/31/20 Hospital discharge summary indicates client was not in atrial fibrillation, his therapy was changed from Xarelto to Eliquis upon discharge from the hospital Verified  that client understands anticoagulation treatment with both Eliquis and low dose aspirin     . Client will verbalize knowledge of self management of Hypertension as evidences by BP reading of 140/90 or less; or as defined by provider       Home monitored and provider visit BP readings are meeting targets consistently Client is adherent with blood pressure medicines 11/17/19 client was not hypertensive when he presented to the hospital on 11/14/19 with neuro deficits    . General - Client will not be readmitted within 30 days (C-SNP)       11/17/19 Client was hospitalized from 12/28-12/30 with acute stroke, discharge instructions reviewed with client on 11/17/19 and advised him that he was enrolled in the [facility] Stroke program and will receive automated calls soon after he is discharged from the hospital    . HEMOGLOBIN A1C < 7.0       Hgb A1C is consistently below 7 Hgb A1C was 6.4% on 11/15/19 while hospitalized     . Maintain timely refills of diabetic medication as prescribed within the year .   On track    Dispense history indicates client maintains timely refills Client states he maintains timely refills of all of his medications    . Obtain annual  Lipid Profile, LDL-C   On track    Lipid profile was drawn on 05/16/19 - all elements met target Lipid panel was drawn on 11/15/19 while hospitalized- all elements met target    .  Obtain Annual Eye (retinal)  Exam    On track    Client had retinal eye exam on 07/14/19 - client states not evidence of diabetic retinopathy    . Obtain Annual Foot Exam   On track    Diabetic foot exam was completed on 01/20/19 and on 10/04/19    . Obtain Hemoglobin A1C at least 2 times per year   On track    Most recent Hgb A1C was checked 11/15/19 while hospitalized, prior to that it was checked on 10/04/19 and on 05/16/19 , it is checked every 4 -5 months    . Visit Primary Care Provider or Endocrinologist at least 2 times per year    On track    Last visit  with Dr. Forde Dandy was 10/04/19, also completed an appointment on 06/02/19     Assessment: Client hospitalized form 12/28-12/30/20 with diagnosis of large right MCA acute ischemic infarct and left PCA territory subacute large infarct with mild petechial hemorrhage without mass-effect. No residual neurological deficits and no need for home health or outpatient therapy. Client is meeting diabetes self-management goal of hemoglobin A1C of <7% with most recent reading of 6.4%% on 11/15/19 with some episodes of hypoglycemia while hospitalized. Client has good understanding of:  COVID-19 cause, symptoms, precautions (social distancing, stay at home order, hand washing, wear face covering when unable to maintain or ensure 6 foot social distancing), and symptoms requiring provider notification.  Plan:   Send successful outreach letter with a copy of their individualized care plan Send individual care plan to provider Chronic care management coordinator will outreach in:  1 month  Kelli Churn RN, CCM, [place] Management Coordinator [place] [facility] [phone]

## 2019-11-21 DIAGNOSIS — H903 Sensorineural hearing loss, bilateral: Secondary | ICD-10-CM | POA: Diagnosis not present

## 2019-11-21 DIAGNOSIS — H6240 Otitis externa in other diseases classified elsewhere, unspecified ear: Secondary | ICD-10-CM | POA: Diagnosis not present

## 2019-11-21 DIAGNOSIS — B369 Superficial mycosis, unspecified: Secondary | ICD-10-CM | POA: Diagnosis not present

## 2019-11-24 ENCOUNTER — Other Ambulatory Visit: Payer: Self-pay | Admitting: Nurse Practitioner

## 2019-11-29 ENCOUNTER — Encounter: Payer: Self-pay | Admitting: Cardiology

## 2019-11-29 ENCOUNTER — Ambulatory Visit (INDEPENDENT_AMBULATORY_CARE_PROVIDER_SITE_OTHER): Payer: HMO | Admitting: Cardiology

## 2019-11-29 ENCOUNTER — Other Ambulatory Visit: Payer: Self-pay

## 2019-11-29 VITALS — BP 110/54 | HR 72 | Ht 71.0 in | Wt 156.0 lb

## 2019-11-29 DIAGNOSIS — I480 Paroxysmal atrial fibrillation: Secondary | ICD-10-CM

## 2019-11-29 DIAGNOSIS — Z7901 Long term (current) use of anticoagulants: Secondary | ICD-10-CM

## 2019-11-29 DIAGNOSIS — I10 Essential (primary) hypertension: Secondary | ICD-10-CM

## 2019-11-29 DIAGNOSIS — I639 Cerebral infarction, unspecified: Secondary | ICD-10-CM

## 2019-11-29 NOTE — Patient Instructions (Signed)
Medication Instructions:  The current medical regimen is effective;  continue present plan and medications.  *If you need a refill on your cardiac medications before your next appointment, please call your pharmacy*  Follow-Up: At [facility], you and your health needs are our priority.  As part of our continuing mission to provide you with exceptional heart care, we have created designated Provider Care Teams.  These Care Teams include your primary Cardiologist (physician) and Advanced Practice Providers (APPs -  Physician Assistants and Nurse Practitioners) who all work together to provide you with the care you need, when you need it.  Your next appointment:   6 month(s)  The format for your next appointment:   In Person  Provider:   Truitt Merle, NP and 1 year with Dr. [name].  Thank you for choosing [facility]!!

## 2019-11-29 NOTE — Progress Notes (Signed)
PCP:  Reynold Bowen, MD  Cardiologist:  [name], MD  Electrophysiologist:  None   Evaluation Performed:  Follow-Up Visit  Chief Complaint: Gout  History of Present Illness:    Curtis Dunn is a 79 y.o. male with paroxysmal atrial fibrillation during a nuclear stress test 2006 no ischemia.  Here for follow-up of paroxysmal atrial fibrillation.  Former Emergency planning/management officer.  He was hospitalized in review of medical records and discharge on 10/20/2019 after experiencing numbness in the left hand diagnosed with a large right MCA acute ischemic infarct and left PCA territory subacute large infarct with mild petechial hemorrhage without mass-effect.  60% right and 50% left carotid stenosis.  LDL 68 hemoglobin A1c 6.4 echocardiogram was overall functioning normally.  No masses.  Xarelto was initially held due to petechial hemorrhages but then he was switched to Eliquis on 12/31 evening.  Symptoms resolved.  Also on statin and aspirin.  Follow-up with neurology.  His atrial fibrillation was well rate controlled in sinus rhythm.  Full dose of Eliquis switch during hospital.  Previously on Xarelto.  Overall been doing fairly well.  Residual symptoms are none.  No fevers chills nausea vomiting chest pain bleeding.  Various orthopedic issues.  Last EF 65% on echo.  GOUT - off of HCTZ.   Right side ankle Lasix working for edema.     Past Medical History:  Diagnosis Date  . Broken neck ([place])    in March 2015  . Cataract   . Colon cancer ([place])   . Colon polyp   . Diabetes mellitus    Type 1  . Dysrhythmia    a fib  . Gout   . Hyperlipidemia   . Hypertension   . Lipoma of back   . Paroxysmal atrial fibrillation [facility])    Past Surgical History:  Procedure Laterality Date  . ANKLE SURGERY     right  . APPENDECTOMY  2001  . arm surgery     right shoulder surgery  . CATARACT EXTRACTION EXTRACAPSULAR Bilateral   . COLON RESECTION    . ELBOW FRACTURE SURGERY    . INGUINAL  HERNIA REPAIR Left 03/13/2016   Procedure: LEFT INGUINAL HERNIA REPAIR WITH UNBILICAL HERNIA REPAIR;  Surgeon: [name], MD;  Location: [place];  Service: General;  Laterality: Left;  . INSERTION OF MESH Left 03/13/2016   Procedure: INSERTION OF MESH;  Surgeon: [name], MD;  Location: [place];  Service: General;  Laterality: Left;  [name] MASS EXCISION N/A 07/20/2019   Procedure: EXCISION OF BACK MASS;  Surgeon: Rolm Bookbinder, MD;  Location: [place];  Service: General;  Laterality: N/A;  MAC AND LOCAL  . VASECTOMY       Current Meds  Medication Sig  . alfuzosin (UROXATRAL) 10 MG 24 hr tablet Take 10 mg by mouth daily with breakfast.  . allopurinol (ZYLOPRIM) 100 MG tablet Take 100 mg by mouth daily.  [name] amLODipine (NORVASC) 5 MG tablet Take 1 tablet by mouth once daily  . apixaban (ELIQUIS) 5 MG TABS tablet Take 1 tablet (5 mg total) by mouth 2 (two) times daily.  . Ascorbic Acid (VITAMIN C) 1000 MG tablet Take 1,000 mg by mouth daily.  [name] aspirin EC 81 MG EC tablet Take 1 tablet (81 mg total) by mouth daily.  . Cholecalciferol (VITAMIN D3) 250 MCG (10000 UT) capsule Take 1 capsule by mouth daily.   . Coenzyme Q-10 200 MG CAPS Take 1 capsule by mouth daily.  [name]  Continuous Blood Gluc Sensor (FREESTYLE LIBRE 14 DAY SENSOR) MISC USE TO MONITOR BLOOD GLUCOSE. CHANGE EVERY 14 DAYS.  [name] Cyanocobalamin 5000 MCG TBDP Take 1 tablet by mouth daily.  . furosemide (LASIX) 40 MG tablet Take 20 mg by mouth 2 (two) times daily.   [name] Hawthorne Berry 550 MG CAPS Take 550 mg by mouth daily.  . hydrALAZINE (APRESOLINE) 50 MG tablet Take 1 tablet (50 mg total) by mouth 2 (two) times daily.  . insulin aspart (NOVOLOG) 100 UNIT/ML injection Inject 10 Units into the skin 3 (three) times daily before meals.   . insulin glargine (LANTUS) 100 UNIT/ML injection Inject 0.12 mLs (12 Units total) into the skin 2 (two) times daily. Inject 12 units at breakfast and 10 units at bedtime  . lovastatin  (MEVACOR) 20 MG tablet Take 20 mg by mouth at bedtime.  . Magnesium ([employer] TRIPLE MAGNESIUM COMPLEX) 400 MG CAPS Take 1 capsule by mouth daily.  . metoprolol succinate (TOPROL-XL) 25 MG 24 hr tablet Take 1 tablet by mouth once daily  . Misc Natural Products (BETA-SITOSTEROL PLANT STEROLS PO) Take 1 tablet by mouth 2 (two) times a day. PRODUCT NAME: SUPER BETA PROSTATE (beta sitosterol, lycopene, reishi mushroom)  . Nutritional Supplements (DHEA PO) Take 100 mg by mouth daily.  . Nutritional Supplements (PYCNOGENOL PO) Take 100 mg by mouth daily.  . Omega 3 1200 MG CAPS Take 1 capsule by mouth daily.  . Potassium 99 MG TABS Take 1 tablet by mouth daily.   . ramipril (ALTACE) 10 MG capsule Take 1 capsule by mouth twice daily  . vitamin E 1000 UNIT capsule Take 1,000 Units by mouth daily.   . Zinc 50 MG CAPS Take 1 tablet by mouth daily.      Allergies:   Patient has no known allergies.   Social History   Tobacco Use  . Smoking status: Never Smoker  . Smokeless tobacco: Never Used  . Tobacco comment: occasional cigar  Substance Use Topics  . Alcohol use: Yes    Comment: rare  . Drug use: No     Family Hx: The patient's family history includes Heart disease in his father. There is no history of Colon cancer, Esophageal cancer, Rectal cancer, or Stomach cancer.  ROS:   Please see the history of present illness.     All other systems reviewed and are negative.   Prior CV studies:   The following studies were reviewed today:  ECHO 11/15/19  1. Left ventricular ejection fraction, by visual estimation, is 60 to 65%. The left ventricle has normal function. There is no left ventricular hypertrophy.  2. The left ventricle has no regional wall motion abnormalities.  3. Global right ventricle has normal systolic function.The right ventricular size is normal. No increase in right ventricular wall thickness.  4. Left atrial size was normal.  5. Right atrial size was normal.  6. Trivial  pericardial effusion is present.  7. Mild mitral annular calcification.  8. The mitral valve is normal in structure. Trivial mitral valve regurgitation. No evidence of mitral stenosis.  9. The tricuspid valve is normal in structure. 10. The aortic valve is normal in structure. Aortic valve regurgitation is trivial. No evidence of aortic valve sclerosis or stenosis. 11. The pulmonic valve was normal in structure. Pulmonic valve regurgitation is not visualized. 12. TR signal is inadequate for assessing pulmonary artery systolic pressure. 13. The inferior vena cava is normal in size with greater than 50% respiratory variability, suggesting right  atrial pressure of 3 mmHg.  Labs/Other Tests and Data Reviewed:    EKG:  No new  Sinus 55 no other significant abnormalities on 11/06/2019.  Recent Labs: 11/14/2019: ALT 20 11/16/2019: BUN 22; Creatinine, Ser 1.23; Hemoglobin 12.4; Platelets 172; Potassium 3.7; Sodium 143   Recent Lipid Panel Lab Results  Component Value Date/Time   CHOL 126 11/15/2019 05:00 AM   TRIG 62 11/15/2019 05:00 AM   HDL 46 11/15/2019 05:00 AM   CHOLHDL 2.7 11/15/2019 05:00 AM   LDLCALC 68 11/15/2019 05:00 AM    Wt Readings from Last 3 Encounters:  11/29/19 156 lb (70.8 kg)  11/15/19 152 lb 1.9 oz (69 kg)  07/20/19 160 lb 7.9 oz (72.8 kg)     Objective:    Vital Signs:  BP (!) 110/54   Pulse 72   Ht _0  (1.803 m)   Wt 156 lb (70.8 kg)   SpO2 97%   BMI 21.76 kg/m    GEN: Well nourished, well developed, in no acute distress  HEENT: normal  Neck: no JVD, carotid bruits, or masses Cardiac: RRR; no murmurs, rubs, or gallops,no edema  Respiratory:  clear to auscultation bilaterally, normal work of breathing GI: soft, nontender, nondistended, + BS MS: no deformity or atrophy  Skin: warm and dry, no rash Neuro:  Alert and Oriented x 3, Strength and sensation are intact Psych: euthymic mood, full affect    Gout noted right ankle ASSESSMENT & PLAN:      Stroke 10/2019  - Neuro follow March. On ASA and Eliquis now.  Question about long-term aspirin use with Eliquis.  Defer to neurology.  Gout  -Recent events.  Previous gout event was "Terrible". Right foot. Dr. Forde Dandy. Dr. Marga Melnick saw as well. Plate and screws in right ankle. No fracture. Did not think cellulitis. Dx with gout. No ETOH. Rare meat. Uric acid 8.  No longer on HCTZ.  Taking furosemide for lower extremity edema. Continue with Dr. Forde Dandy plan.   PAF -Sinus rhythm last check.  Switched from Xarelto to Eliquis secondary to stroke while on Xarelto.  Continue to monitor hemoglobin and creatinine.  Last creatinine 1.23, hemoglobin 12.4.  Diabetes with hypertension -Followed by Dr. Carrolyn Meiers.  Last hemoglobin A1c 6.4.  He was asking about sliding scale insulin dosing in the hospital.  Glucose was high 1 point.  Trauma from hypoglycemic fall with flail chest ankle fracture AKI in the past.  Susie, friend from [place] helped him out.  Hyperlipidemia -Statin therapy.  No changes made.  Continue with lovastatin.  66-monthfollow-up, APP, 149-monthe  Medication Adjustments/Labs and Tests Ordered: Current medicines are reviewed at length with the patient today.  Concerns regarding medicines are outlined above.   Tests Ordered: No orders of the defined types were placed in this encounter.   Medication Changes: No orders of the defined types were placed in this encounter.  Signed, MaCandee FurbishMD  11/29/2019 10:17 AM    [facility]

## 2019-12-15 ENCOUNTER — Other Ambulatory Visit: Payer: Self-pay | Admitting: *Deleted

## 2019-12-15 ENCOUNTER — Ambulatory Visit: Payer: Self-pay | Admitting: *Deleted

## 2019-12-15 NOTE — Patient Outreach (Signed)
  [facility]) Care Management Chronic Special Needs Program    12/15/2019  Name: ROYALE HANKINS, DOB: 24-Jun-1941  MRN: [id number]   Mr. Ziyah Setterlund is enrolled in a chronic special needs plan for Diabetes. Attempted to reach Mr. Peregrino via mobile number to complete follow up assessment; no answer; left HIPAA compliant message requesting return call.  Plan: If client does not return call, will make second outreach call within one week.  Kelli Churn RN, CCM, [facility] Care Management [phone]

## 2019-12-16 ENCOUNTER — Other Ambulatory Visit: Payer: Self-pay | Admitting: *Deleted

## 2019-12-16 NOTE — Patient Outreach (Signed)
  [place] [facility]) Care Management Chronic Special Needs Program    12/16/2019  Name: NICOLIS HEASTER, DOB: [birth year]  MRN: [id number]   Mr. Larren Playford is enrolled in a chronic special needs plan for Diabetes. Returned call to client after he left voice message for this RNCM on 12/15/19 at 5:32 pm in response to message left for him earlier yesterday requesting call back. No answer, left HIPAA compliant voice message requesting return call.  Plan: WIll make a 3rd outreach to client within one week if he does not return call.  Kelli Churn RN, CCM, [place] Management Coordinator [facility] Care Management [phone]

## 2019-12-20 NOTE — Progress Notes (Signed)
[facility] [address] [place] [address] [phone]       HOSPITAL FOLLOW UP NOTE  [name] Date of Birth:  1941/07/23 Medical Record Number:  [id number]   Reason for Referral:  hospital stroke follow up    CHIEF COMPLAINT:  Chief Complaint  Patient presents with  . Hospitalization Follow-up    Alone. Treatment room. He would like to discuss his possibility of having another stroke.    HPI: Curtis Dunn being seen today for in office hospital follow-up regarding acute right frontal ocular MCA and subacute left PCA infarct occipital lobe secondary to known A. fib on lower dose of Xarelto on 11/14/2019.  History obtained from patient and chart review. Reviewed all radiology images and labs personally.  [name] is a 79 y.o. male with history of PAF on Xarelto, DM type I, HTN and HLD  who presented on 11/14/2019 with L hand numbness, weakness and incoordination.  Evaluated by stroke team and Dr. Erlinda Hong with stroke work-up revealing acute right frontal opercular MCA and subacute left PCA infarct occipital lobe as evidenced on MRI secondary to known A. fib on lower dose of Xarelto.  In addition to acute infarcts, MRI also showed mild petechial hemorrhage without mass-effect, small vessel disease and old left parietal and right cerebellar infarcts.  CTA head/neck negative LVO with right ICA 60% stenosis and left ICA 50% stenosis along with ICA siphon arthrosclerosis with moderate narrowing, B V4 arthrosclerosis with plaque, moderate left and severe on the right.  2D echo showed an EF of 60 to 65%.  Xarelto PTA initially held due to petechial hemorrhages and recommended switching to Eliquis 5 mg twice daily due to embolic stroke despite Xarelto use for history of atrial fibrillation and stroke prevention.  HTN stable.  LDL 68 and recommended continuation on Mevacor at discharge.  Type I DM controlled with A1c 6.4.  Other stroke risk  factors include advanced age, EtOH use and prior stroke on imaging.  Other active problems include CKD stage IIIa, history of gout, history of colon cancer and history of cervical fracture 4 years prior.  He was eventually discharged home on 11/16/2019 in stable condition.  Curtis Dunn is a 79 year old right handed male who is being seen today for hospital follow up. Residual stroke deficits of mild decreased left hand dexterity but greatly improving.  He has returned back to all prior activities without difficulty.  He routinely exercises at his local gym 3 days weekly.  He has remained on Eliquis 5 mg twice daily without bleeding or bruising.  Continues on Mevacor without myalgias.  Blood pressure today satisfactory 126/70.  Glucose levels have been stable.  He continues to follow with PCP for HTN, HLD and DM.  Denies new or concerning stroke/TIA symptoms.  No further concerns at this time.      ROS:   14 system review of systems performed and negative with exception of weakness and hearing loss  PMH:  Past Medical History:  Diagnosis Date  . Broken neck ([place])    in March 2015  . Cataract   . Colon cancer ([place])   . Colon polyp   . Diabetes mellitus    Type 1  . Dysrhythmia    a fib  . Gout   . Hyperlipidemia   . Hypertension   . Lipoma of back   . Paroxysmal atrial fibrillation ([facility])     PSH:  Past Surgical History:  Procedure Laterality Date  . ANKLE SURGERY     right  . APPENDECTOMY  2001  . arm surgery     right shoulder surgery  . CATARACT EXTRACTION EXTRACAPSULAR Bilateral   . COLON RESECTION    . ELBOW FRACTURE SURGERY    . INGUINAL HERNIA REPAIR Left 03/13/2016   Procedure: LEFT INGUINAL HERNIA REPAIR WITH UNBILICAL HERNIA REPAIR;  Surgeon: [name], MD;  Location: [facility];  Service: General;  Laterality: Left;  . INSERTION OF MESH Left 03/13/2016   Procedure: INSERTION OF MESH;  Surgeon: [name], MD;  Location: [facility];  Service: General;  Laterality: Left;    [name] MASS EXCISION N/A 07/20/2019   Procedure: EXCISION OF BACK MASS;  Surgeon: Rolm Bookbinder, MD;  Location: [facility];  Service: General;  Laterality: N/A;  MAC AND LOCAL  . VASECTOMY      Social History:  Social History   Socioeconomic History  . Marital status: Widowed    Spouse name: Not on file  . Number of children: 2  . Years of education: Not on file  . Highest education level: Not on file  Occupational History  . 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 Smoking status: Never Smoker  . Smokeless tobacco: Never Used  . Tobacco comment: occasional cigar  Substance and Sexual Activity  . Alcohol use: Yes    Comment: rare  . Drug use: No  . Sexual activity: Not on file  Other Topics Concern  . Not on file  Social History Narrative   [place] [address] [place]   Has one cat named Mousse   Has one daughter and one son. Daughter lives in [place]   Social Determinants of Health   Financial Resource Strain: Low Risk   . Difficulty of Paying Living Expenses: Not hard at all  Food Insecurity: No Food Insecurity  . Worried About Charity fundraiser in the Last Year: Never true  . Ran Out of Food in the Last Year: Never true  Transportation Needs: No Transportation Needs  . Lack of Transportation (Medical): No  . Lack of Transportation (Non-Medical): No  Physical Activity: Sufficiently Active  . Days of Exercise per Week: 3 days  . Minutes of Exercise per Session: 120 min  Stress: No Stress Concern Present  . Feeling of Stress : Only a little  Social Connections: Unknown  . Frequency of Communication with Friends and Family: More than three times a week  . Frequency of Social Gatherings with Friends and Family: Three times a week  . Attends Religious Services: Not on file  . Active Member of Clubs or Organizations: Yes  . Attends Archivist Meetings: More than 4 times per year   . Marital Status: Widowed  Intimate Partner Violence: Not At Risk  . Fear of Current or Ex-Partner: No  . Emotionally Abused: No  . Physically Abused: No  . Sexually Abused: No    Family History:  Family History  Problem Relation Age of Onset  . Heart disease Father   . Colon cancer Neg Hx   . Esophageal cancer Neg Hx   . Rectal cancer Neg Hx   . Stomach cancer Neg Hx     Medications:   Current Outpatient Medications on File Prior to Visit  Medication Sig Dispense Refill  . alfuzosin (UROXATRAL) 10 MG 24 hr tablet Take 10 mg by mouth  daily with breakfast.    . allopurinol (ZYLOPRIM) 100 MG tablet Take 100 mg by mouth daily.    [name] amLODipine (NORVASC) 5 MG tablet Take 1 tablet by mouth once daily 90 tablet 3  . apixaban (ELIQUIS) 5 MG TABS tablet Take 1 tablet (5 mg total) by mouth 2 (two) times daily. 60 tablet 1  . Ascorbic Acid (VITAMIN C) 1000 MG tablet Take 1,000 mg by mouth daily.    [name] aspirin EC 81 MG EC tablet Take 1 tablet (81 mg total) by mouth daily. 60 tablet 0  . Cholecalciferol (VITAMIN D3) 250 MCG (10000 UT) capsule Take 1 capsule by mouth daily.     . Coenzyme Q-10 200 MG CAPS Take 1 capsule by mouth daily.    . Continuous Blood Gluc Sensor (FREESTYLE LIBRE 14 DAY SENSOR) MISC USE TO MONITOR BLOOD GLUCOSE. CHANGE EVERY 14 DAYS.    [name] Cyanocobalamin 5000 MCG TBDP Take 1 tablet by mouth daily.    . furosemide (LASIX) 40 MG tablet Take 20 mg by mouth 2 (two) times daily.     [name] Hawthorne Berry 550 MG CAPS Take 550 mg by mouth daily.  0  . hydrALAZINE (APRESOLINE) 50 MG tablet Take 1 tablet (50 mg total) by mouth 2 (two) times daily. 180 tablet 3  . insulin aspart (NOVOLOG) 100 UNIT/ML injection Inject 10 Units into the skin 3 (three) times daily before meals.     . insulin glargine (LANTUS) 100 UNIT/ML injection Inject 0.12 mLs (12 Units total) into the skin 2 (two) times daily. Inject 12 units at breakfast and 10 units at bedtime (Patient taking differently: Inject 12  Units into the skin 2 (two) times daily. Inject 14 units at breakfast and 12 units at bedtime) 10 mL 11  . lovastatin (MEVACOR) 20 MG tablet Take 20 mg by mouth at bedtime.    . Magnesium ([employer]S TRIPLE MAGNESIUM COMPLEX) 400 MG CAPS Take 1 capsule by mouth daily.    . metoprolol succinate (TOPROL-XL) 25 MG 24 hr tablet Take 1 tablet by mouth once daily 90 tablet 3  . Misc Natural Products (BETA-SITOSTEROL PLANT STEROLS PO) Take 1 tablet by mouth 2 (two) times a day. PRODUCT NAME: SUPER BETA PROSTATE (beta sitosterol, lycopene, reishi mushroom)    . Nutritional Supplements (DHEA PO) Take 100 mg by mouth daily.    . Nutritional Supplements (PYCNOGENOL PO) Take 100 mg by mouth daily.    . Omega 3 1200 MG CAPS Take 1 capsule by mouth daily.    . Potassium 99 MG TABS Take 1 tablet by mouth daily.     . ramipril (ALTACE) 10 MG capsule Take 1 capsule by mouth twice daily 180 capsule 2  . vitamin E 1000 UNIT capsule Take 1,000 Units by mouth daily.     . Zinc 50 MG CAPS Take 1 tablet by mouth daily.      No current facility-administered medications on file prior to visit.    Allergies:  No Known Allergies   Physical Exam  Vitals:   12/21/19 0752  BP: 126/70  Pulse: 62  Temp: 97.7 F (36.5 C)  TempSrc: Oral  Weight: 160 lb (72.6 kg)  Height: _0  (1.803 m)   Body mass index is 22.32 kg/m. No exam data present  Depression screen [facility] 2/9 12/21/2019  Decreased Interest 0  Down, Depressed, Hopeless 0  PHQ - 2 Score 0     General: well developed, well nourished,  pleasant elderly Caucasian male, seated,  in no evident distress Head: head normocephalic and atraumatic.   Neck: supple with no carotid or supraclavicular bruits Cardiovascular: regular rate and rhythm, no murmurs Musculoskeletal: no deformity Skin:  no rash/petichiae Vascular:  Normal pulses all extremities   Neurologic Exam Mental Status: Awake and fully alert.   Normal speech and language.  Oriented to place and time.  Recent and remote memory intact. Attention span, concentration and fund of knowledge appropriate. Mood and affect appropriate.  Cranial Nerves: Fundoscopic exam reveals sharp disc margins. Pupils equal, briskly reactive to light. Extraocular movements full without nystagmus. Visual fields full to confrontation. HOH. Facial sensation intact. Face, tongue, palate moves normally and symmetrically.  Motor: Normal bulk and tone. Normal strength in all tested extremity muscles except mild left grip weakness and decreased dexterity. Sensory.: intact to touch , pinprick , position and vibratory sensation.  Coordination: Rapid alternating movements normal in all extremities except mild decreased left hand dexterity. Finger-to-nose and heel-to-shin performed accurately bilaterally. Gait and Station: Arises from chair without difficulty. Stance is normal. Gait demonstrates normal stride length and balance Reflexes: 1+ and symmetric. Toes downgoing.     NIHSS  0 Modified Rankin  1 CHA2DS2-VASc 6 HAS-BLED 2   Diagnostic Data (Labs, Imaging, Testing)  CT HEAD WO CONTRAST 11/14/2019 IMPRESSION: Mild diffuse atrophy, slightly increased from prior study. Suspect recent and potentially acute infarct in the posterior mid right cerebellum. There is decreased attenuation in the dentate nucleus of the right cerebellum compared to the left side which may represent a second nearby focus of recent and possibly acute infarct. Elsewhere there is mild periventricular small vessel disease. No mass or hemorrhage.  There are foci of arterial vascular calcification. There is multifocal paranasal sinus disease. There is probable cerumen in the right external auditory canal.  CT ANGIO HEAD W OR WO CONTRAST CT ANGIO NECK W OR WO CONTRAST 11/14/2019 IMPRESSION: 1. Negative CTA for large vessel occlusion. 2. Atheromatous plaque about the carotid bifurcations with associated stenoses of up to 60% on the right and  50% on the left. 3. Atherosclerotic change throughout the carotid siphons with associated moderate diffuse narrowing. 4. Atheromatous plaque involving the V4 segments bilaterally with associated stenoses, moderate on the left, severe on the right. Left vertebral artery is dominant.  MR BRAIN WO CONTRAST 11/15/2019 IMPRESSION: 1. 3.8 cm acute ischemic nonhemorrhagic right MCA territory infarct involving the right frontal operculum. No associated mass effect. 2. Additional 2.8 cm early subacute left PCA territory infarct involving the parasagittal left occipital lobe. Associated mild petechial hemorrhage without mass effect. 3. Underlying age-related cerebral atrophy with chronic left parietal and right cerebellar infarcts.  ECHOCARDIOGRAM 11/15/2019 IMPRESSIONS  1. Left ventricular ejection fraction, by visual estimation, is 60 to  65%. The left ventricle has normal function. There is no left ventricular  hypertrophy.  2. The left ventricle has no regional wall motion abnormalities.  3. Global right ventricle has normal systolic function.The right  ventricular size is normal. No increase in right ventricular wall  thickness.  4. Left atrial size was normal.  5. Right atrial size was normal.  6. Trivial pericardial effusion is present.  7. Mild mitral annular calcification.  8. The mitral valve is normal in structure. Trivial mitral valve  regurgitation. No evidence of mitral stenosis.  9. The tricuspid valve is normal in structure.  10. The aortic valve is normal in structure. Aortic valve regurgitation is  trivial. No evidence of aortic valve sclerosis or stenosis.  11. The pulmonic valve  was normal in structure. Pulmonic valve  regurgitation is not visualized.  12. TR signal is inadequate for assessing pulmonary artery systolic  pressure.  13. The inferior vena cava is normal in size with greater than 50%  respiratory variability, suggesting right atrial pressure  of 3 mmHg.    ASSESSMENT: Curtis Dunn is a 79 y.o. year old male presented with left hand numbness, weakness and incoordination on 11/14/2019 with stroke work-up revealing acute right frontal ocular MCA and subacute left PCA infarct occipital lobe with mild petechial hemorrhage secondary to known A. fib on lower dose of Xarelto.  Recommended switching anticoagulation to Eliquis 5 mg twice daily.  Vascular risk factors include atrial fibrillation, DM type I, HLD, HTN, prior stroke on imaging and advanced age.  Residual deficits of mild left hand dexterity with ongoing improvement    PLAN:  1. Right MCA and left PCA stroke: Continue Eliquis (apixaban) daily  and Mevacor for secondary stroke prevention. Maintain strict control of hypertension with blood pressure goal below 130/90, diabetes with hemoglobin A1c goal below 6.5% and cholesterol with LDL cholesterol (bad cholesterol) goal below 70 mg/dL.  I also advised the patient to eat a healthy diet with plenty of whole grains, cereals, fruits and vegetables, exercise regularly with at least 30 minutes of continuous activity daily and maintain ideal body weight. 2. Atrial fibrillation: Continuation of Eliquis 5 mg twice daily and ongoing follow-up with cardiology for monitoring and management 3. HTN: Advised to continue current treatment regimen.  Today's BP stable.  Advised to continue to monitor at home along with continued follow-up with PCP for management 4. HLD: Advised to continue current treatment regimen along with continued follow-up with PCP for future prescribing and monitoring of lipid panel 5. DM type I: Advised to continue to monitor glucose levels at home along with continued follow-up with PCP for management and monitoring 6. Diminished left hand dexterity, poststroke: Provided patient with exercises he can do at home for ongoing improvement.  No indication or benefit from therapy at this time    Follow up in 4 months or call  earlier if needed   Greater than 50% of time during this 45 minute visit was spent on counseling, explanation of diagnosis of right MCA and left PCA stroke, reviewing risk factor management of atrial fibrillation, HTN, HLD, DM type I, planning of further management along with potential future management, and discussion with patient answering all questions to satisfaction    Frann Rider, AGNP-BC  [facility] [address] [place], [place] [address]  Phone [phone] Fax [phone] Note: This document was prepared with digital dictation and possible smart phrase technology. Any transcriptional errors that result from this process are unintentional.

## 2019-12-21 ENCOUNTER — Other Ambulatory Visit: Payer: Self-pay

## 2019-12-21 ENCOUNTER — Encounter: Payer: Self-pay | Admitting: Adult Health

## 2019-12-21 ENCOUNTER — Ambulatory Visit: Payer: HMO | Admitting: Adult Health

## 2019-12-21 VITALS — BP 126/70 | HR 62 | Temp 97.7°F | Ht 71.0 in | Wt 160.0 lb

## 2019-12-21 DIAGNOSIS — I639 Cerebral infarction, unspecified: Secondary | ICD-10-CM

## 2019-12-21 DIAGNOSIS — E785 Hyperlipidemia, unspecified: Secondary | ICD-10-CM

## 2019-12-21 DIAGNOSIS — I10 Essential (primary) hypertension: Secondary | ICD-10-CM | POA: Diagnosis not present

## 2019-12-21 DIAGNOSIS — I480 Paroxysmal atrial fibrillation: Secondary | ICD-10-CM

## 2019-12-21 DIAGNOSIS — E109 Type 1 diabetes mellitus without complications: Secondary | ICD-10-CM

## 2019-12-21 NOTE — Progress Notes (Signed)
I agree with the above plan 

## 2019-12-21 NOTE — Patient Instructions (Signed)
Continue Eliquis (apixaban) daily  and Mevacor  for secondary stroke prevention  Continue to follow up with PCP regarding cholesterol, diabetes and blood pressure management   Continue to follow up with cardiology for atrial fibrillation  Continue to exercise along with doing left hand dexterity   Continue to monitor blood pressure at home  Maintain strict control of hypertension with blood pressure goal below 130/90, diabetes with hemoglobin A1c goal below 6.5% and cholesterol with LDL cholesterol (bad cholesterol) goal below 70 mg/dL. I also advised the patient to eat a healthy diet with plenty of whole grains, cereals, fruits and vegetables, exercise regularly and maintain ideal body weight.  Followup in the future with me in 4 months or call earlier if needed       Thank you for coming to see [place] at [facility]. I hope we have been able to provide you high quality care today.  You may receive a patient satisfaction survey over the next few weeks. We would appreciate your feedback and comments so that we may continue to improve ourselves and the health of our patients.

## 2019-12-22 ENCOUNTER — Encounter: Payer: Self-pay | Admitting: *Deleted

## 2019-12-22 ENCOUNTER — Other Ambulatory Visit: Payer: Self-pay | Admitting: *Deleted

## 2019-12-22 NOTE — Patient Outreach (Signed)
[place] [facility]) Care Management Chronic Special Needs Program  12/22/2019  Name: Curtis Dunn DOB: 26-Dec-1940  MRN: [id number]  Curtis Dunn is enrolled in a chronic special needs plan for Diabetes. Reviewed and updated care plan. Successful one month post hospital discharge outreach and follow up assessment completed.  He was hospitalized from 12/28-10/2019 for large right MCA acute ischemic infarct and left PCA territory subacute large infarct with mild petechial hemorrhage without mass-effect   Subjective: Curtis Dunn states he is doing well with only mild numbness and mild decreased dexterity of left thumb, index and middle finger. He states he is doing his home exercise program for his left hand given to him at the neurologist office yesterday.   He says he is wearing his [employer] and is not interested in the newer sytem with alarms. He voiced frustration that Type 1 diabetes is very hard to control and states " I just can't figure why my blood sugars do what they do sometimes." Suggested he read the book "Stop the Rollercoaster- How to Take Charge of Your Blood Sugars in Diabetes". He says he will see Dr. Forde Dandy on 01/20/20. He continues to live alone with his cat Mousse, is independent with ADLs and IAD's' and says he does not want to purchase a home safety alert system. He says he will not receive the Covid 19 vaccine because he is concerned it will alter his DNA and he believes in "natural herd immunity" as the resolution to the pandemic.   Goals Addressed            This Visit's Progress     Patient Stated   . 'I'm concerned about the new swelling in my feet since my gout resolved" (pt-stated)       Discussed strategies to reduce lower extremity edema such as elevating feet above heart level, performing ankle pumps to promote venous return and adherence with furosemide therapy Client states he will discuss with his provider during his November  appointment 12/22/19 Lower extremity edema not discussed during telephone assessment ; neurologist NP note of 12/21/19 states client reports Lasix is controlling lower extremity edema    . COMPLETED: Ask Dr Forde Dandy about the [facility] 2 with real time glucose alarms at November office visit. (pt-stated)   Not on track    12/22/19 Client states he does not wish to pursue the [facility] 2 with the alarms, he is satisfied with his current [employer] system      Other   . COMPLETED:  Acknowledge receipt of Advanced Directive package       Client has the information but has not completed them yet    . " I want to lose 5 lbs; my goal weight is 155 lbs"       Discussed weight loss strategies. Mailed [employer] education article " Weight Loss Tips" to client's home address     . Advanced Care Planning complete by December 2021   Not on track    Client has the information but has not completed them yet    . Client understands the importance of follow-up with providers by attending scheduled visits   On track    Client consistently completes provider appointments Client completed initial appointment with neurologist on 12/21/19    . COMPLETED: Client will not report change from baseline and no repeated symptoms of stroke with in the next 1-2 months   On track    11/17/19 reviewed discharge instructions with  client including stroke prevention strategies and positive reinforcement given to client for seeking emergency help when he recognized neurological deficits, educated him to the telephonic [employer] Stroke Program  12/22/19 Client reports only mild left hand neurological deficits- positive reinforcement given to client for following home exercises provided at neurologist office on 12/21/19 Mailed [employer] education article on: "Preventing Stroke and TIA "and "Lowering the Risk of Another Stroke" to client's home address    . COMPLETED: Client will report no fall or injuries in the next 1-2 months.    On track    11/17/19 Assessed client's ability to ambulate without assistance upon discharge from hospital 12/22/19 Client reports no recent falls and neurologic deficits other than mild left hand dexterity issues, he does not wish to consider home safety alert options    . COMPLETED: Client will report no worsening of symptoms of Atrial Fibrillation within the next 4 months   On track    11/17/19 Hospital discharge summary indicates client was not in atrial fibrillation, his therapy was changed from Xarelto to Eliquis upon discharge from the hospital Verified that client understands anticoagulation treatment with both Eliquis and low dose aspirin     . Client will verbalize knowledge of self management of Hypertension as evidences by BP reading of 130/80 or less; or as defined by provider   On track    Home monitored and provider visit BP readings are meeting targets consistently Client is adherent with blood pressure medicines 11/17/19 client was not hypertensive when he presented to the hospital on 11/14/19 with neuro deficits 12/22/19 All blood pressure readings at provider's office in 2021 are meeting target of <130/<90 and client states home readings are meeting target    . COMPLETED: General - Client will not be readmitted within 30 days (C-SNP)   On track    11/17/19 Client was hospitalized from 12/28-12/30 with acute stroke, discharge instructions reviewed with client on 11/17/19 and advised him that he was enrolled in the [facility] Stroke program and will receive automated calls soon after he is discharged from the hospital 12/22/19 Client has not been readmitted to hospital    . Maintain timely refills of diabetic medication as prescribed within the year .   On track    Review of medication dispense indicates client maintains timely refills Client states he maintains timely refills of all of his medications    . Obtain annual  Lipid Profile, LDL-C- LDL goal is <70   On track    Lipid profile  was drawn on 05/16/19 - all elements met target Lipid panel was drawn on 11/15/19 while hospitalized- all elements met target, LDL= 68    . Obtain Annual Eye (retinal)  Exam    On track    Client had retinal eye exam on 07/14/19 - client states no evidence of diabetic retinopathy    . Obtain Annual Foot Exam   On track    Diabetic foot exam was completed on 01/20/19 and on 10/04/19    . Obtain annual screen for micro albuminuria (urine) , nephropathy (kidney problems)   Not on track    Per [facility] ( [employer] [facility]) most recent urine for microalbumin was completed  04/19/18    . Obtain Hemoglobin A1C at least 2 times per year   On track    Most recent Hgb A1C was checked 11/15/19 while hospitalized, prior to that it was checked on 10/04/19 and on 05/16/19 , it is checked every 4 -5 months    .  COMPLETED: Visit Primary Care Provider or Endocrinologist at least 2 times per year        Last visit with Dr. Forde Dandy was 10/04/19, also completed an appointment on 06/02/19 Client states his next appointment with Dr. Forde Dandy is 01/20/20      Assessment: Client doing well in recovering from 11/14/19 acute right middle cerebral artery stroke.  Well controlled HTN and Type 1 DM, remains in sinus rhythm per cardiology note of 11/28/18 and on Eliquis for history of atrial fibrillation.  Plan:  Chronic care management coordinator will outreach in April per routine follow up.  Kelli Churn RN, CCM, [place] Management Coordinator [facility] Care Management [phone]

## 2020-01-18 ENCOUNTER — Ambulatory Visit: Payer: Self-pay | Admitting: Pharmacist

## 2020-01-20 ENCOUNTER — Telehealth: Payer: Self-pay | Admitting: Neurology

## 2020-01-20 ENCOUNTER — Other Ambulatory Visit: Payer: Self-pay | Admitting: Pharmacist

## 2020-01-20 DIAGNOSIS — I5189 Other ill-defined heart diseases: Secondary | ICD-10-CM | POA: Diagnosis not present

## 2020-01-20 DIAGNOSIS — I480 Paroxysmal atrial fibrillation: Secondary | ICD-10-CM | POA: Diagnosis not present

## 2020-01-20 DIAGNOSIS — K8020 Calculus of gallbladder without cholecystitis without obstruction: Secondary | ICD-10-CM | POA: Diagnosis not present

## 2020-01-20 DIAGNOSIS — I779 Disorder of arteries and arterioles, unspecified: Secondary | ICD-10-CM | POA: Diagnosis not present

## 2020-01-20 DIAGNOSIS — I6340 Cerebral infarction due to embolism of unspecified cerebral artery: Secondary | ICD-10-CM | POA: Diagnosis not present

## 2020-01-20 DIAGNOSIS — C180 Malignant neoplasm of cecum: Secondary | ICD-10-CM | POA: Diagnosis not present

## 2020-01-20 DIAGNOSIS — M109 Gout, unspecified: Secondary | ICD-10-CM | POA: Diagnosis not present

## 2020-01-20 DIAGNOSIS — E1159 Type 2 diabetes mellitus with other circulatory complications: Secondary | ICD-10-CM | POA: Diagnosis not present

## 2020-01-20 DIAGNOSIS — I700 Atherosclerosis of aorta: Secondary | ICD-10-CM | POA: Diagnosis not present

## 2020-01-20 DIAGNOSIS — N1831 Chronic kidney disease, stage 3a: Secondary | ICD-10-CM | POA: Diagnosis not present

## 2020-01-20 DIAGNOSIS — Z7901 Long term (current) use of anticoagulants: Secondary | ICD-10-CM | POA: Diagnosis not present

## 2020-01-20 DIAGNOSIS — E039 Hypothyroidism, unspecified: Secondary | ICD-10-CM | POA: Diagnosis not present

## 2020-01-20 MED ORDER — APIXABAN 5 MG PO TABS: 5.0000 mg | ORAL_TABLET | Freq: Two times a day (BID) | ORAL | 11 refills | Status: DC

## 2020-01-20 NOTE — Patient Outreach (Addendum)
[place] [facility]) Care Management  01/20/2020  Curtis Dunn 10-Sep-1941 [id number]   Patient called and said he ran out of Eliquis and had been having a hard time trying to get a refill. HIPAA identifiers were obtained.   Patient said Eliquis was prescribed during his hospitalization at the end of last year. Apparently, his pharmacy had been sending refill requests to the Inpatient Physician.  The patient was given the phone number to the clinic at the hospital and did not have any luck speaking with anyone. He said he was told someone would call him back.  From chart review, patient was last seen by [facility] and the note from [facility] on 12/21/2019 stated the patient was supposed to continue Eliquis 5mg  1 tablet twice daily.  The Neurology Practice was called on the patient's behalf. Unfortunately, they closed at 12 noon. The provider on called was paged.    Dr. Felecia Shelling called me back and said he would send in refills for the patient.  Patient said his Pharmacist gave him a few days of therapy but was very happy to have the issue resolved.  Plan: Spoke with patient who said he would go and pick up the prescription ASAP.  Close patient's pharmacy case. Patient has my contact information for any medication issues in the future.  [name], PharmD, [place] Clinical Pharmacist [phone]

## 2020-01-20 NOTE — Telephone Encounter (Signed)
Received a phone call from [place] about Eliquis refills.  Chart reviewed.   Additional refills sent to [facility]

## 2020-01-23 NOTE — Telephone Encounter (Signed)
Thank you for sending.  I will discuss with him at follow-up visit regarding ongoing refills for Eliquis will be done through cardiology.  Again, thank you for your assistance!

## 2020-02-14 ENCOUNTER — Other Ambulatory Visit: Payer: Self-pay | Admitting: Nurse Practitioner

## 2020-02-21 ENCOUNTER — Ambulatory Visit: Payer: HMO | Admitting: *Deleted

## 2020-02-21 DIAGNOSIS — M238X2 Other internal derangements of left knee: Secondary | ICD-10-CM | POA: Diagnosis not present

## 2020-02-21 DIAGNOSIS — M25562 Pain in left knee: Secondary | ICD-10-CM | POA: Diagnosis not present

## 2020-03-01 ENCOUNTER — Ambulatory Visit: Payer: Self-pay | Admitting: Pharmacist

## 2020-03-02 ENCOUNTER — Telehealth: Payer: Self-pay | Admitting: *Deleted

## 2020-03-02 ENCOUNTER — Other Ambulatory Visit: Payer: Self-pay

## 2020-03-02 DIAGNOSIS — M25562 Pain in left knee: Secondary | ICD-10-CM | POA: Diagnosis not present

## 2020-03-02 DIAGNOSIS — M238X2 Other internal derangements of left knee: Secondary | ICD-10-CM | POA: Diagnosis not present

## 2020-03-02 NOTE — Patient Outreach (Signed)
  [facility]) Care Management Chronic Special Needs Program    03/02/2020  Name: DENARIO BAIK, DOB: 1941-05-13  MRN: [id number]   Mr. Aashir Portwood is enrolled in a chronic special needs plan for Diabetes. Telephone call to client for assessment follow up. Unable to reach client. HIPAA compliant voice message left with call back phone number and return call request.   PLAN; RNCM will attempt 2nd telephone outreach within 1 week. Quinn Plowman RN,BSN,CCM [facility] Care Management [phone]

## 2020-03-02 NOTE — Telephone Encounter (Addendum)
   [facility] Pre-operative Risk Assessment    Request for surgical clearance:  1. What type of surgery is being performed? LEFT KNEE, PATELLA FX, TENDON REPAIR   2. When is this surgery scheduled? TBD   3. What type of clearance is required (medical clearance vs. Pharmacy clearance to hold med vs. Both)? BOTH  4. Are there any medications that need to be held prior to surgery and how long? ELIQUIS and ASA   5. Practice name and name of physician performing surgery? [facility]; DR. Mitzi Hansen Dunn  6. What is your office phone number [phone]    7.   What is your office fax number [phone]   8.   Anesthesia type (None, local, MAC, general) ? SPINAL   Curtis Dunn 03/02/2020, 3:57 PM  _________________________________________________________________   (provider comments below)

## 2020-03-05 DIAGNOSIS — S82012D Displaced osteochondral fracture of left patella, subsequent encounter for closed fracture with routine healing: Secondary | ICD-10-CM | POA: Diagnosis not present

## 2020-03-05 DIAGNOSIS — M25562 Pain in left knee: Secondary | ICD-10-CM | POA: Diagnosis not present

## 2020-03-06 NOTE — Telephone Encounter (Signed)
My recommendation would be to stop his Eliquis 36 hours prior to surgical procedure that morning given his recent stroke history.  If this is not felt to be adequate by orthopedic surgery, we can have our pharmacy team help bridge with Lovenox.  [name], MD

## 2020-03-06 NOTE — Telephone Encounter (Signed)
Patient is cleared for surgery.  Will forward to Dr. Marlou Porch for guidance on holding anticoagulation for three days. Will put through as "urgent" given timing of surgery.

## 2020-03-06 NOTE — Telephone Encounter (Signed)
Patient with diagnosis of afib on Eliquis for anticoagulation.    Procedure: left knee patella fixation and tendon repair Date of procedure: TBD  CHADS2-VASc score of  6 (age x2, HTN, DM, stroke in Dec 2020)  CrCl 11mL/min Platelet count 172  Pt is at elevated cardiac risk due to recent stroke history in setting of afib. Prefer to minimize time off of anticoagulation as much as possible. Ideally would prefer 24 hour hold, if longer hold is needed, will need to defer to MD for input. Will need to resume Eliquis as soon as safely possible after procedure.

## 2020-03-06 NOTE — Telephone Encounter (Signed)
   Primary Cardiologist: [name], MD  Chart reviewed as part of pre-operative protocol coverage. Patient was contacted 03/06/2020 in reference to pre-operative risk assessment for pending surgery as outlined below.  DAHANI PEREZHERNANDEZ was last seen on 11/29/19 by Dr. Marlou Porch.  Since that day, CURRY CONK has done well and can complete 4.0 METS without angina.  Per our clinical pharmacist: Patient with diagnosis of afib on Eliquis for anticoagulation.    Procedure: left knee patella fixation and tendon repair Date of procedure: TBD  CHADS2-VASc score of  6 (age x2, HTN, DM, stroke in Dec 2020)  CrCl 37mL/min Platelet count 172  Pt is at elevated cardiac risk due to recent stroke history in setting of afib. Prefer to minimize time off of anticoagulation as much as possible. Ideally would prefer 24 hour hold, if longer hold is needed, will need to defer to MD for input. Will need to resume Eliquis as soon as safely possible after procedure   Per Dr. Marlou Porch: My recommendation would be to stop his Eliquis 36 hours prior to surgical procedure that morning given his recent stroke history.  If this is not felt to be adequate by orthopedic surgery, we can have our pharmacy team help bridge with Lovenox   Therefore, based on ACC/AHA guidelines, the patient would be at acceptable risk for the planned procedure without further cardiovascular testing.   I will route this recommendation to the requesting party via Epic fax function and remove from pre-op pool.  Please call with questions.  Tami Lin Ciria Bernardini, PA 03/06/2020, 2:57 PM

## 2020-03-08 ENCOUNTER — Ambulatory Visit: Payer: Self-pay

## 2020-03-09 ENCOUNTER — Other Ambulatory Visit: Payer: Self-pay

## 2020-03-09 DIAGNOSIS — S82009A Unspecified fracture of unspecified patella, initial encounter for closed fracture: Secondary | ICD-10-CM | POA: Insufficient documentation

## 2020-03-09 NOTE — Patient Outreach (Signed)
[place] [facility]) Care Management Chronic Special Needs Program  03/09/2020  Name: Curtis Dunn DOB: 05-Jan-1941  MRN: [id number]  Mr. Chamar Parmentier is enrolled in a chronic special needs plan for Diabetes. Telephone call to client for assessment follow up. Client states he recently fell and sustained a fractured left knee. He states he is currently waiting for his cardiologist to authorize him for surgery. Client states his blood sugar dropped on yesterday to 53 and EMS was called by his daughter. Client states he elected not to go to the emergency room. Advised to notify his doctor of low blood sugar. Client reports he continues to take her medications/ insulin as prescribed and checks his blood sugars several times per day due to having a [employer].  Client reports he was not able to complete his Advanced directive with the lawyer because he had the fall the day before the lawyer appointment. Client reports he will continue to work on having his Advanced directive completed. Client reports he received his COVID vaccine.   Reviewed and updated care plan.   Goals Addressed            This Visit's Progress   . COMPLETED: " I want to lose 5 lbs; my goal weight is 155 lbs"       Client reports meeting goal weight loss. Current weight 152 lbs.      [name] 'I'm concerned about the new swelling in my feet since my gout resolved" (pt-stated)   On track    Continue to reduce lower extremity edema such as elevating feet above heart level, performing ankle pumps to promote venous return and adherence with furosemide therapy Contact your doctor if you have ongoing questions or concerns.  Goal renewed 2021    . Advanced Care Planning complete by December 2021   On track    Client has the information but has not completed them yet Goal renewed 2021     . Client understands the importance of follow-up with providers by attending scheduled visits   On track    Most recent  primary care provider / endocrinology visit 01/20/20 Continue to maintain and keep follow up visits with your providers.  Goal renewed 2021     . Client will report follow up with doctor regarding hypoglycemic event within 1 month (pt-stated)       RN case manager advised client to follow up with doctor regarding recent hypoglycemic event.    . Client will report no fall or injuries in the next 1-2 months.       Client reports recent fall sustaining fractured left knee RN case manager will send client education article: Preventing falls and Preventing falls in older adults.  Goal renewed 2021    . Client will verbalize knowledge of self management of Hypertension as evidences by BP reading of 130/80 or less; or as defined by provider   On track    Discussed blood pressure self management skills. Client reports checking his blood pressure periodically.  RN case manager will send client education article: Diabetes and blood pressure.  Goal renewed 2021     . HEMOGLOBIN A1C < 7.0       Hgb A1C is consistently below 7 Hgb A1C was 5.9% on 01/20/20  Education information sent quarterly from [employer] to client. Please review. Goal renewed 2021     . Maintain timely refills of diabetic medication as prescribed within the year .   On track  Client reports good medication taking behavior  Review of electronic medical record medication dispense report indicates client maintains timely refills of diabetic medications Continue to take your medication as prescribed.  Goal renewed 2021    . Obtain annual  Lipid Profile, LDL-C- LDL goal is <70   On track    Lipid panel was drawn on 11/15/19  The goal for LDL is less than 70 mg/dl as you are at high risk for complications try to avoid saturated fats, trans-fats, and eat more fiber. RN case manager will send client education article: Heart Healthy diet Goal renewed 2021    . Obtain Annual Eye (retinal)  Exam    On track    Client had  retinal eye exam on 07/14/19  Plan to schedule your eye exam yearly Goal renewed 2021     . Obtain Annual Foot Exam   On track    Diabetic foot exam was completed on 01/20/20 Diabetes foot care - Check feet daily at home (look for skin color changes, cuts, sores or cracks in the skin, swelling of feet or ankles, ingrown or fungal toenails, corn or calluses). Report these findings to your doctor - Wash feet with soap and water, dry feet well especially between toes - Moisturize your feet but not between the toes - Always wear shoes that protect your whole feet.      . Obtain annual screen for micro albuminuria (urine) , nephropathy (kidney problems)   On track    Most recent urine for microalbumin was completed  04/19/18 Attend yearly physicals and follow up visits with your providers and complete labs as recommended.  Goal renewed 2021    . Obtain Hemoglobin A1C at least 2 times per year   On track    Most recent Hgb A1C was checked 11/15/19 and 01/20/20 Continue to keep your follow up appointments with your provider and have lab work completed as recommended.  Goal renewed 2021    . Visit Primary Care Provider or Endocrinologist at least 2 times per year    On track    Last visit with Dr. Forde Dandy was 10/04/19 and 01/20/20 Continue to see your primary care provider/ endocrinologist as recommended.  Goal renewed 2021      Assessment: Client is meeting diabetes self-management goal of hemoglobin A1C of <7.0% with most recent reading of 5.9% on 01/20/20 with report of hypoglycemia .  Plan:  Send successful outreach letter with a copy of their individualized care plan, Send individual care plan to provider and Send educational material  Chronic care management coordinator will outreach in:  3 Months  Quinn Plowman RN,BSN,CCM [place] Management [phone]    .

## 2020-03-12 NOTE — Telephone Encounter (Signed)
Follow up  Curtis Dunn from [facility] calling, she said they haven't receive the clearance, she provided a new fax# [phone] to resend Dr. Marlou Porch recommendation for the pt  Please advise

## 2020-03-12 NOTE — Telephone Encounter (Signed)
Follow up  Pt is calling back to follow up clearance. He would like to know what Dr. Marlou Porch recommendation regarding his blood thinner

## 2020-03-13 ENCOUNTER — Encounter (HOSPITAL_COMMUNITY): Payer: Self-pay

## 2020-03-13 NOTE — Progress Notes (Addendum)
PCP - [facility] Cardiologist - Clearance [place]  03-06-20 epic Neuro- Arlice Colt  lov 12-21-19 epic  Chest x-ray -  EKG - 11-15-19 epic Stress Test -  ECHO - 11-15-19 epic and chart Cardiac Cath -   Sleep Study -  CPAP -   Fasting Blood Sugar -  Checks Blood Sugar _____ times a day  Blood Thinner Instructions:Elequis stop 3 days prior per cardiology Aspirin Instructions: Last Dose:  Anesthesia review: PAF CVA 10-2019 no deficit  Patient denies shortness of breath, fever, cough and chest pain at PAT appointment   NONE   Patient verbalized understanding of instructions that were given to them at the PAT appointment. Patient was also instructed that they will need to review over the PAT instructions again at home before surgery.

## 2020-03-13 NOTE — Telephone Encounter (Signed)
I have re-faxed clearance

## 2020-03-13 NOTE — Patient Instructions (Addendum)
DUE TO COVID-19 ONLY ONE VISITOR IS ALLOWED TO COME WITH YOU AND STAY IN THE WAITING ROOM ONLY DURING PRE OP AND PROCEDURE DAY OF SURGERY. TWO  VISITOR MAY VISIT WITH YOU AFTER SURGERY IN YOUR PRIVATE ROOM DURING VISITING HOURS ONLY!  10a-8p  YOU NEED TO HAVE A COVID 19 TEST ON___2-27-21____ @_1255______ , THIS TEST MUST BE DONE BEFORE SURGERY, COME  [address], [place] , [address].  ([place]) ONCE YOUR COVID TEST IS COMPLETED, PLEASE BEGIN THE QUARANTINE INSTRUCTIONS AS OUTLINED IN YOUR HANDOUT.                COLLIE BAMBRICK  03/13/2020   Your procedure is scheduled on: 03-16-20   Report to [facility] Main  Entrance   Report to admitting at      0530  AM     Call this number if you have problems the morning of surgery [phone]    Remember: NO SOLID FOOD AFTER MIDNIGHT THE NIGHT PRIOR TO SURGERY. NOTHING BY MOUTH EXCEPT CLEAR LIQUIDS UNTIL   0430 am  . PLEASE FINISH G2  DRINK PER SURGEON ORDER  WHICH NEEDS TO BE COMPLETED AT    0430 am then nothing by mouth .    CLEAR LIQUID DIET  Till 043o am then nothing by mouth   Foods Allowed                                                                               Foods Excluded  Coffee and tea, regular and decaf   No creamer                          liquids that you cannot  Plain Jell-O any favor except red or purple                                           see through such as: Fruit ices (not with fruit pulp)                                                               milk, soups, orange juice  Iced Popsicles                                                                 All solid food Carbonated beverages, regular and diet                                    Cranberry, grape and apple juices Sports drinks like Gatorade Lightly seasoned  clear broth or consume(fat free) Sugar, honey syrup   _____________________________________________________________________   BRUSH YOUR TEETH MORNING  OF SURGERY AND RINSE YOUR MOUTH OUT, NO CHEWING GUM CANDY OR MINTS.     Take these medicines the morning of surgery with A SIP OF WATER: allopurinol, amlodipine, hydralazine, lovastatin, metoprolol  How to Manage Your Diabetes Before and After Surgery  Why is it important to control my blood sugar before and after surgery? . Improving blood sugar levels before and after surgery helps healing and can limit problems. . A way of improving blood sugar control is eating a healthy diet by: o  Eating less sugar and carbohydrates o  Increasing activity/exercise o  Talking with your doctor about reaching your blood sugar goals . High blood sugars (greater than 180 mg/dL) can raise your risk of infections and slow your recovery, so you will need to focus on controlling your diabetes during the weeks before surgery. . Make sure that the doctor who takes care of your diabetes knows about your planned surgery including the date and location.  How do I manage my blood sugar before surgery? . Check your blood sugar at least 4 times a day, starting 2 days before surgery, to make sure that the level is not too high or low. o Check your blood sugar the morning of your surgery when you wake up and every 2 hours until you get to the Short Stay unit. . If your blood sugar is less than 70 mg/dL, you will need to treat for low blood sugar: o Do not take insulin. o Treat a low blood sugar (less than 70 mg/dL) with  cup of clear juice (cranberry or apple), 4 glucose tablets, OR glucose gel. o Recheck blood sugar in 15 minutes after treatment (to make sure it is greater than 70 mg/dL). If your blood sugar is not greater than 70 mg/dL on recheck, call [phone] for further instructions. . Report your blood sugar to the short stay nurse when you get to Short Stay.  . If you are admitted to the hospital after surgery: o Your blood sugar will be checked by the staff and you will probably be given insulin after  surgery (instead of oral diabetes medicines) to make sure you have good blood sugar levels. o The goal for blood sugar control after surgery is 80-180 mg/dL.   WHAT DO I DO ABOUT MY DIABETES MEDICATION?  [name] Do not take oral diabetes medicines (pills) the morning of surgery.  . THE NIGHT BEFORE SURGERY,  No bedtime novolog   Lantus take 50% of your normal dinner/ bedtime dose    . THE MORNING OF SURGERY, take 50% of your lantus insulin dose  . The day of surgery, do not take other diabetes injectables, including Byetta (exenatide), Bydureon (exenatide ER), Victoza (liraglutide), or Trulicity (dulaglutide).  . If your CBG is greater than 220 mg/dL, you may take  of your sliding scale  . (correction) dose of insulin.   NOVOLOG                                   You may not have any metal on your body including hair pins and              piercings  Do not wear jewelry,  lotions, powders or perfumes, deodorant  Men may shave face and neck.   Do not bring valuables to the hospital. [place].  Contacts, dentures or bridgework may not be worn into surgery.      Patients discharged the day of surgery will not be allowed to drive home. IF YOU ARE HAVING SURGERY AND GOING HOME THE SAME DAY, YOU MUST HAVE AN ADULT TO DRIVE YOU HOME AND BE WITH YOU FOR 24 HOURS. YOU MAY GO HOME BY TAXI OR UBER OR ORTHERWISE, BUT AN ADULT MUST ACCOMPANY YOU HOME AND STAY WITH YOU FOR 24 HOURS.  Name and phone number of your driver:  Special Instructions: N/A              Please read over the following fact sheets you were given: _____________________________________________________________________             [facility] - Preparing for Surgery Before surgery, you can play an important role.  Because skin is not sterile, your skin needs to be as free of germs as possible.  You can reduce the number of germs on your skin by washing with  CHG (chlorahexidine gluconate) soap before surgery.  CHG is an antiseptic cleaner which kills germs and bonds with the skin to continue killing germs even after washing. Please DO NOT use if you have an allergy to CHG or antibacterial soaps.  If your skin becomes reddened/irritated stop using the CHG and inform your nurse when you arrive at Short Stay. Do not shave (including legs and underarms) for at least 48 hours prior to the first CHG shower.  You may shave your face/neck. Please follow these instructions carefully:  1.  Shower with CHG Soap the night before surgery and the  morning of Surgery.  2.  If you choose to wash your hair, wash your hair first as usual with your  normal  shampoo.  3.  After you shampoo, rinse your hair and body thoroughly to remove the  shampoo.                           4.  Use CHG as you would any other liquid soap.  You can apply chg directly  to the skin and wash                       Gently with a scrungie or clean washcloth.  5.  Apply the CHG Soap to your body ONLY FROM THE NECK DOWN.   Do not use on face/ open                           Wound or open sores. Avoid contact with eyes, ears mouth and genitals (private parts).                       Wash face,  Genitals (private parts) with your normal soap.             6.  Wash thoroughly, paying special attention to the area where your surgery  will be performed.  7.  Thoroughly rinse your body with warm water from the neck down.  8.  DO NOT shower/wash with your normal soap after using and rinsing off  the CHG Soap.  9.  Pat yourself dry with a clean towel.            10.  Wear clean pajamas.            11.  Place clean sheets on your bed the night of your first shower and do not  sleep with pets. Day of Surgery : Do not apply any lotions/deodorants the morning of surgery.  Please wear clean clothes to the hospital/surgery center.  FAILURE TO FOLLOW THESE INSTRUCTIONS MAY RESULT IN THE CANCELLATION  OF YOUR SURGERY PATIENT SIGNATURE_________________________________  NURSE SIGNATURE__________________________________  ________________________________________________________________________   Adam Phenix  An incentive spirometer is a tool that can help keep your lungs clear and active. This tool measures how well you are filling your lungs with each breath. Taking long deep breaths may help reverse or decrease the chance of developing breathing (pulmonary) problems (especially infection) following:  A long period of time when you are unable to move or be active. BEFORE THE PROCEDURE   If the spirometer includes an indicator to show your best effort, your nurse or respiratory therapist will set it to a desired goal.  If possible, sit up straight or lean slightly forward. Try not to slouch.  Hold the incentive spirometer in an upright position. INSTRUCTIONS FOR USE  1. Sit on the edge of your bed if possible, or sit up as far as you can in bed or on a chair. 2. Hold the incentive spirometer in an upright position. 3. Breathe out normally. 4. Place the mouthpiece in your mouth and seal your lips tightly around it. 5. Breathe in slowly and as deeply as possible, raising the piston or the ball toward the top of the column. 6. Hold your breath for 3-5 seconds or for as long as possible. Allow the piston or ball to fall to the bottom of the column. 7. Remove the mouthpiece from your mouth and breathe out normally. 8. Rest for a few seconds and repeat Steps 1 through 7 at least 10 times every 1-2 hours when you are awake. Take your time and take a few normal breaths between deep breaths. 9. The spirometer may include an indicator to show your best effort. Use the indicator as a goal to work toward during each repetition. 10. After each set of 10 deep breaths, practice coughing to be sure your lungs are clear. If you have an incision (the cut made at the time of surgery), support your  incision when coughing by placing a pillow or rolled up towels firmly against it. Once you are able to get out of bed, walk around indoors and cough well. You may stop using the incentive spirometer when instructed by your caregiver.  RISKS AND COMPLICATIONS  Take your time so you do not get dizzy or light-headed.  If you are in pain, you may need to take or ask for pain medication before doing incentive spirometry. It is harder to take a deep breath if you are having pain. AFTER USE  Rest and breathe slowly and easily.  It can be helpful to keep track of a log of your progress. Your caregiver can provide you with a simple table to help with this. If you are using the spirometer at home, follow these instructions: [place] IF:   You are having difficultly using the spirometer.  You have trouble using the spirometer as often as instructed.  Your pain medication is not giving enough relief while using the spirometer.  You  develop fever of 100.5 F (38.1 C) or higher. SEEK IMMEDIATE MEDICAL CARE IF:   You cough up bloody sputum that had not been present before.  You develop fever of 102 F (38.9 C) or greater.  You develop worsening pain at or near the incision site. MAKE SURE YOU:   Understand these instructions.  Will watch your condition.  Will get help right away if you are not doing well or get worse. Document Released: 03/16/2007 Document Revised: 01/26/2012 Document Reviewed: 05/17/2007 [facility] Patient Information 2014 [place], [place].   ________________________________________________________________________

## 2020-03-14 ENCOUNTER — Encounter (HOSPITAL_COMMUNITY): Payer: Self-pay

## 2020-03-14 ENCOUNTER — Encounter (HOSPITAL_COMMUNITY)
Admission: RE | Admit: 2020-03-14 | Discharge: 2020-03-14 | Disposition: A | Discharge status: Home / Self Care | Payer: HMO | Source: Ambulatory Visit | Attending: Specialist | Admitting: Specialist

## 2020-03-14 ENCOUNTER — Other Ambulatory Visit (HOSPITAL_COMMUNITY)
Admission: RE | Admit: 2020-03-14 | Discharge: 2020-03-14 | Disposition: A | Discharge status: Home / Self Care | Payer: HMO | Source: Ambulatory Visit | Attending: Specialist | Admitting: Specialist

## 2020-03-14 ENCOUNTER — Other Ambulatory Visit: Payer: Self-pay

## 2020-03-14 DIAGNOSIS — S82002A Unspecified fracture of left patella, initial encounter for closed fracture: Secondary | ICD-10-CM | POA: Diagnosis not present

## 2020-03-14 DIAGNOSIS — I10 Essential (primary) hypertension: Secondary | ICD-10-CM | POA: Diagnosis present

## 2020-03-14 DIAGNOSIS — Z7982 Long term (current) use of aspirin: Secondary | ICD-10-CM | POA: Diagnosis not present

## 2020-03-14 DIAGNOSIS — Z79899 Other long term (current) drug therapy: Secondary | ICD-10-CM | POA: Diagnosis not present

## 2020-03-14 DIAGNOSIS — Z8249 Family history of ischemic heart disease and other diseases of the circulatory system: Secondary | ICD-10-CM | POA: Diagnosis not present

## 2020-03-14 DIAGNOSIS — Z7901 Long term (current) use of anticoagulants: Secondary | ICD-10-CM | POA: Diagnosis not present

## 2020-03-14 DIAGNOSIS — Z20822 Contact with and (suspected) exposure to covid-19: Secondary | ICD-10-CM | POA: Diagnosis present

## 2020-03-14 DIAGNOSIS — Z8673 Personal history of transient ischemic attack (TIA), and cerebral infarction without residual deficits: Secondary | ICD-10-CM | POA: Diagnosis not present

## 2020-03-14 DIAGNOSIS — Z01812 Encounter for preprocedural laboratory examination: Secondary | ICD-10-CM | POA: Insufficient documentation

## 2020-03-14 DIAGNOSIS — E10649 Type 1 diabetes mellitus with hypoglycemia without coma: Secondary | ICD-10-CM | POA: Diagnosis present

## 2020-03-14 DIAGNOSIS — I480 Paroxysmal atrial fibrillation: Secondary | ICD-10-CM | POA: Diagnosis present

## 2020-03-14 DIAGNOSIS — Z85038 Personal history of other malignant neoplasm of large intestine: Secondary | ICD-10-CM | POA: Diagnosis not present

## 2020-03-14 DIAGNOSIS — Z794 Long term (current) use of insulin: Secondary | ICD-10-CM | POA: Diagnosis not present

## 2020-03-14 DIAGNOSIS — S82032A Displaced transverse fracture of left patella, initial encounter for closed fracture: Secondary | ICD-10-CM | POA: Diagnosis present

## 2020-03-14 DIAGNOSIS — M19042 Primary osteoarthritis, left hand: Secondary | ICD-10-CM | POA: Diagnosis present

## 2020-03-14 DIAGNOSIS — M109 Gout, unspecified: Secondary | ICD-10-CM | POA: Diagnosis present

## 2020-03-14 DIAGNOSIS — G8918 Other acute postprocedural pain: Secondary | ICD-10-CM | POA: Diagnosis not present

## 2020-03-14 DIAGNOSIS — S82042A Displaced comminuted fracture of left patella, initial encounter for closed fracture: Secondary | ICD-10-CM | POA: Diagnosis present

## 2020-03-14 DIAGNOSIS — W1830XA Fall on same level, unspecified, initial encounter: Secondary | ICD-10-CM | POA: Diagnosis present

## 2020-03-14 DIAGNOSIS — E785 Hyperlipidemia, unspecified: Secondary | ICD-10-CM | POA: Diagnosis present

## 2020-03-14 HISTORY — DX: Cerebral infarction, unspecified: I63.9

## 2020-03-14 HISTORY — DX: Unspecified osteoarthritis, unspecified site: M19.90

## 2020-03-14 LAB — BASIC METABOLIC PANEL
Anion gap: 7 (ref 5–15)
BUN: 39 mg/dL — ABNORMAL HIGH (ref 8–23)
CO2: 28 mmol/L (ref 22–32)
Calcium: 10 mg/dL (ref 8.9–10.3)
Chloride: 106 mmol/L (ref 98–111)
Creatinine, Ser: 1.58 mg/dL — ABNORMAL HIGH (ref 0.61–1.24)
GFR calc Af Amer: 48 mL/min — ABNORMAL LOW (ref >60)
GFR calc non Af Amer: 41 mL/min — ABNORMAL LOW (ref >60)
Glucose, Bld: 68 mg/dL — ABNORMAL LOW (ref 70–99)
Potassium: 5 mmol/L (ref 3.5–5.1)
Sodium: 141 mmol/L (ref 135–145)

## 2020-03-14 LAB — CBC
HCT: 35.7 % — ABNORMAL LOW (ref 39.0–52.0)
Hemoglobin: 11.4 g/dL — ABNORMAL LOW (ref 13.0–17.0)
MCH: 32.3 pg (ref 26.0–34.0)
MCHC: 31.9 g/dL (ref 30.0–36.0)
MCV: 101.1 fL — ABNORMAL HIGH (ref 80.0–100.0)
Platelets: 334 10*3/uL (ref 150–400)
RBC: 3.53 MIL/uL — ABNORMAL LOW (ref 4.22–5.81)
RDW: 13.5 % (ref 11.5–15.5)
WBC: 8.9 10*3/uL (ref 4.0–10.5)
nRBC: 0 % (ref 0.0–0.2)

## 2020-03-14 LAB — HEMOGLOBIN A1C
Hgb A1c MFr Bld: 5.8 % — ABNORMAL HIGH (ref 4.8–5.6)
Mean Plasma Glucose: 119.76 mg/dL

## 2020-03-14 LAB — SARS CORONAVIRUS 2 (TAT 6-24 HRS): SARS Coronavirus 2: NEGATIVE

## 2020-03-15 ENCOUNTER — Encounter (HOSPITAL_COMMUNITY): Payer: Self-pay | Admitting: Specialist

## 2020-03-15 NOTE — H&P (View-Only) (Signed)
Anesthesia Chart Review   Case: [id number] Date/Time: 03/16/20 0715   Procedure: OPEN REDUCTION INTERNAL (ORIF) FIXATION PATELLA vs patellectomy (Left Knee) - femoral nerve block vs general   Anesthesia type: Spinal   Pre-op diagnosis: Left patella fracture   Location: [facility] ROOM 06 / [facility]   Surgeons: Collins, Robert, MD      DISCUSSION:79 y.o. never smoker with h/o HTN, HLD, Stroke 11/14/2019, PAF (on Eliquis), DM II, left patella fracture scheduled for above procedure 03/16/2020 with Dr. Robert Collins.   Per cardiology 03/06/2020, "  Curtis Dunn was last seen on 11/29/19 by Dr. Skains.  Since that day, Curtis Dunn has done well and can complete 4.0 METS without angina. Per our clinical pharmacist: Patient with diagnosis ofafib on Eliquisfor anticoagulation.  Procedure:left knee patella fixation and tendon repair Date of procedure:TBD CHADS2-VASc score of6(age x2, HTN, DM, stroke in Dec 2020) CrCl51mL/min Platelet count172 Pt is at elevated cardiac risk due to recent stroke history in setting of afib. Prefer to minimize time off of anticoagulation as much as possible. Ideally would prefer 24 hour hold, if longer hold is needed, will need to defer to MD for input. Will need to resume Eliquis as soon as safely possible after procedure Per Dr. Skains: My recommendation would be to stop his Eliquis 36 hours prior to surgical procedure that morning given his recent stroke history. If this is not felt to be adequate by orthopedic surgery, we can have our pharmacy team help bridge with Lovenox Therefore, based on ACC/AHA guidelines, the patient would be at acceptable risk for the planned procedure without further cardiovascular testing."  Dr. Collins aware of Dr. Skains recommendations regarding holding Eliquis for only 36 hours. Will proceed with general anesthesia.    Pt has been experiencing episodes of hypoglycemia.  Blood glucose 68 at PAT visit.  Pt monitors glucose  closely and has been in contact with PCP. He understands diabetes medication instructions.  Will evaluate DOS.   Anticipate pt can proceed with planned procedure barring acute status change.   VS: BP (!) 131/52 (BP Location: Right Arm)   Pulse 67   Temp 36.9 C (Oral)   Resp 18   Ht 5' 11" (1.803 m)   Wt 68.9 kg   SpO2 99%   BMI 21.20 kg/m   PROVIDERS: South, Stephen, MD is PCP   Skains, Mark, MD is Cardiologist  LABS: Glucose 68, Creatine 1.58 (all labs ordered are listed, but only abnormal results are displayed)  Labs Reviewed - No data to display   IMAGES:   EKG: 11/15/2019 Rate 55 bpm  Sinus bradycardia Incomplete right bundle branch block  Minimal voltage criteria for LVH, may be normal variant (R in aVL) Septal infarct, age undetermined   CV: Echo 11/15/2019 IMPRESSIONS    1. Left ventricular ejection fraction, by visual estimation, is 60 to  65%. The left ventricle has normal function. There is no left ventricular  hypertrophy.  2. The left ventricle has no regional wall motion abnormalities.  3. Global right ventricle has normal systolic function.The right  ventricular size is normal. No increase in right ventricular wall  thickness.  4. Left atrial size was normal.  5. Right atrial size was normal.  6. Trivial pericardial effusion is present.  7. Mild mitral annular calcification.  8. The mitral valve is normal in structure. Trivial mitral valve  regurgitation. No evidence of mitral stenosis.  9. The tricuspid valve is normal in structure.  10. The aortic   valve is normal in structure. Aortic valve regurgitation is  trivial. No evidence of aortic valve sclerosis or stenosis.  11. The pulmonic valve was normal in structure. Pulmonic valve  regurgitation is not visualized.  12. TR signal is inadequate for assessing pulmonary artery systolic  pressure.  13. The inferior vena cava is normal in size with greater than 50%  respiratory  variability, suggesting right atrial pressure of 3 mmHg. Past Medical History:  Diagnosis Date  . Arthritis    left thumb  . Broken neck ([place])    in March 2015  . Cataract   . Colon cancer ([place])   . Colon polyp   . Diabetes mellitus    Type 1  since 1974  . Dysrhythmia    a fib  . Gout   . Hyperlipidemia   . Hypertension   . Lipoma of back   . Paroxysmal atrial fibrillation ([facility])   . Stroke [facility])    11/14/19  no defecit     Past Surgical History:  Procedure Laterality Date  . ANKLE SURGERY     right  . APPENDECTOMY  2001  . arm surgery     right shoulder surgery  rmoval of tendon  . CATARACT EXTRACTION EXTRACAPSULAR Bilateral   . COLON RESECTION    . ELBOW FRACTURE SURGERY    . HERNIA REPAIR     umbilica and left inguinal  . INGUINAL HERNIA REPAIR Left 03/13/2016   Procedure: LEFT INGUINAL HERNIA REPAIR WITH UNBILICAL HERNIA REPAIR;  Surgeon: [name], MD;  Location: [place];  Service: General;  Laterality: Left;  . INSERTION OF MESH Left 03/13/2016   Procedure: INSERTION OF MESH;  Surgeon: [name], MD;  Location: [place];  Service: General;  Laterality: Left;  [name] MASS EXCISION N/A 07/20/2019   Procedure: EXCISION OF BACK MASS;  Surgeon: Rolm Bookbinder, MD;  Location: [place];  Service: General;  Laterality: N/A;  MAC AND LOCAL  . VASECTOMY      MEDICATIONS: . allopurinol (ZYLOPRIM) 100 MG tablet  . amLODipine (NORVASC) 5 MG tablet  . apixaban (ELIQUIS) 5 MG TABS tablet  . Ascorbic Acid (VITAMIN C) 1000 MG tablet  . aspirin EC 81 MG EC tablet  . Cholecalciferol (VITAMIN D3) 250 MCG (10000 UT) capsule  . Coenzyme Q10 (COQ10 PO)  . Continuous Blood Gluc Sensor (FREESTYLE LIBRE 14 DAY SENSOR) MISC  . Cyanocobalamin (VITAMIN B-12) 5000 MCG SUBL  . furosemide (LASIX) 40 MG tablet  . [place] 565 MG CAPS  . hydrALAZINE (APRESOLINE) 50 MG tablet  . insulin aspart (NOVOLOG) 100 UNIT/ML injection  . insulin glargine (LANTUS) 100 UNIT/ML  injection  . lovastatin (MEVACOR) 20 MG tablet  . metoprolol succinate (TOPROL-XL) 25 MG 24 hr tablet  . Nutritional Supplements (DHEA PO)  . Omega 3-6-9 Fatty Acids (TRIPLE OMEGA-3-6-9 PO)  . Potassium 99 MG TABS  . ramipril (ALTACE) 10 MG capsule  . [place] 450 MG CAPS  . vitamin E 1000 UNIT capsule  . Zinc 50 MG CAPS   No current facility-administered medications for this encounter.    Maia Plan [facility] Pre-Surgical Testing [phone] 03/15/20  10:29 AM

## 2020-03-15 NOTE — Anesthesia Preprocedure Evaluation (Addendum)
Anesthesia Evaluation  Patient identified by MRN, date of birth, ID band Patient awake    Reviewed: Allergy & Precautions, NPO status , Patient's Chart, lab work & pertinent test results  Airway Mallampati: II  TM Distance: >3 FB Neck ROM: Full    Dental no notable dental hx. (+) Teeth Intact, Caps   Pulmonary neg pulmonary ROS,    Pulmonary exam normal breath sounds clear to auscultation       Cardiovascular hypertension, Pt. on medications and Pt. on home beta blockers Normal cardiovascular exam+ dysrhythmias Atrial Fibrillation  Rhythm:Regular Rate:Normal  EKG Sinus bradycardia, incomplete RBBB, septal infarct age indeterminate Echo 11/15/19 1. Left ventricular ejection fraction, by visual estimation, is 60 to 65%. The left ventricle has normal function. There is no left ventricular hypertrophy.  2. The left ventricle has no regional wall motion abnormalities.  3. Global right ventricle has normal systolic function.The right ventricular size is normal. No increase in right ventricular wall thickness.  4. Left atrial size was normal.  5. Right atrial size was normal.  6. Trivial pericardial effusion is present.  7. Mild mitral annular calcification.  8. The mitral valve is normal in structure. Trivial mitral valve regurgitation. No evidence of mitral stenosis.  9. The tricuspid valve is normal in structure.  10. The aortic valve is normal in structure. Aortic valve regurgitation is trivial. No evidence of aortic valve sclerosis or stenosis.  11. The pulmonic valve was normal in structure. Pulmonic valve regurgitation is not visualized.  12. TR signal is inadequate for assessing pulmonary artery systolicpressure.  13. The inferior vena cava is normal in size with greater than 50% respiratory variability, suggesting right atrial pressure of 3 mmHg.    Neuro/Psych Cerebellar stroke CVA, Residual Symptoms negative psych ROS    GI/Hepatic negative GI ROS,   Endo/Other  diabetes, Well Controlled, Type 2, Insulin DependentHyperlipidemia  Renal/GU Renal InsufficiencyRenal disease  negative genitourinary   Musculoskeletal  (+) Arthritis , Osteoarthritis,  Fx left patella   Abdominal   Peds  Hematology  (+) anemia , Eliquis therapy- last dose 36 hrs ago   Anesthesia Other Findings   Reproductive/Obstetrics                           Anesthesia Physical Anesthesia Plan  ASA: III  Anesthesia Plan: General   Post-op Pain Management:  Regional for Post-op pain   Induction: Intravenous  PONV Risk Score and Plan: 3 and Ondansetron, Treatment may vary due to age or medical condition, Dexamethasone and Midazolam  Airway Management Planned: LMA  Additional Equipment:   Intra-op Plan:   Post-operative Plan: Extubation in OR  Informed Consent: I have reviewed the patients History and Physical, chart, labs and discussed the procedure including the risks, benefits and alternatives for the proposed anesthesia with the patient or authorized representative who has indicated his/her understanding and acceptance.     Dental advisory given  Plan Discussed with: CRNA and Surgeon  Anesthesia Plan Comments:       Anesthesia Quick Evaluation

## 2020-03-15 NOTE — Progress Notes (Signed)
Anesthesia Chart Review   Case: [id number] Date/Time: 03/16/20 0715   Procedure: OPEN REDUCTION INTERNAL (ORIF) FIXATION PATELLA vs patellectomy (Left Knee) - femoral nerve block vs general   Anesthesia type: Spinal   Pre-op diagnosis: Left patella fracture   Location: [facility] ROOM 06 / [facility]   Surgeons: Sydnee Cabal, MD      DISCUSSION:79 y.o. never smoker with h/o HTN, HLD, Stroke 11/14/2019, PAF (on Eliquis), DM II, left patella fracture scheduled for above procedure 03/16/2020 with Dr. Sydnee Cabal.   Per cardiology 03/06/2020, "  TALMAGE TEASTER was last seen on 11/29/19 by Dr. Marlou Porch.  Since that day, MALEKI HIPPE has done well and can complete 4.0 METS without angina. Per our clinical pharmacist: Patient with diagnosis ofafib on Eliquisfor anticoagulation.  Procedure:left knee patella fixation and tendon repair Date of procedure:TBD CHADS2-VASc score of6(age x2, HTN, DM, stroke in Dec 2020) CrCl40m/min Platelet count172 Pt is at elevated cardiac risk due to recent stroke history in setting of afib. Prefer to minimize time off of anticoagulation as much as possible. Ideally would prefer 24 hour hold, if longer hold is needed, will need to defer to MD for input. Will need to resume Eliquis as soon as safely possible after procedure Per Dr. SMarlou Porch My recommendation would be to stop his Eliquis 36 hours prior to surgical procedure that morning given his recent stroke history. If this is not felt to be adequate by orthopedic surgery, we can have our pharmacy team help bridge with Lovenox Therefore, based on ACC/AHA guidelines, the patient would be at acceptable risk for the planned procedure without further cardiovascular testing."  Dr. CTheda Sersaware of Dr. SMarlou Porchrecommendations regarding holding Eliquis for only 36 hours. Will proceed with general anesthesia.    Pt has been experiencing episodes of hypoglycemia.  Blood glucose 68 at PAT visit.  Pt monitors glucose  closely and has been in contact with PCP. He understands diabetes medication instructions.  Will evaluate DOS.   Anticipate pt can proceed with planned procedure barring acute status change.   VS: BP (!) 131/52 (BP Location: Right Arm)   Pulse 67   Temp 36.9 C (Oral)   Resp 18   Ht _0  (1.803 m)   Wt 68.9 kg   SpO2 99%   BMI 21.20 kg/m   PROVIDERS: SReynold Bowen MD is PCP   SCandee Furbish MD is Cardiologist  LABS: Glucose 68, Creatine 1.58 (all labs ordered are listed, but only abnormal results are displayed)  Labs Reviewed - No data to display   IMAGES:   EKG: 11/15/2019 Rate 55 bpm  Sinus bradycardia Incomplete right bundle branch block  Minimal voltage criteria for LVH, may be normal variant (R in aVL) Septal infarct, age undetermined   CV: Echo 11/15/2019 IMPRESSIONS    1. Left ventricular ejection fraction, by visual estimation, is 60 to  65%. The left ventricle has normal function. There is no left ventricular  hypertrophy.  2. The left ventricle has no regional wall motion abnormalities.  3. Global right ventricle has normal systolic function.The right  ventricular size is normal. No increase in right ventricular wall  thickness.  4. Left atrial size was normal.  5. Right atrial size was normal.  6. Trivial pericardial effusion is present.  7. Mild mitral annular calcification.  8. The mitral valve is normal in structure. Trivial mitral valve  regurgitation. No evidence of mitral stenosis.  9. The tricuspid valve is normal in structure.  10. The aortic  valve is normal in structure. Aortic valve regurgitation is  trivial. No evidence of aortic valve sclerosis or stenosis.  11. The pulmonic valve was normal in structure. Pulmonic valve  regurgitation is not visualized.  12. TR signal is inadequate for assessing pulmonary artery systolic  pressure.  13. The inferior vena cava is normal in size with greater than 50%  respiratory  variability, suggesting right atrial pressure of 3 mmHg. Past Medical History:  Diagnosis Date  . Arthritis    left thumb  . Broken neck ([place])    in March 2015  . Cataract   . Colon cancer ([place])   . Colon polyp   . Diabetes mellitus    Type 1  since 1974  . Dysrhythmia    a fib  . Gout   . Hyperlipidemia   . Hypertension   . Lipoma of back   . Paroxysmal atrial fibrillation (HCC)   . Stroke [facility])    11/14/19  no defecit     Past Surgical History:  Procedure Laterality Date  . ANKLE SURGERY     right  . APPENDECTOMY  2001  . arm surgery     right shoulder surgery  rmoval of tendon  . CATARACT EXTRACTION EXTRACAPSULAR Bilateral   . COLON RESECTION    . ELBOW FRACTURE SURGERY    . HERNIA REPAIR     umbilica and left inguinal  . INGUINAL HERNIA REPAIR Left 03/13/2016   Procedure: LEFT INGUINAL HERNIA REPAIR WITH UNBILICAL HERNIA REPAIR;  Surgeon: [name], MD;  Location: [place];  Service: General;  Laterality: Left;  . INSERTION OF MESH Left 03/13/2016   Procedure: INSERTION OF MESH;  Surgeon: [name], MD;  Location: [place];  Service: General;  Laterality: Left;  [name] MASS EXCISION N/A 07/20/2019   Procedure: EXCISION OF BACK MASS;  Surgeon: Rolm Bookbinder, MD;  [place]  Service: General;  Laterality: N/A;  MAC AND LOCAL  . VASECTOMY      MEDICATIONS: . allopurinol (ZYLOPRIM) 100 MG tablet  . amLODipine (NORVASC) 5 MG tablet  . apixaban (ELIQUIS) 5 MG TABS tablet  . Ascorbic Acid (VITAMIN C) 1000 MG tablet  . aspirin EC 81 MG EC tablet  . Cholecalciferol (VITAMIN D3) 250 MCG (10000 UT) capsule  . Coenzyme Q10 (COQ10 PO)  . Continuous Blood Gluc Sensor (FREESTYLE LIBRE 14 DAY SENSOR) MISC  . Cyanocobalamin (VITAMIN B-12) 5000 MCG SUBL  . furosemide (LASIX) 40 MG tablet  . Hawthorn 565 MG CAPS  . hydrALAZINE (APRESOLINE) 50 MG tablet  . insulin aspart (NOVOLOG) 100 UNIT/ML injection  . insulin glargine (LANTUS) 100 UNIT/ML  injection  . lovastatin (MEVACOR) 20 MG tablet  . metoprolol succinate (TOPROL-XL) 25 MG 24 hr tablet  . Nutritional Supplements (DHEA PO)  . Omega 3-6-9 Fatty Acids (TRIPLE OMEGA-3-6-9 PO)  . Potassium 99 MG TABS  . ramipril (ALTACE) 10 MG capsule  . Saw Palmetto 450 MG CAPS  . vitamin E 1000 UNIT capsule  . Zinc 50 MG CAPS   No current facility-administered medications for this encounter.    Maia Plan [facility] Pre-Surgical Testing [phone] 03/15/20  10:29 AM

## 2020-03-16 ENCOUNTER — Other Ambulatory Visit: Payer: Self-pay

## 2020-03-16 ENCOUNTER — Inpatient Hospital Stay (HOSPITAL_COMMUNITY)
Admission: RE | Admit: 2020-03-16 | Discharge: 2020-03-18 | DRG: 489 | Disposition: A | Discharge status: Home Health | Payer: HMO | Attending: Specialist | Admitting: Specialist

## 2020-03-16 ENCOUNTER — Ambulatory Visit (HOSPITAL_COMMUNITY): Payer: HMO | Admitting: Certified Registered Nurse Anesthetist

## 2020-03-16 ENCOUNTER — Encounter (HOSPITAL_COMMUNITY): Payer: Self-pay | Admitting: Specialist

## 2020-03-16 ENCOUNTER — Encounter (HOSPITAL_COMMUNITY)
Admission: RE | Disposition: A | Discharge status: Home / Self Care | Payer: Self-pay | Source: Home / Self Care | Attending: Specialist

## 2020-03-16 DIAGNOSIS — Z794 Long term (current) use of insulin: Secondary | ICD-10-CM

## 2020-03-16 DIAGNOSIS — S82002A Unspecified fracture of left patella, initial encounter for closed fracture: Secondary | ICD-10-CM | POA: Diagnosis not present

## 2020-03-16 DIAGNOSIS — I10 Essential (primary) hypertension: Secondary | ICD-10-CM | POA: Diagnosis not present

## 2020-03-16 DIAGNOSIS — I480 Paroxysmal atrial fibrillation: Secondary | ICD-10-CM | POA: Diagnosis present

## 2020-03-16 DIAGNOSIS — W1830XA Fall on same level, unspecified, initial encounter: Secondary | ICD-10-CM | POA: Diagnosis present

## 2020-03-16 DIAGNOSIS — E785 Hyperlipidemia, unspecified: Secondary | ICD-10-CM | POA: Diagnosis present

## 2020-03-16 DIAGNOSIS — E10649 Type 1 diabetes mellitus with hypoglycemia without coma: Secondary | ICD-10-CM | POA: Diagnosis present

## 2020-03-16 DIAGNOSIS — Z7901 Long term (current) use of anticoagulants: Secondary | ICD-10-CM

## 2020-03-16 DIAGNOSIS — Z8249 Family history of ischemic heart disease and other diseases of the circulatory system: Secondary | ICD-10-CM

## 2020-03-16 DIAGNOSIS — Z7982 Long term (current) use of aspirin: Secondary | ICD-10-CM

## 2020-03-16 DIAGNOSIS — Z20822 Contact with and (suspected) exposure to covid-19: Secondary | ICD-10-CM | POA: Diagnosis present

## 2020-03-16 DIAGNOSIS — Z85038 Personal history of other malignant neoplasm of large intestine: Secondary | ICD-10-CM

## 2020-03-16 DIAGNOSIS — Z79899 Other long term (current) drug therapy: Secondary | ICD-10-CM

## 2020-03-16 DIAGNOSIS — S82042A Displaced comminuted fracture of left patella, initial encounter for closed fracture: Secondary | ICD-10-CM | POA: Diagnosis present

## 2020-03-16 DIAGNOSIS — G8918 Other acute postprocedural pain: Secondary | ICD-10-CM | POA: Diagnosis not present

## 2020-03-16 DIAGNOSIS — M19042 Primary osteoarthritis, left hand: Secondary | ICD-10-CM | POA: Diagnosis present

## 2020-03-16 DIAGNOSIS — Z8673 Personal history of transient ischemic attack (TIA), and cerebral infarction without residual deficits: Secondary | ICD-10-CM

## 2020-03-16 DIAGNOSIS — S82032A Displaced transverse fracture of left patella, initial encounter for closed fracture: Principal | ICD-10-CM | POA: Diagnosis present

## 2020-03-16 DIAGNOSIS — M109 Gout, unspecified: Secondary | ICD-10-CM | POA: Diagnosis present

## 2020-03-16 HISTORY — PX: ORIF PATELLA: SHX5033

## 2020-03-16 LAB — GLUCOSE, CAPILLARY
Glucose-Capillary: 148 mg/dL — ABNORMAL HIGH (ref 70–99)
Glucose-Capillary: 145 mg/dL — ABNORMAL HIGH (ref 70–99)
Glucose-Capillary: 148 mg/dL — ABNORMAL HIGH (ref 70–99)
Glucose-Capillary: 130 mg/dL — ABNORMAL HIGH (ref 70–99)
Glucose-Capillary: 259 mg/dL — ABNORMAL HIGH (ref 70–99)

## 2020-03-16 SURGERY — OPEN REDUCTION INTERNAL FIXATION (ORIF) PATELLA
Anesthesia: General | Site: Knee | Laterality: Left

## 2020-03-16 MED ORDER — PHENYLEPHRINE HCL-NACL 10-0.9 MG/250ML-% IV SOLN
INTRAVENOUS | Status: DC | PRN
  Administered 2020-03-16: 50 ug/min via INTRAVENOUS

## 2020-03-16 MED ORDER — PHENYLEPHRINE 40 MCG/ML (10ML) SYRINGE FOR IV PUSH (FOR BLOOD PRESSURE SUPPORT)
PREFILLED_SYRINGE | INTRAVENOUS | Status: DC | PRN
  Administered 2020-03-16 (×2): 80 ug via INTRAVENOUS

## 2020-03-16 MED ORDER — ONDANSETRON HCL 4 MG/2ML IJ SOLN: 4.0000 mg | Freq: Four times a day (QID) | INTRAMUSCULAR | Status: DC | PRN

## 2020-03-16 MED ORDER — LIDOCAINE 2% (20 MG/ML) 5 ML SYRINGE
INTRAMUSCULAR | Status: DC | PRN
  Administered 2020-03-16: 60 mg via INTRAVENOUS

## 2020-03-16 MED ORDER — OXYCODONE HCL 5 MG/5ML PO SOLN: 5.0000 mg | Freq: Once | ORAL | Status: DC | PRN

## 2020-03-16 MED ORDER — AMLODIPINE BESYLATE 5 MG PO TABS
5.0000 mg | ORAL_TABLET | Freq: Every day | ORAL | Status: DC
  Administered 2020-03-17 – 2020-03-18 (×2): 5 mg via ORAL
  Filled 2020-03-16 (×2): qty 1

## 2020-03-16 MED ORDER — INSULIN GLARGINE 100 UNIT/ML ~~LOC~~ SOLN
14.0000 [IU] | Freq: Every morning | SUBCUTANEOUS | Status: DC
  Administered 2020-03-17 – 2020-03-18 (×2): 14 [IU] via SUBCUTANEOUS
  Filled 2020-03-16 (×2): qty 0.14

## 2020-03-16 MED ORDER — ALLOPURINOL 100 MG PO TABS
100.0000 mg | ORAL_TABLET | Freq: Every day | ORAL | Status: DC
  Administered 2020-03-17 – 2020-03-18 (×2): 100 mg via ORAL
  Filled 2020-03-16 (×3): qty 1

## 2020-03-16 MED ORDER — BUPIVACAINE-EPINEPHRINE (PF) 0.5% -1:200000 IJ SOLN
INTRAMUSCULAR | Status: DC | PRN
  Administered 2020-03-16: 30 mL via PERINEURAL

## 2020-03-16 MED ORDER — APIXABAN 5 MG PO TABS
5.0000 mg | ORAL_TABLET | Freq: Two times a day (BID) | ORAL | Status: DC
  Administered 2020-03-17 – 2020-03-18 (×3): 5 mg via ORAL
  Filled 2020-03-16 (×3): qty 1

## 2020-03-16 MED ORDER — FENTANYL CITRATE (PF) 100 MCG/2ML IJ SOLN
25.0000 ug | INTRAMUSCULAR | Status: DC | PRN
Start: 2020-03-16 — End: 2020-03-16

## 2020-03-16 MED ORDER — METOPROLOL SUCCINATE ER 25 MG PO TB24
25.0000 mg | ORAL_TABLET | Freq: Every day | ORAL | Status: DC
  Administered 2020-03-17 – 2020-03-18 (×2): 25 mg via ORAL
  Filled 2020-03-16 (×2): qty 1

## 2020-03-16 MED ORDER — INSULIN GLARGINE 100 UNIT/ML ~~LOC~~ SOLN: 12.0000 [IU] | SUBCUTANEOUS | Status: DC

## 2020-03-16 MED ORDER — ONDANSETRON HCL 4 MG PO TABS: 4.0000 mg | ORAL_TABLET | Freq: Every day | ORAL | 1 refills | Status: DC | PRN

## 2020-03-16 MED ORDER — FENTANYL CITRATE (PF) 100 MCG/2ML IJ SOLN
INTRAMUSCULAR | Status: AC
  Filled 2020-03-16: qty 2

## 2020-03-16 MED ORDER — ACETAMINOPHEN 500 MG PO TABS
1000.0000 mg | ORAL_TABLET | Freq: Four times a day (QID) | ORAL | Status: AC
  Administered 2020-03-16 – 2020-03-17 (×3): 1000 mg via ORAL
  Filled 2020-03-16 (×5): qty 2

## 2020-03-16 MED ORDER — GABAPENTIN 300 MG PO CAPS
300.0000 mg | ORAL_CAPSULE | Freq: Three times a day (TID) | ORAL | Status: DC
  Administered 2020-03-16 – 2020-03-18 (×6): 300 mg via ORAL
  Filled 2020-03-16 (×7): qty 1

## 2020-03-16 MED ORDER — INSULIN ASPART 100 UNIT/ML ~~LOC~~ SOLN
10.0000 [IU] | Freq: Three times a day (TID) | SUBCUTANEOUS | Status: DC
  Administered 2020-03-16 – 2020-03-18 (×5): 10 [IU] via SUBCUTANEOUS

## 2020-03-16 MED ORDER — METHOCARBAMOL 500 MG PO TABS
500.0000 mg | ORAL_TABLET | Freq: Four times a day (QID) | ORAL | Status: DC | PRN
  Administered 2020-03-17: 500 mg via ORAL
  Filled 2020-03-16: qty 1

## 2020-03-16 MED ORDER — PROPOFOL 10 MG/ML IV BOLUS
INTRAVENOUS | Status: DC | PRN
  Administered 2020-03-16: 150 mg via INTRAVENOUS

## 2020-03-16 MED ORDER — OXYCODONE HCL 5 MG PO TABS: 10.0000 mg | ORAL_TABLET | ORAL | Status: DC | PRN

## 2020-03-16 MED ORDER — POVIDONE-IODINE 10 % EX SWAB: 2.0000 "application " | Freq: Once | CUTANEOUS | Status: DC

## 2020-03-16 MED ORDER — EPHEDRINE SULFATE-NACL 50-0.9 MG/10ML-% IV SOSY
PREFILLED_SYRINGE | INTRAVENOUS | Status: DC | PRN
  Administered 2020-03-16: 10 mg via INTRAVENOUS

## 2020-03-16 MED ORDER — METHOCARBAMOL 500 MG IVPB - SIMPLE MED
500.0000 mg | Freq: Four times a day (QID) | INTRAVENOUS | Status: DC | PRN
  Filled 2020-03-16: qty 50

## 2020-03-16 MED ORDER — ASPIRIN EC 81 MG PO TBEC
81.0000 mg | DELAYED_RELEASE_TABLET | Freq: Every day | ORAL | Status: DC
  Administered 2020-03-17 – 2020-03-18 (×2): 81 mg via ORAL
  Filled 2020-03-16 (×2): qty 1

## 2020-03-16 MED ORDER — FUROSEMIDE 20 MG PO TABS
20.0000 mg | ORAL_TABLET | Freq: Two times a day (BID) | ORAL | Status: DC
  Administered 2020-03-16 – 2020-03-18 (×4): 20 mg via ORAL
  Filled 2020-03-16 (×4): qty 1

## 2020-03-16 MED ORDER — DEXAMETHASONE SODIUM PHOSPHATE 10 MG/ML IJ SOLN
INTRAMUSCULAR | Status: DC | PRN
  Administered 2020-03-16: 5 mg via INTRAVENOUS

## 2020-03-16 MED ORDER — OXYCODONE HCL 5 MG PO TABS: 5.0000 mg | ORAL_TABLET | ORAL | 0 refills | Status: AC | PRN

## 2020-03-16 MED ORDER — PHENYLEPHRINE HCL (PRESSORS) 10 MG/ML IV SOLN
INTRAVENOUS | Status: AC
  Filled 2020-03-16: qty 1

## 2020-03-16 MED ORDER — OXYCODONE HCL 5 MG PO TABS
5.0000 mg | ORAL_TABLET | ORAL | Status: DC | PRN
  Administered 2020-03-17 – 2020-03-18 (×2): 5 mg via ORAL
  Filled 2020-03-16 (×2): qty 1

## 2020-03-16 MED ORDER — HYDROMORPHONE HCL 1 MG/ML IJ SOLN: 0.5000 mg | INTRAMUSCULAR | Status: DC | PRN

## 2020-03-16 MED ORDER — PROPOFOL 10 MG/ML IV BOLUS
INTRAVENOUS | Status: AC
  Filled 2020-03-16: qty 20

## 2020-03-16 MED ORDER — 0.9 % SODIUM CHLORIDE (POUR BTL) OPTIME
TOPICAL | Status: DC | PRN
  Administered 2020-03-16: 1000 mL

## 2020-03-16 MED ORDER — METHOCARBAMOL 500 MG PO TABS: 500.0000 mg | ORAL_TABLET | Freq: Four times a day (QID) | ORAL | 0 refills | Status: DC

## 2020-03-16 MED ORDER — PHENYLEPHRINE 40 MCG/ML (10ML) SYRINGE FOR IV PUSH (FOR BLOOD PRESSURE SUPPORT)
PREFILLED_SYRINGE | INTRAVENOUS | Status: AC
  Filled 2020-03-16: qty 10

## 2020-03-16 MED ORDER — ONDANSETRON HCL 4 MG/2ML IJ SOLN: 4.0000 mg | Freq: Once | INTRAMUSCULAR | Status: DC | PRN

## 2020-03-16 MED ORDER — FENTANYL CITRATE (PF) 100 MCG/2ML IJ SOLN
INTRAMUSCULAR | Status: DC | PRN
  Administered 2020-03-16 (×2): 50 ug via INTRAVENOUS
  Administered 2020-03-16 (×2): 25 ug via INTRAVENOUS
  Administered 2020-03-16: 50 ug via INTRAVENOUS

## 2020-03-16 MED ORDER — DEXAMETHASONE SODIUM PHOSPHATE 10 MG/ML IJ SOLN
INTRAMUSCULAR | Status: AC
  Filled 2020-03-16: qty 1

## 2020-03-16 MED ORDER — MAGNESIUM CITRATE PO SOLN: 1.0000 | Freq: Once | ORAL | Status: DC | PRN

## 2020-03-16 MED ORDER — OXYCODONE HCL 5 MG PO TABS: 5.0000 mg | ORAL_TABLET | Freq: Once | ORAL | Status: DC | PRN

## 2020-03-16 MED ORDER — ONDANSETRON HCL 4 MG/2ML IJ SOLN
INTRAMUSCULAR | Status: DC | PRN
  Administered 2020-03-16: 4 mg via INTRAVENOUS

## 2020-03-16 MED ORDER — SODIUM CHLORIDE 0.9 % IV SOLN: INTRAVENOUS | Status: DC

## 2020-03-16 MED ORDER — BISACODYL 5 MG PO TBEC: 5.0000 mg | DELAYED_RELEASE_TABLET | Freq: Every day | ORAL | Status: DC | PRN

## 2020-03-16 MED ORDER — PRAVASTATIN SODIUM 20 MG PO TABS
20.0000 mg | ORAL_TABLET | Freq: Every day | ORAL | Status: DC
  Administered 2020-03-17: 20 mg via ORAL
  Filled 2020-03-16: qty 1

## 2020-03-16 MED ORDER — MIDAZOLAM HCL 5 MG/5ML IJ SOLN
INTRAMUSCULAR | Status: DC | PRN
  Administered 2020-03-16: .5 mg via INTRAVENOUS
  Administered 2020-03-16: 1 mg via INTRAVENOUS
  Administered 2020-03-16: .5 mg via INTRAVENOUS

## 2020-03-16 MED ORDER — MIDAZOLAM HCL 2 MG/2ML IJ SOLN
INTRAMUSCULAR | Status: AC
  Filled 2020-03-16: qty 2

## 2020-03-16 MED ORDER — BUPIVACAINE HCL (PF) 0.25 % IJ SOLN
INTRAMUSCULAR | Status: AC
  Filled 2020-03-16: qty 30

## 2020-03-16 MED ORDER — DIPHENHYDRAMINE HCL 12.5 MG/5ML PO ELIX: 12.5000 mg | ORAL_SOLUTION | ORAL | Status: DC | PRN

## 2020-03-16 MED ORDER — TRAMADOL HCL 50 MG PO TABS
50.0000 mg | ORAL_TABLET | Freq: Four times a day (QID) | ORAL | Status: DC
  Administered 2020-03-16 – 2020-03-17 (×5): 50 mg via ORAL
  Filled 2020-03-16 (×7): qty 1

## 2020-03-16 MED ORDER — EPHEDRINE 5 MG/ML INJ
INTRAVENOUS | Status: AC
  Filled 2020-03-16: qty 10

## 2020-03-16 MED ORDER — BUPIVACAINE HCL (PF) 0.25 % IJ SOLN
INTRAMUSCULAR | Status: DC | PRN
  Administered 2020-03-16: 20 mL

## 2020-03-16 MED ORDER — CEFAZOLIN SODIUM-DEXTROSE 1-4 GM/50ML-% IV SOLN
1.0000 g | Freq: Three times a day (TID) | INTRAVENOUS | Status: AC
  Administered 2020-03-16 – 2020-03-17 (×3): 1 g via INTRAVENOUS
  Filled 2020-03-16 (×3): qty 50

## 2020-03-16 MED ORDER — CEFAZOLIN SODIUM-DEXTROSE 2-4 GM/100ML-% IV SOLN
2.0000 g | INTRAVENOUS | Status: AC
  Administered 2020-03-16: 2 g via INTRAVENOUS

## 2020-03-16 MED ORDER — HYDRALAZINE HCL 50 MG PO TABS
50.0000 mg | ORAL_TABLET | Freq: Two times a day (BID) | ORAL | Status: DC
  Administered 2020-03-16 – 2020-03-18 (×4): 50 mg via ORAL
  Filled 2020-03-16 (×4): qty 1

## 2020-03-16 MED ORDER — LACTATED RINGERS IV SOLN: INTRAVENOUS | Status: DC

## 2020-03-16 MED ORDER — ONDANSETRON HCL 4 MG PO TABS: 4.0000 mg | ORAL_TABLET | Freq: Four times a day (QID) | ORAL | Status: DC | PRN

## 2020-03-16 MED ORDER — ONDANSETRON HCL 4 MG/2ML IJ SOLN
INTRAMUSCULAR | Status: AC
  Filled 2020-03-16: qty 2

## 2020-03-16 MED ORDER — RAMIPRIL 10 MG PO CAPS
10.0000 mg | ORAL_CAPSULE | Freq: Two times a day (BID) | ORAL | Status: DC
  Administered 2020-03-17 – 2020-03-18 (×3): 10 mg via ORAL
  Filled 2020-03-16 (×3): qty 1

## 2020-03-16 MED ORDER — ACETAMINOPHEN 325 MG PO TABS: 325.0000 mg | ORAL_TABLET | Freq: Four times a day (QID) | ORAL | Status: DC | PRN

## 2020-03-16 MED ORDER — INSULIN GLARGINE 100 UNIT/ML ~~LOC~~ SOLN
12.0000 [IU] | Freq: Every day | SUBCUTANEOUS | Status: DC
  Administered 2020-03-16 – 2020-03-17 (×2): 12 [IU] via SUBCUTANEOUS
  Filled 2020-03-16 (×2): qty 0.12

## 2020-03-16 MED ORDER — SENNOSIDES-DOCUSATE SODIUM 8.6-50 MG PO TABS: 1.0000 | ORAL_TABLET | Freq: Every evening | ORAL | Status: DC | PRN

## 2020-03-16 MED ORDER — CEFAZOLIN SODIUM-DEXTROSE 2-4 GM/100ML-% IV SOLN
INTRAVENOUS | Status: AC
  Filled 2020-03-16: qty 100

## 2020-03-16 SURGICAL SUPPLY — 63 items
BAG ZIPLOCK 12X15 (MISCELLANEOUS) ×2 IMPLANT
BNDG COHESIVE 4X5 TAN STRL (GAUZE/BANDAGES/DRESSINGS) ×2 IMPLANT
BNDG COHESIVE 6X5 TAN STRL LF (GAUZE/BANDAGES/DRESSINGS) ×2 IMPLANT
BNDG ELASTIC 4X5.8 VLCR STR LF (GAUZE/BANDAGES/DRESSINGS) ×2 IMPLANT
BNDG ELASTIC 6X5.8 VLCR STR LF (GAUZE/BANDAGES/DRESSINGS) ×2 IMPLANT
BNDG GAUZE ELAST 4 BULKY (GAUZE/BANDAGES/DRESSINGS) ×2 IMPLANT
COVER SURGICAL LIGHT HANDLE (MISCELLANEOUS) ×2 IMPLANT
COVER WAND RF STERILE (DRAPES) IMPLANT
CUFF TOURN SGL QUICK 34 (TOURNIQUET CUFF) ×1
CUFF TRNQT CYL 34X4.125X (TOURNIQUET CUFF) ×1 IMPLANT
DERMABOND ADVANCED (GAUZE/BANDAGES/DRESSINGS) ×1
DERMABOND ADVANCED .7 DNX12 (GAUZE/BANDAGES/DRESSINGS) ×1 IMPLANT
DRAPE INCISE IOBAN 66X45 STRL (DRAPES) ×2 IMPLANT
DRAPE OEC MINIVIEW 54X84 (DRAPES) ×2 IMPLANT
DRAPE SHEET LG 3/4 BI-LAMINATE (DRAPES) ×2 IMPLANT
DRAPE U-SHAPE 47X51 STRL (DRAPES) ×2 IMPLANT
DRSG AQUACEL AG ADV 3.5X10 (GAUZE/BANDAGES/DRESSINGS) ×2 IMPLANT
DRSG EMULSION OIL 3X16 NADH (GAUZE/BANDAGES/DRESSINGS) ×2 IMPLANT
DURAPREP 26ML APPLICATOR (WOUND CARE) ×2 IMPLANT
DW OUTFLOW CASSETTE/TUBE SET (MISCELLANEOUS) ×2 IMPLANT
ELECT REM PT RETURN 15FT ADLT (MISCELLANEOUS) ×2 IMPLANT
GAUZE SPONGE 4X4 12PLY STRL (GAUZE/BANDAGES/DRESSINGS) ×4 IMPLANT
GLOVE BIOGEL PI IND STRL 8 (GLOVE) ×1 IMPLANT
GLOVE BIOGEL PI INDICATOR 8 (GLOVE) ×1
GLOVE SURG ORTHO 8.0 STRL STRW (GLOVE) ×2 IMPLANT
GLOVE SURG SS PI 7.5 STRL IVOR (GLOVE) ×2 IMPLANT
GOWN STRL REUS W/TWL XL LVL3 (GOWN DISPOSABLE) ×2 IMPLANT
IMMOBILIZER KNEE 20 (SOFTGOODS) ×2
IMMOBILIZER KNEE 20 THIGH 36 (SOFTGOODS) ×1 IMPLANT
KIT BASIN (CUSTOM PROCEDURE TRAY) ×2 IMPLANT
KIT TURNOVER KIT A (KITS) IMPLANT
MANIFOLD NEPTUNE II (INSTRUMENTS) ×2 IMPLANT
NEEDLE MA TROC 1/2 (NEEDLE) ×2 IMPLANT
NS IRRIG 1000ML POUR BTL (IV SOLUTION) ×2 IMPLANT
PACK ORTHO EXTREMITY (CUSTOM PROCEDURE TRAY) ×2 IMPLANT
PAD CAST 4YDX4 CTTN HI CHSV (CAST SUPPLIES) ×2 IMPLANT
PADDING CAST COTTON 4X4 STRL (CAST SUPPLIES) ×2
PADDING CAST COTTON 6X4 STRL (CAST SUPPLIES) ×4 IMPLANT
PASSER SUT SWANSON 36MM LOOP (INSTRUMENTS) ×2 IMPLANT
PENCIL SMOKE EVACUATOR (MISCELLANEOUS) IMPLANT
PROTECTOR NERVE ULNAR (MISCELLANEOUS) ×2 IMPLANT
SPLINT FIBERGLASS 5X30 (CAST SUPPLIES) ×50 IMPLANT
SPONGE LAP 18X18 RF (DISPOSABLE) ×4 IMPLANT
STOCKINETTE 8 INCH (MISCELLANEOUS) ×2 IMPLANT
STRIP CLOSURE SKIN 1/2X4 (GAUZE/BANDAGES/DRESSINGS) ×2 IMPLANT
SUCTION FRAZIER HANDLE 10FR (MISCELLANEOUS) ×1
SUCTION TUBE FRAZIER 10FR DISP (MISCELLANEOUS) ×1 IMPLANT
SUT ETHIBOND 2 V 37 (SUTURE) ×2 IMPLANT
SUT ETHIBOND NAB CT1 #1 30IN (SUTURE) ×4 IMPLANT
SUT ETHILON 4 0 PS 2 18 (SUTURE) ×2 IMPLANT
SUT FIBERWIRE #2 38 T-5 BLUE (SUTURE) ×6
SUT MNCRL AB 4-0 PS2 18 (SUTURE) ×2 IMPLANT
SUT STEEL 7 (SUTURE) ×6 IMPLANT
SUT VIC AB 1 CT1 27 (SUTURE) ×4
SUT VIC AB 1 CT1 27XBRD ANTBC (SUTURE) ×2 IMPLANT
SUT VIC AB 1 CT1 36 (SUTURE) ×2 IMPLANT
SUT VIC AB 2-0 CT1 27 (SUTURE) ×4
SUT VIC AB 2-0 CT1 TAPERPNT 27 (SUTURE) ×2 IMPLANT
SUTURE FIBERWR #2 38 T-5 BLUE (SUTURE) ×3 IMPLANT
TAPE STRIPS DRAPE STRL (GAUZE/BANDAGES/DRESSINGS) ×2 IMPLANT
TOWEL OR 17X26 10 PK STRL BLUE (TOWEL DISPOSABLE) ×4 IMPLANT
TUBING ARTHROSCOPY IRRIG 16FT (MISCELLANEOUS) ×2 IMPLANT
WATER STERILE IRR 1000ML POUR (IV SOLUTION) ×2 IMPLANT

## 2020-03-16 NOTE — Anesthesia Postprocedure Evaluation (Signed)
Anesthesia Post Note  Patient: Curtis Dunn  Procedure(s) Performed: [name] (Left Knee)     Patient location during evaluation: PACU Anesthesia Type: General Level of consciousness: awake and alert and oriented Pain management: pain level controlled Vital Signs Assessment: post-procedure vital signs reviewed and stable Respiratory status: spontaneous breathing, nonlabored ventilation and respiratory function stable Cardiovascular status: blood pressure returned to baseline and stable Postop Assessment: no apparent nausea or vomiting Anesthetic complications: no    Last Vitals:  Vitals:   03/16/20 1000 03/16/20 1015  BP: 122/61 (!) 122/58  Pulse: 66 60  Resp: 13 17  Temp:  36.5 C  SpO2: 99% 99%    Last Pain:  Vitals:   03/16/20 1000  TempSrc:   PainSc: 0-No pain                 Micha Erck A.

## 2020-03-16 NOTE — Transfer of Care (Addendum)
Immediate Anesthesia Transfer of Care Note  Patient: Curtis Dunn  Procedure(s) Performed: [name] (Left Knee)  Patient Location: PACU  Anesthesia Type:GA combined with regional for post-op pain  Level of Consciousness: drowsy and patient cooperative  Airway & Oxygen Therapy: Patient Spontanous Breathing and Patient connected to face mask oxygen  Post-op Assessment: Report given to RN and Post -op Vital signs reviewed and stable  Post vital signs: Reviewed and stable  Last Vitals:  Vitals Value Taken Time  BP 121/47 03/16/20 0938  Temp    Pulse 57 03/16/20 0941  Resp 16 03/16/20 0941  SpO2 100 % 03/16/20 0941  Vitals shown include unvalidated device data.  Last Pain:  Vitals:   03/16/20 0627  TempSrc: Oral      Patients Stated Pain Goal: 3 ([id number])  Complications: No apparent anesthesia complications

## 2020-03-16 NOTE — Op Note (Signed)
Dictated#  Preop diagnosis left knee comminuted displaced unstable patella fracture Postop diagnosis same Procedure left knee partial patellectomy  irrigation debridement of hematoma and fragments from the knee Surgeon Hart Robinsons, MD Assistant Jason Coop, PA-C Anesthesia abductor canal block with general Estimated blood loss minimal Tourniquet time 50 minutes Drains none Complications none Disposition PACU stable  Procedure patient was encountered in the holding area correct side was marked and signed appropriately chart reviewed and signed.  IV had been started and abductor canal block had been administered.  IV antibiotics were given within 1 hour of the incision time he was taken to the OR placed under general anesthesia at the anesthesiologist recommendation.  Left lower extremity elevated prepped with DuraPrep and draped into a sterile fashion timeout done confirm the left side exsanguinated with an Esmarch and tourniquet was inflated to 325 mmHg.  Midline incision was made through the skin and subcutaneous tissue the fracture was happened on about April 15 at this time describing the scar tissue present medial lateral soft tissue flaps were developed appropriate level the extensor retinaculum and fascia and capsule have been torn with a displaced patella fracture I then performed a meticulous inspection of the fracture found to be multiply comminuted fragments and markedly unstable and a repairable this point time the patella was excised sharply leaving the patella tendon and quadriceps tendon extensor mechanism soft tissue intact  The joint was then inspected it was copiously irrigated fragments and hematoma to make sure it was clear then mobilized the extensor mechanism proximal and distal towel clips were placed it was brought together with the knee in extension family without excessive tension and then beginning medial going lateral closure of the extensor mechanism including the  capsule was done with #1 Ethibond suture.  Following this a #2 Ethibond suture was done as a circumferential suture to support the repair and also give more of an appearance of the patella ACL was irrigated to closure. The fascia was then closed with Vicryl subcu Vicryl skin with a subcuticular Monocryl suture.  Dressing was applied after and instilling 15 cc of 0.25% Sensorcaine after confirming anesthesiologist anesthesia.  Following application of sterile dressing tourniquet was deflated.  He was then placed into a plaster splint in extension circulation was intact the foot and ankle in the case he tolerated procedure well.  He was awakened taken from the operating room to the PACU in stable condition.  He will be kept overnight and discharged tomorrow or Sunday based upon his recovery home with assistance and his daughter  Help with patient positioning prepping draping technical surgical assistance throughout the entire case wound closure application of splints Ms. Leeann Haus,pac  assistance was needed.

## 2020-03-16 NOTE — Anesthesia Procedure Notes (Signed)
Procedure Name: LMA Insertion Date/Time: 03/16/2020 7:54 AM Performed by: West Pugh, CRNA Pre-anesthesia Checklist: Patient identified, Emergency Drugs available, Suction available, Patient being monitored and Timeout performed Patient Re-evaluated:Patient Re-evaluated prior to induction Oxygen Delivery Method: Circle system utilized Preoxygenation: Pre-oxygenation with 100% oxygen Induction Type: IV induction LMA: LMA with gastric port inserted LMA Size: 4.0 Number of attempts: 1 Placement Confirmation: positive ETCO2 and breath sounds checked- equal and bilateral Tube secured with: Tape Dental Injury: Teeth and Oropharynx as per pre-operative assessment

## 2020-03-16 NOTE — Interval H&P Note (Signed)
History and Physical Interval Note:  03/16/2020 7:45 AM  Curtis Dunn  has presented today for surgery, with the diagnosis of Left patella fracture.  The various methods of treatment have been discussed with the patient and family. After consideration of risks, benefits and other options for treatment, the patient has consented to  Procedure(s) with comments: OPEN REDUCTION INTERNAL (ORIF) FIXATION PATELLA vs patellectomy (Left) - femoral nerve block vs general as a surgical intervention.  The patient's history has been reviewed, patient examined, no change in status, stable for surgery.  I have reviewed the patient's chart and labs.  Questions were answered to the patient's satisfaction.     Rasul Decola ANDREW

## 2020-03-16 NOTE — Anesthesia Procedure Notes (Signed)
Anesthesia Regional Block: Femoral nerve block   Pre-Anesthetic Checklist: ,, timeout performed, Correct Patient, Correct Site, Correct Laterality, Correct Procedure, Correct Position, site marked, Risks and benefits discussed,  Surgical consent,  Pre-op evaluation,  At surgeon's request and post-op pain management  Laterality: Left  Prep: chloraprep       Needles:  Injection technique: Single-shot  Needle Type: Echogenic Stimulator Needle     Needle Length: 9cm  Needle Gauge: 21   Needle insertion depth: 6 cm   Additional Needles:   Procedures:,,,, ultrasound used (permanent image in chart),,,,  Motor weakness within 15 minutes.  Narrative:  Start time: 03/16/2020 6:50 AM End time: 03/16/2020 6:55 AM Injection made incrementally with aspirations every 5 mL.  Performed by: Personally  Anesthesiologist: Josephine Igo, MD  Additional Notes: Timeout performed. Patient sedated. Relevant anatomy ID'd using [place]. Incremental 2-24ml injection of LA with frequent aspiration. Patient tolerated procedure well.        Left Femoral Nerve Block

## 2020-03-16 NOTE — H&P (Signed)
Curtis Dunn is an 79 y.o. male.   Chief Complaint: Left knee pain HPI: This is a pleasant 79 year old male who fell on 4/15 while walking around his neighborhood and landed on his left knee. He tried to get up but he was unable to put any weight on his leg and he was unable to straighten it. He came into the office the next day. Xrays were done and a patella fracture was noted. Patient was placed in a knee immobilizer.  Pain is about a 3/10 and a ache when noted. Patient is on Eliquis, and a known diabetic.   Past Medical History:  Diagnosis Date  . Arthritis    left thumb  . Broken neck ([place])    in March 2015  . Cataract   . Colon cancer ([place])   . Colon polyp   . Diabetes mellitus    Type 1  since 1974  . Dysrhythmia    a fib  . Gout   . Hyperlipidemia   . Hypertension   . Lipoma of back   . Paroxysmal atrial fibrillation (HCC)   . Stroke [facility])    11/14/19  no defecit     Past Surgical History:  Procedure Laterality Date  . ANKLE SURGERY     right  . APPENDECTOMY  2001  . arm surgery     right shoulder surgery  rmoval of tendon  . CATARACT EXTRACTION EXTRACAPSULAR Bilateral   . COLON RESECTION    . ELBOW FRACTURE SURGERY    . HERNIA REPAIR     umbilica and left inguinal  . INGUINAL HERNIA REPAIR Left 03/13/2016   Procedure: LEFT INGUINAL HERNIA REPAIR WITH UNBILICAL HERNIA REPAIR;  Surgeon: [name], MD;  Location: [place];  Service: General;  Laterality: Left;  . INSERTION OF MESH Left 03/13/2016   Procedure: INSERTION OF MESH;  Surgeon: [name], MD;  Location: [place];  Service: General;  Laterality: Left;  [name] MASS EXCISION N/A 07/20/2019   Procedure: EXCISION OF BACK MASS;  Surgeon: Rolm Bookbinder, MD;  Location: [place];  Service: General;  Laterality: N/A;  MAC AND LOCAL  . VASECTOMY      Family History  Problem Relation Age of Onset  . Heart disease Father   . Colon cancer Neg Hx   . Esophageal cancer Neg Hx   . Rectal cancer  Neg Hx   . Stomach cancer Neg Hx    Social History:  reports that he has never smoked. He has never used smokeless tobacco. He reports previous alcohol use. He reports that he does not use drugs.  Allergies: No Known Allergies  Medications Prior to Admission  Medication Sig Dispense Refill  . allopurinol (ZYLOPRIM) 100 MG tablet Take 100 mg by mouth daily with breakfast.     . amLODipine (NORVASC) 5 MG tablet Take 1 tablet by mouth once daily (Patient taking differently: Take 5 mg by mouth daily with breakfast. ) 90 tablet 3  . apixaban (ELIQUIS) 5 MG TABS tablet Take 1 tablet (5 mg total) by mouth 2 (two) times daily. 60 tablet 11  . Ascorbic Acid (VITAMIN C) 1000 MG tablet Take 1,000 mg by mouth every other day.     [name] aspirin EC 81 MG EC tablet Take 1 tablet (81 mg total) by mouth daily. (Patient taking differently: Take 81 mg by mouth daily with breakfast. ) 60 tablet 0  . Cholecalciferol (VITAMIN D3) 250 MCG (10000 UT) capsule Take 10,000  Units by mouth daily with breakfast.     . Coenzyme Q10 (COQ10 PO) Take 1 capsule by mouth daily with breakfast.    . Cyanocobalamin (VITAMIN B-12) 5000 MCG SUBL Take 5,000 mcg by mouth daily with breakfast.    . furosemide (LASIX) 40 MG tablet Take 20 mg by mouth 2 (two) times daily.     [name] Hawthorn 565 MG CAPS Take 565 mg by mouth daily with breakfast.    . hydrALAZINE (APRESOLINE) 50 MG tablet Take 1 tablet by mouth twice daily (Patient taking differently: Take 50 mg by mouth in the morning and at bedtime. Breakfast & supper) 180 tablet 3  . insulin aspart (NOVOLOG) 100 UNIT/ML injection Inject 10 Units into the skin 3 (three) times daily before meals.     . insulin glargine (LANTUS) 100 UNIT/ML injection Inject 0.12 mLs (12 Units total) into the skin 2 (two) times daily. Inject 12 units at breakfast and 10 units at bedtime (Patient taking differently: Inject 12 Units into the skin See admin instructions. Inject 14 units at breakfast and 12 units at  bedtime) 10 mL 11  . lovastatin (MEVACOR) 20 MG tablet Take 20 mg by mouth daily with breakfast.     . metoprolol succinate (TOPROL-XL) 25 MG 24 hr tablet Take 1 tablet by mouth once daily (Patient taking differently: Take 25 mg by mouth daily with breakfast. ) 90 tablet 3  . Nutritional Supplements (DHEA PO) Take 100 mg by mouth daily with breakfast.     . Omega 3-6-9 Fatty Acids (TRIPLE OMEGA-3-6-9 PO) Take 1 capsule by mouth daily with breakfast.    . Potassium 99 MG TABS Take 99 mg by mouth daily with breakfast.     . ramipril (ALTACE) 10 MG capsule Take 1 capsule by mouth twice daily (Patient taking differently: Take 10 mg by mouth in the morning and at bedtime. Breakfast & supper) 180 capsule 2  . Saw Palmetto 450 MG CAPS Take 900 mg by mouth daily with breakfast.    . vitamin E 1000 UNIT capsule Take 1,000 Units by mouth every evening.     . Zinc 50 MG CAPS Take 50 mg by mouth daily with breakfast.     . Continuous Blood Gluc Sensor (FREESTYLE LIBRE 14 DAY SENSOR) MISC USE TO MONITOR BLOOD GLUCOSE. CHANGE EVERY 14 DAYS.      Results for orders placed or performed during the hospital encounter of 03/16/20 (from the past 48 hour(s))  Glucose, capillary     Status: Abnormal   Collection Time: 03/16/20  6:29 AM  Result Value Ref Range   Glucose-Capillary 148 (H) 70 - 99 mg/dL    Comment: Glucose reference range applies only to samples taken after fasting for at least 8 hours.   No results found.  Review of Systems  All other systems reviewed and are negative.   Blood pressure 134/89, pulse 65, temperature 98.3 F (36.8 C), temperature source Oral, resp. rate 18, height _0  (1.803 m), weight 68.9 kg, SpO2 99 %. Physical Exam  Musculoskeletal:     Comments: On exam of his left knee there is a noticeable effusion and ecchymosis noted in the knee joint. No tenderness with palpation throughout the entirety of the left knee. He is unable to extend at the knee. He is unable to hold a  straight leg raise. Calf is supple. There is noticeable effusion in the lower extremity. Neurovascularly intact in his left lower extremity.    X-rays from the office  reviewed in the office today, 2 views. X-rays show a transverse comminuted fracture of the patella.  Assessment/Plan Left Transverse patella fracture:  Patient has a comminuted transverse patella fracture. He is unable to hold a straight leg raise. He has been cleared by his cardiologist for surgery. He is going to be having a left knee ORIF of the left patella vs a left knee patellectomy. Risks and benefits of surgery have been discussed. All questions encouraged and answered. He will be staying over night at least one night in the hospital. He will be going home and his daughter is going to be coming to help take care of his for the first few days.  He will follow up in the office in 2 weeks.   Drue Novel, PA 03/16/2020, 7:25 AM

## 2020-03-16 NOTE — Evaluation (Signed)
Physical Therapy Evaluation Patient Details Name: Curtis Dunn MRN: [id number] DOB: 1941/09/20 Today's Date: 03/16/2020   History of Present Illness  79 year old male who fell on 4/15 while walking around his neighborhood and landed on his left knee. s.p L knee partial patellectomy + I&D  hematoma. PMH: HTN, DM, CVA  Clinical Impression  Pt admitted with above diagnosis.  Pt doing well today, maintains NWB safely with RW -->amb ~50' with min assist, anticipate steady progress. Pt is very active and independent at baseline  Pt currently with functional limitations due to the deficits listed below (see PT Problem List). Pt will benefit from skilled PT to increase their independence and safety with mobility to allow discharge to the venue listed below.       Follow Up Recommendations Home health PT;Supervision for mobility/OOB(HHOT)    Equipment Recommendations  Rolling walker with 5" wheels    Recommendations for Other Services       Precautions / Restrictions Precautions Precautions: Fall Required Braces or Orthoses: Splint/Cast Splint/Cast: long leg bivalved cast LLE Restrictions Weight Bearing Restrictions: Yes LLE Weight Bearing: Non weight bearing      Mobility  Bed Mobility Overal bed mobility: Needs Assistance Bed Mobility: Supine to Sit     Supine to sit: Min guard     General bed mobility comments: for  safety  Transfers Overall transfer level: Needs assistance   Transfers: Sit to/from Stand Sit to Stand: Min guard         General transfer comment: cues for safety, hand placement; moves quickly from supine to sit but able to transition sit to stand following cues to sit on edge of chair and extend LLE  Ambulation/Gait      amb 50' with RW and min to min/guard assist           Stairs            Wheelchair Mobility    Modified Rankin (Stroke Patients Only)       Balance Overall balance assessment: History of Falls;Needs  assistance   Sitting balance-Leahy Scale: Good       Standing balance-Leahy Scale: Fair Standing balance comment: close guarding for safety                             Pertinent Vitals/Pain Pain Assessment: No/denies pain    Home Living Family/patient expects to be discharged to:: Private residence Living Arrangements: Children Available Help at Discharge: Personal care attendant Type of Home: House Home Access: Stairs to enter Entrance Stairs-Rails: Psychiatric nurse of Steps: 5-6 Home Layout: Two level Home Equipment: Crutches Additional Comments: pt has lift chair, will sleep down stairs until able to go    Prior Function Level of Independence: Independent               Hand Dominance        Extremity/Trunk Assessment   Upper Extremity Assessment Upper Extremity Assessment: Overall WFL for tasks assessed    Lower Extremity Assessment Lower Extremity Assessment: LLE deficits/detail LLE Deficits / Details: moves L ankle through ~ 7 degree AROM, hip flexion ~ 2+/5 LLE: Unable to fully assess due to immobilization       Communication      Cognition Arousal/Alertness: Awake/alert Behavior During Therapy: WFL for tasks assessed/performed Overall Cognitive Status: Within Functional Limits for tasks assessed  General Comments      Exercises General Exercises - Lower Extremity Ankle Circles/Pumps: AROM;Both;5 reps;Limitations Ankle Circles/Pumps Limitations: L ankle ltd ROM   Assessment/Plan    PT Assessment Patient needs continued PT services  PT Problem List Decreased strength;Decreased activity tolerance;Decreased balance;Decreased knowledge of use of DME;Decreased mobility       PT Treatment Interventions DME instruction;Gait training;Stair training;Functional mobility training;Therapeutic activities;Patient/family education;Therapeutic exercise    PT Goals  (Current goals can be found in the Care Plan section)  Acute Rehab PT Goals Patient Stated Goal: get back to independence PT Goal Formulation: With patient Time For Goal Achievement: 03/23/20 Potential to Achieve Goals: Good    Frequency Min 6X/week   Barriers to discharge        Co-evaluation               AM-PAC PT "6 Clicks" Mobility  Outcome Measure Help needed turning from your back to your side while in a flat bed without using bedrails?: A Little Help needed moving from lying on your back to sitting on the side of a flat bed without using bedrails?: A Little Help needed moving to and from a bed to a chair (including a wheelchair)?: A Little Help needed standing up from a chair using your arms (e.g., wheelchair or bedside chair)?: A Little Help needed to walk in hospital room?: A Little Help needed climbing 3-5 steps with a railing? : A Little 6 Click Score: 18    End of Session Equipment Utilized During Treatment: Gait belt Activity Tolerance: Patient tolerated treatment well Patient left: with call bell/phone within reach;in chair;with chair alarm set;with family/visitor present Nurse Communication: Mobility status PT Visit Diagnosis: Difficulty in walking, not elsewhere classified (R26.2)    Time: [id number] PT Time Calculation (min) (ACUTE ONLY): 34 min   Charges:   PT Evaluation $PT Eval Low Complexity: 1 Low PT Treatments $Gait Training: 8-22 mins        Baxter Flattery, PT   Acute Rehab Dept [facility]): [id number]   03/16/2020   [facility] 03/16/2020, 3:38 PM

## 2020-03-16 NOTE — Discharge Instructions (Signed)
Please resume home Medications Please leave dressings on dry and intact until follow up appointment in 2 weeks Toe touch weight bearing in splints Okay to move toes and ankles  Ice and elevate extremity Okay to take 1000 mg of Tylenol up to 3 times a day, okay to take 10 mg of oxycodone if needed q 4-6 hours in the beginning to help with pain

## 2020-03-17 DIAGNOSIS — Z794 Long term (current) use of insulin: Secondary | ICD-10-CM | POA: Diagnosis not present

## 2020-03-17 DIAGNOSIS — Z7982 Long term (current) use of aspirin: Secondary | ICD-10-CM | POA: Diagnosis not present

## 2020-03-17 DIAGNOSIS — W1830XA Fall on same level, unspecified, initial encounter: Secondary | ICD-10-CM | POA: Diagnosis present

## 2020-03-17 DIAGNOSIS — E785 Hyperlipidemia, unspecified: Secondary | ICD-10-CM | POA: Diagnosis present

## 2020-03-17 DIAGNOSIS — S82032A Displaced transverse fracture of left patella, initial encounter for closed fracture: Secondary | ICD-10-CM | POA: Diagnosis present

## 2020-03-17 DIAGNOSIS — I480 Paroxysmal atrial fibrillation: Secondary | ICD-10-CM | POA: Diagnosis present

## 2020-03-17 DIAGNOSIS — Z8673 Personal history of transient ischemic attack (TIA), and cerebral infarction without residual deficits: Secondary | ICD-10-CM | POA: Diagnosis not present

## 2020-03-17 DIAGNOSIS — Z8249 Family history of ischemic heart disease and other diseases of the circulatory system: Secondary | ICD-10-CM | POA: Diagnosis not present

## 2020-03-17 DIAGNOSIS — Z7901 Long term (current) use of anticoagulants: Secondary | ICD-10-CM | POA: Diagnosis not present

## 2020-03-17 DIAGNOSIS — I10 Essential (primary) hypertension: Secondary | ICD-10-CM | POA: Diagnosis present

## 2020-03-17 DIAGNOSIS — S82042A Displaced comminuted fracture of left patella, initial encounter for closed fracture: Secondary | ICD-10-CM | POA: Diagnosis present

## 2020-03-17 DIAGNOSIS — M19042 Primary osteoarthritis, left hand: Secondary | ICD-10-CM | POA: Diagnosis present

## 2020-03-17 DIAGNOSIS — Z85038 Personal history of other malignant neoplasm of large intestine: Secondary | ICD-10-CM | POA: Diagnosis not present

## 2020-03-17 DIAGNOSIS — Z20822 Contact with and (suspected) exposure to covid-19: Secondary | ICD-10-CM | POA: Diagnosis present

## 2020-03-17 DIAGNOSIS — Z79899 Other long term (current) drug therapy: Secondary | ICD-10-CM | POA: Diagnosis not present

## 2020-03-17 DIAGNOSIS — E10649 Type 1 diabetes mellitus with hypoglycemia without coma: Secondary | ICD-10-CM | POA: Diagnosis present

## 2020-03-17 DIAGNOSIS — M109 Gout, unspecified: Secondary | ICD-10-CM | POA: Diagnosis present

## 2020-03-17 LAB — GLUCOSE, CAPILLARY
Glucose-Capillary: 378 mg/dL — ABNORMAL HIGH (ref 70–99)
Glucose-Capillary: 274 mg/dL — ABNORMAL HIGH (ref 70–99)
Glucose-Capillary: 36 mg/dL — CL (ref 70–99)
Glucose-Capillary: 40 mg/dL — CL (ref 70–99)
Glucose-Capillary: 75 mg/dL (ref 70–99)
Glucose-Capillary: 218 mg/dL — ABNORMAL HIGH (ref 70–99)

## 2020-03-17 MED ORDER — GLUCOSE 40 % PO GEL: 1.0000 | Freq: Once | ORAL | Status: DC

## 2020-03-17 MED ORDER — GLUCOSE 40 % PO GEL
ORAL | Status: AC
  Administered 2020-03-17: 1 via ORAL
  Filled 2020-03-17: qty 1

## 2020-03-17 NOTE — Progress Notes (Signed)
Subjective: 1 Day Post-Op Procedure(s) (LRB): [name] (Left) Patient reports pain as 0 on 0-10 scale.   Tolerating PO w/o N/V Due to void -Flatus, - BM -CP, Calf pain, SOB Has been up with PT.    Objective: Vital signs in last 24 hours: Temp:  [97.6 F (36.4 C)-98.7 F (37.1 C)] 98.1 F (36.7 C) (05/01 0621) Pulse Rate:  [53-66] 53 (05/01 0621) Resp:  [10-18] 16 (05/01 0621) BP: (106-128)/(43-62) 111/53 (05/01 0621) SpO2:  [94 %-100 %] 94 % (05/01 0621)  Intake/Output from previous day: 04/30 0701 - 05/01 0700 In: 3076.7 [P.O.:1040; I.V.:1836.7; IV Piggyback:200] Out: 1320 [Urine:1270; Blood:50] Intake/Output this shift: Total I/O In: 290 [P.O.:240; IV Piggyback:50] Out: -   Recent Labs    03/14/20 1344  HGB 11.4*   Recent Labs    03/14/20 1344  WBC 8.9  RBC 3.53*  HCT 35.7*  PLT 334   Recent Labs    03/14/20 1344  NA 141  K 5.0  CL 106  CO2 28  BUN 39*  CREATININE 1.58*  GLUCOSE 68*  CALCIUM 10.0   No results for input(s): LABPT, INR in the last 72 hours.  Neurologically intact ABD soft Neurovascular intact Sensation intact distally Intact pulses distally Splint left in place. Patient has distal sensation and movement of toes.  Assessment/Plan: 1 Day Post-Op Procedure(s) (LRB): [name] (Left) Advance diet Up with therapy Plan for discharge tomorrow   Plan is to restart Eliquis today.  Patient is eager to D/C home today, I spoke with Elizabeth Sauer PA-C who states she would like patient to work with PT 2X today and D/C tomorrow morning. I will change Observation order to inpatient.      Yvonne Kendall Sravya Grissom 03/17/2020, 8:31 AM

## 2020-03-17 NOTE — Progress Notes (Signed)
Pt stable at this time after low blood sugar event. With beginning evening check blood glucose was 36-40 (repeated four times), pt had no s/s of low blood sugar. Pt given ice cream, orange juice and glucose gel. Recheck of blood glucose was 75. Pt remains stable with no s/s of distress, talking to nursing staff well throughout event with no pallor or clammy skin. Rn will continue to monitor and is attempting to notify [facility] PA on call.

## 2020-03-17 NOTE — Plan of Care (Signed)
  Problem: Clinical Measurements: Goal: Respiratory complications will improve Outcome: Progressing   Problem: Clinical Measurements: Goal: Cardiovascular complication will be avoided Outcome: Progressing   Problem: Clinical Measurements: Goal: Cardiovascular complication will be avoided Outcome: Progressing   Problem: Activity: Goal: Risk for activity intolerance will decrease Outcome: Progressing   Problem: Coping: Goal: Level of anxiety will decrease Outcome: Progressing   Problem: Elimination: Goal: Will not experience complications related to bowel motility Outcome: Progressing

## 2020-03-17 NOTE — TOC Progression Note (Signed)
Transition of Care [facility]) - Progression Note    Patient Details  Name: Curtis Dunn MRN: [id number] Date of Birth: 1941/08/13  [facility]) CM/SW Contact  Joaquin Courts, RN Phone Number: 03/17/2020, 12:12 PM  Clinical Narrative:    CM spoke with patient at bedside.  [facility] to provide HHPT/OT services.  Patient has access to rolling walker at home and does not feel he needs a 3in1.    Expected Discharge Plan: [place] Barriers to Discharge: Continued Medical Work up  Expected Discharge Plan and Services Expected Discharge Plan: [place]   Discharge Planning Services: CM Consult Post Acute Care Choice: [place] arrangements for the past 2 months: Single Family Home                 DME Arranged: N/A DME Agency: NA       HH Arranged: PT, OT HH Agency: [facility] [employer] Date [place]: 03/17/20 Time [place]: 1208 Representative spoke with at [place]: [place] ([place]) Interventions    Readmission Risk Interventions No flowsheet data found.

## 2020-03-17 NOTE — Progress Notes (Signed)
Amanda Ward Pa called to clarify pt insulin order as pt take more insulin at home with a sliding scale based on his calculations. No changes were made to orders.

## 2020-03-17 NOTE — Progress Notes (Signed)
Physical Therapy Treatment Patient Details Name: Curtis Dunn MRN: [id number] DOB: 15-May-1941 Today's Date: 03/17/2020    History of Present Illness 79 year old male who fell on 4/15 while walking around his neighborhood and landed on his left knee. s.p L knee partial patellectomy + I&D  hematoma. PMH: HTN, DM, CVA    PT Comments    Mr. Troost continues to progress well. incr gait distance this pm. He is having some posterior lower leg pain/achilles tendon area where the cast ends. Elevated heel to decr pressure from cast and loosened ACE wrap.  Advised to observe for and incr pain, numbness, or tingling in foot. Cast is bivalved,   Follow Up Recommendations  Home health PT;Supervision for mobility/OOB(HHOT)     Equipment Recommendations  Rolling walker with 5" wheels    Recommendations for Other Services       Precautions / Restrictions Precautions Precautions: Fall Required Braces or Orthoses: Splint/Cast Splint/Cast: long leg bivalved cast LLE Restrictions Weight Bearing Restrictions: No LLE Weight Bearing: Non weight bearing    Mobility  Bed Mobility   Bed Mobility: Supine to Sit;Sit to Supine     Supine to sit: Supervision Sit to supine: Supervision   General bed mobility comments: for  safety  Transfers Overall transfer level: Needs assistance Equipment used: Rolling walker (2 wheeled) Transfers: Sit to/from Stand Sit to Stand: Min guard         General transfer comment: for safety  Ambulation/Gait Ambulation/Gait assistance: Min guard Gait Distance (Feet): 160 Feet Assistive device: Rolling walker (2 wheeled)       General Gait Details: intermittent cues for NWB, RW position.    Stairs             Wheelchair Mobility    Modified Rankin (Stroke Patients Only)       Balance             Standing balance-Leahy Scale: Fair Standing balance comment: close guarding for safety                             Cognition Arousal/Alertness: Awake/alert Behavior During Therapy: WFL for tasks assessed/performed Overall Cognitive Status: Within Functional Limits for tasks assessed                                        Exercises General Exercises - Lower Extremity Ankle Circles/Pumps: AROM;Both;5 reps;Limitations    General Comments        Pertinent Vitals/Pain Pain Assessment: 0-10 Pain Score: 1  Pain Location: posterior achilles tendon Pain Descriptors / Indicators: Sore Pain Intervention(s): Limited activity within patient's tolerance;Monitored during session;Other (comment)(R heel elevated to relieve heel cord pressure, loosened ACE)    Home Living                      Prior Function            PT Goals (current goals can now be found in the care plan section) Acute Rehab PT Goals Patient Stated Goal: get back to independence PT Goal Formulation: With patient Time For Goal Achievement: 03/23/20 Potential to Achieve Goals: Good Progress towards PT goals: Progressing toward goals    Frequency    Min 6X/week      PT Plan Current plan remains appropriate    Co-evaluation  AM-PAC PT "6 Clicks" Mobility   Outcome Measure  Help needed turning from your back to your side while in a flat bed without using bedrails?: None Help needed moving from lying on your back to sitting on the side of a flat bed without using bedrails?: None Help needed moving to and from a bed to a chair (including a wheelchair)?: A Little Help needed standing up from a chair using your arms (e.g., wheelchair or bedside chair)?: A Little Help needed to walk in hospital room?: A Little Help needed climbing 3-5 steps with a railing? : A Little 6 Click Score: 20    End of Session Equipment Utilized During Treatment: Gait belt Activity Tolerance: Patient tolerated treatment well Patient left: with call bell/phone within reach;in bed;with bed alarm set Nurse  Communication: Mobility status PT Visit Diagnosis: Difficulty in walking, not elsewhere classified (R26.2)     Time: [id number] PT Time Calculation (min) (ACUTE ONLY): 33 min  Charges:  $Gait Training: 23-37 mins                     Mayola Mcbain, PT   Acute Rehab Dept [facility]): [id number]   03/17/2020    [facility] 03/17/2020, 3:31 PM

## 2020-03-17 NOTE — Progress Notes (Signed)
Physical Therapy Treatment Patient Details Name: Curtis Dunn MRN: [id number] DOB: 31-Aug-1941 Today's Date: 03/17/2020    History of Present Illness 79 year old male who fell on 4/15 while walking around his neighborhood and landed on his left knee. s.p L knee partial patellectomy + I&D  hematoma. PMH: HTN, DM, CVA    PT Comments    Pt progressing very well incr gait distance today.  Will continue to follow  Ina cute setting   Follow Up Recommendations  Home health PT;Supervision for mobility/OOB(HHOT)     Equipment Recommendations  Rolling walker with 5" wheels    Recommendations for Other Services       Precautions / Restrictions Precautions Precautions: Fall Required Braces or Orthoses: Splint/Cast Splint/Cast: long leg bivalved cast LLE Restrictions LLE Weight Bearing: Non weight bearing    Mobility  Bed Mobility   Bed Mobility: Supine to Sit     Supine to sit: Supervision     General bed mobility comments: for  safety  Transfers Overall transfer level: Needs assistance Equipment used: Rolling walker (2 wheeled) Transfers: Sit to/from Stand Sit to Stand: Min guard         General transfer comment: for safety  Ambulation/Gait Ambulation/Gait assistance: Min guard Gait Distance (Feet): 100 Feet Assistive device: Rolling walker (2 wheeled)       General Gait Details: cues for NWB, RW position. one brief  standing rest    Stairs             Wheelchair Mobility    Modified Rankin (Stroke Patients Only)       Balance                                            Cognition Arousal/Alertness: Awake/alert Behavior During Therapy: WFL for tasks assessed/performed Overall Cognitive Status: Within Functional Limits for tasks assessed                                        Exercises      General Comments        Pertinent Vitals/Pain Pain Assessment: 0-10 Pain Score: 0-No pain    Home  Living                      Prior Function            PT Goals (current goals can now be found in the care plan section) Acute Rehab PT Goals Patient Stated Goal: get back to independence PT Goal Formulation: With patient Time For Goal Achievement: 03/23/20 Potential to Achieve Goals: Good Progress towards PT goals: Progressing toward goals    Frequency    Min 6X/week      PT Plan Current plan remains appropriate    Co-evaluation              AM-PAC PT "6 Clicks" Mobility   Outcome Measure  Help needed turning from your back to your side while in a flat bed without using bedrails?: A Little Help needed moving from lying on your back to sitting on the side of a flat bed without using bedrails?: None Help needed moving to and from a bed to a chair (including a wheelchair)?: A Little Help needed standing up from a chair using your  arms (e.g., wheelchair or bedside chair)?: A Little Help needed to walk in hospital room?: A Little Help needed climbing 3-5 steps with a railing? : A Little 6 Click Score: 19    End of Session Equipment Utilized During Treatment: Gait belt Activity Tolerance: Patient tolerated treatment well Patient left: with call bell/phone within reach;in bed;with bed alarm set Nurse Communication: Mobility status PT Visit Diagnosis: Difficulty in walking, not elsewhere classified (R26.2)     Time: [id number] PT Time Calculation (min) (ACUTE ONLY): 18 min  Charges:  $Gait Training: 8-22 mins                     Laurice Iglesia, PT   Acute Rehab Dept [facility]): [id number]   03/17/2020    [facility] 03/17/2020, 11:43 AM

## 2020-03-18 LAB — GLUCOSE, CAPILLARY: Glucose-Capillary: 120 mg/dL — ABNORMAL HIGH (ref 70–99)

## 2020-03-18 NOTE — Progress Notes (Signed)
Physical Therapy Treatment Patient Details Name: Curtis Dunn MRN: [id number] DOB: January 31, 1941 Today's Date: 03/18/2020    History of Present Illness 79 year old male who fell on 4/15 while walking around his neighborhood and landed on his left knee. s.p L knee partial patellectomy + I&D  hematoma. PMH: HTN, DM, CVA    PT Comments    Pt progressing well. Reviewed gait, stairs, overall safety, healing process and time. Pt ready for d/c from PT standpoint.   Follow Up Recommendations  Home health PT;Supervision for mobility/OOB     Equipment Recommendations  Rolling walker with 5" wheels(pt to borrow-?)    Recommendations for Other Services       Precautions / Restrictions Precautions Precautions: Fall Required Braces or Orthoses: Splint/Cast Splint/Cast: long leg bivalved cast LLE Restrictions LLE Weight Bearing: Non weight bearing Other Position/Activity Restrictions: to TDWB per PA note today     Mobility  Bed Mobility   Bed Mobility: Supine to Sit;Sit to Supine     Supine to sit: Supervision Sit to supine: Supervision   General bed mobility comments: for  safety  Transfers Overall transfer level: Needs assistance Equipment used: Rolling walker (2 wheeled) Transfers: Sit to/from Stand Sit to Stand: Supervision         General transfer comment: for safety  Ambulation/Gait Ambulation/Gait assistance: Min guard;Supervision Gait Distance (Feet): 160 Feet Assistive device: Rolling walker (2 wheeled)       General Gait Details: intermittent cues for NWB, RW position. good stability with RW, no LOB   Stairs Stairs: Yes Stairs assistance: Min guard Stair Management: One rail Right;One rail Left;Step to pattern;With crutches;Forwards Number of Stairs: 5(x2) General stair comments: cues for technique and safety    Wheelchair Mobility    Modified Rankin (Stroke Patients Only)       Balance               Standing balance comment: close  guarding for safety                            Cognition Arousal/Alertness: Awake/alert Behavior During Therapy: WFL for tasks assessed/performed Overall Cognitive Status: Within Functional Limits for tasks assessed                                        Exercises      General Comments        Pertinent Vitals/Pain Pain Assessment: No/denies pain    Home Living                      Prior Function            PT Goals (current goals can now be found in the care plan section) Acute Rehab PT Goals Patient Stated Goal: get back to independence PT Goal Formulation: With patient Time For Goal Achievement: 03/23/20 Potential to Achieve Goals: Good Progress towards PT goals: Progressing toward goals    Frequency    Min 6X/week      PT Plan Current plan remains appropriate    Co-evaluation              AM-PAC PT "6 Clicks" Mobility   Outcome Measure  Help needed turning from your back to your side while in a flat bed without using bedrails?: None Help needed moving from lying on your back  to sitting on the side of a flat bed without using bedrails?: None Help needed moving to and from a bed to a chair (including a wheelchair)?: A Little Help needed standing up from a chair using your arms (e.g., wheelchair or bedside chair)?: A Little Help needed to walk in hospital room?: A Little Help needed climbing 3-5 steps with a railing? : A Little 6 Click Score: 20    End of Session   Activity Tolerance: Patient tolerated treatment well Patient left: with call bell/phone within reach;in bed;with bed alarm set Nurse Communication: Mobility status PT Visit Diagnosis: Difficulty in walking, not elsewhere classified (R26.2)     Time: [id number] PT Time Calculation (min) (ACUTE ONLY): 23 min  Charges:  $Gait Training: 23-37 mins                     Claude Waldman, PT   Acute Rehab Dept [facility]):  [id number]   03/18/2020    [facility] 03/18/2020, 10:22 AM

## 2020-03-18 NOTE — Progress Notes (Signed)
   Subjective: 2 Days Post-Op Procedure(s) (LRB): Curtis (Left) Patient reports pain as mild.   Patient seen in rounds for Dr. Theda Sers. Patient is well, and has had no acute complaints or problems. He did have an episode of low blood glucose at ~40 last night, which was asymptomatic and improved with ice cream and orange juice. He is known type 1 diabetic, and states he manages his sugars well at home. He denies CP, SHOB, N/V. Voiding without difficulty. He states he is ready to go home today. We will continue therapy today.   Objective: Vital signs in last 24 hours: Temp:  [98.1 F (36.7 C)-98.5 F (36.9 C)] 98.1 F (36.7 C) (05/02 0557) Pulse Rate:  [58-61] 58 (05/02 0557) Resp:  [18-20] 20 (05/02 0557) BP: (106-142)/(46-73) 142/73 (05/02 0557) SpO2:  [94 %-97 %] 95 % (05/02 0557)  Intake/Output from previous day:  Intake/Output Summary (Last 24 hours) at 03/18/2020 0824 Last data filed at 03/18/2020 0600 Gross per 24 hour  Intake 1320 ml  Output 1450 ml  Net -130 ml     Intake/Output this shift: No intake/output data recorded.  Labs: No results for input(s): HGB in the last 72 hours. No results for input(s): WBC, RBC, HCT, PLT in the last 72 hours. No results for input(s): NA, K, CL, CO2, BUN, CREATININE, GLUCOSE, CALCIUM in the last 72 hours. No results for input(s): LABPT, INR in the last 72 hours.  Exam: General - Patient is Alert and Oriented Extremity - Neurologically intact Sensation intact distally Intact pulses distally Dorsiflexion/Plantar flexion intact Dressing - dressing C/D/I Motor Function - intact, moving foot and toes well on exam.   Past Medical History:  Diagnosis Date  . Arthritis    left thumb  . Broken neck ([place])    in March 2015  . Cataract   . Colon cancer ([place])   . Colon polyp   . Diabetes mellitus    Type 1  since 1974  . Dysrhythmia    a fib  . Gout   . Hyperlipidemia   . Hypertension   . Lipoma of back   . Paroxysmal  atrial fibrillation (HCC)   . Stroke ([place])    11/14/19  no defecit     Assessment/Plan: 2 Days Post-Op Procedure(s) (LRB): Curtis (Left) Active Problems:   Displaced comminuted fracture of left patella, initial encounter for closed fracture  Estimated body mass index is 21.19 kg/m as calculated from the following:   Height as of this encounter: 5\' 11"  (1.803 m).   Weight as of this encounter: 68.9 kg. Advance diet Up with therapy D/C IV fluids  DVT Prophylaxis - Eliquis TDWB to the LLE  Plan is to go Home after hospital stay. Plan for discharge today following 1 session of therapy as long as he is meeting his goals. He will remain in the splint and TDWB until follow up. Follow up in the office in 2 weeks.   Curtis Citron, PA-C Orthopedic Surgery [phone] 03/18/2020, 8:24 AM

## 2020-03-18 NOTE — Plan of Care (Signed)
  Problem: Education: Goal: Required Educational Video(s) Outcome: Adequate for Discharge   Problem: Clinical Measurements: Goal: Postoperative complications will be avoided or minimized Outcome: Adequate for Discharge   Problem: Skin Integrity: Goal: Demonstration of wound healing without infection will improve Outcome: Adequate for Discharge   Problem: Education: Goal: Knowledge of General Education information will improve Description: Including pain rating scale, medication(s)/side effects and non-pharmacologic comfort measures Outcome: Adequate for Discharge   Problem: Health Behavior/Discharge Planning: Goal: Ability to manage health-related needs will improve Outcome: Adequate for Discharge   Problem: Clinical Measurements: Goal: Ability to maintain clinical measurements within normal limits will improve Outcome: Adequate for Discharge Goal: Will remain free from infection Outcome: Adequate for Discharge Goal: Diagnostic test results will improve Outcome: Adequate for Discharge Goal: Respiratory complications will improve Outcome: Adequate for Discharge Goal: Cardiovascular complication will be avoided Outcome: Adequate for Discharge   Problem: Activity: Goal: Risk for activity intolerance will decrease Outcome: Adequate for Discharge   Problem: Nutrition: Goal: Adequate nutrition will be maintained Outcome: Adequate for Discharge   Problem: Coping: Goal: Level of anxiety will decrease Outcome: Adequate for Discharge   Problem: Elimination: Goal: Will not experience complications related to bowel motility Outcome: Adequate for Discharge Goal: Will not experience complications related to urinary retention Outcome: Adequate for Discharge   Problem: Pain Managment: Goal: General experience of comfort will improve Outcome: Adequate for Discharge   Problem: Safety: Goal: Ability to remain free from injury will improve Outcome: Adequate for Discharge    Problem: Skin Integrity: Goal: Risk for impaired skin integrity will decrease Outcome: Adequate for Discharge   

## 2020-03-19 ENCOUNTER — Encounter: Payer: Self-pay | Admitting: *Deleted

## 2020-03-20 DIAGNOSIS — E109 Type 1 diabetes mellitus without complications: Secondary | ICD-10-CM | POA: Diagnosis not present

## 2020-03-20 DIAGNOSIS — Z7982 Long term (current) use of aspirin: Secondary | ICD-10-CM | POA: Diagnosis not present

## 2020-03-20 DIAGNOSIS — Z86018 Personal history of other benign neoplasm: Secondary | ICD-10-CM | POA: Diagnosis not present

## 2020-03-20 DIAGNOSIS — I480 Paroxysmal atrial fibrillation: Secondary | ICD-10-CM | POA: Diagnosis not present

## 2020-03-20 DIAGNOSIS — Z85038 Personal history of other malignant neoplasm of large intestine: Secondary | ICD-10-CM | POA: Diagnosis not present

## 2020-03-20 DIAGNOSIS — I10 Essential (primary) hypertension: Secondary | ICD-10-CM | POA: Diagnosis not present

## 2020-03-20 DIAGNOSIS — Z8673 Personal history of transient ischemic attack (TIA), and cerebral infarction without residual deficits: Secondary | ICD-10-CM | POA: Diagnosis not present

## 2020-03-20 DIAGNOSIS — S82032D Displaced transverse fracture of left patella, subsequent encounter for closed fracture with routine healing: Secondary | ICD-10-CM | POA: Diagnosis not present

## 2020-03-20 DIAGNOSIS — Z8679 Personal history of other diseases of the circulatory system: Secondary | ICD-10-CM | POA: Diagnosis not present

## 2020-03-20 DIAGNOSIS — M109 Gout, unspecified: Secondary | ICD-10-CM | POA: Diagnosis not present

## 2020-03-20 DIAGNOSIS — Z7901 Long term (current) use of anticoagulants: Secondary | ICD-10-CM | POA: Diagnosis not present

## 2020-03-20 DIAGNOSIS — E785 Hyperlipidemia, unspecified: Secondary | ICD-10-CM | POA: Diagnosis not present

## 2020-03-20 DIAGNOSIS — S82042D Displaced comminuted fracture of left patella, subsequent encounter for closed fracture with routine healing: Secondary | ICD-10-CM | POA: Diagnosis not present

## 2020-03-20 DIAGNOSIS — Z87311 Personal history of (healed) other pathological fracture: Secondary | ICD-10-CM | POA: Diagnosis not present

## 2020-03-20 DIAGNOSIS — M19042 Primary osteoarthritis, left hand: Secondary | ICD-10-CM | POA: Diagnosis not present

## 2020-03-21 ENCOUNTER — Other Ambulatory Visit: Payer: Self-pay

## 2020-03-21 NOTE — Patient Outreach (Signed)
  [facility]) Care Management Chronic Special Needs Program  03/21/2020  Name: Curtis Dunn DOB: Mar 29, 1941  MRN: [id number]  [name] is enrolled in a chronic special needs plan for Diabetes.  Client admitted as observation on 03/16/20 for patellectomy then transitioned to inpatient on 03/17/20  discharging on 03/18/20.   Reviewed and updated care plan.   Goals Addressed            This Visit's Progress   . General - Client will not be readmitted within 30 days (C-SNP)       Client admitted to hospital 03/17/20 discharged on 03/18/20. Please follow discharge instructions and call provider if you have any questions. Please attend all follow up appointments as scheduled. Please take your medications as prescribed. Please call 24 Hour nurse advice line as needed [phone]).        Plan:  Individualized care plan sent to client and primary care provider.  RNCM will follow up with client within 1 months.    Quinn Plowman RN,BSN,CCM Chronic Care Management Coordinator [facility] [phone]    .

## 2020-03-28 LAB — GLUCOSE, CAPILLARY
Glucose-Capillary: 37 mg/dL — CL (ref 70–99)
Glucose-Capillary: 38 mg/dL — CL (ref 70–99)

## 2020-03-28 NOTE — Discharge Summary (Signed)
Physician Discharge Summary  Patient ID: Curtis Dunn MRN: [id number] DOB/AGE: [birth year] 79 y.o.  Admit date: 03/16/2020 Discharge date: 03/28/2020  Admission Diagnoses: Left displaced comminuted fracture of patella  Discharge Diagnoses:  Active Problems:   Displaced comminuted fracture of left patella, initial encounter for closed fracture   Discharged Condition: stable  Hospital Course: patient was admitted on April 30 for a left  [name] due to a displaced comminuted left patella fracture.  Patient did well during surgery, no complications arose.  Patient was sent to PACU in stable condition.  He was sent up to the postop floor in stable condition.  Patient was able to get up with physical apy this first night, was able to ambulate safely with a rolling walker with minimal assistance..  No acute events overnight. Postop day 1 patient was .  He was able to get up and work with physical therapy 2 times this day.  Continue to increase his distance as well as continuing to increase ambulation.patient doing well.  No acute events overnight.  Postop day 2patient worked with physical therapy one more time in the morning and was able to be discharged late morning or early afternoon.  Patient was disched home with pain medication, muscle relaxer and nausea medication.  Consults: None  Significant Diagnostic Studies: none  Treatments: antibiotics: Ancef, analgesia: acetaminophen and oxycodone, anticoagulation: Eliquis, therapies: PT and surgery: left pattelectomy  Discharge Exam: Blood pressure (!) 142/73, pulse (!) 58, temperature 98.1 F (36.7 C), resp. rate 20, height 5\' 11"  (1.803 m), weight 68.9 kg, SpO2 95 %. General appearance: alert, cooperative, appears stated age and no distress Extremities: extremities normal, atraumatic, no cyanosis or edema and Homans sign is negative, no sign of DVT Pulses: 2+ and symmetric Skin: Skin color, texture, turgor normal. No rashes or  lesions Neurologic: Grossly normal Incision/Wound: dressings clean dry and intact  Disposition: Discharge disposition: 01-Home or Self Care       Discharge Instructions    Call MD / Call 911   Complete by: As directed    If you experience chest pain or shortness of breath, CALL 911 and be transported to the hospital emergency room.  If you develope a fever above 101 F, pus (white drainage) or increased drainage or redness at the wound, or calf pain, call your surgeon's office.   Constipation Prevention   Complete by: As directed    Drink plenty of fluids.  Prune juice may be helpful.  You may use a stool softener, such as Colace (over the counter) 100 mg twice a day.  Use MiraLax (over the counter) for constipation as needed.   Diet - low sodium heart healthy   Complete by: As directed    Discharge instructions   Complete by: As directed    Please resume home Medications Please leave dressings on dry and intact until follow up appointment in 3 weeks Toe touch weight bearing in splints Okay to move toes and ankles  Ice and elevate extremity Okay to take 1000 mg of Tylenol up to 3 times a day, okay to take 10 mg of oxycodone if needed q 4-6 hours in the beginning to help with pain   Driving restrictions   Complete by: As directed    No driving until cleared   Increase activity slowly as tolerated   Complete by: As directed      Allergies as of 03/18/2020   No Known Allergies     Medication List    TAKE  these medications   allopurinol 100 MG tablet Commonly known as: ZYLOPRIM Take 100 mg by mouth daily with breakfast.   amLODipine 5 MG tablet Commonly known as: NORVASC Take 1 tablet by mouth once daily What changed: when to take this   apixaban 5 MG Tabs tablet Commonly known as: Eliquis Take 1 tablet (5 mg total) by mouth 2 (two) times daily.   aspirin 81 MG EC tablet Take 1 tablet (81 mg total) by mouth daily. What changed: when to take this   COQ10 PO Take 1  capsule by mouth daily with breakfast.   DHEA PO Take 100 mg by mouth daily with breakfast.   FreeStyle Libre 14 Day Sensor Misc USE TO MONITOR BLOOD GLUCOSE. CHANGE EVERY 14 DAYS.   furosemide 40 MG tablet Commonly known as: LASIX Take 20 mg by mouth 2 (two) times daily.   Hawthorn 565 MG Caps Take 565 mg by mouth daily with breakfast.   hydrALAZINE 50 MG tablet Commonly known as: APRESOLINE Take 1 tablet by mouth twice daily What changed:   when to take this  additional instructions   insulin aspart 100 UNIT/ML injection Commonly known as: novoLOG Inject 10 Units into the skin 3 (three) times daily before meals.   insulin glargine 100 UNIT/ML injection Commonly known as: LANTUS Inject 0.12 mLs (12 Units total) into the skin 2 (two) times daily. Inject 12 units at breakfast and 10 units at bedtime What changed:   when to take this  additional instructions   lovastatin 20 MG tablet Commonly known as: MEVACOR Take 20 mg by mouth daily with breakfast.   methocarbamol 500 MG tablet Commonly known as: Robaxin Take 1 tablet (500 mg total) by mouth 4 (four) times daily.   metoprolol succinate 25 MG 24 hr tablet Commonly known as: TOPROL-XL Take 1 tablet by mouth once daily What changed: when to take this   ondansetron 4 MG tablet Commonly known as: Zofran Take 1 tablet (4 mg total) by mouth daily as needed for nausea or vomiting.   Potassium 99 MG Tabs Take 99 mg by mouth daily with breakfast.   ramipril 10 MG capsule Commonly known as: ALTACE Take 1 capsule by mouth twice daily What changed:   when to take this  additional instructions   Saw Palmetto 450 MG Caps Take 900 mg by mouth daily with breakfast.   TRIPLE OMEGA-3-6-9 PO Take 1 capsule by mouth daily with breakfast.   Vitamin B-12 5000 MCG Subl Take 5,000 mcg by mouth daily with breakfast.   vitamin C 1000 MG tablet Take 1,000 mg by mouth every other day.   Vitamin D3 250 MCG (10000 UT)  capsule Take 10,000 Units by mouth daily with breakfast.   vitamin E 1000 UNIT capsule Take 1,000 Units by mouth every evening.   Zinc 50 MG Caps Take 50 mg by mouth daily with breakfast.     ASK your doctor about these medications   oxyCODONE 5 MG immediate release tablet Commonly known as: Roxicodone Take 1 tablet (5 mg total) by mouth every 4 (four) hours as needed for up to 7 days. Ask about: Should I take this medication?      Follow-up Information    Sydnee Cabal, MD. Schedule an appointment as soon as possible for a visit in 2 weeks.   Specialty: Orthopedic Surgery Why: For wound re-check Contact information: [address] [place] [address] [phone]        [facility]  Follow up.   Specialty: Home Health Services Why: agency will provide home health physical and occupational therapy. Contact information: [address] [place] [address] [phone]           Signed: Nettie Elm [facility] 03/28/2020, 8:18 AM

## 2020-03-29 DIAGNOSIS — Z4789 Encounter for other orthopedic aftercare: Secondary | ICD-10-CM | POA: Diagnosis not present

## 2020-03-29 DIAGNOSIS — M25562 Pain in left knee: Secondary | ICD-10-CM | POA: Diagnosis not present

## 2020-04-03 DIAGNOSIS — Z85038 Personal history of other malignant neoplasm of large intestine: Secondary | ICD-10-CM | POA: Diagnosis not present

## 2020-04-03 DIAGNOSIS — Z8673 Personal history of transient ischemic attack (TIA), and cerebral infarction without residual deficits: Secondary | ICD-10-CM | POA: Diagnosis not present

## 2020-04-03 DIAGNOSIS — I10 Essential (primary) hypertension: Secondary | ICD-10-CM | POA: Diagnosis not present

## 2020-04-03 DIAGNOSIS — I480 Paroxysmal atrial fibrillation: Secondary | ICD-10-CM | POA: Diagnosis not present

## 2020-04-03 DIAGNOSIS — Z7982 Long term (current) use of aspirin: Secondary | ICD-10-CM | POA: Diagnosis not present

## 2020-04-03 DIAGNOSIS — Z8679 Personal history of other diseases of the circulatory system: Secondary | ICD-10-CM | POA: Diagnosis not present

## 2020-04-03 DIAGNOSIS — Z7901 Long term (current) use of anticoagulants: Secondary | ICD-10-CM | POA: Diagnosis not present

## 2020-04-03 DIAGNOSIS — S82042D Displaced comminuted fracture of left patella, subsequent encounter for closed fracture with routine healing: Secondary | ICD-10-CM | POA: Diagnosis not present

## 2020-04-03 DIAGNOSIS — E109 Type 1 diabetes mellitus without complications: Secondary | ICD-10-CM | POA: Diagnosis not present

## 2020-04-03 DIAGNOSIS — M19042 Primary osteoarthritis, left hand: Secondary | ICD-10-CM | POA: Diagnosis not present

## 2020-04-03 DIAGNOSIS — Z86018 Personal history of other benign neoplasm: Secondary | ICD-10-CM | POA: Diagnosis not present

## 2020-04-03 DIAGNOSIS — E785 Hyperlipidemia, unspecified: Secondary | ICD-10-CM | POA: Diagnosis not present

## 2020-04-03 DIAGNOSIS — S82032D Displaced transverse fracture of left patella, subsequent encounter for closed fracture with routine healing: Secondary | ICD-10-CM | POA: Diagnosis not present

## 2020-04-03 DIAGNOSIS — Z87311 Personal history of (healed) other pathological fracture: Secondary | ICD-10-CM | POA: Diagnosis not present

## 2020-04-03 DIAGNOSIS — M109 Gout, unspecified: Secondary | ICD-10-CM | POA: Diagnosis not present

## 2020-04-04 ENCOUNTER — Ambulatory Visit: Payer: HMO

## 2020-04-04 DIAGNOSIS — Z4789 Encounter for other orthopedic aftercare: Secondary | ICD-10-CM | POA: Diagnosis not present

## 2020-04-04 DIAGNOSIS — S82042D Displaced comminuted fracture of left patella, subsequent encounter for closed fracture with routine healing: Secondary | ICD-10-CM | POA: Diagnosis not present

## 2020-04-09 ENCOUNTER — Telehealth: Payer: Self-pay | Admitting: Adult Health

## 2020-04-09 NOTE — Telephone Encounter (Signed)
Pt called wanting to discuss his last office visit with RN or provider. Please advise.

## 2020-04-10 NOTE — Telephone Encounter (Signed)
I called the pt and discussed the message below from [place] NP. The pt verbalized understanding. He said his question about the Xarelto was in regards to the change from 20 mg to 15 mg. I advised he would need to discuss with cardiology. He declined. He has declined a video visit and is happy to follow-up only as needed. 6/10 appt with Janett Billow canceled. He was very appreciative for the call.

## 2020-04-10 NOTE — Telephone Encounter (Signed)
I called the pt back. He said he recently had L leg surgery after an injury and is casted from ankle to hip. He cannot drive. He said he cannot come to his follow-up on June 10th and wished for it to be pushed back to August. The pt also mentioned that he did not know we had access to the hospital records from December or else he would have had questions to discuss at his last office visit. He said he didn't know why he had changes made to his Xarelto and wondered if this caused the stroke. I advised this would need to be discussed at a visit. He said he didn't want to discuss it. To save the patient from waiting until August I tried to offer a video visit instead of coming in but the patient declined and even said he didn't know why the follow-up was needed. He also said that waiting until August was a long time after the stroke and he wasn't even sure when he would be able to drive again. I again tried to offer a video visit or possibly even telephone visit so he did not have to leave to his house and I explained why we have stroke follow-ups here at the office. I tried to discuss this with the pt but he made it clear that he did not have anymore questions, stroke-wise he said he never had any symptoms. I let him know I would send the question to [facility] NP and call him back. He verbalized appreciation.

## 2020-04-10 NOTE — Telephone Encounter (Signed)
Patient was seen patient was seen in February for hospital follow-up regarding his stroke in December.  It was discussed that Xarelto was discontinued and switched to Eliquis as he had a stroke despite Xarelto usage and underlying history of atrial fibrillation.  He was doing well at prior visit and per chart review, he follows with cardiology and PCP routinely.  If he has concerns regarding transportation to visit and declines virtual visit, he may follow-up as needed.

## 2020-04-26 ENCOUNTER — Ambulatory Visit: Payer: HMO | Admitting: Adult Health

## 2020-05-08 DIAGNOSIS — Z7982 Long term (current) use of aspirin: Secondary | ICD-10-CM | POA: Diagnosis not present

## 2020-05-08 DIAGNOSIS — Z86018 Personal history of other benign neoplasm: Secondary | ICD-10-CM | POA: Diagnosis not present

## 2020-05-08 DIAGNOSIS — S82042D Displaced comminuted fracture of left patella, subsequent encounter for closed fracture with routine healing: Secondary | ICD-10-CM | POA: Diagnosis not present

## 2020-05-08 DIAGNOSIS — M19042 Primary osteoarthritis, left hand: Secondary | ICD-10-CM | POA: Diagnosis not present

## 2020-05-08 DIAGNOSIS — I480 Paroxysmal atrial fibrillation: Secondary | ICD-10-CM | POA: Diagnosis not present

## 2020-05-08 DIAGNOSIS — Z87311 Personal history of (healed) other pathological fracture: Secondary | ICD-10-CM | POA: Diagnosis not present

## 2020-05-08 DIAGNOSIS — Z8673 Personal history of transient ischemic attack (TIA), and cerebral infarction without residual deficits: Secondary | ICD-10-CM | POA: Diagnosis not present

## 2020-05-08 DIAGNOSIS — S82032D Displaced transverse fracture of left patella, subsequent encounter for closed fracture with routine healing: Secondary | ICD-10-CM | POA: Diagnosis not present

## 2020-05-08 DIAGNOSIS — M109 Gout, unspecified: Secondary | ICD-10-CM | POA: Diagnosis not present

## 2020-05-08 DIAGNOSIS — I10 Essential (primary) hypertension: Secondary | ICD-10-CM | POA: Diagnosis not present

## 2020-05-08 DIAGNOSIS — E109 Type 1 diabetes mellitus without complications: Secondary | ICD-10-CM | POA: Diagnosis not present

## 2020-05-08 DIAGNOSIS — Z7901 Long term (current) use of anticoagulants: Secondary | ICD-10-CM | POA: Diagnosis not present

## 2020-05-08 DIAGNOSIS — E785 Hyperlipidemia, unspecified: Secondary | ICD-10-CM | POA: Diagnosis not present

## 2020-05-08 DIAGNOSIS — Z85038 Personal history of other malignant neoplasm of large intestine: Secondary | ICD-10-CM | POA: Diagnosis not present

## 2020-05-08 DIAGNOSIS — Z8679 Personal history of other diseases of the circulatory system: Secondary | ICD-10-CM | POA: Diagnosis not present

## 2020-05-09 ENCOUNTER — Other Ambulatory Visit: Payer: Self-pay

## 2020-05-09 NOTE — Patient Outreach (Signed)
  [facility]) Care Management Chronic Special Needs Program  05/09/2020  Name: Curtis Dunn DOB: 03/06/1941  MRN: [id number]  Curtis Dunn is enrolled in a chronic special needs plan.   Telephone call to client regarding 24 hour nurse advise line referral. HIPAA verified. Client states he would like to know if there is medical testing for inflammation for future knowledge.  Client denies any current inflammation concerns or pain. He reports having a [name] in April 2021 due to sustaining a fractured patella from a fall. Client reports he is recovering well and denies any associated pain at surgery site. He reports using a crutch for ambulation and a brace on his left knee.  Client reports he is receiving physical therapy services with [facility]. Client states he has to ask friend to help him with transportation to his doctor appointments.     Goals Addressed              This Visit's Progress   .  "I would like to know if there is testing that can be done for imflammation" (pt-stated)   On track     Contact your doctor to discuss concerns regarding inflammation and appropriate diagnostic testing.     .  Client will report no fall or injuries in the next 1-2 months.   On track     Client reports sustaining a fractured left patella due to a fall that occurred in April 2021.  Client had surgical procedure, [name] on 03/16/20 Continue to work with [facility] for physical therapy services  Continue to use ambulatory device (crutch) as advised by your doctor.  Inside your home make sure there is plenty of lighting, keep walkways clear of clutter, and remove tripping hazards such as cords or throw rugs.  Be aware of small pets when walking.  Continue to wear Knee brace as advised by your doctor. Contact your doctor if you have questions or concerns.        .  Client will report securing transportation for upcoming doctors appointments  within 3 months.   On track     RN case manager discussed and offered social work services for transportation assistance. Client declined at this time.  Contact your RN case manager if social work services are needed in the future.     .  Client/Caregiver will verbalize understanding of instructions related to self-care and safety   On track     Use crutch/ crutches as advised by your provider Make sure there is plenty of lighting, keep walkways clear of clutter and remove tripping hazards such as cords or throw rugs. Be aware of small pets when walking  Continue to wear your knee brace as advised by your doctor Call your doctor if you have questions.     .  COMPLETED: General - Client will not be readmitted within 30 days (C-SNP)        Client denies any hospital readmissions       Plan:  RNCM will send updated individualized care plan to client and primary care provider.   Chronic care management coordinator will outreach within 3 months.     Quinn Plowman RN,BSN,CCM Chronic Care Management Coordinator [facility] [phone]    .

## 2020-05-16 DIAGNOSIS — E039 Hypothyroidism, unspecified: Secondary | ICD-10-CM | POA: Diagnosis not present

## 2020-05-16 DIAGNOSIS — N1831 Chronic kidney disease, stage 3a: Secondary | ICD-10-CM | POA: Diagnosis not present

## 2020-05-16 DIAGNOSIS — M109 Gout, unspecified: Secondary | ICD-10-CM | POA: Diagnosis not present

## 2020-05-16 DIAGNOSIS — Z125 Encounter for screening for malignant neoplasm of prostate: Secondary | ICD-10-CM | POA: Diagnosis not present

## 2020-05-16 DIAGNOSIS — E7849 Other hyperlipidemia: Secondary | ICD-10-CM | POA: Diagnosis not present

## 2020-05-16 DIAGNOSIS — E1159 Type 2 diabetes mellitus with other circulatory complications: Secondary | ICD-10-CM | POA: Diagnosis not present

## 2020-05-23 DIAGNOSIS — I4510 Unspecified right bundle-branch block: Secondary | ICD-10-CM | POA: Diagnosis not present

## 2020-05-23 DIAGNOSIS — Z1212 Encounter for screening for malignant neoplasm of rectum: Secondary | ICD-10-CM | POA: Diagnosis not present

## 2020-05-23 DIAGNOSIS — M109 Gout, unspecified: Secondary | ICD-10-CM | POA: Diagnosis not present

## 2020-05-23 DIAGNOSIS — I129 Hypertensive chronic kidney disease with stage 1 through stage 4 chronic kidney disease, or unspecified chronic kidney disease: Secondary | ICD-10-CM | POA: Diagnosis not present

## 2020-05-23 DIAGNOSIS — I6340 Cerebral infarction due to embolism of unspecified cerebral artery: Secondary | ICD-10-CM | POA: Diagnosis not present

## 2020-05-23 DIAGNOSIS — H6060 Unspecified chronic otitis externa, unspecified ear: Secondary | ICD-10-CM | POA: Diagnosis not present

## 2020-05-23 DIAGNOSIS — I700 Atherosclerosis of aorta: Secondary | ICD-10-CM | POA: Diagnosis not present

## 2020-05-23 DIAGNOSIS — N1832 Chronic kidney disease, stage 3b: Secondary | ICD-10-CM | POA: Diagnosis not present

## 2020-05-23 DIAGNOSIS — Z0000 Encounter for general adult medical examination without abnormal findings: Secondary | ICD-10-CM | POA: Diagnosis not present

## 2020-05-23 DIAGNOSIS — I779 Disorder of arteries and arterioles, unspecified: Secondary | ICD-10-CM | POA: Diagnosis not present

## 2020-05-23 DIAGNOSIS — E1029 Type 1 diabetes mellitus with other diabetic kidney complication: Secondary | ICD-10-CM | POA: Diagnosis not present

## 2020-05-23 DIAGNOSIS — K8020 Calculus of gallbladder without cholecystitis without obstruction: Secondary | ICD-10-CM | POA: Diagnosis not present

## 2020-05-23 DIAGNOSIS — D500 Iron deficiency anemia secondary to blood loss (chronic): Secondary | ICD-10-CM | POA: Diagnosis not present

## 2020-05-23 DIAGNOSIS — D126 Benign neoplasm of colon, unspecified: Secondary | ICD-10-CM | POA: Diagnosis not present

## 2020-06-01 ENCOUNTER — Ambulatory Visit: Payer: Self-pay

## 2020-06-04 ENCOUNTER — Other Ambulatory Visit: Payer: Self-pay

## 2020-06-04 NOTE — Patient Outreach (Signed)
  [facility]) Care Management Chronic Special Needs Program    06/04/2020  Name: Curtis Dunn, DOB: 1941-08-05  MRN: [id number]   [name] is enrolled in a chronic special needs plan for Diabetes.  Telephone call to client for CSNP assessment follow up. HIPAA verified.  Client states he is not able to talk to day and would rather have a call back next week.    PLAN RNCM will attempt 2nd call to client in 2 weeks.   Quinn Plowman RN,BSN,CCM [facility] [phone]

## 2020-06-06 DIAGNOSIS — Z4789 Encounter for other orthopedic aftercare: Secondary | ICD-10-CM | POA: Diagnosis not present

## 2020-06-06 DIAGNOSIS — S82012D Displaced osteochondral fracture of left patella, subsequent encounter for closed fracture with routine healing: Secondary | ICD-10-CM | POA: Diagnosis not present

## 2020-06-12 DIAGNOSIS — M25562 Pain in left knee: Secondary | ICD-10-CM | POA: Diagnosis not present

## 2020-06-15 ENCOUNTER — Other Ambulatory Visit: Payer: Self-pay

## 2020-06-18 NOTE — Patient Outreach (Signed)
[facility]) Care Management Chronic Special Needs Program  06/18/2020  Late entry for 06/15/20  Name: NENG ALBEE DOB: [birth year]  MRN: [id number]  Mr. Saafir Abdullah is enrolled in a chronic special needs plan for Diabetes. Telephone call to client for CSNP assessment follow up. HIPAA verified.  Client reports he is doing well. He reports he was released from the surgeon standpoint on 06/06/20.  He reports he continues to go to outpatient physical therapy for rehab.  Client states he is doing well with his therapy and feels he continues to make progress. Client states he continues to follow up closely with his doctor regarding his diabetes. He reports occasional  Low blood sugars below 70.  He reports his doctor is aware. Client states his most recent A1c is 6.3.   Goals Addressed              This Visit's Progress   .  COMPLETED: "I would like to know if there is testing that can be done for imflammation" (pt-stated)        Client reports he discussed  with his doctor.     [name]  'I'm concerned about the new swelling in my feet since my gout resolved" (pt-stated)   On track     Client reports having follow up with doctor and discussing concerns.  Continue to elevate your feet periodically throughout the day when sitting.  Contact your doctor if you have ongoing questions or concerns.  Continue to take your medications as prescribed.      .  Advanced Care Planning complete by December 2021   On track     Contact your RN case manager if you need assistance regarding your Advanced Directive.     .  COMPLETED: Client understands the importance of follow-up with providers by attending scheduled visits   On track     Most recent primary care provider 05/23/20  endocrinology visit 01/20/20 Continue to maintain and keep follow up visits with your providers.       .  COMPLETED: Client will report follow up with doctor regarding hypoglycemic event within 1 month  (pt-stated)        Client reports his has discussed his blood sugars with his provider. He reports having a follow up with his provider on 05/23/20.     [name]  COMPLETED: Client will report no fall or injuries in the next 1-2 months.        Client denies any falls within the past 1-2 months.        .  COMPLETED: Client will report securing transportation for upcoming doctors appointments within 3 months.        Client reports he has been cleared to drive.     .  Client will verbalize knowledge of self management of Hypertension as evidences by BP reading of 130/80 or less; or as defined by provider   On track     Continue to monitor your blood pressure as advised by your doctor.  Continue to take your blood pressure medication as prescribed.  Contact your doctor if you have questions or concerns.  RN case manager will send client education article: Checking your blood pressure at home.     .  Client/Caregiver will verbalize understanding of instructions related to self-care and safety   On track     Client states he has been advised by his doctor to use a cane instead of crutches.  Client reports his home  health therapy has ended and he is now doing outpatient therapy.  RN case manager will send client education article: Preventing falls.      [name]  HEMOGLOBIN A1C < 7        Hgb A1C is consistently below 7 Hgb A1C was 6.3 on 05/16/20 Education information sent quarterly from [employer] to client. Please review.      .  Maintain timely refills of diabetic medication as prescribed within the year .   On track     Client reports maintaining timely refills of his diabetes medication Medication review completed with client  Contact your RN case manager if you have difficulty obtaining your medications.  Contact your doctor if you have questions.       .  COMPLETED: Obtain annual  Lipid Profile, LDL-C- LDL goal is <70   On track     Lipid panel was drawn on 05/16/20 T    .  Obtain  Annual Eye (retinal)  Exam    On track     Client had retinal eye exam on 07/14/19  Client reports next follow up eye exam is scheduled for August 2021      .  COMPLETED: Obtain Annual Foot Exam        Diabetic foot exam was completed on 05/23/20       .  COMPLETED: Obtain annual screen for micro albuminuria (urine) , nephropathy (kidney problems)   On track     Most recent urine for microalbumin was completed  05/23/20 Attend ye    .  COMPLETED: Obtain Hemoglobin A1C at least 2 times per year   On track     Most recent Hgb A1C was checked 01/20/20 and 05/16/20     .  Visit Primary Care Provider or Endocrinologist at least 2 times per year    On track     Most recent primary care provider 05/23/20  endocrinology visit 01/20/20 Continue to maintain and keep follow up visits with your providers.        Assessment: Client is  meeting diabetes self-management goal of hemoglobin A1C of <7.0% with most recent reading of 6.3% on 05/16/20.   Plan:  Send successful outreach letter with a copy of their individualized care plan, Send individual care plan to provider and Send educational material  Chronic care management coordinator will outreach in:  6 Months  Quinn Plowman RN,BSN,CCM [facility] [phone]     .

## 2020-06-20 DIAGNOSIS — M25562 Pain in left knee: Secondary | ICD-10-CM | POA: Diagnosis not present

## 2020-06-22 DIAGNOSIS — M25562 Pain in left knee: Secondary | ICD-10-CM | POA: Diagnosis not present

## 2020-06-25 NOTE — Progress Notes (Signed)
CARDIOLOGY OFFICE NOTE  Date:  07/02/2020    Curtis Dunn Date of Birth: 29-May-1941 Medical Record #[id number]  PCP:  Reynold Bowen, MD  Cardiologist:  Marisa Cyphers  Chief Complaint  Patient presents with  . Follow-up    Seen for Dr. Marlou Porch    History of Present Illness: Curtis Dunn is a 79 y.o. male who presents today for a 7 month check. Seen for Dr. Marlou Porch.   He has a history of PAF noted during nuclear stress testing in 2006 - no ischemia. Former Emergency planning/management officer. Had large right MCA infarct and left PCA subacute infarct in December of 2020 with mild hemorrhage. Xarelto was held and then he was switched to Eliquis. On statin and aspirin.   Last seen in January by Dr. Marlou Porch and felt to be doing ok. Various orthopedic issues noted.   Comes in today. Here alone. Notes that he had knee surgery by Dr. Theda Sers back in April - that seems to have gone well. He is more upset about how we handled his clearance - apparently there was some miscommunication about his Eliquis and the routing of the note from [place]. He is quite focused on this. He says he has recovered from his stroke - does not understand how that stroke occurred. He has had slow recovery with his knee surgery. He has had to depend more on his neighbors and other family that he does not wish to be doing. Apparently his licence was taken away - the DMV called him - he had to take a driving test. He does not understand why "PAF" is on his record. He has been vaccinated for COVID - did the [employer].   Past Medical History:  Diagnosis Date  . Arthritis    left thumb  . Broken neck ([place])    in March 2015  . Cataract   . Colon cancer ([place])   . Colon polyp   . Diabetes mellitus    Type 1  since 1974  . Dysrhythmia    a fib  . Gout   . Hyperlipidemia   . Hypertension   . Lipoma of back   . Paroxysmal atrial fibrillation (HCC)   . Stroke [facility])    11/14/19  no defecit     Past Surgical History:    Procedure Laterality Date  . ANKLE SURGERY     right  . APPENDECTOMY  2001  . arm surgery     right shoulder surgery  rmoval of tendon  . CATARACT EXTRACTION EXTRACAPSULAR Bilateral   . COLON RESECTION    . ELBOW FRACTURE SURGERY    . HERNIA REPAIR     umbilica and left inguinal  . INGUINAL HERNIA REPAIR Left 03/13/2016   Procedure: LEFT INGUINAL HERNIA REPAIR WITH UNBILICAL HERNIA REPAIR;  Surgeon: [name], MD;  Location: [place];  Service: General;  Laterality: Left;  . INSERTION OF MESH Left 03/13/2016   Procedure: INSERTION OF MESH;  Surgeon: [name], MD;  Location: [place];  Service: General;  Laterality: Left;  [name] MASS EXCISION N/A 07/20/2019   Procedure: EXCISION OF BACK MASS;  Surgeon: Rolm Bookbinder, MD;  Location: [place];  Service: General;  Laterality: N/A;  MAC AND LOCAL  . ORIF PATELLA Left 03/16/2020   Procedure: [name];  Surgeon: Sydnee Cabal, MD;  Location: [facility];  Service: Orthopedics;  Laterality: Left;  femoral nerve block vs general  . VASECTOMY  Medications: Current Meds  Medication Sig  . allopurinol (ZYLOPRIM) 100 MG tablet Take 100 mg by mouth daily with breakfast.   . amLODipine (NORVASC) 5 MG tablet Take 1 tablet by mouth once daily  . apixaban (ELIQUIS) 5 MG TABS tablet Take 1 tablet (5 mg total) by mouth 2 (two) times daily.  . Ascorbic Acid (VITAMIN C) 1000 MG tablet Take 1,000 mg by mouth every other day.   [name] aspirin EC 81 MG EC tablet Take 1 tablet (81 mg total) by mouth daily.  . Cholecalciferol (VITAMIN D3) 250 MCG (10000 UT) capsule Take 10,000 Units by mouth daily with breakfast.   . Coenzyme Q10 (COQ10 PO) Take 1 capsule by mouth daily with breakfast.  . Continuous Blood Gluc Sensor (FREESTYLE LIBRE 14 DAY SENSOR) MISC USE TO MONITOR BLOOD GLUCOSE. CHANGE EVERY 14 DAYS.  [name] Cyanocobalamin (VITAMIN B-12) 5000 MCG SUBL Take 5,000 mcg by mouth daily with breakfast.  . furosemide (LASIX) 40 MG tablet Take 20  mg by mouth 2 (two) times daily.   [name] Hawthorn 565 MG CAPS Take 565 mg by mouth daily with breakfast.  . hydrALAZINE (APRESOLINE) 50 MG tablet Take 1 tablet by mouth twice daily (Patient taking differently: Take 50 mg by mouth in the morning and at bedtime. Breakfast & supper)  . insulin aspart (NOVOLOG) 100 UNIT/ML injection Inject 10 Units into the skin 3 (three) times daily before meals.   . insulin glargine (LANTUS) 100 UNIT/ML injection Inject 0.12 mLs (12 Units total) into the skin 2 (two) times daily. Inject 12 units at breakfast and 10 units at bedtime (Patient taking differently: Inject 12 Units into the skin See admin instructions. Inject 14 units at breakfast and 12 units at bedtime)  . lovastatin (MEVACOR) 20 MG tablet Take 20 mg by mouth daily with breakfast.   . metoprolol succinate (TOPROL-XL) 25 MG 24 hr tablet Take 1 tablet by mouth once daily  . Nutritional Supplements (DHEA PO) Take 100 mg by mouth daily with breakfast.   . Omega 3-6-9 Fatty Acids (TRIPLE OMEGA-3-6-9 PO) Take 1 capsule by mouth daily with breakfast.  . Potassium 99 MG TABS Take 99 mg by mouth daily with breakfast.   . ramipril (ALTACE) 10 MG capsule Take 1 capsule by mouth twice daily (Patient taking differently: Take 10 mg by mouth in the morning and at bedtime. Breakfast & supper)  . Saw Palmetto 450 MG CAPS Take 900 mg by mouth daily with breakfast.   . tamsulosin (FLOMAX) 0.4 MG CAPS capsule Take 0.4 mg by mouth. Client states he takes it as needed.  . vitamin E 1000 UNIT capsule Take 1,000 Units by mouth every evening.   . Zinc 50 MG CAPS Take 50 mg by mouth daily with breakfast.      Allergies: No Known Allergies  Social History: The patient  reports that he has never smoked. He has never used smokeless tobacco. He reports previous alcohol use. He reports that he does not use drugs.   Family History: The patient's family history includes Heart disease in his father.   Review of Systems: Please see  the history of present illness.   All other systems are reviewed and negative.   Physical Exam: VS:  BP 120/60   Pulse 82   Ht _0  (1.803 m)   Wt 147 lb (66.7 kg)   SpO2 98%   BMI 20.50 kg/m  .  BMI Body mass index is 20.5 kg/m.  Wt Readings from Last  3 Encounters:  07/02/20 147 lb (66.7 kg)  03/16/20 151 lb 14.4 oz (68.9 kg)  03/14/20 152 lb (68.9 kg)    General: Alert and in no acute distress.  Remains very tangential.  Cardiac: Regular rate and rhythm. No murmurs, rubs, or gallops. No edema.  Respiratory:  Lungs are clear to auscultation bilaterally with normal work of breathing.  GI: Soft and nontender.  MS: No deformity or atrophy. Gait and ROM intact. Left knee with some mild swelling noted.  Skin: Warm and dry. Color is normal.  Neuro:  Strength and sensation are intact and no gross focal deficits noted.  Psych: Alert, appropriate and with normal affect.   LABORATORY DATA:  EKG:  EKG is not ordered today.   Lab Results  Component Value Date   WBC 8.9 03/14/2020   HGB 11.4 (L) 03/14/2020   HCT 35.7 (L) 03/14/2020   PLT 334 03/14/2020   GLUCOSE 68 (L) 03/14/2020   CHOL 126 11/15/2019   TRIG 62 11/15/2019   HDL 46 11/15/2019   LDLCALC 68 11/15/2019   ALT 20 11/14/2019   AST 30 11/14/2019   NA 141 03/14/2020   K 5.0 03/14/2020   CL 106 03/14/2020   CREATININE 1.58 (H) 03/14/2020   BUN 39 (H) 03/14/2020   CO2 28 03/14/2020   INR 1.0 11/14/2019   HGBA1C 5.8 (H) 03/14/2020       BNP (last 3 results) No results for input(s): BNP in the last 8760 hours.  ProBNP (last 3 results) No results for input(s): PROBNP in the last 8760 hours.   Other Studies Reviewed Today:  ECHO IMPRESSIONS 10/2019  1. Left ventricular ejection fraction, by visual estimation, is 60 to  65%. The left ventricle has normal function. There is no left ventricular  hypertrophy.  2. The left ventricle has no regional wall motion abnormalities.  3. Global right ventricle  has normal systolic function.The right  ventricular size is normal. No increase in right ventricular wall  thickness.  4. Left atrial size was normal.  5. Right atrial size was normal.  6. Trivial pericardial effusion is present.  7. Mild mitral annular calcification.  8. The mitral valve is normal in structure. Trivial mitral valve  regurgitation. No evidence of mitral stenosis.  9. The tricuspid valve is normal in structure.  10. The aortic valve is normal in structure. Aortic valve regurgitation is  trivial. No evidence of aortic valve sclerosis or stenosis.  11. The pulmonic valve was normal in structure. Pulmonic valve  regurgitation is not visualized.  12. TR signal is inadequate for assessing pulmonary artery systolic  pressure.  13. The inferior vena cava is normal in size with greater than 50%  respiratory variability, suggesting right atrial pressure of 3 mmHg.     ASSESSMENT & PLAN:    1. PAF - remains in sinus by exam. Now on anticoagulation following prior stroke.   2. Prior stroke - December of 2020 - seems to have recovered.   3. Chronic anticoagulation - rechecking surveillance lab today.   4. CKD - rechecking lab today.   5. DM - per PCP  6. Gout - not discussed.   7. Remote trauma from hypoglycemic fall with flail chest/ankle fracture - not discussed.   8. HLD - on statin - lab per PCP.   9. Recent knee surgery. Apologized for our delay - this is unclear to me.   Current medicines are reviewed with the patient today.  The patient does not  have concerns regarding medicines other than what has been noted above.  The following changes have been made:  See above.  Labs/ tests ordered today include:    Orders Placed This Encounter  Procedures  . Basic metabolic panel  . CBC     Disposition:   FU with Dr. Marlou Porch as planned.     Patient is agreeable to this plan and will call if any problems develop in the interim.   SignedTruitt Merle, NP  07/02/2020 10:19 AM  [facility] [address] [place], [place]  [address] Phone: [phone] Fax: [phone]

## 2020-06-29 DIAGNOSIS — M25562 Pain in left knee: Secondary | ICD-10-CM | POA: Diagnosis not present

## 2020-07-02 ENCOUNTER — Ambulatory Visit: Payer: HMO | Admitting: Nurse Practitioner

## 2020-07-02 ENCOUNTER — Encounter: Payer: Self-pay | Admitting: Nurse Practitioner

## 2020-07-02 ENCOUNTER — Other Ambulatory Visit: Payer: Self-pay

## 2020-07-02 VITALS — BP 120/60 | HR 82 | Ht 71.0 in | Wt 147.0 lb

## 2020-07-02 DIAGNOSIS — I639 Cerebral infarction, unspecified: Secondary | ICD-10-CM

## 2020-07-02 DIAGNOSIS — Z7901 Long term (current) use of anticoagulants: Secondary | ICD-10-CM

## 2020-07-02 DIAGNOSIS — I10 Essential (primary) hypertension: Secondary | ICD-10-CM | POA: Diagnosis not present

## 2020-07-02 DIAGNOSIS — I480 Paroxysmal atrial fibrillation: Secondary | ICD-10-CM | POA: Diagnosis not present

## 2020-07-02 LAB — BASIC METABOLIC PANEL
BUN/Creatinine Ratio: 24 (ref 10–24)
BUN: 37 mg/dL — ABNORMAL HIGH (ref 8–27)
CO2: 27 mmol/L (ref 20–29)
Calcium: 10 mg/dL (ref 8.6–10.2)
Chloride: 102 mmol/L (ref 96–106)
Creatinine, Ser: 1.56 mg/dL — ABNORMAL HIGH (ref 0.76–1.27)
GFR calc Af Amer: 48 mL/min/{1.73_m2} — ABNORMAL LOW (ref >59)
GFR calc non Af Amer: 42 mL/min/{1.73_m2} — ABNORMAL LOW (ref >59)
Glucose: 141 mg/dL — ABNORMAL HIGH (ref 65–99)
Potassium: 4.6 mmol/L (ref 3.5–5.2)
Sodium: 140 mmol/L (ref 134–144)

## 2020-07-02 LAB — CBC
Hematocrit: 37.5 % (ref 37.5–51.0)
Hemoglobin: 12.3 g/dL — ABNORMAL LOW (ref 13.0–17.7)
MCH: 30.1 pg (ref 26.6–33.0)
MCHC: 32.8 g/dL (ref 31.5–35.7)
MCV: 92 fL (ref 79–97)
Platelets: 266 10*3/uL (ref 150–450)
RBC: 4.09 x10E6/uL — ABNORMAL LOW (ref 4.14–5.80)
RDW: 12.8 % (ref 11.6–15.4)
WBC: 8.3 10*3/uL (ref 3.4–10.8)

## 2020-07-02 NOTE — Patient Instructions (Addendum)
After Visit Summary:  We will be checking the following labs today - BMET & CBC   Medication Instructions:    Continue with your current medicines.    If you need a refill on your cardiac medications before your next appointment, please call your pharmacy.     Testing/Procedures To Be Arranged:  N/A  Follow-Up:   See Dr. Marlou Porch in 6 months -  You will receive a reminder letter in the mail two months in advance. If you don't receive a letter, please call our office to schedule the follow-up appointment.    At [facility], you and your health needs are our priority.  As part of our continuing mission to provide you with exceptional heart care, we have created designated Provider Care Teams.  These Care Teams include your primary Cardiologist (physician) and Advanced Practice Providers (APPs -  Physician Assistants and Nurse Practitioners) who all work together to provide you with the care you need, when you need it.  Special Instructions:  . Stay safe, wash your hands for at least 20 seconds and wear a mask when needed.  . It was good to talk with you today.    Call the [place] office at [phone] if you have any questions, problems or concerns.

## 2020-07-03 ENCOUNTER — Telehealth: Payer: Self-pay | Admitting: Nurse Practitioner

## 2020-07-03 DIAGNOSIS — M25562 Pain in left knee: Secondary | ICD-10-CM | POA: Diagnosis not present

## 2020-07-03 NOTE — Telephone Encounter (Signed)
Patient called stating he has tried to print off his labs on his computer, BMP and CBC, but for some reason he can't. He requested for someone to call him before 9am, because he has to leave home at 9am. Please call.

## 2020-07-03 NOTE — Telephone Encounter (Signed)
New Message ° ° °Patient is returning call in reference to lab results.  °

## 2020-07-04 ENCOUNTER — Other Ambulatory Visit: Payer: Self-pay | Admitting: *Deleted

## 2020-07-04 DIAGNOSIS — I10 Essential (primary) hypertension: Secondary | ICD-10-CM

## 2020-07-04 MED ORDER — FUROSEMIDE 20 MG PO TABS: 20.0000 mg | ORAL_TABLET | Freq: Every day | ORAL | 3 refills | Status: DC

## 2020-07-06 DIAGNOSIS — M25562 Pain in left knee: Secondary | ICD-10-CM | POA: Diagnosis not present

## 2020-07-13 DIAGNOSIS — M25562 Pain in left knee: Secondary | ICD-10-CM | POA: Diagnosis not present

## 2020-07-27 DIAGNOSIS — Z4789 Encounter for other orthopedic aftercare: Secondary | ICD-10-CM | POA: Diagnosis not present

## 2020-07-31 ENCOUNTER — Telehealth: Payer: Self-pay | Admitting: *Deleted

## 2020-07-31 ENCOUNTER — Other Ambulatory Visit: Payer: Self-pay

## 2020-07-31 ENCOUNTER — Other Ambulatory Visit: Payer: HMO

## 2020-07-31 DIAGNOSIS — I10 Essential (primary) hypertension: Secondary | ICD-10-CM

## 2020-07-31 LAB — BASIC METABOLIC PANEL
BUN/Creatinine Ratio: 21 (ref 10–24)
BUN: 39 mg/dL — ABNORMAL HIGH (ref 8–27)
CO2: 25 mmol/L (ref 20–29)
Calcium: 10.2 mg/dL (ref 8.6–10.2)
Chloride: 105 mmol/L (ref 96–106)
Creatinine, Ser: 1.87 mg/dL — ABNORMAL HIGH (ref 0.76–1.27)
GFR calc Af Amer: 39 mL/min/{1.73_m2} — ABNORMAL LOW (ref >59)
GFR calc non Af Amer: 34 mL/min/{1.73_m2} — ABNORMAL LOW (ref >59)
Glucose: 61 mg/dL — ABNORMAL LOW (ref 65–99)
Potassium: 5.2 mmol/L (ref 3.5–5.2)
Sodium: 142 mmol/L (ref 134–144)

## 2020-07-31 NOTE — Telephone Encounter (Signed)
S/w pt came in today to get repeat lab work stated could not see results after pt was called for labs.  Computer states pt looked at labs.  Printed copies of all recent labs and gave to pt.

## 2020-08-01 ENCOUNTER — Other Ambulatory Visit: Payer: Self-pay | Admitting: *Deleted

## 2020-08-01 DIAGNOSIS — R7989 Other specified abnormal findings of blood chemistry: Secondary | ICD-10-CM

## 2020-08-01 DIAGNOSIS — R3912 Poor urinary stream: Secondary | ICD-10-CM | POA: Diagnosis not present

## 2020-08-01 DIAGNOSIS — N401 Enlarged prostate with lower urinary tract symptoms: Secondary | ICD-10-CM | POA: Diagnosis not present

## 2020-08-09 ENCOUNTER — Other Ambulatory Visit: Payer: HMO | Admitting: *Deleted

## 2020-08-09 ENCOUNTER — Other Ambulatory Visit: Payer: Self-pay

## 2020-08-09 DIAGNOSIS — R7989 Other specified abnormal findings of blood chemistry: Secondary | ICD-10-CM | POA: Diagnosis not present

## 2020-08-09 LAB — BASIC METABOLIC PANEL
BUN/Creatinine Ratio: 19 (ref 10–24)
BUN: 31 mg/dL — ABNORMAL HIGH (ref 8–27)
CO2: 27 mmol/L (ref 20–29)
Calcium: 10.1 mg/dL (ref 8.6–10.2)
Chloride: 107 mmol/L — ABNORMAL HIGH (ref 96–106)
Creatinine, Ser: 1.6 mg/dL — ABNORMAL HIGH (ref 0.76–1.27)
GFR calc Af Amer: 47 mL/min/{1.73_m2} — ABNORMAL LOW (ref >59)
GFR calc non Af Amer: 41 mL/min/{1.73_m2} — ABNORMAL LOW (ref >59)
Glucose: 47 mg/dL — ABNORMAL LOW (ref 65–99)
Potassium: 4.3 mmol/L (ref 3.5–5.2)
Sodium: 143 mmol/L (ref 134–144)

## 2020-08-17 ENCOUNTER — Telehealth: Payer: Self-pay | Admitting: Nurse Practitioner

## 2020-08-17 NOTE — Telephone Encounter (Signed)
Patient called state he is having issues with constipation and is seeking help

## 2020-08-17 NOTE — Telephone Encounter (Signed)
Line rings no answer

## 2020-08-20 ENCOUNTER — Other Ambulatory Visit: Payer: Self-pay | Admitting: Cardiology

## 2020-08-20 DIAGNOSIS — I480 Paroxysmal atrial fibrillation: Secondary | ICD-10-CM

## 2020-08-20 NOTE — Telephone Encounter (Signed)
Patient last seen in this office 2015. He has called back and scheduled his appointment.

## 2020-09-20 ENCOUNTER — Ambulatory Visit: Payer: HMO | Admitting: Physician Assistant

## 2020-09-20 ENCOUNTER — Ambulatory Visit (INDEPENDENT_AMBULATORY_CARE_PROVIDER_SITE_OTHER)
Admission: RE | Admit: 2020-09-20 | Discharge: 2020-09-20 | Disposition: A | Discharge status: Home / Self Care | Payer: HMO | Source: Ambulatory Visit | Attending: Physician Assistant | Admitting: Physician Assistant

## 2020-09-20 ENCOUNTER — Encounter: Payer: Self-pay | Admitting: Physician Assistant

## 2020-09-20 ENCOUNTER — Other Ambulatory Visit (INDEPENDENT_AMBULATORY_CARE_PROVIDER_SITE_OTHER): Payer: HMO

## 2020-09-20 ENCOUNTER — Other Ambulatory Visit: Payer: Self-pay

## 2020-09-20 VITALS — BP 126/60 | HR 76 | Ht 67.75 in | Wt 144.1 lb

## 2020-09-20 DIAGNOSIS — Z85038 Personal history of other malignant neoplasm of large intestine: Secondary | ICD-10-CM

## 2020-09-20 DIAGNOSIS — R634 Abnormal weight loss: Secondary | ICD-10-CM

## 2020-09-20 DIAGNOSIS — K5900 Constipation, unspecified: Secondary | ICD-10-CM

## 2020-09-20 LAB — CBC WITH DIFFERENTIAL/PLATELET
Basophils Absolute: 0 10*3/uL (ref 0.0–0.1)
Basophils Relative: 0.4 % (ref 0.0–3.0)
Eosinophils Absolute: 0.2 10*3/uL (ref 0.0–0.7)
Eosinophils Relative: 1.7 % (ref 0.0–5.0)
HCT: 37.8 % — ABNORMAL LOW (ref 39.0–52.0)
Hemoglobin: 12.7 g/dL — ABNORMAL LOW (ref 13.0–17.0)
Lymphocytes Relative: 14.9 % (ref 12.0–46.0)
Lymphs Abs: 1.3 10*3/uL (ref 0.7–4.0)
MCHC: 33.7 g/dL (ref 30.0–36.0)
MCV: 93.2 fl (ref 78.0–100.0)
Monocytes Absolute: 0.8 10*3/uL (ref 0.1–1.0)
Monocytes Relative: 9.3 % (ref 3.0–12.0)
Neutro Abs: 6.7 10*3/uL (ref 1.4–7.7)
Neutrophils Relative %: 73.7 % (ref 43.0–77.0)
Platelets: 250 10*3/uL (ref 150.0–400.0)
RBC: 4.06 Mil/uL — ABNORMAL LOW (ref 4.22–5.81)
RDW: 12.8 % (ref 11.5–15.5)
WBC: 9 10*3/uL (ref 4.0–10.5)

## 2020-09-20 LAB — COMPREHENSIVE METABOLIC PANEL
ALT: 11 U/L (ref 0–53)
AST: 15 U/L (ref 0–37)
Albumin: 4.2 g/dL (ref 3.5–5.2)
Alkaline Phosphatase: 73 U/L (ref 39–117)
BUN: 33 mg/dL — ABNORMAL HIGH (ref 6–23)
CO2: 24 mEq/L (ref 19–32)
Calcium: 10 mg/dL (ref 8.4–10.5)
Chloride: 96 mEq/L (ref 96–112)
Creatinine, Ser: 1.64 mg/dL — ABNORMAL HIGH (ref 0.40–1.50)
GFR: 39.7 mL/min — ABNORMAL LOW (ref >60.00)
Glucose, Bld: 329 mg/dL — ABNORMAL HIGH (ref 70–99)
Potassium: 4.8 mEq/L (ref 3.5–5.1)
Sodium: 129 mEq/L — ABNORMAL LOW (ref 135–145)
Total Bilirubin: 0.5 mg/dL (ref 0.2–1.2)
Total Protein: 7.1 g/dL (ref 6.0–8.3)

## 2020-09-20 LAB — TSH: TSH: 1.81 u[IU]/mL (ref 0.35–4.50)

## 2020-09-20 MED ORDER — NA SULFATE-K SULFATE-MG SULF 17.5-3.13-1.6 GM/177ML PO SOLN: 1.0000 | Freq: Once | ORAL | 0 refills | Status: AC

## 2020-09-20 NOTE — Progress Notes (Signed)
Subjective:    Patient ID: Curtis Dunn, male    DOB: 1941/10/22, 79 y.o.   MRN: [id number]  HPI Curtis Dunn is a pleasant 79 year old white male, previously known to Dr. Delfin Edis who was last seen here in 2015.  He comes in today as a new patient with complaints of new onset of severe constipation. Patient has history of hypertension, prior CVA, insulin-dependent diabetes mellitus, atrial fibrillation for which he is on Eliquis, and has remote history of colon cancer diagnosed in 2001.  He is status post right hemicolectomy for a cecal adenocarcinoma. Last colonoscopy was done in 2015 with finding of patent ileocolonic anastomosis, he had three 5 mm ascending colon polyps removed all of which were tubular adenomas and was indicated for 5-year interval follow-up. Patient says he has never had issues with constipation, and that his current symptoms started in mid September with abrupt onset of obstipation.  He says at that time he went for almost 10 days without a bowel movement.  He started taking Senokot S up to 4 tablets/day without any response, then MiraLAX, then took several doses of polyethylene glycol that he had at home which may have expired, then finally took a bottle of mag citrate which produced fairly good results.  He says he was passing thick soft stool and not very much at a time.  Over the past couple of weeks he took another bottle of magnesium citrate, and I believe has continued taking polyethylene glycol that he had at home.  He is passing small amounts of stool daily but relates pellet-like smaller stools.  He is not noted any melena or hematochezia.  He has no complaints of abdominal pain or distention, no rectal pain or pressure.  Appetite has been okay, he says he has really not been able to taste anything for many years, however weight is down about 15 pounds over the past 2 months.  Again no nausea or vomiting. Only new medication is tolterodine which he had been  started on for overactive bladder.  Review of Systems Pertinent positive and negative review of systems were noted in the above HPI section.  All other review of systems was otherwise negative.  Outpatient Encounter Medications as of 09/20/2020  Medication Sig  . allopurinol (ZYLOPRIM) 100 MG tablet Take 100 mg by mouth daily with breakfast.   . amLODipine (NORVASC) 5 MG tablet Take 1 tablet by mouth once daily  . apixaban (ELIQUIS) 5 MG TABS tablet Take 1 tablet (5 mg total) by mouth 2 (two) times daily.  . Ascorbic Acid (VITAMIN C) 1000 MG tablet Take 1,000 mg by mouth every other day.   [name] aspirin EC 81 MG EC tablet Take 1 tablet (81 mg total) by mouth daily.  . Cholecalciferol (VITAMIN D3) 250 MCG (10000 UT) capsule Take 10,000 Units by mouth daily with breakfast.   . Coenzyme Q10 (COQ10 PO) Take 1 capsule by mouth daily with breakfast.  . Continuous Blood Gluc Sensor (FREESTYLE LIBRE 14 DAY SENSOR) MISC USE TO MONITOR BLOOD GLUCOSE. CHANGE EVERY 14 DAYS.  [name] Cyanocobalamin (VITAMIN B-12) 5000 MCG SUBL Take 5,000 mcg by mouth daily with breakfast.  . Hawthorn 565 MG CAPS Take 565 mg by mouth daily with breakfast.  . hydrALAZINE (APRESOLINE) 50 MG tablet Take 1 tablet by mouth twice daily (Patient taking differently: Take 50 mg by mouth in the morning and at bedtime. Breakfast & supper)  . insulin aspart (NOVOLOG) 100 UNIT/ML injection Inject 10 Units into the  skin 3 (three) times daily before meals.   . insulin glargine (LANTUS) 100 UNIT/ML injection Inject 0.12 mLs (12 Units total) into the skin 2 (two) times daily. Inject 12 units at breakfast and 10 units at bedtime (Patient taking differently: Inject 12 Units into the skin See admin instructions. Inject 14 units at breakfast and 12 units at bedtime)  . lovastatin (MEVACOR) 20 MG tablet Take 20 mg by mouth daily with breakfast.   . metoprolol succinate (TOPROL-XL) 25 MG 24 hr tablet Take 1 tablet by mouth once daily  . Nutritional  Supplements (DHEA PO) Take 100 mg by mouth daily with breakfast.   . Omega 3-6-9 Fatty Acids (TRIPLE OMEGA-3-6-9 PO) Take 1 capsule by mouth daily with breakfast.  . ramipril (ALTACE) 10 MG capsule Take 1 capsule by mouth twice daily (Patient taking differently: Take 10 mg by mouth in the morning and at bedtime. Breakfast & supper)  . tamsulosin (FLOMAX) 0.4 MG CAPS capsule Take 0.4 mg by mouth. Client states he takes it as needed.  . vitamin E 1000 UNIT capsule Take 1,000 Units by mouth every evening.   . Zinc 50 MG CAPS Take 50 mg by mouth daily with breakfast.   . [DISCONTINUED] tolterodine (DETROL) 2 MG tablet Take 2 mg by mouth at bedtime.  . Na Sulfate-K Sulfate-Mg Sulf 17.5-3.13-1.6 GM/177ML SOLN Take 1 kit by mouth once for 1 dose. Apply Coupon=BIN: [id number] PCN: CN GROUP: [id number] ID: [id number]  . [DISCONTINUED] Saw Palmetto 450 MG CAPS Take 900 mg by mouth daily with breakfast.    No facility-administered encounter medications on file as of 09/20/2020.   No Known Allergies Patient Active Problem List   Diagnosis Date Noted  . Displaced comminuted fracture of left patella, initial encounter for closed fracture 03/16/2020  . Fractured patella 03/09/2020  . Cerebellar stroke ([place]) 11/15/2019  . Acute CVA (cerebrovascular accident) ([place]) 11/14/2019  . Bilateral impacted cerumen 10/10/2019  . Fungal otitis externa 10/10/2019  . Sensorineural hearing loss (SNHL) of both ears 10/10/2019  . Pain in joint of right ankle 05/12/2019  . Cellulitis and abscess of foot, except toes 05/03/2019  . Paronychia of great toe of right foot 05/03/2019  . Multiple fractures of ribs of both sides 12/29/2015  . Flail chest 12/25/2015  . AKI (acute kidney injury) ([place]) 12/25/2015  . Hemopneumothorax on left 12/25/2015  . IDDM (insulin dependent diabetes mellitus) 12/25/2015  . History of colon cancer 08/21/2014  . Acute blood loss anemia 02/11/2014  . Fracture dislocation of cervical spine ([place])  02/10/2014  . C1 cervical fracture ([place]) 02/10/2014  . Fall 02/10/2014  . Distal radius fracture, right 02/10/2014  . Concussion 02/10/2014  . Facial laceration 02/10/2014  . Anticoagulated 02/10/2014  . PAF (paroxysmal atrial fibrillation) ([place]) 09/08/2012  . Palpitations 03/05/2012  . MALIGNANT NEOPLASM OF CECUM 12/11/2007  . COLONIC POLYPS 12/11/2007  . Type 1 diabetes mellitus ([place]) 12/11/2007  . Essential hypertension 12/11/2007   Social History   Socioeconomic History  . Marital status: Widowed    Spouse name: Not on file  . Number of children: 2  . Years of education: Not on file  . Highest education level: Not on file  Occupational History  . 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 Smoking status: Former Smoker    Types: Cigars  . Smokeless tobacco: Never Used  . Tobacco comment: occasional cigar  no longer   Vaping Use  .  Vaping Use: Never used  Substance and Sexual Activity  . Alcohol use: Not Currently    Comment: rare  . Drug use: No  . Sexual activity: Not Currently  Other Topics Concern  . Not on file  Social History Narrative   Lives alone [place] 2 story [place] [address] [place]   Has one cat named Mousse   Has one daughter and one son. Daughter lives in [place]   Social Determinants of Health   Financial Resource Strain: Low Risk   . Difficulty of Paying Living Expenses: Not hard at all  Food Insecurity: No Food Insecurity  . Worried About Charity fundraiser in the Last Year: Never true  . Ran Out of Food in the Last Year: Never true  Transportation Needs: No Transportation Needs  . Lack of Transportation (Medical): No  . Lack of Transportation (Non-Medical): No  Physical Activity: Sufficiently Active  . Days of Exercise per Week: 3 days  . Minutes of Exercise per Session: 120 min  Stress: No Stress Concern Present  . Feeling of Stress : Only a little  Social Connections: Moderately  Isolated  . Frequency of Communication with Friends and Family: More than three times a week  . Frequency of Social Gatherings with Friends and Family: More than three times a week  . Attends Religious Services: Never  . Active Member of Clubs or Organizations: Yes  . Attends Archivist Meetings: More than 4 times per year  . Marital Status: Widowed  Intimate Partner Violence:   . Fear of Current or Ex-Partner: Not on file  . Emotionally Abused: Not on file  . Physically Abused: Not on file  . Sexually Abused: Not on file    Curtis Dunn's family history includes Alzheimer's disease in his mother; Heart disease in his father; Other in his son.      Objective:    Vitals:   09/20/20 1318  BP: 126/60  Pulse: 76    Physical Exam Well-developed well-nourished elderly white male in no acute distress.  Height, Weight 144, BMI 22.0  HEENT; nontraumatic normocephalic, EOMI, PE RR LA, sclera anicteric. Oropharynx; not examined today Neck; supple, no JVD Cardiovascular; regular rate and rhythm with S1-S2, no murmur rub or gallop Pulmonary; Clear bilaterally Abdomen; soft, nontender, nondistended, no palpable mass or hepatosplenomegaly, bowel sounds are active, lower abdominal incisional scar Rectal; not done today Skin; benign exam, no jaundice rash or appreciable lesions Extremities; no clubbing cyanosis or edema skin warm and dry Neuro/Psych; alert and oriented x4, grossly nonfocal mood and affect appropriate       Assessment & Plan:   #16  79 year old white male with remote history of cecal adenocarcinoma status post right hemicolectomy 2001, and history of adenomatous colon polyps-last colonoscopy 2015 per Dr. Olevia Perches who presents now with abrupt onset of severe constipation about 6 weeks ago. Suspect he may have had some fecal impaction.  After multiple laxatives and doses of magnesium citrate he is having bowel movements but continues to pass very small pellet-like  stools and continues to struggle with significant constipation.  Rule out medication induced constipation i.e. tolterodine, rule out colonic neoplasm.  #2 atrial fibrillation 3.  Chronic anticoagulation-Eliquis 4.  Insulin-dependent diabetes mellitus 5.  Prior history of CVA 6.  Hypertension  Plan; CBC with differential, c-Met, TSH Patient advised to stop tolterodine Patient will be given a bowel purge with MiraLAX Gatorade, then start MiraLAX 17 g in 8 ounces of water  twice daily Patient has been scheduled for Colonoscopy with Dr. Ardis Hughs week of November 15.  He will be established with Dr. Ardis Hughs.  Procedure was discussed in detail with patient including indications risks and benefits and he is agreeable to proceed. He has completed COVID-19 vaccination. We will communicate with his cardiologist Dr. Marlou Porch to assure that holding Eliquis for 48 hours prior to procedure is reasonable for this patient. Further recommendations pending results of above.   Marice Guidone Genia Harold PA-C 09/20/2020   Cc: Reynold Bowen, MD

## 2020-09-20 NOTE — Patient Instructions (Addendum)
If you are age 79 or older, your body mass index should be between 23-30. Your Body mass index is 22.08 kg/m. If this is out of the aforementioned range listed, please consider follow up with your Primary Care Provider.  If you are age 47 or younger, your body mass index should be between 19-25. Your Body mass index is 22.08 kg/m. If this is out of the aformentioned range listed, please consider follow up with your Primary Care Provider.   You have been scheduled for a colonoscopy. Please follow written instructions given to you at your visit today.  Please pick up your prep supplies at the pharmacy within the next 1-3 days. If you use inhalers (even only as needed), please bring them with you on the day of your procedure.  Your provider has requested that you go to the basement level for lab work before leaving today. Press "B" on the elevator. The lab is located at the first door on the left as you exit the elevator.  Your provider has requested that you go to the basement level for a X-Ray before leaving today. Press "B" on the elevator. The Radiology is located straight ahead as you exit the elevator.  Due to recent changes in healthcare laws, you may see the results of your imaging and laboratory studies on MyChart before your provider has had a chance to review them.  We understand that in some cases there may be results that are confusing or concerning to you. Not all laboratory results come back in the same time frame and the provider may be waiting for multiple results in order to interpret others.  Please give [place] 48 hours in order for your provider to thoroughly review all the results before contacting the office for clarification of your results.   Amy Esterwood, PA-C recommends that you complete a bowel purge (to clean out your bowels). Please do the following: Purchase a bottle of Miralax over the counter as well as a box of 5 mg dulcolax tablets. Take 4 dulcolax tablets. Wait 1  hour. You will then drink 6-8 capfuls of Miralax mixed in an adequate amount of water/juice/gatorade (you may choose which of these liquids to drink) over the next 2-3 hours. You should expect results within 1 to 6 hours after completing the bowel purge. (Please complete your bowel purge this week) Once you have completed your bowel purge start using Miralax 17 grams in 8 ounces of water/juice twice daily.  STOP Tolterodine/Detrol.  Please call the office if you have any questions.  Follow up is pending the results of your x-ray, Colonoscopy and lab results.

## 2020-09-21 NOTE — Progress Notes (Signed)
I agree with the above note, plan 

## 2020-09-24 ENCOUNTER — Other Ambulatory Visit: Payer: Self-pay | Admitting: Nurse Practitioner

## 2020-09-24 NOTE — Telephone Encounter (Signed)
rx refill

## 2020-09-26 ENCOUNTER — Telehealth: Payer: Self-pay | Admitting: Physician Assistant

## 2020-09-26 ENCOUNTER — Telehealth: Payer: Self-pay

## 2020-09-26 NOTE — Telephone Encounter (Signed)
Pt has questions regarding purge meds. Please call

## 2020-09-26 NOTE — Telephone Encounter (Signed)
[facility] Pre-operative Risk Assessment     Request for surgical clearance:     Endoscopy Procedure  What type of surgery is being performed?     Colonoscopy  When is this surgery scheduled?     10/01/20  What type of clearance is required ?   Pharmacy  Are there any medications that need to be held prior to surgery and how long? Eliquis starting 2 days prior  Practice name and name of physician performing surgery?      [facility]  What is your office phone and fax number?      Phone- [phone]  [phone]  Anesthesia type (None, local, MAC, general) ?       MAC

## 2020-09-26 NOTE — Telephone Encounter (Signed)
Patient called states he is confused with some things are written on the post visit paperwork. He says that in multiple locations it states  harvest and he would like more info on that

## 2020-09-26 NOTE — Telephone Encounter (Signed)
Patient had a few questions about the procedure and prep which were answered to his satisfaction. Reviewed the written instructions with him.

## 2020-09-27 NOTE — Telephone Encounter (Signed)
As I have not seen him since 12/2019, I would need to see him in office for surgical clearance.  If this is just a clearance to hold Eliquis, this may also be obtained by cardiology unless they are specifically requesting clearance from a stroke standpoint.

## 2020-09-29 ENCOUNTER — Other Ambulatory Visit: Payer: Self-pay | Admitting: Nurse Practitioner

## 2020-09-30 ENCOUNTER — Telehealth: Payer: Self-pay | Admitting: Gastroenterology

## 2020-09-30 NOTE — Telephone Encounter (Signed)
[facility]I: Dr. Ardis Hughs After hours call: Question about Eliquis  Patient has a colonoscopy scheduled with Dr. Ardis Hughs tomorrow. He called to clarify when to hold his Eliquis (prescribed by Dr. Marlou Porch), as he had not heard from our office. He held both doses of Eliquis yesterday, assuming that's what he needed to do.  Chart reviewed. Request for anticoagulation guidance sent via EPIC to neurologist, who asked that we obtain clearance from cardiology as the indication for Eliquis is PAF.   I spoke with Dr. Fransico Him this morning, who is on call for [facility]. She reviewed his record electronic health record. She agreed that he could continue to hold his Eliquis until his procedure tomorrow.  I called the patient back and updated him with the plan to proceed with colonoscopy 10/01/20.

## 2020-10-01 ENCOUNTER — Encounter: Payer: Self-pay | Admitting: Gastroenterology

## 2020-10-01 ENCOUNTER — Other Ambulatory Visit: Payer: Self-pay

## 2020-10-01 ENCOUNTER — Ambulatory Visit (AMBULATORY_SURGERY_CENTER): Payer: HMO | Admitting: Gastroenterology

## 2020-10-01 VITALS — BP 83/62 | HR 84 | Temp 97.5°F | Resp 14 | Ht 67.0 in | Wt 144.0 lb

## 2020-10-01 DIAGNOSIS — K5900 Constipation, unspecified: Secondary | ICD-10-CM | POA: Diagnosis not present

## 2020-10-01 DIAGNOSIS — R194 Change in bowel habit: Secondary | ICD-10-CM

## 2020-10-01 DIAGNOSIS — K5730 Diverticulosis of large intestine without perforation or abscess without bleeding: Secondary | ICD-10-CM

## 2020-10-01 DIAGNOSIS — Z85038 Personal history of other malignant neoplasm of large intestine: Secondary | ICD-10-CM

## 2020-10-01 MED ORDER — SODIUM CHLORIDE 0.9 % IV SOLN: 500.0000 mL | INTRAVENOUS | Status: DC

## 2020-10-01 NOTE — Telephone Encounter (Signed)
I see pt is having the colonoscopy today. Arrived 0930.

## 2020-10-01 NOTE — Progress Notes (Signed)
To Pacu, vss. Report to Rn.tb ?

## 2020-10-01 NOTE — Telephone Encounter (Signed)
Noted in request to Dr. Felecia Shelling.

## 2020-10-01 NOTE — Op Note (Addendum)
[facility] Patient Name: Curtis Dunn Procedure Date: 10/01/2020 9:00 AM MRN: [id number] Endoscopist: Milus Banister , MD Age: 79 Referring MD:  Date of Birth: 03-29-1941 Gender: Male Account #: [id number] Procedure:                Colonoscopy Indications:              Change in bowel habits, constipation. Colon cancer                            2001 s/p right hemicolectomy; last colonoscopy 2015 Medicines:                Monitored Anesthesia Care Procedure:                Pre-Anesthesia Assessment:                           - Prior to the procedure, a History and Physical                            was performed, and patient medications and                            allergies were reviewed. The patient's tolerance of                            previous anesthesia was also reviewed. The risks                            and benefits of the procedure and the sedation                            options and risks were discussed with the patient.                            All questions were answered, and informed consent                            was obtained. Prior Anticoagulants: The patient has                            taken Eliquis (apixaban), last dose was 2 days                            prior to procedure. ASA Grade Assessment: III - A                            patient with severe systemic disease. After                            reviewing the risks and benefits, the patient was                            deemed in satisfactory condition to undergo the  procedure.                           After obtaining informed consent, the colonoscope                            was passed under direct vision. Throughout the                            procedure, the patient's blood pressure, pulse, and                            oxygen saturations were monitored continuously. The                            Colonoscope was introduced through the  anus and                            advanced to the the IC anastomosis. The colonoscopy                            was performed without difficulty. The patient                            tolerated the procedure well. The quality of the                            bowel preparation was good. The rectum was                            photographed. Scope In: 9:22:19 AM Scope Out: 9:37:37 AM Scope Withdrawal Time: 0 hours 7 minutes 44 seconds  Total Procedure Duration: 0 hours 15 minutes 18 seconds  Findings:                 Normal appearing ileocolonic anastomosis s/p remote                            right hemicolectomy.                           Multiple small and large-mouthed diverticula were                            found in the left colon.                           The exam was otherwise without abnormality on                            direct and retroflexion views. Complications:            No immediate complications. Estimated blood loss:                            None. Estimated Blood Loss:     Estimated blood loss: none. Impression:               -  Diverticulosis in the left colon.                           - The examination was otherwise normal on direct                            and retroflexion views.                           - Normal appearing ileocolonic anastomosis s/p                            remote right hemicolectomy.                           - No polyps or cancers. Recommendation:           - Patient has a contact number available for                            emergencies. The signs and symptoms of potential                            delayed complications were discussed with the                            patient. Return to normal activities tomorrow.                            Written discharge instructions were provided to the                            patient.                           - Resume previous diet.                           - Continue present  medications. Please take two                            doses of miralax every single day and call Dr.                            Ardis Hughs' office in 4 weeks to report on your                            response.                           - You do not need any further colon cancer                            screening tests (including stool testing). These  types of tests generally stop around age 4-80.                           - It is ok to resume your blood thinner today. Milus Banister, MD 10/01/2020 9:42:26 AM This report has been signed electronically.

## 2020-10-01 NOTE — Telephone Encounter (Signed)
Patient was cleared by Dr. Fransico Him at Cardiology

## 2020-10-01 NOTE — Patient Instructions (Signed)
HANDOUTS PROVIDED ON: DIVERTICULOSIS    You may resume your previous diet and medication schedule.  You may resume your Eliquis today as well.  Take 2 capfuls of Miralax once every day and let Dr. Ardis Hughs know in 4 weeks if this has been helpful.  Thank you for allowing [place] to care for you today!!!   YOU HAD AN ENDOSCOPIC PROCEDURE TODAY AT [place]:   Refer to the procedure report that was given to you for any specific questions about what was found during the examination.  If the procedure report does not answer your questions, please call your gastroenterologist to clarify.  If you requested that your care partner not be given the details of your procedure findings, then the procedure report has been included in a sealed envelope for you to review at your convenience later.  YOU SHOULD EXPECT: Some feelings of bloating in the abdomen. Passage of more gas than usual.  Walking can help get rid of the air that was put into your GI tract during the procedure and reduce the bloating. If you had a lower endoscopy (such as a colonoscopy or flexible sigmoidoscopy) you may notice spotting of blood in your stool or on the toilet paper. If you underwent a bowel prep for your procedure, you may not have a normal bowel movement for a few days.  Please Note:  You might notice some irritation and congestion in your nose or some drainage.  This is from the oxygen used during your procedure.  There is no need for concern and it should clear up in a day or so.  SYMPTOMS TO REPORT IMMEDIATELY:   Following lower endoscopy (colonoscopy or flexible sigmoidoscopy):  Excessive amounts of blood in the stool  Significant tenderness or worsening of abdominal pains  Swelling of the abdomen that is new, acute  Fever of 100F or higher  For urgent or emergent issues, a gastroenterologist can be reached at any hour by calling [phone]. Do not use MyChart messaging for urgent concerns.     DIET:  We do recommend a small meal at first, but then you may proceed to your regular diet.  Drink plenty of fluids but you should avoid alcoholic beverages for 24 hours.  ACTIVITY:  You should plan to take it easy for the rest of today and you should NOT DRIVE or use heavy machinery until tomorrow (because of the sedation medicines used during the test).    FOLLOW UP: Our staff will call the number listed on your records Wednesday morning between 7:15 am and 8:15 am to check on you and address any questions or concerns that you may have regarding the information given to you following your procedure. If we do not reach you, we will leave a message.  We will attempt to reach you two times.  During this call, we will ask if you have developed any symptoms of COVID 19. If you develop any symptoms (ie: fever, flu-like symptoms, shortness of breath, cough etc.) before then, please call [phone].  If you test positive for Covid 19 in the 2 weeks post procedure, please call and report this information to [place].    If any biopsies were taken you will be contacted by phone or by letter within the next 1-3 weeks.  Please call us at [phone] if you have not heard about the biopsies in 3 weeks.    SIGNATURES/CONFIDENTIALITY: You and/or your care partner have signed paperwork which will be entered  into your electronic medical record.  These signatures attest to the fact that that the information above on your After Visit Summary has been reviewed and is understood.  Full responsibility of the confidentiality of this discharge information lies with you and/or your care-partner.

## 2020-10-02 ENCOUNTER — Telehealth: Payer: Self-pay | Admitting: Gastroenterology

## 2020-10-02 NOTE — Telephone Encounter (Signed)
Pt is requesting a call back from a nurse in regards to the results from the procedure he had yesterday.

## 2020-10-02 NOTE — Telephone Encounter (Signed)
The pt was concerned that he had not gotten a call from a procedure nurse today.  He has been advised that he will be called within the next day or two.

## 2020-10-03 ENCOUNTER — Telehealth: Payer: Self-pay | Admitting: *Deleted

## 2020-10-03 NOTE — Telephone Encounter (Signed)
  Follow up Call-  Call back number 10/01/2020  Post procedure Call Back phone  # [phone]  Permission to leave phone message Yes  Some recent data might be hidden     Patient questions:  Do you have a fever, pain , or abdominal swelling? No. Pain Score  0 *  Have you tolerated food without any problems? Yes.    Have you been able to return to your normal activities? Yes.    Do you have any questions about your discharge instructions: Diet   No. Medications  No. Follow up visit  No.  Do you have questions or concerns about your Care? No.  Actions: * If pain score is 4 or above: No action needed, pain <4.  1. Have you developed a fever since your procedure? no  2.   Have you had an respiratory symptoms (SOB or cough) since your procedure? no  3.   Have you tested positive for COVID 19 since your procedure no  4.   Have you had any family members/close contacts diagnosed with the COVID 19 since your procedure?  no   If yes to any of these questions please route to Joylene John, RN and Joella Prince, RN

## 2020-10-04 DIAGNOSIS — D1801 Hemangioma of skin and subcutaneous tissue: Secondary | ICD-10-CM | POA: Diagnosis not present

## 2020-10-04 DIAGNOSIS — D2271 Melanocytic nevi of right lower limb, including hip: Secondary | ICD-10-CM | POA: Diagnosis not present

## 2020-10-04 DIAGNOSIS — D2272 Melanocytic nevi of left lower limb, including hip: Secondary | ICD-10-CM | POA: Diagnosis not present

## 2020-10-04 DIAGNOSIS — D225 Melanocytic nevi of trunk: Secondary | ICD-10-CM | POA: Diagnosis not present

## 2020-10-04 DIAGNOSIS — L821 Other seborrheic keratosis: Secondary | ICD-10-CM | POA: Diagnosis not present

## 2020-10-04 DIAGNOSIS — Z85828 Personal history of other malignant neoplasm of skin: Secondary | ICD-10-CM | POA: Diagnosis not present

## 2020-10-06 ENCOUNTER — Telehealth: Payer: Self-pay | Admitting: Gastroenterology

## 2020-10-06 NOTE — Telephone Encounter (Signed)
Patient called with concern of anal leakage/sphincter control issues that he has been having since the colonoscopy and starting the MiraLAX twice daily.  I reassured him that the colonoscopy appeared to be uneventful and that nothing was done to the sphincter itself.  I advised him just to discontinue the MiraLAX for the remainder of the weekend and that Dr. Ardis Hughs and his staff would reach out to him on Monday to get an update on his condition.  He says that he had been in contact with Beth, but I do not see any recent phone notes from her.

## 2020-10-08 NOTE — Telephone Encounter (Addendum)
Patty, Please see how he is doing sometime this morning.  He should have stopped his 2 doses of MiraLAX daily.  Instead I would like him to start Citrucel tapering upwards as we generally tell people.  However, orange flavored.  He needs next available office visit with me.  Do not double book.  Thank you

## 2020-10-08 NOTE — Telephone Encounter (Signed)
The pt has stopped miralax and will begin citrucel as recommended.  He has also been scheduled for an office visit in January.  He will call if needed in the meantime.

## 2020-10-09 ENCOUNTER — Telehealth: Payer: Self-pay | Admitting: Gastroenterology

## 2020-10-09 NOTE — Telephone Encounter (Signed)
I spoke with the pt and he wanted to discuss how to take the citrucel. I explained again that as we discussed yesterday he should start with a small spoonful and increase to a heaping spoonful over about a week.  He will do that and keep his appt as planned for follow up

## 2020-10-16 ENCOUNTER — Other Ambulatory Visit: Payer: Self-pay

## 2020-10-16 DIAGNOSIS — R3915 Urgency of urination: Secondary | ICD-10-CM | POA: Diagnosis not present

## 2020-10-16 DIAGNOSIS — D414 Neoplasm of uncertain behavior of bladder: Secondary | ICD-10-CM | POA: Diagnosis not present

## 2020-10-16 DIAGNOSIS — N401 Enlarged prostate with lower urinary tract symptoms: Secondary | ICD-10-CM | POA: Diagnosis not present

## 2020-10-16 DIAGNOSIS — R310 Gross hematuria: Secondary | ICD-10-CM | POA: Diagnosis not present

## 2020-10-16 NOTE — Patient Outreach (Signed)
  [facility]) Care Management Chronic Special Needs Program    10/16/2020  Name: Curtis Dunn, DOB: 01-16-1941  MRN: [id number]   [name] is enrolled in a chronic special needs plan for Diabetes.  [facility] will continue to provide services for this member through 11/16/20.  The [employer] care management team will assume care 11/17/2020.   Quinn Plowman RN,BSN,CCM [facility] [phone]

## 2020-10-18 ENCOUNTER — Other Ambulatory Visit: Payer: Self-pay

## 2020-10-18 ENCOUNTER — Telehealth: Payer: Self-pay | Admitting: Cardiology

## 2020-10-18 ENCOUNTER — Ambulatory Visit: Payer: HMO | Admitting: Internal Medicine

## 2020-10-18 NOTE — Patient Outreach (Deleted)
[facility]) Care Management   10/18/2020  Curtis Dunn 11-25-1940 [id number]  [name] is an 79 y.o. male  Subjective:   Objective:   Review of Systems  Physical Exam  Encounter Medications:   Outpatient Encounter Medications as of 10/18/2020  Medication Sig Note  . allopurinol (ZYLOPRIM) 100 MG tablet Take 100 mg by mouth daily with breakfast.    . amLODipine (NORVASC) 5 MG tablet Take 1 tablet by mouth once daily   . apixaban (ELIQUIS) 5 MG TABS tablet Take 1 tablet (5 mg total) by mouth 2 (two) times daily. 10/01/2020: Last dose 09/29/2020.  . Ascorbic Acid (VITAMIN C) 1000 MG tablet Take 1,000 mg by mouth every other day.    [name] aspirin EC 81 MG EC tablet Take 1 tablet (81 mg total) by mouth daily.   . Cholecalciferol (VITAMIN D3) 250 MCG (10000 UT) capsule Take 10,000 Units by mouth daily with breakfast.    . Coenzyme Q10 (COQ10 PO) Take 1 capsule by mouth daily with breakfast.   . Continuous Blood Gluc Sensor (FREESTYLE LIBRE 14 DAY SENSOR) MISC USE TO MONITOR BLOOD GLUCOSE. CHANGE EVERY 14 DAYS.   [name] Cyanocobalamin (VITAMIN B-12) 5000 MCG SUBL Take 5,000 mcg by mouth daily with breakfast.   . Hawthorn 565 MG CAPS Take 565 mg by mouth daily with breakfast.   . hydrALAZINE (APRESOLINE) 50 MG tablet Take 1 tablet by mouth twice daily (Patient taking differently: Take 50 mg by mouth in the morning and at bedtime. Breakfast & supper)   . insulin aspart (NOVOLOG) 100 UNIT/ML injection Inject 10 Units into the skin 3 (three) times daily before meals.    . insulin glargine (LANTUS) 100 UNIT/ML injection Inject 0.12 mLs (12 Units total) into the skin 2 (two) times daily. Inject 12 units at breakfast and 10 units at bedtime (Patient taking differently: Inject 12 Units into the skin See admin instructions. Inject 14 units at breakfast and 12 units at bedtime)   . lovastatin (MEVACOR) 20 MG tablet Take 20 mg by mouth daily with breakfast.    . metoprolol  succinate (TOPROL-XL) 25 MG 24 hr tablet Take 1 tablet by mouth once daily   . Nutritional Supplements (DHEA PO) Take 100 mg by mouth daily with breakfast.    . Omega 3-6-9 Fatty Acids (TRIPLE OMEGA-3-6-9 PO) Take 1 capsule by mouth daily with breakfast.   . ramipril (ALTACE) 10 MG capsule Take 1 capsule by mouth twice daily   . tamsulosin (FLOMAX) 0.4 MG CAPS capsule Take 0.4 mg by mouth. Client states he takes it as needed.   . vitamin E 1000 UNIT capsule Take 1,000 Units by mouth every evening.    . Zinc 50 MG CAPS Take 50 mg by mouth daily with breakfast.     No facility-administered encounter medications on file as of 10/18/2020.    Functional Status:   In your present state of health, do you have any difficulty performing the following activities: 06/15/2020 03/16/2020  Hearing? Y -  Comment client states he will follow up with the [facility] regarding new hearing aids. -  Vision? N -  Difficulty concentrating or making decisions? N -  Walking or climbing stairs? N -  Dressing or bathing? N -  Doing errands, shopping? N N  Preparing Food and eating ? N -  Using the Toilet? N -  In the past six months, have you accidently leaked urine? N -  Do you have problems with loss  of bowel control? N -  Managing your Medications? N -  Managing your Finances? N -  Housekeeping or managing your Housekeeping? N -  Some recent data might be hidden    Fall/Depression Screening:    Fall Risk  06/27/2016 06/26/2016  Falls in the past year? No Yes   PHQ 2/9 Scores 12/21/2019 09/12/2019 06/10/2019 06/10/2019 06/27/2016 06/26/2016  PHQ - 2 Score 0 0 0 0 0 0    Assessment:   Goals Addressed   None     Plan:

## 2020-10-18 NOTE — Telephone Encounter (Signed)
Let's go ahead and bridge him with lovenox.  [name], MD

## 2020-10-18 NOTE — Telephone Encounter (Signed)
I spoke with patient. Explained that we will need to do a bridge. Patient has a lot of questions about the cause of his stroke. answered to the best of my ability. He will call us when procedure scheduled to set up appointment to review bridge.

## 2020-10-18 NOTE — Telephone Encounter (Signed)
Dr. Marlou Porch, We are asked to hold eliquis for three days for TURB for tumor. In the past, he has been deemed high risk to be off eliquis for that length of time. Can you provide recommendations for holding eliquis for 3 days?

## 2020-10-18 NOTE — Telephone Encounter (Signed)
I agree pt is high risk due to his hx of stroke. I will await Dr. Marlou Porch thoughts.

## 2020-10-18 NOTE — Telephone Encounter (Signed)
   [facility] Pre-operative Risk Assessment    HEARTCARE STAFF: - Please ensure there is not already an duplicate clearance open for this procedure. - Under Visit Info/Reason for Call, type in Other and utilize the format Clearance MM/DD/YY or Clearance TBD. Do not use dashes or single digits. - If request is for dental extraction, please clarify the # of teeth to be extracted.  Request for surgical clearance:  1. What type of surgery is being performed? Trans ureteral resection of bladder tumor   2. When is this surgery scheduled? TBD  3. What type of clearance is required (medical clearance vs. Pharmacy clearance to hold med vs. Both)? Both  4. Are there any medications that need to be held prior to surgery and how long? Hold eliquis 3 days prior  5. Practice name and name of physician performing surgery? [facility], Dr. Festus Aloe  6. What is the office phone number? [phone]   7.   What is the office fax number? [phone]  8.   Anesthesia type (None, local, MAC, general) ? general   Curtis Dunn 10/18/2020, 9:18 AM  _________________________________________________________________   (provider comments below)

## 2020-10-18 NOTE — Patient Outreach (Signed)
  [facility]) Care Management Chronic Special Needs Program    10/18/2020  Name: Curtis Dunn, DOB: Oct 27, 1941  MRN: [id number]  [employer] care management team has assumed care and services for this member. Case Closed by [facility] care management.   Quinn Plowman RN,BSN,CCM [facility] Management [phone]

## 2020-10-22 NOTE — Telephone Encounter (Signed)
   Primary Cardiologist: [name], MD  Chart reviewed as part of pre-operative protocol coverage. Patient was last seen by Dr. Marlou Porch in 06/2020. Patient was contacted today for further pre-op evaluation and reported doing well since last visit. No chest pain, shortness of breath, orthopnea, PND, palpitations, lightheadedness, dizziness, syncope. Able to complete >4.0 METS without any problems. Given past medical history and time since last visit, based on ACC/AHA guidelines, Curtis Dunn would be at acceptable risk for the planned procedure without further cardiovascular testing.   Patient is considered high risk for holding Eliquis for 3 days given history of stroke. Per Dr. Marlou Porch, he will need Lovenox bridge peri-operatively. Patient has been instructed to call our office when procedure has been scheduled that way Pharmacy can help arrange Lovenox bridge.  I will route this recommendation to the requesting party via Epic fax function and remove from pre-op pool.  Please call with questions.  Darreld Mclean, PA-C 10/22/2020, 3:02 PM

## 2020-10-23 DIAGNOSIS — Z7689 Persons encountering health services in other specified circumstances: Secondary | ICD-10-CM | POA: Diagnosis not present

## 2020-10-23 DIAGNOSIS — I129 Hypertensive chronic kidney disease with stage 1 through stage 4 chronic kidney disease, or unspecified chronic kidney disease: Secondary | ICD-10-CM | POA: Diagnosis not present

## 2020-10-23 DIAGNOSIS — E785 Hyperlipidemia, unspecified: Secondary | ICD-10-CM | POA: Diagnosis not present

## 2020-10-23 DIAGNOSIS — I480 Paroxysmal atrial fibrillation: Secondary | ICD-10-CM | POA: Diagnosis not present

## 2020-10-23 DIAGNOSIS — F419 Anxiety disorder, unspecified: Secondary | ICD-10-CM | POA: Diagnosis not present

## 2020-10-23 DIAGNOSIS — E039 Hypothyroidism, unspecified: Secondary | ICD-10-CM | POA: Diagnosis not present

## 2020-10-23 DIAGNOSIS — N1832 Chronic kidney disease, stage 3b: Secondary | ICD-10-CM | POA: Diagnosis not present

## 2020-10-23 DIAGNOSIS — K8020 Calculus of gallbladder without cholecystitis without obstruction: Secondary | ICD-10-CM | POA: Diagnosis not present

## 2020-10-23 DIAGNOSIS — C180 Malignant neoplasm of cecum: Secondary | ICD-10-CM | POA: Diagnosis not present

## 2020-10-23 DIAGNOSIS — I700 Atherosclerosis of aorta: Secondary | ICD-10-CM | POA: Diagnosis not present

## 2020-10-23 DIAGNOSIS — I739 Peripheral vascular disease, unspecified: Secondary | ICD-10-CM | POA: Diagnosis not present

## 2020-10-23 DIAGNOSIS — I6340 Cerebral infarction due to embolism of unspecified cerebral artery: Secondary | ICD-10-CM | POA: Diagnosis not present

## 2020-10-23 DIAGNOSIS — E1029 Type 1 diabetes mellitus with other diabetic kidney complication: Secondary | ICD-10-CM | POA: Diagnosis not present

## 2020-10-24 ENCOUNTER — Other Ambulatory Visit: Payer: Self-pay | Admitting: Urology

## 2020-10-24 ENCOUNTER — Telehealth: Payer: Self-pay | Admitting: Pharmacist

## 2020-10-24 MED ORDER — GEMCITABINE CHEMO FOR BLADDER INSTILLATION 2000 MG: 2000.0000 mg | Freq: Once | INTRAVENOUS | Status: AC

## 2020-10-24 NOTE — Telephone Encounter (Signed)
Spoke with Juliann Pulse at [facility] urology. Procedure will be on Dec 17 @ 9:30AM Apt on dec 10 is just a CT Pt scheduled to see pharmD on 12/10 @ 3:30 to review bridge

## 2020-10-24 NOTE — Telephone Encounter (Signed)
Patient called and left a VM that his apt with urology was on Friday 12/10 @ 8:45. We had asked him to let us know when his trans ureal resection of bladder tumor was going to be. He states that [facility] for urology told him he does not need to hold his Eliquis like we told him.  I suspect that the apt this Friday is not for the trans ureal resection of bladder tumor but for a pre-opt apt. I have called Sharee Pimple @ [phone] ext [phone] and left her a VM to clarify.

## 2020-10-24 NOTE — Telephone Encounter (Signed)
Urology office called stating that patient just told them that he takes ASA 81mg - they would like him to hold for 5 days.  Dr. Marlou Porch is it ok for him to hold ASA 5 days?

## 2020-10-25 NOTE — Telephone Encounter (Signed)
Per Dr. Marlou Porch, Quintana to hold Aspirin for 5 days prior to trans ureteral resection of bladder tumor as well. Will route this recommendation to requesting office via Epic fax function.  Darreld Mclean, PA-C 10/25/2020 8:07 AM

## 2020-10-25 NOTE — Telephone Encounter (Signed)
OK to hold ASA for 5 days prior to procedure.  [name], MD

## 2020-10-26 ENCOUNTER — Ambulatory Visit (INDEPENDENT_AMBULATORY_CARE_PROVIDER_SITE_OTHER): Payer: HMO | Admitting: Pharmacist

## 2020-10-26 ENCOUNTER — Other Ambulatory Visit: Payer: Self-pay

## 2020-10-26 VITALS — Wt 134.2 lb

## 2020-10-26 DIAGNOSIS — N1330 Unspecified hydronephrosis: Secondary | ICD-10-CM | POA: Diagnosis not present

## 2020-10-26 DIAGNOSIS — K8020 Calculus of gallbladder without cholecystitis without obstruction: Secondary | ICD-10-CM | POA: Diagnosis not present

## 2020-10-26 DIAGNOSIS — R310 Gross hematuria: Secondary | ICD-10-CM | POA: Diagnosis not present

## 2020-10-26 DIAGNOSIS — I639 Cerebral infarction, unspecified: Secondary | ICD-10-CM | POA: Diagnosis not present

## 2020-10-26 DIAGNOSIS — Z7901 Long term (current) use of anticoagulants: Secondary | ICD-10-CM

## 2020-10-26 DIAGNOSIS — R634 Abnormal weight loss: Secondary | ICD-10-CM | POA: Diagnosis not present

## 2020-10-26 MED ORDER — ENOXAPARIN SODIUM 100 MG/ML ~~LOC~~ SOLN: 100.0000 mg | SUBCUTANEOUS | 0 refills | Status: DC

## 2020-10-26 NOTE — Progress Notes (Signed)
Patient presents for instructions for lovenox bridging instructions  Scr 1.64 Weight 60 kg Crcl=31 ml/min Enoxaparin 100mg  q 24 hr  12/13 Take AM Eliquis, No PM Eliquis. Inject Lovenox into the belly @ 9:00PM. Rotate injection sites  12/14 No Eliquis, Inject Lovenox into the belly @ 9:00PM, Rotate injection sites  12/15 No Eliquis, Inject Lovenox into the belly @ 9:00PM, Rotate injection sites  12/16 No Eliquis, No lovenox  12/17 Procedure day- No Eliquis, No lovenox  Resume Eliquis as soon as deemed safe by Dr. Junious Silk  Provided demonstration on how to use loveonx- advised patient not to flick bubble out and to rotate injection sites.

## 2020-10-26 NOTE — Patient Instructions (Addendum)
12/13: Take AM Eliquis, No PM Eliquis. Inject Lovenox into the belly @ 9:00PM. Rotate injection sites  12/14: No Eliquis, Inject Lovenox into the belly @ 9:00PM, Rotate injection sites  12/15: No Eliquis, Inject Lovenox into the belly @ 9:00PM, Rotate injection sites  12/16: No Eliquis, No lovenox  12/17: Procedure day- No Eliquis, No lovenox  Resume Eliquis as soon as deemed safe by Dr. Junious Silk  Call us with any questions at [phone]

## 2020-10-26 NOTE — Progress Notes (Signed)
DUE TO COVID-19 ONLY ONE VISITOR IS ALLOWED TO COME WITH YOU AND STAY IN THE WAITING ROOM ONLY DURING PRE OP AND PROCEDURE DAY OF SURGERY. THE 1 VISITOR  MAY VISIT WITH YOU AFTER SURGERY IN YOUR PRIVATE ROOM DURING VISITING HOURS ONLY!  YOU NEED TO HAVE A COVID 19 TEST ON_12.14.2021 ______ @_______ , THIS TEST MUST BE DONE BEFORE SURGERY,  COVID TESTING SITE [address] [place] [address], IT IS ON THE RIGHT GOING OUT [address] APPROXIMATELY  2 MINUTES PAST [employer] ON THE RIGHT. ONCE YOUR COVID TEST IS COMPLETED,  PLEASE BEGIN THE QUARANTINE INSTRUCTIONS AS OUTLINED IN YOUR HANDOUT.                Curtis Dunn  10/26/2020   Your procedure is scheduled on: 11/02/2020    Report to [facility] Main  Entrance   Report to admitting at  0730    AM     Call this number if you have problems the morning of surgery [phone]    Remember: Do not eat food , candy gum or mints :After Midnight. You may have clear liquids from midnight until 0630am     CLEAR LIQUID DIET   Foods Allowed                                                                       Coffee and tea, regular and decaf                              Plain Jell-O any favor except red or purple                                            Fruit ices (not with fruit pulp)                                      Iced Popsicles                                     Carbonated beverages, regular and diet                                    Cranberry, grape and apple juices Sports drinks like Gatorade Lightly seasoned clear broth or consume(fat free) Sugar, honey syrup   _____________________________________________________________________    BRUSH YOUR TEETH MORNING OF SURGERY AND RINSE YOUR MOUTH OUT, NO CHEWING GUM CANDY OR MINTS.     Take these medicines the morning of surgery with A SIP OF WATER:   Toprol, allopurinol, amlodipine, Hydralazine  DO NOT TAKE ANY DIABETIC MEDICATIONS  DAY OF YOUR SURGERY                               You may not  have any metal on your body including hair pins and              piercings  Do not wear jewelry, make-up, lotions, powders or perfumes, deodorant             Do not wear nail polish on your fingernails.  Do not shave  48 hours prior to surgery.              Men may shave face and neck.   Do not bring valuables to the hospital. [place].  Contacts, dentures or bridgework may not be worn into surgery.  Leave suitcase in the car. After surgery it may be brought to your room.     Patients discharged the day of surgery will not be allowed to drive home. IF YOU ARE HAVING SURGERY AND GOING HOME THE SAME DAY, YOU MUST HAVE AN ADULT TO DRIVE YOU HOME AND BE WITH YOU FOR 24 HOURS. YOU MAY GO HOME BY TAXI OR UBER OR ORTHERWISE, BUT AN ADULT MUST ACCOMPANY YOU HOME AND STAY WITH YOU FOR 24 HOURS.  Name and phone number of your driver:  Special Instructions: N/A              Please read over the following fact sheets you were given: _____________________________________________________________________  [facility] - Preparing for Surgery Before surgery, you can play an important role.  Because skin is not sterile, your skin needs to be as free of germs as possible.  You can reduce the number of germs on your skin by washing with CHG (chlorahexidine gluconate) soap before surgery.  CHG is an antiseptic cleaner which kills germs and bonds with the skin to continue killing germs even after washing. Please DO NOT use if you have an allergy to CHG or antibacterial soaps.  If your skin becomes reddened/irritated stop using the CHG and inform your nurse when you arrive at Short Stay. Do not shave (including legs and underarms) for at least 48 hours prior to the first CHG shower.  You may shave your face/neck. Please follow these instructions carefully:  1.  Shower with CHG Soap the night before  surgery and the  morning of Surgery.  2.  If you choose to wash your hair, wash your hair first as usual with your  normal  shampoo.  3.  After you shampoo, rinse your hair and body thoroughly to remove the  shampoo.                           4.  Use CHG as you would any other liquid soap.  You can apply chg directly  to the skin and wash                       Gently with a scrungie or clean washcloth.  5.  Apply the CHG Soap to your body ONLY FROM THE NECK DOWN.   Do not use on face/ open                           Wound or open sores. Avoid contact with eyes, ears mouth and genitals (private parts).  Wash face,  Genitals (private parts) with your normal soap.             6.  Wash thoroughly, paying special attention to the area where your surgery  will be performed.  7.  Thoroughly rinse your body with warm water from the neck down.  8.  DO NOT shower/wash with your normal soap after using and rinsing off  the CHG Soap.                9.  Pat yourself dry with a clean towel.            10.  Wear clean pajamas.            11.  Place clean sheets on your bed the night of your first shower and do not  sleep with pets. Day of Surgery : Do not apply any lotions/deodorants the morning of surgery.  Please wear clean clothes to the hospital/surgery center.  FAILURE TO FOLLOW THESE INSTRUCTIONS MAY RESULT IN THE CANCELLATION OF YOUR SURGERY PATIENT SIGNATURE_________________________________  NURSE SIGNATURE__________________________________  ________________________________________________________________________

## 2020-10-29 ENCOUNTER — Encounter (HOSPITAL_COMMUNITY): Payer: Self-pay

## 2020-10-29 ENCOUNTER — Other Ambulatory Visit: Payer: Self-pay

## 2020-10-29 ENCOUNTER — Encounter (HOSPITAL_COMMUNITY)
Admission: RE | Admit: 2020-10-29 | Discharge: 2020-10-29 | Disposition: A | Discharge status: Home / Self Care | Payer: HMO | Source: Ambulatory Visit | Attending: Urology | Admitting: Urology

## 2020-10-29 DIAGNOSIS — Z01812 Encounter for preprocedural laboratory examination: Secondary | ICD-10-CM | POA: Diagnosis not present

## 2020-10-29 DIAGNOSIS — Z794 Long term (current) use of insulin: Secondary | ICD-10-CM | POA: Diagnosis not present

## 2020-10-29 DIAGNOSIS — D414 Neoplasm of uncertain behavior of bladder: Secondary | ICD-10-CM | POA: Insufficient documentation

## 2020-10-29 LAB — BASIC METABOLIC PANEL
Anion gap: 9 (ref 5–15)
BUN: 21 mg/dL (ref 8–23)
CO2: 28 mmol/L (ref 22–32)
Calcium: 10.6 mg/dL — ABNORMAL HIGH (ref 8.9–10.3)
Chloride: 103 mmol/L (ref 98–111)
Creatinine, Ser: 1.63 mg/dL — ABNORMAL HIGH (ref 0.61–1.24)
GFR, Estimated: 43 mL/min — ABNORMAL LOW (ref >60)
Glucose, Bld: 49 mg/dL — ABNORMAL LOW (ref 70–99)
Potassium: 5.3 mmol/L — ABNORMAL HIGH (ref 3.5–5.1)
Sodium: 140 mmol/L (ref 135–145)

## 2020-10-29 LAB — CBC
HCT: 37.4 % — ABNORMAL LOW (ref 39.0–52.0)
Hemoglobin: 12.1 g/dL — ABNORMAL LOW (ref 13.0–17.0)
MCH: 30.9 pg (ref 26.0–34.0)
MCHC: 32.4 g/dL (ref 30.0–36.0)
MCV: 95.7 fL (ref 80.0–100.0)
Platelets: 334 10*3/uL (ref 150–400)
RBC: 3.91 MIL/uL — ABNORMAL LOW (ref 4.22–5.81)
RDW: 13 % (ref 11.5–15.5)
WBC: 10.2 10*3/uL (ref 4.0–10.5)
nRBC: 0 % (ref 0.0–0.2)

## 2020-10-29 LAB — GLUCOSE, CAPILLARY
Glucose-Capillary: 43 mg/dL — CL (ref 70–99)
Glucose-Capillary: 73 mg/dL (ref 70–99)
Glucose-Capillary: 102 mg/dL — ABNORMAL HIGH (ref 70–99)

## 2020-10-29 NOTE — Psychosocial Assessment (Signed)
Spoke with pt by phone and pt states he feels fine and arrive home without problems.  Pt very appreciative of care he was given this pm.

## 2020-10-29 NOTE — Progress Notes (Addendum)
Anesthesia Review:  PCP: DR Reynold Bowen LOV 10/23/2020 on chart with hgba1c result of 6.5.   Cardiologist : DR Marlou Porch  07/02/2020 - LOV with Kathrene Alu.   Chest x-ray : EKG :11/15/2019  Echo :11/15/2019  Stress test: Cardiac Cath :  Activity level: can do a flight of stairs without difficulty  Sleep Study/ CPAP : Fasting Blood Sugar :      / Checks Blood Sugar -- times a day:  Pt has freestyle meter.   Blood Thinner/ Instructions /Last Dose: ASA / Instructions/ Last Dose :  Pt in today for preop appt.  At 1500pm-  cbgglucose was 43.  Freestyle reading was 40.   Patient states he feels fine.  Cbc and bmp to lab.  Pt given 6 peanut butter crackers and 8 ounces of Caffeine free Coke.  Pt tolerated well.  Pt contintues to voice no complaints. Roanna Banning made aware of glucose reading.   Pt states at lunch today he ate 1/2 can of tuna with crackers and diet green tea.  Patient stated his freestyle reading at home at lunch was 150 and covered with sliding scale.  Put to bathroom to void.   At 1520pm cbg was 73.   Roanna Banning made aware.  Pt drank another 8 ounce of Coke.  Tolerated well, Voiced no complaints  At 1530 pm  cbg glucose reading was 102.  Patient states he feels fine.  Made Konrad Felix, PAC pt sates he feels find and glucose reading of 102.   Pt discharged home per order of jessica Zanetto,PAC.   Patient drives himseff.  Walked with pt to main lobby with pt voiceing no complaints.  Pt aware he is to drive straight home and voiced understanding.    LVMM at 1555 in regards to above.   For [place] ,[place] with DR [place].  Left call back phone number of [phone].   Estill Cotta aware on voice mail that there wree discrepancies in glucose readings on cbg machine at hospital and  His freestyle machine.   Called and requested most recent office visit note with DR [place] and most recent HGBA1c result.   BMP done 10/29/20 routed via epic to Dr Junious Silk.  Last dose of  Eliquis on 10/29/20 per pt.

## 2020-10-30 ENCOUNTER — Other Ambulatory Visit (HOSPITAL_COMMUNITY)
Admission: RE | Admit: 2020-10-30 | Discharge: 2020-10-30 | Disposition: A | Discharge status: Home / Self Care | Payer: HMO | Source: Ambulatory Visit | Attending: Urology | Admitting: Urology

## 2020-10-30 DIAGNOSIS — Z20822 Contact with and (suspected) exposure to covid-19: Secondary | ICD-10-CM | POA: Insufficient documentation

## 2020-10-30 DIAGNOSIS — Z01812 Encounter for preprocedural laboratory examination: Secondary | ICD-10-CM | POA: Diagnosis not present

## 2020-10-30 DIAGNOSIS — H02834 Dermatochalasis of left upper eyelid: Secondary | ICD-10-CM | POA: Diagnosis not present

## 2020-10-30 DIAGNOSIS — H02831 Dermatochalasis of right upper eyelid: Secondary | ICD-10-CM | POA: Diagnosis not present

## 2020-10-30 DIAGNOSIS — H40013 Open angle with borderline findings, low risk, bilateral: Secondary | ICD-10-CM | POA: Diagnosis not present

## 2020-10-30 DIAGNOSIS — H524 Presbyopia: Secondary | ICD-10-CM | POA: Diagnosis not present

## 2020-10-30 DIAGNOSIS — Z961 Presence of intraocular lens: Secondary | ICD-10-CM | POA: Diagnosis not present

## 2020-10-30 LAB — SARS CORONAVIRUS 2 (TAT 6-24 HRS): SARS Coronavirus 2: NEGATIVE

## 2020-11-01 NOTE — Progress Notes (Addendum)
Called pt and reviewed with pt preop instructions for surgery on 11/02/2020.  Pt aware to arrive at 0530 am for surgery with surgery time of 0730am.  Pt voiced understanding of eating hs snack before bedtime tonite.  Pt plans to eat graham crackers and peanut butter.  Pt voiced understanding of clear liquids unitl 0430am on 11/02/2020.   Pt voiced understanding of Lantus Insulin to take 80% of pm dose of Lantus Insuilin on 11/01/2020 with 80% being 9 units since pt reports taking 12 units of Lantus Insuilin in the pm.  Patient aware to take no bedtime dose of Novolog on 11/01/2020.   Pt reports Lovenox as being completed on 10/31/2020.  Patient also voiced understanding of taking toprol. Allopurinol, hydralazine  And amlodipine as previous reviewed on preop instructoins given at preop appt.

## 2020-11-01 NOTE — H&P (Signed)
Office Visit Report     10/16/2020   --------------------------------------------------------------------------------   Curtis Filler. Dunn  MRN: [id number]  DOB: 07-Feb-1941, 79 year old Male  SSN: [id number]   PRIMARY CARE:  Annie Main A. [place], MD  REFERRING:  [name], MD  PROVIDER:  Festus Aloe, M.D.  LOCATION:  [facility] [phone]     --------------------------------------------------------------------------------   CC/HPI: F/u -   1) BPH with LUTS - his symptoms have been worse since 02-23-2019. The patient complains of recent onset or worsening of his lower urinary tract symptom(s) that include weak stream. Which is the most bothersome for him. He is on medications for symptoms of prostate enlargement. He was taking tamsulosin and saw palmetto. He denies straining, dysuria, incomplete emptying, urgency and dribbling.[name] He has not had UTIs in the last 12 months.   PSA was 2.55. This was drawn on 05/16/2019. Patient does not have a family history of prostate cancer. Last PSA was 2.2 on 05/16/2020.   He is concerned and frustrated by weak stream, but only gets up once or twice a night. He denies any incontinence. He tried alfuzosin. No change. Noc x 3. Prior PVR 0. He stopped alfuzosin and was rx'd solifenacin but didn't start it. C/o urgency.   Prostate [place] today with a 168 g prostate, but on cysto a large left papillary tumor at the bladder neck. He is on Eliquis with Dr. Marlou Porch.   Pt seen in management of the above.     ALLERGIES: No Allergies    MEDICATIONS: Tamsulosin Hcl 0.4 mg capsule  Eliquis 5 mg tablet  Humalog  Lantus 100 unit/ml vial Subcutaneous  Lovastatin 20 mg tablet Oral  Metoprolol Succinate 25 mg tablet, extended release 24 hr Oral  Ramipril 10 MG Oral Capsule Oral  Tolterodine Tartrate 2 mg tablet 1 tablet PO Q HS  Vitamin B-12 100 mcg tablet Oral  Vitamin C 1000 MG Oral Tablet Oral  Vitamin D3 TABS Oral  Vitamin E CAPS Oral  Zinc  CAPS Oral     GU PSH: No GU PSH      PSH Notes: Basal cell carcinoma removed- face 02-22-17,    NON-GU PSH: Appendectomy - 2007-02-23     GU PMH: BPH w/LUTS, I went over the anatomy of BPH and nature r/b of alpha blocker, 5ari, TURP, laser, Urolift and Rezum. Also disc nocturia and irritative symptoms can persist. DIsc OAB meds and he'll try a low dose. - 08/01/2020, - 2016-02-23 Weak Urinary Stream (Stable) - 08/01/2020, (Stable), - 10/28/2018, - 2017-02-22 Encounter for Prostate Cancer screening - 10/25/2019, - 10/28/2018, 02-22-17 ED due to arterial insufficiency - 23-Feb-2016, Erectile dysfunction due to arterial insufficiency, - 2015-02-23 Primary hypogonadism - 02-23-2016, Hypogonadism, testicular, - 23-Feb-2015 Bladder Diverticulum, Diverticulum, bladder acquired - 2015-02-23 BPH w/o LUTS, Benign enlargement of prostate - 2015-02-23 Bladder-neck stenosis/contracture, Bladder neck obstruction - 2013-02-22 Nocturia, Nocturia - 2013/02/22 Urinary Urgency, Urinary urgency - Feb 22, 2013      PMH Notes:  2012-06-07 15:20:21 - Note: Feelings Of Urinary Urgency Sudden  2007-07-14 10:46:18 - Note: Colon Cancer   NON-GU PMH: Encounter for general adult medical examination without abnormal findings, Encounter for preventive health examination - 2015/02/23 Muscle weakness (generalized), Muscle weakness - 02-22-14 Other lack of coordination, Other lack of coordination - February 22, 2013 Other muscle spasm, Muscle spasm - 2013-02-22 Type 1 diabetes mellitus without complications, Type I Diabetes Mellitus - 02/22/2013    FAMILY HISTORY: Death In The Family Father - Father Family Health  Status Number - Runs In Family Urinary Calculus - Father   SOCIAL HISTORY: Marital Status: Widowed Preferred Language: English; Ethnicity: Not Hispanic Or Latino; Race: White Current Smoking Status: Patient has never smoked.  Does not use smokeless tobacco. Has never drank.  Does not use drugs. Does not drink caffeine. Has not had a blood transfusion. Patient's occupation Automotive engineer.    REVIEW OF  SYSTEMS:    GU Review Male:   Patient denies leakage of urine, have to strain to urinate , burning/ pain with urination, penile pain, get up at night to urinate, erection problems, hard to postpone urination, frequent urination, trouble starting your stream, and stream starts and stops.  Gastrointestinal (Upper):   Patient denies nausea, vomiting, and indigestion/ heartburn.  Gastrointestinal (Lower):   Patient denies diarrhea and constipation.  Constitutional:   Patient denies fever, night sweats, weight loss, and fatigue.  Skin:   Patient denies skin rash/ lesion and itching.  Eyes:   Patient denies blurred vision and double vision.  Ears/ Nose/ Throat:   Patient denies sore throat and sinus problems.  Hematologic/Lymphatic:   Patient denies swollen glands and easy bruising.  Cardiovascular:   Patient denies leg swelling and chest pains.  Respiratory:   Patient denies cough and shortness of breath.  Endocrine:   Patient denies excessive thirst.  Musculoskeletal:   Patient denies back pain and joint pain.  Neurological:   Patient denies headaches and dizziness.  Psychologic:   Patient denies depression and anxiety.   VITAL SIGNS:      10/16/2020 09:27 AM  BP 124/71 mmHg  Heart Rate 63 /min  Temperature 99.1 F / 37.2 C   GU PHYSICAL EXAMINATION:    Scrotum: No lesions. No edema. No cysts. No warts.  Urethral Meatus: Normal size. No lesion, no wart, no discharge, no polyp. Normal location.  Penis: Circumcised, no warts, no cracks. No dorsal Peyronie's plaques, no left corporal Peyronie's plaques, no right corporal Peyronie's plaques, no scarring, no warts. No balanitis, no meatal stenosis.   MULTI-SYSTEM PHYSICAL EXAMINATION:    Constitutional: Well-nourished. No physical deformities. Normally developed. Good grooming.  Neck: Neck symmetrical, not swollen. Normal tracheal position.  Respiratory: No labored breathing, no use of accessory muscles.   Cardiovascular: Normal temperature,  normal extremity pulses, no swelling, no varicosities.  Skin: No paleness, no jaundice, no cyanosis. No lesion, no ulcer, no rash.  Neurologic / Psychiatric: Oriented to time, oriented to place, oriented to person. No depression, no anxiety, no agitation.  Gastrointestinal: No mass, no tenderness, no rigidity, non obese abdomen.     Complexity of Data:   06/22/14 06/09/13 01/28/12 11/28/10 10/08/09 09/21/08 09/18/06  PSA  Total PSA 2.07  1.41  1.52  1.18  1.89  1.54  1.19     06/21/14 12/06/10  Hormones  Testosterone, Total 526  509.61     PROCEDURES:         Flexible Cystoscopy - 52000  Risks, benefits, and some of the potential complications of the procedure were discussed with the patient. All questions were answered. Informed consent was obtained. Antibiotic prophylaxis was given -- Cephalexin. Sterile technique and intraurethral analgesia were used.  Meatus:  Normal size. Normal location. Normal condition.  Urethra:  No strictures.  External Sphincter:  Normal.  Verumontanum:  Normal.  Prostate:  Non-obstructing. No hyperplasia.  Bladder Neck:  Non-obstructing.  Ureteral Orifices:  Normal location. Normal size. Normal shape. Effluxed clear urine.  Bladder:  Mild trabeculation. A large ( 3 -  5 cm ) bladder tumor at left bladder neck       The lower urinary tract was carefully examined. The procedure was well-tolerated and without complications. Antibiotic instructions were given. Instructions were given to call the office immediately for bloody urine, difficulty urinating, painful urination, fever, chills, nausea, vomiting or other illness. The patient stated that he understood these instructions and would comply with them.          Prostate Ultrasound - 61443  Length:8.29 cm Height:7.18 cm Width:5.39 cm Volume:167.76 ml      The transrectal ultrasound probe is introduced into the rectum, and the prostate is visualized. Ultrasonography is utilized throughout the procedure.  At the conclusion of the procedure, the ultrasound probe is removed. The patient tolerates the procedure without complication.  Patient confirmed No Neulasta OnPro Device.      ASSESSMENT:      ICD-10 Details  1 GU:   Bladder tumor/neoplasm - D41.4 Acute, Uncomplicated - Discussed cysto finding. Needs CT hematuria protocol. Discussed the nature r/b/a to TURBT with post-op gemcitabine. Disc poss stent and foley.   2   BPH w/LUTS - N40.1 Chronic, Stable - Finding of bladder tumor calls into question that his LUTS are from BPH. Will need eradication of tumor and then reeval.   3   Urinary Urgency - R39.15 Chronic, Stable  4   Gross hematuria - X54.0 Acute, Uncomplicated - He voided bloody red urine but no clots and good stream after the cystoscopy. WIll assess as above.    PLAN:           Orders Labs BUN/Creatinine          Schedule X-Rays: 1 Week - C.T. Hematuria With and Without I.V. Contrast - 09/20/2020 bun 33, cr 1.64, gfr 42 -- resent today   Return Visit/Planned Activity: Next Available Appointment - Schedule Surgery          Document Letter(s):  Created for Patient: Clinical Summary         Notes:   cc: Dr. Forde Dandy     * Signed by Festus Aloe, M.D. on 10/17/20 at 4:43 PM (EST)*     The information contained in this medical record document is considered private and confidential patient information. This information can only be used for the medical diagnosis and/or medical services that are being provided by the patient's selected caregivers. This information can only be distributed outside of the patient's care if the patient agrees and signs waivers of authorization for this information to be sent to an outside source or route.  Addendum: CT scan of the abdomen and pelvis was obtained 10/26/2020 which revealed a locally advanced 5.6 cm left-sided bladder mass with left hydroureteronephrosis and likely involvement of the prostatic base, SV and left pelvic sidewall.   Fortunately, there was no obvious bone lesions nor lymphadenopathy.

## 2020-11-01 NOTE — Anesthesia Preprocedure Evaluation (Addendum)
Anesthesia Evaluation  Patient identified by MRN, date of birth, ID band Patient awake    Reviewed: Allergy & Precautions, NPO status , Patient's Chart, lab work & pertinent test results, reviewed documented beta blocker date and time   Airway Mallampati: II  TM Distance: >3 FB Neck ROM: Full    Dental  (+) Teeth Intact, Caps,    Pulmonary former smoker,    Pulmonary exam normal breath sounds clear to auscultation       Cardiovascular hypertension, Pt. on medications and Pt. on home beta blockers Normal cardiovascular exam+ dysrhythmias Atrial Fibrillation  Rhythm:Regular Rate:Normal     Neuro/Psych CVA, No Residual Symptoms negative psych ROS   GI/Hepatic negative GI ROS, Neg liver ROS, Colon cancer    Endo/Other  diabetes, Type 1, Insulin Dependent  Renal/GU Renal InsufficiencyRenal disease   Bladder cancer     Musculoskeletal  (+) Arthritis ,   Abdominal   Peds  Hematology  (+) Blood dyscrasia (Eliquis), anemia ,   Anesthesia Other Findings   Reproductive/Obstetrics                            Anesthesia Physical Anesthesia Plan  ASA: III  Anesthesia Plan: General   Post-op Pain Management:    Induction: Intravenous  PONV Risk Score and Plan: 3 and Dexamethasone and Ondansetron  Airway Management Planned: Oral ETT  Additional Equipment:   Intra-op Plan:   Post-operative Plan: Extubation in OR  Informed Consent: I have reviewed the patients History and Physical, chart, labs and discussed the procedure including the risks, benefits and alternatives for the proposed anesthesia with the patient or authorized representative who has indicated his/her understanding and acceptance.       Plan Discussed with: CRNA  Anesthesia Plan Comments:        Anesthesia Quick Evaluation

## 2020-11-01 NOTE — Progress Notes (Signed)
Pt aware of surgical time change for procedure scheduled on Friday 11/02/2020. Pt aware to arrive at [facility] admitting at 0530. No food after midnight clear liquids from midnight till 0430 then nothing by mouth.

## 2020-11-02 ENCOUNTER — Ambulatory Visit (HOSPITAL_COMMUNITY): Payer: HMO | Admitting: Anesthesiology

## 2020-11-02 ENCOUNTER — Ambulatory Visit (HOSPITAL_COMMUNITY): Payer: HMO | Admitting: Physician Assistant

## 2020-11-02 ENCOUNTER — Encounter (HOSPITAL_COMMUNITY)
Admission: RE | Disposition: A | Discharge status: Home / Self Care | Payer: Self-pay | Source: Home / Self Care | Attending: Urology

## 2020-11-02 ENCOUNTER — Encounter (HOSPITAL_COMMUNITY): Payer: Self-pay | Admitting: Urology

## 2020-11-02 ENCOUNTER — Ambulatory Visit (HOSPITAL_COMMUNITY)
Admission: RE | Admit: 2020-11-02 | Discharge: 2020-11-02 | Disposition: A | Discharge status: Home / Self Care | Payer: HMO | Attending: Urology | Admitting: Urology

## 2020-11-02 DIAGNOSIS — D494 Neoplasm of unspecified behavior of bladder: Secondary | ICD-10-CM | POA: Diagnosis not present

## 2020-11-02 DIAGNOSIS — N1330 Unspecified hydronephrosis: Secondary | ICD-10-CM | POA: Diagnosis not present

## 2020-11-02 DIAGNOSIS — I480 Paroxysmal atrial fibrillation: Secondary | ICD-10-CM | POA: Diagnosis not present

## 2020-11-02 DIAGNOSIS — R3912 Poor urinary stream: Secondary | ICD-10-CM | POA: Insufficient documentation

## 2020-11-02 DIAGNOSIS — R3915 Urgency of urination: Secondary | ICD-10-CM | POA: Diagnosis not present

## 2020-11-02 DIAGNOSIS — C679 Malignant neoplasm of bladder, unspecified: Secondary | ICD-10-CM | POA: Insufficient documentation

## 2020-11-02 DIAGNOSIS — D62 Acute posthemorrhagic anemia: Secondary | ICD-10-CM | POA: Diagnosis not present

## 2020-11-02 DIAGNOSIS — R310 Gross hematuria: Secondary | ICD-10-CM | POA: Diagnosis not present

## 2020-11-02 DIAGNOSIS — N401 Enlarged prostate with lower urinary tract symptoms: Secondary | ICD-10-CM | POA: Diagnosis not present

## 2020-11-02 DIAGNOSIS — D414 Neoplasm of uncertain behavior of bladder: Secondary | ICD-10-CM

## 2020-11-02 DIAGNOSIS — Z7901 Long term (current) use of anticoagulants: Secondary | ICD-10-CM | POA: Insufficient documentation

## 2020-11-02 LAB — GLUCOSE, CAPILLARY
Glucose-Capillary: 170 mg/dL — ABNORMAL HIGH (ref 70–99)
Glucose-Capillary: 207 mg/dL — ABNORMAL HIGH (ref 70–99)

## 2020-11-02 SURGERY — TURBT (TRANSURETHRAL RESECTION OF BLADDER TUMOR)
Anesthesia: General

## 2020-11-02 MED ORDER — FENTANYL CITRATE (PF) 100 MCG/2ML IJ SOLN: 25.0000 ug | INTRAMUSCULAR | Status: DC | PRN

## 2020-11-02 MED ORDER — FENTANYL CITRATE (PF) 250 MCG/5ML IJ SOLN
INTRAMUSCULAR | Status: DC | PRN
  Administered 2020-11-02 (×2): 25 ug via INTRAVENOUS
  Administered 2020-11-02: 100 ug via INTRAVENOUS

## 2020-11-02 MED ORDER — PHENYLEPHRINE 40 MCG/ML (10ML) SYRINGE FOR IV PUSH (FOR BLOOD PRESSURE SUPPORT)
PREFILLED_SYRINGE | INTRAVENOUS | Status: AC
  Filled 2020-11-02: qty 10

## 2020-11-02 MED ORDER — ONDANSETRON HCL 4 MG/2ML IJ SOLN
INTRAMUSCULAR | Status: DC | PRN
  Administered 2020-11-02: 4 mg via INTRAVENOUS

## 2020-11-02 MED ORDER — EPHEDRINE 5 MG/ML INJ
INTRAVENOUS | Status: AC
  Filled 2020-11-02: qty 10

## 2020-11-02 MED ORDER — SODIUM CHLORIDE 0.9 % IR SOLN
Status: DC | PRN
  Administered 2020-11-02 (×6): 3000 mL

## 2020-11-02 MED ORDER — LACTATED RINGERS IV SOLN: INTRAVENOUS | Status: DC

## 2020-11-02 MED ORDER — ORAL CARE MOUTH RINSE: 15.0000 mL | Freq: Once | OROMUCOSAL | Status: AC

## 2020-11-02 MED ORDER — ROCURONIUM BROMIDE 10 MG/ML (PF) SYRINGE
PREFILLED_SYRINGE | INTRAVENOUS | Status: DC | PRN
  Administered 2020-11-02: 50 mg via INTRAVENOUS
  Administered 2020-11-02: 10 mg via INTRAVENOUS

## 2020-11-02 MED ORDER — DEXAMETHASONE SODIUM PHOSPHATE 10 MG/ML IJ SOLN
INTRAMUSCULAR | Status: AC
  Filled 2020-11-02: qty 1

## 2020-11-02 MED ORDER — ACETAMINOPHEN 500 MG PO TABS
1000.0000 mg | ORAL_TABLET | Freq: Once | ORAL | Status: AC
  Administered 2020-11-02: 1000 mg via ORAL
  Filled 2020-11-02: qty 2

## 2020-11-02 MED ORDER — DEXAMETHASONE SODIUM PHOSPHATE 10 MG/ML IJ SOLN
INTRAMUSCULAR | Status: DC | PRN
  Administered 2020-11-02: 4 mg via INTRAVENOUS

## 2020-11-02 MED ORDER — LIDOCAINE HCL (PF) 2 % IJ SOLN
INTRAMUSCULAR | Status: AC
  Filled 2020-11-02: qty 5

## 2020-11-02 MED ORDER — PROPOFOL 10 MG/ML IV BOLUS
INTRAVENOUS | Status: AC
  Filled 2020-11-02: qty 40

## 2020-11-02 MED ORDER — INSULIN ASPART 100 UNIT/ML ~~LOC~~ SOLN
SUBCUTANEOUS | Status: AC
  Administered 2020-11-02: 2 [IU] via SUBCUTANEOUS
  Filled 2020-11-02: qty 1

## 2020-11-02 MED ORDER — ONDANSETRON HCL 4 MG/2ML IJ SOLN
INTRAMUSCULAR | Status: AC
  Filled 2020-11-02: qty 2

## 2020-11-02 MED ORDER — CEFAZOLIN SODIUM-DEXTROSE 2-4 GM/100ML-% IV SOLN
2.0000 g | Freq: Once | INTRAVENOUS | Status: AC
  Administered 2020-11-02: 2 g via INTRAVENOUS
  Filled 2020-11-02: qty 100

## 2020-11-02 MED ORDER — CHLORHEXIDINE GLUCONATE 0.12 % MT SOLN
15.0000 mL | Freq: Once | OROMUCOSAL | Status: AC
  Administered 2020-11-02: 15 mL via OROMUCOSAL

## 2020-11-02 MED ORDER — LIDOCAINE 2% (20 MG/ML) 5 ML SYRINGE
INTRAMUSCULAR | Status: DC | PRN
  Administered 2020-11-02: 80 mg via INTRAVENOUS

## 2020-11-02 MED ORDER — INSULIN ASPART 100 UNIT/ML ~~LOC~~ SOLN: 2.0000 [IU] | Freq: Once | SUBCUTANEOUS | Status: AC

## 2020-11-02 MED ORDER — EPHEDRINE SULFATE-NACL 50-0.9 MG/10ML-% IV SOSY
PREFILLED_SYRINGE | INTRAVENOUS | Status: DC | PRN
  Administered 2020-11-02 (×2): 5 mg via INTRAVENOUS

## 2020-11-02 MED ORDER — SUGAMMADEX SODIUM 200 MG/2ML IV SOLN
INTRAVENOUS | Status: DC | PRN
  Administered 2020-11-02: 120 mg via INTRAVENOUS

## 2020-11-02 MED ORDER — FENTANYL CITRATE (PF) 100 MCG/2ML IJ SOLN
INTRAMUSCULAR | Status: AC
  Filled 2020-11-02: qty 2

## 2020-11-02 MED ORDER — ROCURONIUM BROMIDE 10 MG/ML (PF) SYRINGE
PREFILLED_SYRINGE | INTRAVENOUS | Status: AC
  Filled 2020-11-02: qty 10

## 2020-11-02 MED ORDER — ONDANSETRON HCL 4 MG/2ML IJ SOLN: 4.0000 mg | Freq: Once | INTRAMUSCULAR | Status: DC | PRN

## 2020-11-02 MED ORDER — PHENYLEPHRINE HCL (PRESSORS) 10 MG/ML IV SOLN
INTRAVENOUS | Status: DC | PRN
  Administered 2020-11-02: 120 ug via INTRAVENOUS

## 2020-11-02 MED ORDER — APIXABAN 5 MG PO TABS: 5.0000 mg | ORAL_TABLET | Freq: Two times a day (BID) | ORAL | 11 refills | Status: AC

## 2020-11-02 MED ORDER — PHENYLEPHRINE 40 MCG/ML (10ML) SYRINGE FOR IV PUSH (FOR BLOOD PRESSURE SUPPORT)
PREFILLED_SYRINGE | INTRAVENOUS | Status: DC | PRN
  Administered 2020-11-02: 120 ug via INTRAVENOUS

## 2020-11-02 MED ORDER — PROPOFOL 10 MG/ML IV BOLUS
INTRAVENOUS | Status: DC | PRN
  Administered 2020-11-02: 150 mg via INTRAVENOUS

## 2020-11-02 SURGICAL SUPPLY — 13 items
BAG URINE DRAIN 2000ML AR STRL (UROLOGICAL SUPPLIES) IMPLANT
BAG URO CATCHER STRL LF (MISCELLANEOUS) ×3 IMPLANT
GLOVE BIOGEL M STRL SZ7.5 (GLOVE) ×3 IMPLANT
GOWN STRL REUS W/TWL XL LVL3 (GOWN DISPOSABLE) ×3 IMPLANT
GUIDEWIRE ANG ZIPWIRE 038X150 (WIRE) ×3 IMPLANT
KIT TURNOVER KIT A (KITS) IMPLANT
LOOP CUT BIPOLAR 24F LRG (ELECTROSURGICAL) IMPLANT
MANIFOLD NEPTUNE II (INSTRUMENTS) ×3 IMPLANT
PACK CYSTO (CUSTOM PROCEDURE TRAY) ×3 IMPLANT
PENCIL SMOKE EVACUATOR (MISCELLANEOUS) IMPLANT
TUBING CONNECTING 10 (TUBING) ×2 IMPLANT
TUBING CONNECTING 10' (TUBING) ×1
TUBING UROLOGY SET (TUBING) ×3 IMPLANT

## 2020-11-02 NOTE — Op Note (Addendum)
Preoperative diagnosis: Bladder neoplasm, left hydronephrosis Postoperative diagnosis: Same  Procedure: TURBT greater than 5 cm  Surgeon: Junious Silk  Anesthesia: General  Indication for procedure: Curtis Dunn is a 79 year old male who presented with lower urinary tract symptoms for evaluation for a prostate procedure.  He complained of frequency urgency hesitancy and weak stream.  On cystoscopy he had a large left-sided bladder mass.  Prostate was partially obstructive with some lateral lobe and a small median lobe.  He underwent CT scan which revealed a large 5 to 6 cm left-sided bladder mass which grew out to the left pelvic sidewall that was causing left hydroureteronephrosis.  There was no lymphadenopathy or bone lesions.  Findings: On exam under anesthesia the penis was circumcised without mass or lesion.  Right testicle is atrophic and high riding in the right scrotum.  Left testicle palpably normal and descended.  Scrotum normal.  On digital rectal exam the prostate was about 40 g and smooth without hard area or nodule but there was a large mass palpable by bimanual exam on the left side although it was mobile and not fixed.  On cystoscopy the urethra was unremarkable, prostate partially obstructive with lateral and median lobe.  Right ureteral orifice identified.  No stone or foreign body in the bladder.  Nodular large left sided bladder tumor that went from about 6:00 of the trigone up to about 2:00 on the left bladder wall and proceeded to stop right at the left bladder neck.  More posterior was more superficial reactive tumor.  About two thirds of the tumor was resected with the goal to control future bleeding and to get pathology.  Clinically this would be a T2 disease given that it is palpable and causing the left hydro but on CT T3.  Given his Eliquis use and cardiovascular disease he is going to be high risk for bleeding and may need to come back for further TUR also depending on  treatment course.  I will plan to add a limited left renal ultrasound on follow-up and if he still has hydronephrosis get him set up for left nephrostomy tube and potentially neoadjuvant chemo.  Description of procedure: After consent was obtained patient brought to the operating room.  After adequate anesthesia he was placed in lithotomy position and prepped and draped in the usual sterile fashion.  Timeout was performed to confirm the patient and procedure.  I did an exam under anesthesia.  After glove change the cystoscope was passed per urethra and the bladder inspected with 30 and 70 degree lens.  The scope was removed and the continuous flow sheath placed with the visual obturator.  Visual obturator exchanged for the loop and handle and I started at the 5 to 6 o'clock position on the trigone and resected to about 2:00 along the left sidewall and resected the main tumor body.  There was a small median lobe of the prostate that was interfering with scope passage into the bladder and visualizing the inferior extent of the tumor and this was minimally resected.  This might actually open the channel and help him void some.  There might be some prostate chips in the specimen.  The bladder tumor was obviously invasive into the muscle bundles.  Left ureteral orifice was not identified.  Some of the edges of reactive changes to the mucosa more posteriorly rebleeding and these were resected and cauterized.  At the end there was excellent hemostasis.  I felt like a catheter might cause more trauma to  the surface and I was not concerned about perforation.  He should have better bladder capacity and possibly control and therefore I did not leave a catheter.  We considered probing around with a wire to find the left ureteral orifice but again it was not readily visible and stent left in the bladder might cause trauma and bleeding to the resection site and some of the residual tumor.  If he were to consider treatment with  only TUR for bladder preservation we would need to perform a residual TUR.  With excellent hemostasis at low pressure the scope was removed after all the chips were evacuated.  There was some bleeding from the fossa navicularis as it was dilated with the continuous flow sheath but this settled down.  The patient was then awakened and taken to PACU in stable condition.  Complications: None  Blood loss: 50 ml  Specimens: Left bladder tumor to pathology  Drains: None  Disposition: Patient stable to PACU

## 2020-11-02 NOTE — Anesthesia Procedure Notes (Signed)
Procedure Name: Intubation Date/Time: 11/02/2020 7:31 AM Performed by: Eben Burow, CRNA Pre-anesthesia Checklist: Patient identified, Emergency Drugs available, Suction available, Patient being monitored and Timeout performed Patient Re-evaluated:Patient Re-evaluated prior to induction Oxygen Delivery Method: Circle system utilized Preoxygenation: Pre-oxygenation with 100% oxygen Induction Type: IV induction Ventilation: Mask ventilation without difficulty Laryngoscope Size: Mac and 4 Grade View: Grade I Tube type: Oral Number of attempts: 1 Airway Equipment and Method: Stylet Placement Confirmation: ETT inserted through vocal cords under direct vision,  positive ETCO2 and breath sounds checked- equal and bilateral Secured at: 23 cm Tube secured with: Tape Dental Injury: Teeth and Oropharynx as per pre-operative assessment

## 2020-11-02 NOTE — Discharge Instructions (Signed)
Transurethral Resection of Bladder Growth, Care After This sheet gives you information about how to care for yourself after your procedure. Your health care provider may also give you more specific instructions. If you have problems or questions, contact your health care provider. What can I expect after the procedure? After the procedure, it is common to have:  A small amount of blood in your urine for up to 2 weeks.  Soreness or mild pain from your catheter. After your catheter is removed, you may have mild soreness, especially when urinating.  Pain in your lower abdomen. Follow these instructions at home: Medicines   Take over-the-counter and prescription medicines only as told by your health care provider.  If you were prescribed an antibiotic medicine, take it as told by your health care provider. Do not stop taking the antibiotic even if you start to feel better.  Do not drive for 24 hours if you were given a sedative during your procedure.  Ask your health care provider if the medicine prescribed to you: ? Requires you to avoid driving or using heavy machinery. ? Can cause constipation. You may need to take these actions to prevent or treat constipation:  Take over-the-counter or prescription medicines.  Eat foods that are high in fiber, such as beans, whole grains, and fresh fruits and vegetables.  Limit foods that are high in fat and processed sugars, such as fried or sweet foods. Activity  Return to your normal activities as told by your health care provider. Ask your health care provider what activities are safe for you.  Do not lift anything that is heavier than 10 lb (4.5 kg), or the limit that you are told, until your health care provider says that it is safe.  Avoid intense physical activity for as long as told by your health care provider.  Rest as told by your health care provider.  Avoid sitting for a long time without moving. Get up to take short walks every  1-2 hours. This is important to improve blood flow and breathing. Ask for help if you feel weak or unsteady. General instructions   Do not drink alcohol for as long as told by your health care provider. This is especially important if you are taking prescription pain medicines.  Do not take baths, swim, or use a hot tub until your health care provider approves. Ask your health care provider if you may take showers. You may only be allowed to take sponge baths.  If you have a catheter, follow instructions from your health care provider about caring for your catheter and your drainage bag.  Drink enough fluid to keep your urine pale yellow.  Wear compression stockings as told by your health care provider. These stockings help to prevent blood clots and reduce swelling in your legs.  Keep all follow-up visits as told by your health care provider. This is important. ? You will need to be followed closely with regular checks of your bladder and urethra (cystoscopies) to make sure that the cancer does not come back. Contact a health care provider if:  You have pain that gets worse or does not improve with medicine.  You have blood in your urine for more than 2 weeks.  You have cloudy or bad-smelling urine.  You become constipated. Signs of constipation may include having: ? Fewer than three bowel movements in a week. ? Difficulty having a bowel movement. ? Stools that are dry, hard, or larger than normal.  You have  a fever. Get help right away if:  You have: ? Severe pain. ? Bright red blood in your urine. ? Blood clots in your urine. ? A lot of blood in your urine.  Your catheter has been removed and you are not able to urinate.  You have a catheter in place and the catheter is not draining urine. Summary  After your procedure, it is common to have a small amount of blood in your urine, soreness or mild pain from your catheter, and pain in your lower abdomen.  Take  over-the-counter and prescription medicines only as told by your health care provider.  Rest as told by your health care provider. Follow your health care provider's instructions about returning to normal activities. Ask what activities are safe for you.  If you have a catheter, follow instructions from your health care provider about caring for your catheter and your drainage bag.  Get help right away if you cannot urinate, you have severe pain, or you have bright red blood or blood clots in your urine. This information is not intended to replace advice given to you by your health care provider. Make sure you discuss any questions you have with your health care provider. Document Revised: 06/03/2018 Document Reviewed: 06/03/2018 Elsevier Patient Education  [place].

## 2020-11-02 NOTE — Interval H&P Note (Signed)
History and Physical Interval Note:  11/02/2020 7:17 AM  Curtis Dunn  has presented today for surgery, with the diagnosis of BLADER NEOPLASM.  The various methods of treatment have been discussed with the patient and family. After consideration of risks, benefits and other options for treatment, the patient has consented to  Procedure(s) with comments: TRANSURETHRAL RESECTION OF BLADDER TUMOR (TURBT)POST OPERATIVE INSTILLATION OF GEMCITABINE (N/A) - GENERAL W/ PARALYSIS as a surgical intervention.  The patient's history has been reviewed, patient examined, no change in status, stable for surgery.  I have reviewed the patient's chart and labs. Discussed CT with large bladder mass and left hydro but no bone lesion or LAD. Also need for foley, stent and possible left nx (in the outpt  future if stent not placed).  Questions were answered to the patient's satisfaction.  He elects to proceed. No dysuria, fever. Mild hematuria (light red - pink). C/o urge, UUI - might or might not improve.    Festus Aloe

## 2020-11-02 NOTE — Anesthesia Postprocedure Evaluation (Signed)
Anesthesia Post Note  Patient: GOTHAM RADEN  Procedure(s) Performed: TRANSURETHRAL RESECTION OF BLADDER TUMOR (TURBT) GREATER THAN 5CM (N/A )     Patient location during evaluation: PACU Anesthesia Type: General Level of consciousness: awake and alert, awake and oriented Pain management: pain level controlled Vital Signs Assessment: post-procedure vital signs reviewed and stable Respiratory status: spontaneous breathing, nonlabored ventilation and respiratory function stable Cardiovascular status: blood pressure returned to baseline and stable Postop Assessment: no apparent nausea or vomiting Anesthetic complications: no   No complications documented.  Last Vitals:  Vitals:   11/02/20 0930 11/02/20 0945  BP: (!) 142/68 (!) 155/69  Pulse: 64 (!) 55  Resp: 18 18  Temp: 36.6 C   SpO2: 100% 96%    Last Pain:  Vitals:   11/02/20 0950  TempSrc:   PainSc: 0-No pain                 Catalina Gravel

## 2020-11-02 NOTE — Transfer of Care (Signed)
Immediate Anesthesia Transfer of Care Note  Patient: MAJD TISSUE  Procedure(s) Performed: TRANSURETHRAL RESECTION OF BLADDER TUMOR (TURBT) GREATER THAN 5CM (N/A )  Patient Location: PACU  Anesthesia Type:General  Level of Consciousness: awake, alert  and patient cooperative  Airway & Oxygen Therapy: Patient Spontanous Breathing and Patient connected to face mask oxygen  Post-op Assessment: Report given to RN and Post -op Vital signs reviewed and stable  Post vital signs: Reviewed and stable  Last Vitals:  Vitals Value Taken Time  BP 141/66 11/02/20 0905  Temp    Pulse 59 11/02/20 0906  Resp 22 11/02/20 0906  SpO2 100 % 11/02/20 0906  Vitals shown include unvalidated device data.  Last Pain:  Vitals:   11/02/20 0558  TempSrc: Oral         Complications: No complications documented.

## 2020-11-03 ENCOUNTER — Encounter (HOSPITAL_COMMUNITY): Payer: Self-pay | Admitting: Urology

## 2020-11-05 LAB — SURGICAL PATHOLOGY

## 2020-11-21 ENCOUNTER — Ambulatory Visit: Payer: HMO | Admitting: Gastroenterology

## 2020-11-21 ENCOUNTER — Other Ambulatory Visit: Payer: HMO

## 2020-11-21 ENCOUNTER — Encounter: Payer: Self-pay | Admitting: Gastroenterology

## 2020-11-21 VITALS — BP 112/56 | HR 63 | Ht 71.0 in | Wt 129.8 lb

## 2020-11-21 DIAGNOSIS — R634 Abnormal weight loss: Secondary | ICD-10-CM | POA: Diagnosis not present

## 2020-11-21 DIAGNOSIS — R630 Anorexia: Secondary | ICD-10-CM

## 2020-11-21 NOTE — Patient Instructions (Addendum)
If you are age 80 or older, your body mass index should be between 23-30. Your Body mass index is 18.1 kg/m. If this is out of the aforementioned range listed, please consider follow up with your Primary Care Provider.  Your provider has requested that you go to the basement level for lab work before leaving today. Press "B" on the elevator. The lab is located at the first door on the left as you exit the elevator.  Due to recent changes in healthcare laws, you may see the results of your imaging and laboratory studies on MyChart before your provider has had a chance to review them.  We understand that in some cases there may be results that are confusing or concerning to you. Not all laboratory results come back in the same time frame and the provider may be waiting for multiple results in order to interpret others.  Please give [place] 48 hours in order for your provider to thoroughly review all the results before contacting the office for clarification of your results.   Increase Boost to 2 cans daily.  You will need to follow up in our office in 3 months (April 2022).  We will call you to schedule.  Thank you for entrusting me with your care and choosing [facility].  Dr Christella Hartigan

## 2020-11-21 NOTE — Progress Notes (Signed)
Review of pertinent gastrointestinal problems: 1.  Colon cancer, diagnosed 2001.  Status post right hemicolectomy.  Previously followed by Dr. Delfin Edis, her last colonoscopy for him was in 2015 and she found 3 subcentimeter tubular adenomas.  Colonoscopy 09/2020 Dr. Ardis Hughs found normal-appearing ileocolonic anastomosis, multiple diverticulum throughout the left colon.  The examination was otherwise normal. 2.  Evaluation for constipation 09/2020, underwent colonoscopy as noted above.  Eventually recommended to take 2 doses of MiraLAX every single day.  Blood work 09/2020 TSH was normal, complete metabolic profile showed a creatinine around is normal at 1.64, hemoglobin was 12.6, normal platelets.   HPI: This is a very pleasant 80 year old man. He recently had a TURBt with urology due to a high-grade infiltrative bladder cancer.  He is not having very satisfying bowel movements. He is not really constipated just having pellet-like stools of low volume. He has lost 40 pounds in the past 3 months.  He has had a slowly waning sense of taste for about 40 to 50 years now and recently thinks he does not have any taste sensation at all.   IMPRESSION CT scan abdomen pelvis with IV and oral contrast December 2021: 1. Locally advanced left-sided bladder primary, resulting in moderate left-sided hydroureteronephrosis and delayed contrast excretion from the left kidney. 2. Cannot exclude direct involvement of the left seminal vesicle or prostatic base. No abdominopelvic adenopathy. 3. Coronary artery atherosclerosis. Aortic Atherosclerosis (ICD10-I70.0). 4. 9 mm pancreatic cystic lesion is indeterminate. Pseudocyst versus less likely indolent cystic neoplasm. Of questionable clinical significance given comorbidities. This could either be re-evaluated at follow-up or if more dedicated imaging desired, follow-up pre and post contrast abdominal MRI performed 1 year. This recommendation follows ACR  consensus guidelines: Management of Incidental Pancreatic Cysts: A White Paper of the ACR Incidental Findings Committee. [place] [id number]. 5. Cholelithiasis. 6. Possible constipation.   ROS: complete GI ROS as described in HPI, all other review negative.  Constitutional:  No unintentional weight loss   Past Medical History:  Diagnosis Date  . Arthritis    left thumb  . Broken neck ([place])    in March 2015  . Cataract   . Colon cancer ([place])   . Colon polyp   . Diabetes mellitus    Type 1  since 1974  . Dysrhythmia    a fib  . Gout   . Hyperlipidemia   . Hypertension   . Lipoma of back   . Paroxysmal atrial fibrillation ([facility])   . Stroke [facility])    11/14/19  no defecit     Past Surgical History:  Procedure Laterality Date  . ANKLE SURGERY     right  . APPENDECTOMY  2001  . CATARACT EXTRACTION EXTRACAPSULAR Bilateral   . COLON RESECTION    . ELBOW FRACTURE SURGERY    . HERNIA REPAIR     umbilica and left inguinal  . INGUINAL HERNIA REPAIR Left 03/13/2016   Procedure: LEFT INGUINAL HERNIA REPAIR WITH UNBILICAL HERNIA REPAIR;  Surgeon: [name], MD;  Location: [place];  Service: General;  Laterality: Left;  . INSERTION OF MESH Left 03/13/2016   Procedure: INSERTION OF MESH;  Surgeon: [name], MD;  Location: [place];  Service: General;  Laterality: Left;  [name] MASS EXCISION N/A 07/20/2019   Procedure: EXCISION OF BACK MASS;  Surgeon: Rolm Bookbinder, MD;  Location: [place];  Service: General;  Laterality: N/A;  MAC AND LOCAL  . ORIF PATELLA Left 03/16/2020   Procedure:  [name];  Surgeon: Sydnee Cabal, MD;  Location: [facility];  Service: Orthopedics;  Laterality: Left;  femoral nerve block vs general  . SHOULDER SURGERY Right    right shoulder surgery  rmoval of tendon  . TRANSURETHRAL RESECTION OF BLADDER TUMOR N/A 11/02/2020   Procedure: TRANSURETHRAL RESECTION OF BLADDER TUMOR (TURBT) GREATER THAN 5CM;  Surgeon: Festus Aloe,  MD;  Location: [facility];  Service: Urology;  Laterality: N/A;  GENERAL W/ PARALYSIS  . VASECTOMY      Current Outpatient Medications  Medication Sig Dispense Refill  . allopurinol (ZYLOPRIM) 100 MG tablet Take 100 mg by mouth daily with breakfast.     . amLODipine (NORVASC) 5 MG tablet Take 1 tablet by mouth once daily (Patient taking differently: Take 5 mg by mouth daily.) 90 tablet 0  . apixaban (ELIQUIS) 5 MG TABS tablet Take 1 tablet (5 mg total) by mouth 2 (two) times daily. 60 tablet 11  . Ascorbic Acid (VITAMIN C) 1000 MG tablet Take 1,000 mg by mouth daily.    [name] aspirin EC 81 MG EC tablet Take 1 tablet (81 mg total) by mouth daily. 60 tablet 0  . Cholecalciferol (VITAMIN D3) 250 MCG (10000 UT) capsule Take 10,000 Units by mouth daily with breakfast.     . Coenzyme Q10 (COQ10 PO) Take 120 mg by mouth daily with breakfast.    . Continuous Blood Gluc Sensor (FREESTYLE LIBRE 14 DAY SENSOR) MISC USE TO MONITOR BLOOD GLUCOSE. CHANGE EVERY 14 DAYS.    [name] CRANBERRY PO Take 4,200 mg by mouth daily.    . Cyanocobalamin (VITAMIN B-12) 5000 MCG SUBL Take 5,000 mcg by mouth 2 (two) times a week.    . diphenhydramine-acetaminophen (TYLENOL PM) 25-500 MG TABS tablet Take 1 tablet by mouth at bedtime as needed (sleep).    . [place] 565 MG CAPS Take 565 mg by mouth daily with breakfast.    . hydrALAZINE (APRESOLINE) 50 MG tablet Take 1 tablet by mouth twice daily (Patient taking differently: Take 50 mg by mouth in the morning and at bedtime. Breakfast & supper) 180 tablet 3  . insulin aspart (NOVOLOG) 100 UNIT/ML injection Inject 5-12 Units into the skin 3 (three) times daily before meals. Inject 8-12 units at breakfast, 5-8 units at lunch, 6-8 units at supper, may inject 2-3.5 units at bedtime when needed for high blood sugar    . insulin glargine (LANTUS) 100 UNIT/ML injection Inject 0.12 mLs (12 Units total) into the skin 2 (two) times daily. Inject 12 units at breakfast and 10 units at bedtime (Patient  taking differently: Inject 12 Units into the skin See admin instructions. Inject 12 units at breakfast and 12 units at bedtime) 10 mL 11  . lovastatin (MEVACOR) 20 MG tablet Take 20 mg by mouth daily with breakfast.     . metoprolol succinate (TOPROL-XL) 25 MG 24 hr tablet Take 1 tablet by mouth once daily (Patient taking differently: Take 25 mg by mouth daily.) 90 tablet 3  . Omega 3-6-9 Fatty Acids (TRIPLE OMEGA-3-6-9 PO) Take 1 capsule by mouth 2 (two) times a week.    . ramipril (ALTACE) 10 MG capsule Take 1 capsule by mouth twice daily (Patient taking differently: Take 10 mg by mouth 2 (two) times daily.) 180 capsule 3  . tamsulosin (FLOMAX) 0.4 MG CAPS capsule Take 0.4 mg by mouth at bedtime.    . vitamin E 1000 UNIT capsule Take 1,000 Units by mouth every evening.     . Zinc 50  MG CAPS Take 50 mg by mouth daily with breakfast.      No current facility-administered medications for this visit.   Facility-Administered Medications Ordered in Other Visits  Medication Dose Route Frequency Provider Last Rate Last Admin  . gemcitabine (GEMZAR) chemo syringe for bladder instillation 2,000 mg  2,000 mg Bladder Instillation Once Festus Aloe, MD        Allergies as of 11/21/2020  . (No Known Allergies)    Family History  Problem Relation Age of Onset  . Heart disease Father   . Alzheimer's disease Mother   . Other Son        MVA, Covid 62  . Colon cancer Neg Hx   . Esophageal cancer Neg Hx   . Rectal cancer Neg Hx   . Stomach cancer Neg Hx     Social History   Socioeconomic History  . Marital status: Widowed    Spouse name: Not on file  . Number of children: 2  . Years of education: Not on file  . Highest education level: Not on file  Occupational History  . 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 Smoking status: Former Smoker    Types: Cigars  . Smokeless tobacco: Never Used  . Tobacco comment: occasional cigar  no longer    Vaping Use  . Vaping Use: Never used  Substance and Sexual Activity  . Alcohol use: Not Currently    Comment: rare  . Drug use: No  . Sexual activity: Not Currently  Other Topics Concern  . Not on file  Social History Narrative   Lives alone in 2 story [place] [address] [place]   Has one cat named Mousse   Has one daughter and one son. Daughter lives in [place]   Social Determinants of Health   Financial Resource Strain: Low Risk   . Difficulty of Paying Living Expenses: Not hard at all  Food Insecurity: No Food Insecurity  . Worried About Charity fundraiser in the Last Year: Never true  . Ran Out of Food in the Last Year: Never true  Transportation Needs: No Transportation Needs  . Lack of Transportation (Medical): No  . Lack of Transportation (Non-Medical): No  Physical Activity: Sufficiently Active  . Days of Exercise per Week: 3 days  . Minutes of Exercise per Session: 120 min  Stress: No Stress Concern Present  . Feeling of Stress : Only a little  Social Connections: Moderately Isolated  . Frequency of Communication with Friends and Family: More than three times a week  . Frequency of Social Gatherings with Friends and Family: More than three times a week  . Attends Religious Services: Never  . Active Member of Clubs or Organizations: Yes  . Attends Archivist Meetings: More than 4 times per year  . Marital Status: Widowed  Human resources officer Violence: Not on file     Physical Exam: BP (!) 112/56   Pulse 63   Ht _0  (1.803 m)   Wt 129 lb 12.8 oz (58.9 kg)   BMI 18.10 kg/m  Constitutional: generally well-appearing, quite thin Psychiatric: alert and oriented x3 Abdomen: soft, nontender, nondistended, no obvious ascites, no peritoneal signs, normal bowel sounds No peripheral edema noted in lower extremities  Assessment and plan: 80 y.o. male with anorexia, weight loss  He was recently diagnosed with a high-grade infiltrating  bladder cancer.  I think that it  is very possible that this is responsible for his 40 pound weight loss over the past 3 or 4 months.  He is meeting with his urologist tomorrow and plans to bring that up with him.  He is not having satisfying bowel movements I think because he is not having enough caloric intake.  He does have boost at home which she was recently advised to start drinking.  I recommended that he try to consume 2 boosts every single day.  I offered upper endoscopy for him to check for other potential causes of his anorexia and weight loss such as ulcer disease, cancer, H. pylori infection.  He declined.  He did however agree to H. pylori stool testing which will arrange for him.  He will also return to see me in 3 months.  If he is still losing weight at that point and we have no other ideas about what is going on maybe he will agree to an upper endoscopy then.  Please see the "Patient Instructions" section for addition details about the plan.  Owens Loffler, MD [facility] 11/21/2020, 9:15 AM   Total time on date of encounter was 26mnutes (this included time spent preparing to see the patient reviewing records; obtaining and/or reviewing separately obtained history; performing a medically appropriate exam and/or evaluation; counseling and educating the patient and family if present; ordering medications, tests or procedures if applicable; and documenting clinical information in the health record).

## 2020-11-22 DIAGNOSIS — R3915 Urgency of urination: Secondary | ICD-10-CM | POA: Diagnosis not present

## 2020-11-22 DIAGNOSIS — C672 Malignant neoplasm of lateral wall of bladder: Secondary | ICD-10-CM | POA: Diagnosis not present

## 2020-11-22 DIAGNOSIS — N130 Hydronephrosis with ureteropelvic junction obstruction: Secondary | ICD-10-CM | POA: Diagnosis not present

## 2020-11-23 LAB — H. PYLORI ANTIGEN, STOOL: H pylori Ag, Stl: NEGATIVE

## 2020-11-28 ENCOUNTER — Other Ambulatory Visit: Payer: Self-pay | Admitting: Cardiology

## 2020-11-29 ENCOUNTER — Telehealth: Payer: Self-pay | Admitting: Oncology

## 2020-11-29 ENCOUNTER — Ambulatory Visit: Payer: HMO | Admitting: Cardiology

## 2020-11-29 NOTE — Telephone Encounter (Signed)
Received a new pt referral from Dr. Junious Silk for bladder cancer. Mr. Brummet has been cld and scheduled to see Dr. Alen Blew on 1/19 at 11am. Pt aware to arrive 20 minutes early.

## 2020-12-04 ENCOUNTER — Encounter (HOSPITAL_COMMUNITY): Payer: Self-pay | Admitting: Internal Medicine

## 2020-12-04 ENCOUNTER — Emergency Department (HOSPITAL_COMMUNITY): Payer: HMO

## 2020-12-04 ENCOUNTER — Inpatient Hospital Stay (HOSPITAL_COMMUNITY)
Admission: EM | Admit: 2020-12-04 | Discharge: 2020-12-18 | DRG: 871 | Disposition: E | Payer: HMO | Attending: Internal Medicine | Admitting: Internal Medicine

## 2020-12-04 DIAGNOSIS — R739 Hyperglycemia, unspecified: Secondary | ICD-10-CM | POA: Diagnosis not present

## 2020-12-04 DIAGNOSIS — Z85038 Personal history of other malignant neoplasm of large intestine: Secondary | ICD-10-CM

## 2020-12-04 DIAGNOSIS — D631 Anemia in chronic kidney disease: Secondary | ICD-10-CM | POA: Diagnosis present

## 2020-12-04 DIAGNOSIS — C679 Malignant neoplasm of bladder, unspecified: Secondary | ICD-10-CM | POA: Diagnosis present

## 2020-12-04 DIAGNOSIS — I129 Hypertensive chronic kidney disease with stage 1 through stage 4 chronic kidney disease, or unspecified chronic kidney disease: Secondary | ICD-10-CM | POA: Diagnosis present

## 2020-12-04 DIAGNOSIS — E875 Hyperkalemia: Secondary | ICD-10-CM | POA: Diagnosis not present

## 2020-12-04 DIAGNOSIS — Z781 Physical restraint status: Secondary | ICD-10-CM

## 2020-12-04 DIAGNOSIS — Z7189 Other specified counseling: Secondary | ICD-10-CM | POA: Diagnosis not present

## 2020-12-04 DIAGNOSIS — E1110 Type 2 diabetes mellitus with ketoacidosis without coma: Secondary | ICD-10-CM | POA: Diagnosis present

## 2020-12-04 DIAGNOSIS — E1022 Type 1 diabetes mellitus with diabetic chronic kidney disease: Secondary | ICD-10-CM | POA: Diagnosis not present

## 2020-12-04 DIAGNOSIS — Z9049 Acquired absence of other specified parts of digestive tract: Secondary | ICD-10-CM

## 2020-12-04 DIAGNOSIS — G928 Other toxic encephalopathy: Secondary | ICD-10-CM | POA: Diagnosis present

## 2020-12-04 DIAGNOSIS — R410 Disorientation, unspecified: Secondary | ICD-10-CM | POA: Diagnosis not present

## 2020-12-04 DIAGNOSIS — R4182 Altered mental status, unspecified: Secondary | ICD-10-CM | POA: Diagnosis not present

## 2020-12-04 DIAGNOSIS — E871 Hypo-osmolality and hyponatremia: Secondary | ICD-10-CM | POA: Diagnosis present

## 2020-12-04 DIAGNOSIS — Z66 Do not resuscitate: Secondary | ICD-10-CM | POA: Diagnosis not present

## 2020-12-04 DIAGNOSIS — I482 Chronic atrial fibrillation, unspecified: Secondary | ICD-10-CM | POA: Diagnosis present

## 2020-12-04 DIAGNOSIS — R6520 Severe sepsis without septic shock: Secondary | ICD-10-CM | POA: Diagnosis not present

## 2020-12-04 DIAGNOSIS — B9620 Unspecified Escherichia coli [E. coli] as the cause of diseases classified elsewhere: Secondary | ICD-10-CM | POA: Diagnosis not present

## 2020-12-04 DIAGNOSIS — E861 Hypovolemia: Secondary | ICD-10-CM | POA: Diagnosis present

## 2020-12-04 DIAGNOSIS — E785 Hyperlipidemia, unspecified: Secondary | ICD-10-CM | POA: Diagnosis present

## 2020-12-04 DIAGNOSIS — Z79899 Other long term (current) drug therapy: Secondary | ICD-10-CM

## 2020-12-04 DIAGNOSIS — E872 Acidosis, unspecified: Secondary | ICD-10-CM | POA: Diagnosis present

## 2020-12-04 DIAGNOSIS — E1010 Type 1 diabetes mellitus with ketoacidosis without coma: Secondary | ICD-10-CM | POA: Diagnosis not present

## 2020-12-04 DIAGNOSIS — I10 Essential (primary) hypertension: Secondary | ICD-10-CM | POA: Diagnosis not present

## 2020-12-04 DIAGNOSIS — G9341 Metabolic encephalopathy: Secondary | ICD-10-CM | POA: Diagnosis present

## 2020-12-04 DIAGNOSIS — R451 Restlessness and agitation: Secondary | ICD-10-CM | POA: Diagnosis not present

## 2020-12-04 DIAGNOSIS — Z515 Encounter for palliative care: Secondary | ICD-10-CM

## 2020-12-04 DIAGNOSIS — W19XXXA Unspecified fall, initial encounter: Secondary | ICD-10-CM | POA: Diagnosis not present

## 2020-12-04 DIAGNOSIS — N390 Urinary tract infection, site not specified: Secondary | ICD-10-CM | POA: Diagnosis present

## 2020-12-04 DIAGNOSIS — Z789 Other specified health status: Secondary | ICD-10-CM | POA: Diagnosis not present

## 2020-12-04 DIAGNOSIS — R6251 Failure to thrive (child): Secondary | ICD-10-CM | POA: Diagnosis present

## 2020-12-04 DIAGNOSIS — A4151 Sepsis due to Escherichia coli [E. coli]: Secondary | ICD-10-CM | POA: Diagnosis not present

## 2020-12-04 DIAGNOSIS — E860 Dehydration: Secondary | ICD-10-CM | POA: Diagnosis present

## 2020-12-04 DIAGNOSIS — R748 Abnormal levels of other serum enzymes: Secondary | ICD-10-CM | POA: Diagnosis present

## 2020-12-04 DIAGNOSIS — Z8249 Family history of ischemic heart disease and other diseases of the circulatory system: Secondary | ICD-10-CM

## 2020-12-04 DIAGNOSIS — T383X6A Underdosing of insulin and oral hypoglycemic [antidiabetic] drugs, initial encounter: Secondary | ICD-10-CM | POA: Diagnosis not present

## 2020-12-04 DIAGNOSIS — Z043 Encounter for examination and observation following other accident: Secondary | ICD-10-CM | POA: Diagnosis not present

## 2020-12-04 DIAGNOSIS — Z20822 Contact with and (suspected) exposure to covid-19: Secondary | ICD-10-CM | POA: Diagnosis not present

## 2020-12-04 DIAGNOSIS — I4891 Unspecified atrial fibrillation: Secondary | ICD-10-CM | POA: Diagnosis present

## 2020-12-04 DIAGNOSIS — Z8673 Personal history of transient ischemic attack (TIA), and cerebral infarction without residual deficits: Secondary | ICD-10-CM

## 2020-12-04 DIAGNOSIS — Z794 Long term (current) use of insulin: Secondary | ICD-10-CM

## 2020-12-04 DIAGNOSIS — I480 Paroxysmal atrial fibrillation: Secondary | ICD-10-CM | POA: Diagnosis present

## 2020-12-04 DIAGNOSIS — S2231XA Fracture of one rib, right side, initial encounter for closed fracture: Secondary | ICD-10-CM | POA: Diagnosis not present

## 2020-12-04 DIAGNOSIS — R778 Other specified abnormalities of plasma proteins: Secondary | ICD-10-CM | POA: Diagnosis present

## 2020-12-04 DIAGNOSIS — Z8719 Personal history of other diseases of the digestive system: Secondary | ICD-10-CM

## 2020-12-04 DIAGNOSIS — E43 Unspecified severe protein-calorie malnutrition: Secondary | ICD-10-CM | POA: Diagnosis present

## 2020-12-04 DIAGNOSIS — Z681 Body mass index (BMI) 19 or less, adult: Secondary | ICD-10-CM | POA: Diagnosis not present

## 2020-12-04 DIAGNOSIS — Z791 Long term (current) use of non-steroidal anti-inflammatories (NSAID): Secondary | ICD-10-CM

## 2020-12-04 DIAGNOSIS — A419 Sepsis, unspecified organism: Secondary | ICD-10-CM | POA: Diagnosis present

## 2020-12-04 DIAGNOSIS — Z9114 Patient's other noncompliance with medication regimen: Secondary | ICD-10-CM

## 2020-12-04 DIAGNOSIS — Z820 Family history of epilepsy and other diseases of the nervous system: Secondary | ICD-10-CM

## 2020-12-04 DIAGNOSIS — N1832 Chronic kidney disease, stage 3b: Secondary | ICD-10-CM | POA: Diagnosis present

## 2020-12-04 DIAGNOSIS — Z7982 Long term (current) use of aspirin: Secondary | ICD-10-CM

## 2020-12-04 DIAGNOSIS — N179 Acute kidney failure, unspecified: Secondary | ICD-10-CM | POA: Diagnosis not present

## 2020-12-04 DIAGNOSIS — R627 Adult failure to thrive: Secondary | ICD-10-CM | POA: Diagnosis present

## 2020-12-04 DIAGNOSIS — Z87891 Personal history of nicotine dependence: Secondary | ICD-10-CM

## 2020-12-04 LAB — COMPREHENSIVE METABOLIC PANEL
ALT: 38 U/L (ref 0–44)
AST: 45 U/L — ABNORMAL HIGH (ref 15–41)
Albumin: 3.5 g/dL (ref 3.5–5.0)
Alkaline Phosphatase: 125 U/L (ref 38–126)
Anion gap: 31 — ABNORMAL HIGH (ref 5–15)
BUN: 80 mg/dL — ABNORMAL HIGH (ref 8–23)
CO2: 7 mmol/L — ABNORMAL LOW (ref 22–32)
Calcium: 11 mg/dL — ABNORMAL HIGH (ref 8.9–10.3)
Chloride: 98 mmol/L (ref 98–111)
Creatinine, Ser: 2.98 mg/dL — ABNORMAL HIGH (ref 0.61–1.24)
GFR, Estimated: 21 mL/min — ABNORMAL LOW (ref >60)
Glucose, Bld: 819 mg/dL (ref 70–99)
Potassium: 6.7 mmol/L (ref 3.5–5.1)
Sodium: 136 mmol/L (ref 135–145)
Total Bilirubin: 1.9 mg/dL — ABNORMAL HIGH (ref 0.3–1.2)
Total Protein: 6.9 g/dL (ref 6.5–8.1)

## 2020-12-04 LAB — I-STAT CHEM 8, ED
BUN: 78 mg/dL — ABNORMAL HIGH (ref 8–23)
Calcium, Ion: 1.41 mmol/L — ABNORMAL HIGH (ref 1.15–1.40)
Chloride: 106 mmol/L (ref 98–111)
Creatinine, Ser: 2.1 mg/dL — ABNORMAL HIGH (ref 0.61–1.24)
Glucose, Bld: >700 mg/dL (ref 70–99)
HCT: 37 % — ABNORMAL LOW (ref 39.0–52.0)
Hemoglobin: 12.6 g/dL — ABNORMAL LOW (ref 13.0–17.0)
Potassium: 6.6 mmol/L (ref 3.5–5.1)
Sodium: 134 mmol/L — ABNORMAL LOW (ref 135–145)
TCO2: 12 mmol/L — ABNORMAL LOW (ref 22–32)

## 2020-12-04 LAB — I-STAT VENOUS BLOOD GAS, ED
Acid-base deficit: 19 mmol/L — ABNORMAL HIGH (ref 0.0–2.0)
Bicarbonate: 8.9 mmol/L — ABNORMAL LOW (ref 20.0–28.0)
Calcium, Ion: 1.41 mmol/L — ABNORMAL HIGH (ref 1.15–1.40)
HCT: 36 % — ABNORMAL LOW (ref 39.0–52.0)
Hemoglobin: 12.2 g/dL — ABNORMAL LOW (ref 13.0–17.0)
O2 Saturation: 92 %
Potassium: 6.6 mmol/L (ref 3.5–5.1)
Sodium: 134 mmol/L — ABNORMAL LOW (ref 135–145)
TCO2: 10 mmol/L — ABNORMAL LOW (ref 22–32)
pCO2, Ven: 28.3 mmHg — ABNORMAL LOW (ref 44.0–60.0)
pH, Ven: 7.108 — CL (ref 7.250–7.430)
pO2, Ven: 85 mmHg — ABNORMAL HIGH (ref 32.0–45.0)

## 2020-12-04 LAB — CBC WITH DIFFERENTIAL/PLATELET
Abs Immature Granulocytes: 0.12 10*3/uL — ABNORMAL HIGH (ref 0.00–0.07)
Basophils Absolute: 0 10*3/uL (ref 0.0–0.1)
Basophils Relative: 0 %
Eosinophils Absolute: 0 10*3/uL (ref 0.0–0.5)
Eosinophils Relative: 0 %
HCT: 40.5 % (ref 39.0–52.0)
Hemoglobin: 11.8 g/dL — ABNORMAL LOW (ref 13.0–17.0)
Immature Granulocytes: 1 %
Lymphocytes Relative: 2 %
Lymphs Abs: 0.4 10*3/uL — ABNORMAL LOW (ref 0.7–4.0)
MCH: 30.4 pg (ref 26.0–34.0)
MCHC: 29.1 g/dL — ABNORMAL LOW (ref 30.0–36.0)
MCV: 104.4 fL — ABNORMAL HIGH (ref 80.0–100.0)
Monocytes Absolute: 0.8 10*3/uL (ref 0.1–1.0)
Monocytes Relative: 4 %
Neutro Abs: 17 10*3/uL — ABNORMAL HIGH (ref 1.7–7.7)
Neutrophils Relative %: 93 %
Platelets: 459 10*3/uL — ABNORMAL HIGH (ref 150–400)
RBC: 3.88 MIL/uL — ABNORMAL LOW (ref 4.22–5.81)
RDW: 13.7 % (ref 11.5–15.5)
WBC: 18.4 10*3/uL — ABNORMAL HIGH (ref 4.0–10.5)
nRBC: 0 % (ref 0.0–0.2)

## 2020-12-04 LAB — CBG MONITORING, ED
Glucose-Capillary: >600 mg/dL (ref 70–99)
Glucose-Capillary: >600 mg/dL (ref 70–99)

## 2020-12-04 LAB — PROTIME-INR
INR: 1.3 — ABNORMAL HIGH (ref 0.8–1.2)
Prothrombin Time: 15.4 seconds — ABNORMAL HIGH (ref 11.4–15.2)

## 2020-12-04 LAB — LACTIC ACID, PLASMA: Lactic Acid, Venous: 2.9 mmol/L (ref 0.5–1.9)

## 2020-12-04 LAB — CK: Total CK: 547 U/L — ABNORMAL HIGH (ref 49–397)

## 2020-12-04 LAB — APTT: aPTT: 28 seconds (ref 24–36)

## 2020-12-04 LAB — RESP PANEL BY RT-PCR (FLU A&B, COVID) ARPGX2
Influenza A by PCR: NEGATIVE
Influenza B by PCR: NEGATIVE
SARS Coronavirus 2 by RT PCR: NEGATIVE

## 2020-12-04 MED ORDER — DEXTROSE IN LACTATED RINGERS 5 % IV SOLN
INTRAVENOUS | Status: DC
  Administered 2020-12-06: 1 mL via INTRAVENOUS

## 2020-12-04 MED ORDER — DEXTROSE IN LACTATED RINGERS 5 % IV SOLN: INTRAVENOUS | Status: DC

## 2020-12-04 MED ORDER — INSULIN REGULAR(HUMAN) IN NACL 100-0.9 UT/100ML-% IV SOLN
INTRAVENOUS | Status: DC
  Administered 2020-12-04: 3.4 [IU]/h via INTRAVENOUS
  Filled 2020-12-04: qty 100

## 2020-12-04 MED ORDER — SODIUM CHLORIDE 0.9 % IV BOLUS
2000.0000 mL | Freq: Once | INTRAVENOUS | Status: AC
  Administered 2020-12-04: 1000 mL via INTRAVENOUS

## 2020-12-04 MED ORDER — DEXTROSE 50 % IV SOLN: 0.0000 mL | INTRAVENOUS | Status: DC | PRN

## 2020-12-04 MED ORDER — LACTATED RINGERS IV SOLN: INTRAVENOUS | Status: DC

## 2020-12-04 MED ORDER — SODIUM BICARBONATE 8.4 % IV SOLN
50.0000 meq | Freq: Once | INTRAVENOUS | Status: AC
  Administered 2020-12-04: 50 meq via INTRAVENOUS
  Filled 2020-12-04: qty 50

## 2020-12-04 MED ORDER — SODIUM CHLORIDE 0.9 % IV BOLUS (SEPSIS)
1000.0000 mL | Freq: Once | INTRAVENOUS | Status: AC
  Administered 2020-12-04: 1000 mL via INTRAVENOUS

## 2020-12-04 MED ORDER — INSULIN REGULAR(HUMAN) IN NACL 100-0.9 UT/100ML-% IV SOLN
INTRAVENOUS | Status: DC
  Administered 2020-12-04: 3.4 [IU]/h via INTRAVENOUS
  Administered 2020-12-05: 1.5 [IU]/h via INTRAVENOUS
  Administered 2020-12-05: 2.8 [IU]/h via INTRAVENOUS
  Filled 2020-12-04: qty 100

## 2020-12-04 NOTE — ED Notes (Signed)
Patient transported to CT 

## 2020-12-04 NOTE — ED Notes (Signed)
[facility] notified of pts critical labs.

## 2020-12-04 NOTE — H&P (Addendum)
History and Physical    GREOGORY CORNETTE IPJ:[id number] DOB: 1941-11-16 DOA: 11/21/2020  PCP: Reynold Bowen, MD  Patient coming from: Home.  History obtained from patient's daughter.  Patient is confused.  Chief Complaint: Confusion.  HPI: Curtis Dunn is a 80 y.o. male with history of atrial fibrillation and prior stroke in December 2020 who was recently diagnosed with bladder cancer underwent TURB last month with history of colon cancer status post hemicolectomy diabetes mellitus type 2 was not responding to patient's daughter's phone calls at home and patient's daughter called him family and checked on the patient was found to be on the bed confused.  Multiple pills were found on the floor.  Not sure if he was taking it for last few days.  Patient was brought to the ER.  As per the patient's daughter patient's mental status has been declining over the last 2 months has been getting more confused agitated and was not sure if he was getting dementia.  Patient also has a follow-up with oncologist next week for possible chemotherapy discussion.  ED Course: In the ER patient was confused and was in A. fib with RVR.  Labs were showing signs of DKA with blood sugar of 890 with bicarb of 7 anion gap of 31 creatinine increased from 1.6 and it is around 2.9 WBC count is 18.4 chest x-ray unremarkable UA is unremarkable.  Patient was started on fluid bolus and IV insulin for DKA.  CT head is unremarkable.  On my exam patient still confused.  For the A. fib with RVR I ordered 1 dose of Cardizem bolus 10 mg and Patient was getting IV metoprolol.  Review of Systems: As per HPI, rest all negative.   Past Medical History:  Diagnosis Date  . Arthritis    left thumb  . Broken neck ([place])    in March 2015  . Cataract   . Colon cancer ([place])   . Colon polyp   . Diabetes mellitus    Type 1  since 1974  . Dysrhythmia    a fib  . Gout   . Hyperlipidemia   . Hypertension   . Lipoma of back    . Paroxysmal atrial fibrillation ([facility])   . Stroke [facility])    11/14/19  no defecit     Past Surgical History:  Procedure Laterality Date  . ANKLE SURGERY     right  . APPENDECTOMY  2001  . CATARACT EXTRACTION EXTRACAPSULAR Bilateral   . COLON RESECTION    . ELBOW FRACTURE SURGERY    . HERNIA REPAIR     umbilica and left inguinal  . INGUINAL HERNIA REPAIR Left 03/13/2016   Procedure: LEFT INGUINAL HERNIA REPAIR WITH UNBILICAL HERNIA REPAIR;  Surgeon: [name], MD;  Location: [place];  Service: General;  Laterality: Left;  . INSERTION OF MESH Left 03/13/2016   Procedure: INSERTION OF MESH;  Surgeon: [name], MD;  Location: [place];  Service: General;  Laterality: Left;  [name] MASS EXCISION N/A 07/20/2019   Procedure: EXCISION OF BACK MASS;  Surgeon: Rolm Bookbinder, MD;  Location: [place];  Service: General;  Laterality: N/A;  MAC AND LOCAL  . ORIF PATELLA Left 03/16/2020   Procedure: [name];  Surgeon: Sydnee Cabal, MD;  Location: [facility];  Service: Orthopedics;  Laterality: Left;  femoral nerve block vs general  . SHOULDER SURGERY Right    right shoulder surgery  rmoval of tendon  . TRANSURETHRAL RESECTION OF BLADDER TUMOR  N/A 11/02/2020   Procedure: TRANSURETHRAL RESECTION OF BLADDER TUMOR (TURBT) GREATER THAN 5CM;  Surgeon: Festus Aloe, MD;  Location: [facility];  Service: Urology;  Laterality: N/A;  GENERAL W/ PARALYSIS  . VASECTOMY       reports that he has quit smoking. His smoking use included cigars. He has never used smokeless tobacco. He reports previous alcohol use. He reports that he does not use drugs.  No Known Allergies  Family History  Problem Relation Age of Onset  . Heart disease Father   . Alzheimer's disease Mother   . Other Son        MVA, Covid 75  . Colon cancer Neg Hx   . Esophageal cancer Neg Hx   . Rectal cancer Neg Hx   . Stomach cancer Neg Hx     Prior to Admission medications   Medication Sig Start Date End Date  Taking? Authorizing Provider  allopurinol (ZYLOPRIM) 100 MG tablet Take 100 mg by mouth daily with breakfast.  06/02/19   [provider]  amLODipine (NORVASC) 5 MG tablet Take 1 tablet (5 mg total) by mouth daily. Overdue follow up appt needed for further refills 1st attempt 11/28/20   Jerline Pain, MD  apixaban (ELIQUIS) 5 MG TABS tablet Take 1 tablet (5 mg total) by mouth 2 (two) times daily. 11/04/20   Festus Aloe, MD  Ascorbic Acid (VITAMIN C) 1000 MG tablet Take 1,000 mg by mouth daily.    [provider]  aspirin EC 81 MG EC tablet Take 1 tablet (81 mg total) by mouth daily. 11/16/19   Lorella Nimrod, MD  Cholecalciferol (VITAMIN D3) 250 MCG (10000 UT) capsule Take 10,000 Units by mouth daily with breakfast.     [provider]  Coenzyme Q10 (COQ10 PO) Take 120 mg by mouth daily with breakfast.    [provider]  Continuous Blood Gluc Sensor (FREESTYLE LIBRE 14 DAY SENSOR) MISC USE TO MONITOR BLOOD GLUCOSE. CHANGE EVERY 14 DAYS. 02/14/19   [provider]  CRANBERRY PO Take 4,200 mg by mouth daily.    [provider]  Cyanocobalamin (VITAMIN B-12) 5000 MCG SUBL Take 5,000 mcg by mouth 2 (two) times a week.    [provider]  diphenhydramine-acetaminophen (TYLENOL PM) 25-500 MG TABS tablet Take 1 tablet by mouth at bedtime as needed (sleep).    [provider]  [place] 565 MG CAPS Take 565 mg by mouth daily with breakfast.    [provider]  hydrALAZINE (APRESOLINE) 50 MG tablet Take 1 tablet by mouth twice daily Patient taking differently: Take 50 mg by mouth in the morning and at bedtime. Breakfast & supper 02/14/20   Jerline Pain, MD  insulin aspart (NOVOLOG) 100 UNIT/ML injection Inject 5-12 Units into the skin 3 (three) times daily before meals. Inject 8-12 units at breakfast, 5-8 units at lunch, 6-8 units at supper, may inject 2-3.5 units at bedtime when needed for high blood sugar    [provider]  insulin glargine (LANTUS) 100 UNIT/ML injection Inject 0.12 mLs (12 Units total) into the skin 2 (two) times daily. Inject 12 units at breakfast and 10 units at bedtime Patient taking differently: Inject 12 Units into the skin See admin instructions. Inject 12 units at breakfast and 12 units at bedtime 11/16/19   Lorella Nimrod, MD  lovastatin (MEVACOR) 20 MG tablet Take 20 mg by mouth daily with breakfast.     [provider]  metoprolol succinate (TOPROL-XL) 25  MG 24 hr tablet Take 1 tablet by mouth once daily Patient taking differently: Take 25 mg by mouth daily. 08/21/20   Jerline Pain, MD  Omega 3-6-9 Fatty Acids (TRIPLE OMEGA-3-6-9 PO) Take 1 capsule by mouth 2 (two) times a week.    [provider]  ramipril (ALTACE) 10 MG capsule Take 1 capsule by mouth twice daily Patient taking differently: Take 10 mg by mouth 2 (two) times daily. 10/01/20   Jerline Pain, MD  tamsulosin (FLOMAX) 0.4 MG CAPS capsule Take 0.4 mg by mouth at bedtime.    [provider]  vitamin E 1000 UNIT capsule Take 1,000 Units by mouth every evening.     [provider]  Zinc 50 MG CAPS Take 50 mg by mouth daily with breakfast.     [provider]    Physical Exam: Constitutional: Moderately built and nourished. Vitals:   12/12/2020 2037 11/17/2020 2040 12/15/2020 2045 12/09/2020 2215  BP: (!) 143/62  115/73 130/64  Pulse: (!) 120  77 (!) 115  Resp: (!) 23  (!) 22 (!) 24  SpO2: 100% 100%  100%   Eyes: Anicteric no pallor. ENMT: No discharge from the ears eyes nose or mouth. Neck: No mass felt.  No neck rigidity. Respiratory: No rhonchi or crepitations. Cardiovascular: S1-S2 heard. Abdomen: Soft nontender bowel sounds present. Musculoskeletal: No edema. Skin: No rash. Neurologic: Confused moving all extremities pupils are reacting to light. Psychiatric: Confused.   Labs on Admission: I have personally reviewed following labs and imaging  studies  CBC: Recent Labs  Lab 12/06/2020 2132 12/03/2020 2152 12/13/2020 2153  WBC 18.4*  --   --   NEUTROABS 17.0*  --   --   HGB 11.8* 12.6* 12.2*  HCT 40.5 37.0* 36.0*  MCV 104.4*  --   --   PLT 459*  --   --    Basic Metabolic Panel: Recent Labs  Lab 12/02/2020 2132 11/27/2020 2152 12/06/2020 2153  NA 136 134* 134*  K 6.7* 6.6* 6.6*  CL 98 106  --   CO2 7*  --   --   GLUCOSE 819* >700*  --   BUN 80* 78*  --   CREATININE 2.98* 2.10*  --   CALCIUM 11.0*  --   --    GFR: Estimated Creatinine Clearance: 23.8 mL/min (A) (by C-G formula based on SCr of 2.1 mg/dL (H)). Liver Function Tests: Recent Labs  Lab 11/18/2020 2132  AST 45*  ALT 38  ALKPHOS 125  BILITOT 1.9*  PROT 6.9  ALBUMIN 3.5   No results for input(s): LIPASE, AMYLASE in the last 168 hours. No results for input(s): AMMONIA in the last 168 hours. Coagulation Profile: Recent Labs  Lab 12/11/2020 2132  INR 1.3*   Cardiac Enzymes: Recent Labs  Lab 11/19/2020 2132  CKTOTAL 547*   BNP (last 3 results) No results for input(s): PROBNP in the last 8760 hours. HbA1C: No results for input(s): HGBA1C in the last 72 hours. CBG: Recent Labs  Lab 11/21/2020 2053  GLUCAP >600*   Lipid Profile: No results for input(s): CHOL, HDL, LDLCALC, TRIG, CHOLHDL, LDLDIRECT in the last 72 hours. Thyroid Function Tests: No results for input(s): TSH, T4TOTAL, FREET4, T3FREE, THYROIDAB in the last 72 hours. Anemia Panel: No results for input(s): VITAMINB12, FOLATE, FERRITIN, TIBC, IRON, RETICCTPCT in the last 72 hours. Urine analysis:    Component Value Date/Time   COLORURINE YELLOW 11/15/2019 0243   APPEARANCEUR CLEAR 11/15/2019 0243   LABSPEC >  1.046 (H) 11/15/2019 0243   PHURINE 6.0 11/15/2019 0243   GLUCOSEU >=500 (A) 11/15/2019 0243   HGBUR NEGATIVE 11/15/2019 0243   BILIRUBINUR NEGATIVE 11/15/2019 0243   KETONESUR 20 (A) 11/15/2019 0243   PROTEINUR NEGATIVE 11/15/2019 0243   UROBILINOGEN 1.0 02/12/2014 2348    NITRITE NEGATIVE 11/15/2019 0243   LEUKOCYTESUR NEGATIVE 11/15/2019 0243   Sepsis Labs: _0 (procalcitonin:4,lacticidven:4) ) Recent Results (from the past 240 hour(s))  Resp Panel by RT-PCR (Flu A&B, Covid) Nasopharyngeal Swab     Status: None   Collection Time: 12/10/2020  8:59 PM   Specimen: Nasopharyngeal Swab; Nasopharyngeal(NP) swabs in vial transport medium  Result Value Ref Range Status   SARS Coronavirus 2 by RT PCR NEGATIVE NEGATIVE Final    Comment: (NOTE) SARS-CoV-2 target nucleic acids are NOT DETECTED.  The SARS-CoV-2 RNA is generally detectable in upper respiratory specimens during the acute phase of infection. The lowest concentration of SARS-CoV-2 viral copies this assay can detect is 138 copies/mL. A negative result does not preclude SARS-Cov-2 infection and should not be used as the sole basis for treatment or other patient management decisions. A negative result may occur with  improper specimen collection/handling, submission of specimen other than nasopharyngeal swab, presence of viral mutation(s) within the areas targeted by this assay, and inadequate number of viral copies(<138 copies/mL). A negative result must be combined with clinical observations, patient history, and epidemiological information. The expected result is Negative.  Fact Sheet for Patients:  EntrepreneurPulse.com.au  Fact Sheet for Healthcare Providers:  IncredibleEmployment.be  This test is no t yet approved or cleared by the [place] FDA and  has been authorized for detection and/or diagnosis of SARS-CoV-2 by FDA under an Emergency Use Authorization (EUA). This EUA will remain  in effect (meaning this test can be used) for the duration of the COVID-19 declaration under Section 564(b)(1) of the Act, 21 U.S.C.section 360bbb-3(b)(1), unless the authorization is terminated  or revoked sooner.       Influenza A by PCR NEGATIVE NEGATIVE Final    Influenza B by PCR NEGATIVE NEGATIVE Final    Comment: (NOTE) The Xpert Xpress SARS-CoV-2/FLU/RSV plus assay is intended as an aid in the diagnosis of influenza from Nasopharyngeal swab specimens and should not be used as a sole basis for treatment. Nasal washings and aspirates are unacceptable for Xpert Xpress SARS-CoV-2/FLU/RSV testing.  Fact Sheet for Patients: EntrepreneurPulse.com.au  Fact Sheet for Healthcare Providers: IncredibleEmployment.be  This test is not yet approved or cleared by the [place] FDA and has been authorized for detection and/or diagnosis of SARS-CoV-2 by FDA under an Emergency Use Authorization (EUA). This EUA will remain in effect (meaning this test can be used) for the duration of the COVID-19 declaration under Section 564(b)(1) of the Act, 21 U.S.C. section 360bbb-3(b)(1), unless the authorization is terminated or revoked.  Performed at [facility], [place] [address] [place], [place] [address]      Radiological Exams on Admission: CT Head Wo Contrast  Result Date: 11/27/2020 CLINICAL DATA:  Mental status change EXAM: CT HEAD WITHOUT CONTRAST TECHNIQUE: Contiguous axial images were obtained from the base of the skull through the vertex without intravenous contrast. COMPARISON:  CT 11/14/2019, MRI 11/15/2019 CT 04/12/2014 FINDINGS: Brain: No acute territorial infarction, hemorrhage, or intracranial mass is visualized. Moderate atrophy. Chronic infarcts within the right cerebellum, left occipital lobe and right operculum. Stable ventricle size. Vascular: No hyperdense vessels.  Carotid vascular calcification Skull: No acute fracture Sinuses/Orbits: No acute finding. Mucosal thickening  or retention cyst in the right maxillary sinus. Other: Chronic fracture deformity of C1 with chronic malalignment of the dens and clivus. IMPRESSION: 1. No CT evidence for acute intracranial abnormality. 2. Atrophy and multifocal  chronic infarcts. Electronically Signed   By: Donavan Foil M.D.   On: 11/19/2020 22:03   DG Chest Port 1 View  Result Date: 11/17/2020 CLINICAL DATA:  Altered mental status and hyperglycemia. EXAM: PORTABLE CHEST 1 VIEW COMPARISON:  December 29, 2015 FINDINGS: The heart size and mediastinal contours are within normal limits. Both lungs are clear. Multiple chronic left-sided rib fractures are seen. A chronic seventh right rib fracture is also seen. IMPRESSION: No active disease. Electronically Signed   By: Virgina Norfolk M.D.   On: 11/29/2020 21:15    EKG: Independently reviewed.  A. fib with RVR.  Assessment/Plan Principal Problem:   DKA (diabetic ketoacidosis) ([place]) Active Problems:   Essential hypertension   PAF (paroxysmal atrial fibrillation) ([facility])   AKI (acute kidney injury) ([place])    1. Diabetic ketoacidosis not sure if patient was taking his medications.  We will check hemoglobin A1c.  Patient is presently on IV insulin infusion and fluids closely follow metabolic panel until anion gap gets corrected. 2. Acute encephalopathy could be from metabolic reasons due to renal failure and diabetic ketoacidosis.  We will also check MRI brain.  Since patient has been recently having deteriorating mental status will check ammonia RPR TSH B12 levels.  Patient like to be given 2 doses of Ativan IV 0.25 mg for agitation. 3. Acute renal failure likely from poor oral intake and dehydration I think will improve with fluids.  Follow metabolic panel.   4. A. fib with RVR probably patient was not taking his medicines.  I have ordered 1 dose of Cardizem 10 mg IV bolus followed by scheduled dose of IV metoprolol.  Patient usually takes apixaban but since patient is still confused and will get speech therapy evaluation until then we will keep patient on IV heparin.  Check cardiac markers and TSH. 5. Chronic anemia follow CBC. 6. Recently diagnosed bladder cancer being followed by urologist.  Has  appointment with oncologist next week. 7. History of colon cancer status post right hemicolectomy.  Since patient has DKA with acute renal failure acute encephalopathy with agitation A. fib with RVR will need inpatient status.   DVT prophylaxis: Heparin infusion. Code Status: DNR confirmed with patient's daughter. Family Communication: Patient's daughter. Disposition Plan: To be determined may need skilled nursing facility. Consults called: Palliative care and social worker. Admission status: Inpatient.   Rise Patience MD [facility] Pager [phone].  If 7PM-7AM, please contact night-coverage [email or web address] Password [facility]  11/26/2020, 11:29 PM

## 2020-12-04 NOTE — ED Provider Notes (Signed)
[facility] EMERGENCY DEPARTMENT Provider Note   CSN: [id number] Arrival date & time: 11/22/2020  2032     History Chief Complaint  Patient presents with  . Altered Mental Status  . Hyperglycemia    Curtis Dunn is a 80 y.o. male.  The history is provided by a relative and medical records.   Curtis Dunn is a 80 y.o. male who presents to the Emergency Department complaining of AMS.  Level V caveat due to AMS.  He presents to the ED by EMS for AMS.   Additional history is available from the patient's daughter after his initial presentation. He has a history of brittle type I diabetes. He recently suffered from a stroke and was diagnosed with bladder cancer and had surgery performed about one month ago. He also lost his son unexpectedly recently. She reports over the last month he has had a decline in his mental status. She currently lives in [place] and has people checking on him frequently. She did talk to him yesterday but he seemed to be slightly more confused. Today he would not answer the phone and she had EMS perform a well check. He was found unresponsive today. His daughter recently attempted to get him in home care but he refused. He does have an oncology appointment scheduled for tomorrow.      Past Medical History:  Diagnosis Date  . Arthritis    left thumb  . Broken neck ([place])    in March 2015  . Cataract   . Colon cancer ([place])   . Colon polyp   . Diabetes mellitus    Type 1  since 1974  . Dysrhythmia    a fib  . Gout   . Hyperlipidemia   . Hypertension   . Lipoma of back   . Paroxysmal atrial fibrillation (HCC)   . Stroke [facility]    11/14/19  no defecit     Patient Active Problem List   Diagnosis Date Noted  . DKA (diabetic ketoacidosis) ([place]) 12/09/2020  . Displaced comminuted fracture of left patella, initial encounter for closed fracture 03/16/2020  . Fractured patella 03/09/2020  . Cerebellar stroke ([place]) 11/15/2019   . Acute CVA (cerebrovascular accident) ([place]) 11/14/2019  . Bilateral impacted cerumen 10/10/2019  . Fungal otitis externa 10/10/2019  . Sensorineural hearing loss (SNHL) of both ears 10/10/2019  . Pain in joint of right ankle 05/12/2019  . Cellulitis and abscess of foot, except toes 05/03/2019  . Paronychia of great toe of right foot 05/03/2019  . Multiple fractures of ribs of both sides 12/29/2015  . Flail chest 12/25/2015  . AKI (acute kidney injury) ([place]) 12/25/2015  . Hemopneumothorax on left 12/25/2015  . IDDM (insulin dependent diabetes mellitus) 12/25/2015  . History of colon cancer 08/21/2014  . Acute blood loss anemia 02/11/2014  . Fracture dislocation of cervical spine ([place]) 02/10/2014  . C1 cervical fracture ([place]) 02/10/2014  . Fall 02/10/2014  . Distal radius fracture, right 02/10/2014  . Concussion 02/10/2014  . Facial laceration 02/10/2014  . Anticoagulated 02/10/2014  . PAF (paroxysmal atrial fibrillation) ([place]) 09/08/2012  . Palpitations 03/05/2012  . MALIGNANT NEOPLASM OF CECUM 12/11/2007  . COLONIC POLYPS 12/11/2007  . Type 1 diabetes mellitus ([place]) 12/11/2007  . Essential hypertension 12/11/2007    Past Surgical History:  Procedure Laterality Date  . ANKLE SURGERY     right  . APPENDECTOMY  2001  . CATARACT EXTRACTION EXTRACAPSULAR Bilateral   . COLON RESECTION    .  ELBOW FRACTURE SURGERY    . HERNIA REPAIR     umbilica and left inguinal  . INGUINAL HERNIA REPAIR Left 03/13/2016   Procedure: LEFT INGUINAL HERNIA REPAIR WITH UNBILICAL HERNIA REPAIR;  Surgeon: [name], MD;  Location: [place];  Service: General;  Laterality: Left;  . INSERTION OF MESH Left 03/13/2016   Procedure: INSERTION OF MESH;  Surgeon: [name], MD;  Location: [place];  Service: General;  Laterality: Left;  [name] MASS EXCISION N/A 07/20/2019   Procedure: EXCISION OF BACK MASS;  Surgeon: Rolm Bookbinder, MD;  Location: [place];  Service: General;  Laterality: N/A;   MAC AND LOCAL  . ORIF PATELLA Left 03/16/2020   Procedure: [name];  Surgeon: Sydnee Cabal, MD;  Location: [facility];  Service: Orthopedics;  Laterality: Left;  femoral nerve block vs general  . SHOULDER SURGERY Right    right shoulder surgery  rmoval of tendon  . TRANSURETHRAL RESECTION OF BLADDER TUMOR N/A 11/02/2020   Procedure: TRANSURETHRAL RESECTION OF BLADDER TUMOR (TURBT) GREATER THAN 5CM;  Surgeon: Festus Aloe, MD;  Location: [facility];  Service: Urology;  Laterality: N/A;  GENERAL W/ PARALYSIS  . VASECTOMY         Family History  Problem Relation Age of Onset  . Heart disease Father   . Alzheimer's disease Mother   . Other Son        MVA, Covid 26  . Colon cancer Neg Hx   . Esophageal cancer Neg Hx   . Rectal cancer Neg Hx   . Stomach cancer Neg Hx     Social History   Tobacco Use  . Smoking status: Former Smoker    Types: Cigars  . Smokeless tobacco: Never Used  . Tobacco comment: occasional cigar  no longer   Vaping Use  . Vaping Use: Never used  Substance Use Topics  . Alcohol use: Not Currently    Comment: rare  . Drug use: No    Home Medications Prior to Admission medications   Medication Sig Start Date End Date Taking? Authorizing Provider  allopurinol (ZYLOPRIM) 100 MG tablet Take 100 mg by mouth daily with breakfast.  06/02/19   [provider]  amLODipine (NORVASC) 5 MG tablet Take 1 tablet (5 mg total) by mouth daily. Overdue follow up appt needed for further refills 1st attempt 11/28/20   Jerline Pain, MD  apixaban (ELIQUIS) 5 MG TABS tablet Take 1 tablet (5 mg total) by mouth 2 (two) times daily. 11/04/20   Festus Aloe, MD  Ascorbic Acid (VITAMIN C) 1000 MG tablet Take 1,000 mg by mouth daily.    [provider]  aspirin EC 81 MG EC tablet Take 1 tablet (81 mg total) by mouth daily. 11/16/19   Lorella Nimrod, MD  Cholecalciferol (VITAMIN D3) 250 MCG (10000 UT) capsule Take 10,000 Units by mouth daily with breakfast.      [provider]  Coenzyme Q10 (COQ10 PO) Take 120 mg by mouth daily with breakfast.    [provider]  Continuous Blood Gluc Sensor (FREESTYLE LIBRE 14 DAY SENSOR) MISC USE TO MONITOR BLOOD GLUCOSE. CHANGE EVERY 14 DAYS. 02/14/19   [provider]  CRANBERRY PO Take 4,200 mg by mouth daily.    [provider]  Cyanocobalamin (VITAMIN B-12) 5000 MCG SUBL Take 5,000 mcg by mouth 2 (two) times a week.    [provider]  diphenhydramine-acetaminophen (TYLENOL PM) 25-500 MG TABS tablet Take 1 tablet by mouth at bedtime  as needed (sleep).    [provider]  [place] 565 MG CAPS Take 565 mg by mouth daily with breakfast.    [provider]  hydrALAZINE (APRESOLINE) 50 MG tablet Take 1 tablet by mouth twice daily Patient taking differently: Take 50 mg by mouth in the morning and at bedtime. Breakfast & supper 02/14/20   Jerline Pain, MD  insulin aspart (NOVOLOG) 100 UNIT/ML injection Inject 5-12 Units into the skin 3 (three) times daily before meals. Inject 8-12 units at breakfast, 5-8 units at lunch, 6-8 units at supper, may inject 2-3.5 units at bedtime when needed for high blood sugar    [provider]  insulin glargine (LANTUS) 100 UNIT/ML injection Inject 0.12 mLs (12 Units total) into the skin 2 (two) times daily. Inject 12 units at breakfast and 10 units at bedtime Patient taking differently: Inject 12 Units into the skin See admin instructions. Inject 12 units at breakfast and 12 units at bedtime 11/16/19   Lorella Nimrod, MD  lovastatin (MEVACOR) 20 MG tablet Take 20 mg by mouth daily with breakfast.     [provider]  metoprolol succinate (TOPROL-XL) 25 MG 24 hr tablet Take 1 tablet by mouth once daily Patient taking differently: Take 25 mg by mouth daily. 08/21/20   Jerline Pain, MD  Omega 3-6-9 Fatty Acids (TRIPLE OMEGA-3-6-9 PO) Take 1 capsule by mouth 2 (two) times a week.    [provider]   ramipril (ALTACE) 10 MG capsule Take 1 capsule by mouth twice daily Patient taking differently: Take 10 mg by mouth 2 (two) times daily. 10/01/20   Jerline Pain, MD  tamsulosin (FLOMAX) 0.4 MG CAPS capsule Take 0.4 mg by mouth at bedtime.    [provider]  vitamin E 1000 UNIT capsule Take 1,000 Units by mouth every evening.     [provider]  Zinc 50 MG CAPS Take 50 mg by mouth daily with breakfast.     [provider]    Allergies    Patient has no known allergies.  Review of Systems   Review of Systems  Unable to perform ROS: Mental status change    Physical Exam Updated Vital Signs BP 130/64 (BP Location: Right Arm)   Pulse (!) 115   Resp (!) 24   SpO2 100%   Physical Exam Vitals and nursing note reviewed.  Constitutional:      General: He is in acute distress.     Appearance: He is well-developed and well-nourished. He is ill-appearing.     Comments: Drowsy but arouses to verbal stimuli  HENT:     Head: Normocephalic and atraumatic.  Cardiovascular:     Rate and Rhythm: Regular rhythm. Tachycardia present.     Heart sounds: No murmur heard.   Pulmonary:     Comments: tachypnea Abdominal:     Palpations: Abdomen is soft.     Tenderness: There is no abdominal tenderness. There is no guarding or rebound.  Musculoskeletal:        General: No tenderness or edema.  Skin:    General: Skin is warm and dry.  Neurological:     Comments: Drowsy.  Oriented to person.  Dysarthric speech.  Profound generalized weakness.    Psychiatric:        Mood and Affect: Mood and affect normal.     Comments: Unable to assess     ED Results / Procedures / Treatments   Labs (all labs ordered are listed,  but only abnormal results are displayed) Labs Reviewed  LACTIC ACID, PLASMA - Abnormal; Notable for the following components:      Result Value   Lactic Acid, Venous 2.9 (*)    All other components within normal limits  COMPREHENSIVE METABOLIC  PANEL - Abnormal; Notable for the following components:   Potassium 6.7 (*)    CO2 7 (*)    Glucose, Bld 819 (*)    BUN 80 (*)    Creatinine, Ser 2.98 (*)    Calcium 11.0 (*)    AST 45 (*)    Total Bilirubin 1.9 (*)    GFR, Estimated 21 (*)    Anion gap 31 (*)    All other components within normal limits  CBC WITH DIFFERENTIAL/PLATELET - Abnormal; Notable for the following components:   WBC 18.4 (*)    RBC 3.88 (*)    Hemoglobin 11.8 (*)    MCV 104.4 (*)    MCHC 29.1 (*)    Platelets 459 (*)    Neutro Abs 17.0 (*)    Lymphs Abs 0.4 (*)    Abs Immature Granulocytes 0.12 (*)    All other components within normal limits  PROTIME-INR - Abnormal; Notable for the following components:   Prothrombin Time 15.4 (*)    INR 1.3 (*)    All other components within normal limits  CK - Abnormal; Notable for the following components:   Total CK 547 (*)    All other components within normal limits  CBG MONITORING, ED - Abnormal; Notable for the following components:   Glucose-Capillary >600 (*)    All other components within normal limits  I-STAT CHEM 8, ED - Abnormal; Notable for the following components:   Sodium 134 (*)    Potassium 6.6 (*)    BUN 78 (*)    Creatinine, Ser 2.10 (*)    Glucose, Bld >700 (*)    Calcium, Ion 1.41 (*)    TCO2 12 (*)    Hemoglobin 12.6 (*)    HCT 37.0 (*)    All other components within normal limits  I-STAT VENOUS BLOOD GAS, ED - Abnormal; Notable for the following components:   pH, Ven 7.108 (*)    pCO2, Ven 28.3 (*)    pO2, Ven 85.0 (*)    Bicarbonate 8.9 (*)    TCO2 10 (*)    Acid-base deficit 19.0 (*)    Sodium 134 (*)    Potassium 6.6 (*)    Calcium, Ion 1.41 (*)    HCT 36.0 (*)    Hemoglobin 12.2 (*)    All other components within normal limits  CULTURE, BLOOD (SINGLE)  URINE CULTURE  RESP PANEL BY RT-PCR (FLU A&B, COVID) ARPGX2  APTT  LACTIC ACID, PLASMA  URINALYSIS, ROUTINE W REFLEX MICROSCOPIC  BASIC METABOLIC PANEL  BASIC  METABOLIC PANEL  BASIC METABOLIC PANEL  BASIC METABOLIC PANEL  BETA-HYDROXYBUTYRIC ACID  BETA-HYDROXYBUTYRIC ACID  CBG MONITORING, ED    EKG EKG Interpretation  Date/Time:  Tuesday December 04 2020 20:39:31 EST Ventricular Rate:  114 PR Interval:    QRS Duration: 109 QT Interval:  330 QTC Calculation: 455 R Axis:   -62 Text Interpretation: Atrial fibrillation Paired ventricular premature complexes Left anterior fascicular block Abnormal R-wave progression, early transition LVH with secondary repolarization abnormality Poor data quality Confirmed by Quintella Reichert [phone]) on 11/24/2020 9:27:00 PM   Radiology CT Head Wo Contrast  Result Date: 11/22/2020 CLINICAL DATA:  Mental status change EXAM: CT HEAD  WITHOUT CONTRAST TECHNIQUE: Contiguous axial images were obtained from the base of the skull through the vertex without intravenous contrast. COMPARISON:  CT 11/14/2019, MRI 11/15/2019 CT 04/12/2014 FINDINGS: Brain: No acute territorial infarction, hemorrhage, or intracranial mass is visualized. Moderate atrophy. Chronic infarcts within the right cerebellum, left occipital lobe and right operculum. Stable ventricle size. Vascular: No hyperdense vessels.  Carotid vascular calcification Skull: No acute fracture Sinuses/Orbits: No acute finding. Mucosal thickening or retention cyst in the right maxillary sinus. Other: Chronic fracture deformity of C1 with chronic malalignment of the dens and clivus. IMPRESSION: 1. No CT evidence for acute intracranial abnormality. 2. Atrophy and multifocal chronic infarcts. Electronically Signed   By: Donavan Foil M.D.   On: 11/22/2020 22:03   DG Chest Port 1 View  Result Date: 11/19/2020 CLINICAL DATA:  Altered mental status and hyperglycemia. EXAM: PORTABLE CHEST 1 VIEW COMPARISON:  December 29, 2015 FINDINGS: The heart size and mediastinal contours are within normal limits. Both lungs are clear. Multiple chronic left-sided rib fractures are seen. A chronic  seventh right rib fracture is also seen. IMPRESSION: No active disease. Electronically Signed   By: Virgina Norfolk M.D.   On: 12/03/2020 21:15    Procedures Procedures (including critical care time) CRITICAL CARE Performed by: Quintella Reichert   Total critical care time: 45 minutes  Critical care time was exclusive of separately billable procedures and treating other patients.  Critical care was necessary to treat or prevent imminent or life-threatening deterioration.  Critical care was time spent personally by me on the following activities: development of treatment plan with patient and/or surrogate as well as nursing, discussions with consultants, evaluation of patient's response to treatment, examination of patient, obtaining history from patient or surrogate, ordering and performing treatments and interventions, ordering and review of laboratory studies, ordering and review of radiographic studies, pulse oximetry and re-evaluation of patient's condition.  Medications Ordered in ED Medications  insulin regular, human (MYXREDLIN) 100 units/ 100 mL infusion (has no administration in time range)  lactated ringers infusion (has no administration in time range)  dextrose 5 % in lactated ringers infusion (has no administration in time range)  dextrose 50 % solution 0-50 mL (has no administration in time range)  sodium chloride 0.9 % bolus 1,000 mL (1,000 mLs Intravenous New Bag/Given 12/11/2020 2116)  sodium chloride 0.9 % bolus 2,000 mL (1,000 mLs Intravenous New Bag/Given 11/25/2020 2211)  sodium bicarbonate injection 50 mEq (50 mEq Intravenous Given 12/01/2020 2253)    ED Course  I have reviewed the triage vital signs and the nursing notes.  Pertinent labs & imaging results that were available during my care of the patient were reviewed by me and considered in my medical decision making (see chart for details).    MDM Rules/Calculators/A&P                         patient with history of  diabetes here for evaluation of altered mental status in the setting of progressive mental decline over the last month. He is dehydrated and ill appearing on evaluation. Lab significant for hyperglycemia, metabolic acidosis consistent with DKA with acute kidney injury. He does have associated hyperkalemia. He was treated with IV fluid resuscitation, insulin drip for DKA with hyperkalemia. Discussed with hospitalist for admission for ongoing treatment.  Final Clinical Impression(s) / ED Diagnoses Final diagnoses:  Diabetic ketoacidosis without coma associated with type 1 diabetes mellitus (HCC)  AKI (acute kidney injury) ([place])  Hyperkalemia  Rx / DC Orders ED Discharge Orders    None       Quintella Reichert, MD 12/09/2020 2315

## 2020-12-04 NOTE — ED Triage Notes (Signed)
Pt arrived via EMS due to pt  being altered mentally as well as having high blood sugar. Pts family kept trying to call pt, pt was not responding. EMS arrived as a wellness visit and found pt in bed confused. Ems reports pt is a and 0 x 2. Ems also reports pt blood sugar read high. Pt has a hx of stroke, hypertension, diabetes, and bladder cancer. Ems reports vitals were stable.

## 2020-12-05 ENCOUNTER — Inpatient Hospital Stay (HOSPITAL_COMMUNITY): Payer: HMO

## 2020-12-05 ENCOUNTER — Inpatient Hospital Stay: Payer: HMO | Admitting: Oncology

## 2020-12-05 DIAGNOSIS — R410 Disorientation, unspecified: Secondary | ICD-10-CM

## 2020-12-05 DIAGNOSIS — Z515 Encounter for palliative care: Secondary | ICD-10-CM

## 2020-12-05 DIAGNOSIS — N179 Acute kidney failure, unspecified: Secondary | ICD-10-CM | POA: Diagnosis not present

## 2020-12-05 DIAGNOSIS — I480 Paroxysmal atrial fibrillation: Secondary | ICD-10-CM | POA: Diagnosis not present

## 2020-12-05 DIAGNOSIS — Z789 Other specified health status: Secondary | ICD-10-CM

## 2020-12-05 DIAGNOSIS — E1010 Type 1 diabetes mellitus with ketoacidosis without coma: Secondary | ICD-10-CM | POA: Diagnosis not present

## 2020-12-05 DIAGNOSIS — Z66 Do not resuscitate: Secondary | ICD-10-CM

## 2020-12-05 LAB — BASIC METABOLIC PANEL
Anion gap: 24 — ABNORMAL HIGH (ref 5–15)
Anion gap: 12 (ref 5–15)
Anion gap: 11 (ref 5–15)
BUN: 60 mg/dL — ABNORMAL HIGH (ref 8–23)
BUN: 58 mg/dL — ABNORMAL HIGH (ref 8–23)
BUN: 54 mg/dL — ABNORMAL HIGH (ref 8–23)
CO2: 8 mmol/L — ABNORMAL LOW (ref 22–32)
CO2: 24 mmol/L (ref 22–32)
CO2: 27 mmol/L (ref 22–32)
Calcium: 9 mg/dL (ref 8.9–10.3)
Calcium: 11.5 mg/dL — ABNORMAL HIGH (ref 8.9–10.3)
Calcium: 11.3 mg/dL — ABNORMAL HIGH (ref 8.9–10.3)
Chloride: 106 mmol/L (ref 98–111)
Chloride: 113 mmol/L — ABNORMAL HIGH (ref 98–111)
Chloride: 110 mmol/L (ref 98–111)
Creatinine, Ser: 2.08 mg/dL — ABNORMAL HIGH (ref 0.61–1.24)
Creatinine, Ser: 1.78 mg/dL — ABNORMAL HIGH (ref 0.61–1.24)
Creatinine, Ser: 1.66 mg/dL — ABNORMAL HIGH (ref 0.61–1.24)
GFR, Estimated: 32 mL/min — ABNORMAL LOW (ref >60)
GFR, Estimated: 38 mL/min — ABNORMAL LOW (ref >60)
GFR, Estimated: 42 mL/min — ABNORMAL LOW (ref >60)
Glucose, Bld: 464 mg/dL — ABNORMAL HIGH (ref 70–99)
Glucose, Bld: 190 mg/dL — ABNORMAL HIGH (ref 70–99)
Glucose, Bld: 236 mg/dL — ABNORMAL HIGH (ref 70–99)
Potassium: 4.7 mmol/L (ref 3.5–5.1)
Potassium: 3.7 mmol/L (ref 3.5–5.1)
Potassium: 3.6 mmol/L (ref 3.5–5.1)
Sodium: 138 mmol/L (ref 135–145)
Sodium: 149 mmol/L — ABNORMAL HIGH (ref 135–145)
Sodium: 148 mmol/L — ABNORMAL HIGH (ref 135–145)

## 2020-12-05 LAB — BLOOD GAS, VENOUS
Acid-Base Excess: 5.1 mmol/L — ABNORMAL HIGH (ref 0.0–2.0)
Bicarbonate: 29.9 mmol/L — ABNORMAL HIGH (ref 20.0–28.0)
Drawn by: 1370
FIO2: 21
O2 Saturation: 28.9 %
Patient temperature: 36.5
pCO2, Ven: 49.8 mmHg (ref 44.0–60.0)
pH, Ven: 7.393 (ref 7.250–7.430)
pO2, Ven: <30 mmHg — CL (ref 32.0–45.0)

## 2020-12-05 LAB — CBG MONITORING, ED
Glucose-Capillary: >600 mg/dL (ref 70–99)
Glucose-Capillary: 451 mg/dL — ABNORMAL HIGH (ref 70–99)
Glucose-Capillary: 443 mg/dL — ABNORMAL HIGH (ref 70–99)
Glucose-Capillary: 344 mg/dL — ABNORMAL HIGH (ref 70–99)
Glucose-Capillary: 287 mg/dL — ABNORMAL HIGH (ref 70–99)
Glucose-Capillary: 212 mg/dL — ABNORMAL HIGH (ref 70–99)
Glucose-Capillary: 233 mg/dL — ABNORMAL HIGH (ref 70–99)
Glucose-Capillary: 203 mg/dL — ABNORMAL HIGH (ref 70–99)
Glucose-Capillary: 173 mg/dL — ABNORMAL HIGH (ref 70–99)

## 2020-12-05 LAB — URINALYSIS, ROUTINE W REFLEX MICROSCOPIC
Bilirubin Urine: NEGATIVE
Glucose, UA: >=500 mg/dL — AB
Ketones, ur: 20 mg/dL — AB
Nitrite: NEGATIVE
Protein, ur: 100 mg/dL — AB
Specific Gravity, Urine: 1.017 (ref 1.005–1.030)
WBC, UA: >50 WBC/hpf — ABNORMAL HIGH (ref 0–5)
pH: 5 (ref 5.0–8.0)

## 2020-12-05 LAB — CK: Total CK: 624 U/L — ABNORMAL HIGH (ref 49–397)

## 2020-12-05 LAB — VITAMIN B12: Vitamin B-12: 3252 pg/mL — ABNORMAL HIGH (ref 180–914)

## 2020-12-05 LAB — BETA-HYDROXYBUTYRIC ACID
Beta-Hydroxybutyric Acid: >8 mmol/L — ABNORMAL HIGH (ref 0.05–0.27)
Beta-Hydroxybutyric Acid: 0.39 mmol/L — ABNORMAL HIGH (ref 0.05–0.27)
Beta-Hydroxybutyric Acid: 0.36 mmol/L — ABNORMAL HIGH (ref 0.05–0.27)

## 2020-12-05 LAB — TROPONIN I (HIGH SENSITIVITY)
Troponin I (High Sensitivity): 66 ng/L — ABNORMAL HIGH (ref <18)
Troponin I (High Sensitivity): 73 ng/L — ABNORMAL HIGH (ref <18)
Troponin I (High Sensitivity): 59 ng/L — ABNORMAL HIGH (ref <18)

## 2020-12-05 LAB — GLUCOSE, CAPILLARY
Glucose-Capillary: 174 mg/dL — ABNORMAL HIGH (ref 70–99)
Glucose-Capillary: 234 mg/dL — ABNORMAL HIGH (ref 70–99)
Glucose-Capillary: 184 mg/dL — ABNORMAL HIGH (ref 70–99)
Glucose-Capillary: 135 mg/dL — ABNORMAL HIGH (ref 70–99)
Glucose-Capillary: 218 mg/dL — ABNORMAL HIGH (ref 70–99)

## 2020-12-05 LAB — LACTIC ACID, PLASMA
Lactic Acid, Venous: 1.4 mmol/L (ref 0.5–1.9)
Lactic Acid, Venous: 3 mmol/L (ref 0.5–1.9)

## 2020-12-05 LAB — HEMOGLOBIN A1C
Hgb A1c MFr Bld: 7.5 % — ABNORMAL HIGH (ref 4.8–5.6)
Mean Plasma Glucose: 168.55 mg/dL

## 2020-12-05 LAB — HEPARIN LEVEL (UNFRACTIONATED): Heparin Unfractionated: 0.54 IU/mL (ref 0.30–0.70)

## 2020-12-05 LAB — APTT: aPTT: 30 seconds (ref 24–36)

## 2020-12-05 LAB — POTASSIUM: Potassium: 3.6 mmol/L (ref 3.5–5.1)

## 2020-12-05 LAB — MAGNESIUM: Magnesium: 2.8 mg/dL — ABNORMAL HIGH (ref 1.7–2.4)

## 2020-12-05 LAB — AMMONIA: Ammonia: 32 umol/L (ref 9–35)

## 2020-12-05 LAB — TSH: TSH: 4.913 u[IU]/mL — ABNORMAL HIGH (ref 0.350–4.500)

## 2020-12-05 LAB — PROCALCITONIN: Procalcitonin: 2.41 ng/mL

## 2020-12-05 MED ORDER — INSULIN GLARGINE 100 UNIT/ML ~~LOC~~ SOLN: 10.0000 [IU] | Freq: Once | SUBCUTANEOUS | Status: DC

## 2020-12-05 MED ORDER — INSULIN GLARGINE 100 UNIT/ML ~~LOC~~ SOLN
5.0000 [IU] | Freq: Every day | SUBCUTANEOUS | Status: DC
  Administered 2020-12-05: 5 [IU] via SUBCUTANEOUS
  Filled 2020-12-05 (×2): qty 0.05

## 2020-12-05 MED ORDER — POTASSIUM CHLORIDE 10 MEQ/100ML IV SOLN
10.0000 meq | INTRAVENOUS | Status: AC
  Administered 2020-12-05 (×2): 10 meq via INTRAVENOUS
  Filled 2020-12-05 (×2): qty 100

## 2020-12-05 MED ORDER — DILTIAZEM HCL 25 MG/5ML IV SOLN
10.0000 mg | Freq: Once | INTRAVENOUS | Status: AC
  Administered 2020-12-05: 10 mg via INTRAVENOUS
  Filled 2020-12-05: qty 5

## 2020-12-05 MED ORDER — DILTIAZEM LOAD VIA INFUSION
10.0000 mg | Freq: Once | INTRAVENOUS | Status: DC
  Filled 2020-12-05: qty 10

## 2020-12-05 MED ORDER — SODIUM CHLORIDE 0.9 % IV SOLN
1.0000 g | INTRAVENOUS | Status: DC
  Administered 2020-12-05: 1 g via INTRAVENOUS
  Filled 2020-12-05 (×3): qty 10

## 2020-12-05 MED ORDER — LORAZEPAM 2 MG/ML IJ SOLN
0.2500 mg | Freq: Once | INTRAMUSCULAR | Status: AC
  Administered 2020-12-05: 0.25 mg via INTRAVENOUS
  Filled 2020-12-05: qty 1

## 2020-12-05 MED ORDER — METOPROLOL TARTRATE 5 MG/5ML IV SOLN
2.5000 mg | Freq: Four times a day (QID) | INTRAVENOUS | Status: DC
  Administered 2020-12-05 – 2020-12-06 (×6): 2.5 mg via INTRAVENOUS
  Filled 2020-12-05 (×6): qty 5

## 2020-12-05 MED ORDER — INSULIN GLARGINE 100 UNIT/ML ~~LOC~~ SOLN: 10.0000 [IU] | Freq: Every day | SUBCUTANEOUS | Status: DC

## 2020-12-05 MED ORDER — HEPARIN (PORCINE) 25000 UT/250ML-% IV SOLN
900.0000 [IU]/h | INTRAVENOUS | Status: DC
  Administered 2020-12-05: 900 [IU]/h via INTRAVENOUS
  Filled 2020-12-05 (×4): qty 250

## 2020-12-05 NOTE — ED Notes (Signed)
Pt transported to MRI 

## 2020-12-05 NOTE — ED Notes (Signed)
Pt placed on bear hugger. MD made aware.

## 2020-12-05 NOTE — Consult Note (Signed)
Consultation Note Date: 12/05/2020   Patient Name: Curtis Dunn  DOB: 06-Feb-1941  MRN: [id number]  Age / Sex: 80 y.o., male  PCP: Reynold Bowen, MD Referring Physician: Kayleen Memos, DO  Reason for Consultation: Establishing goals of care  HPI/Patient Profile: 80 y.o. male  with past medical history of atrial fibrillation, stroke in December 2020, recent diagnosis of bladder cancer s/p TURB last month, colon cancer s/p hemicolectomy, DM, and HTN presented to the ED 11/28/2020 after daughter called EMS with concerns that patient was not answering her phone calls. EMS found patient in bed confused with stable vitals. Patient was admitted on 12/11/2020 with DKA, acute metabolic encephalopathy, AKI on CKD, atrial fibrillation with RVR.  ED Course: In the ER patient was confused and was in A. fib with RVR.  Labs were showing signs of DKA with blood sugar of 890 with bicarb of 7 anion gap of 31 creatinine increased from 1.6 and it is around 2.9 WBC count is 18.4 chest x-ray unremarkable UA is unremarkable.  Patient was started on fluid bolus and IV insulin for DKA.  CT head is unremarkable.  On my exam patient still confused.  For the A. fib with RVR I ordered 1 dose of Cardizem bolus 10 mg and Patient was getting IV metoprolol.  Family face treatment option decisions and anticipatory care needs.   Clinical Assessment and Goals of Care: I have reviewed medical records including EPIC notes, labs, and imaging. Received report from primary RN - no acute concerns. RN reports patient has been sleeping since arrival to floor from ED. RN states ED RN reported patient has been confused and combative. Patient has not been taking oral meds and is not eating or drinking.    Went to visit patient at bedside - no family/visitors present. Patient was lying in bed asleep - he did wake to voice/gentle touch but eyes do not open.  His speech remains extremely soft and at most times incoherant.  I was able to understand his answers to the following questions: How are you feeling?: "feel better" and are you having pain? "No ma'am." He was not able to tell me his name or answer other questions. No signs or non-verbal gestures of pain or discomfort noted. No respiratory distress, increased work of breathing, or secretions noted. Patient fell back asleep during visit.   4:20 PM Attempted to call daughter/Curtis to discuss diagnosis, prognosis, GOC, EOL wishes, disposition, and options - no answer - confidential voicemail left and PMT phone number provided with request to return call. If she does not return call PMT will reach out again tomorrow.    Primary Decision Maker NEXT OF KIN - as of now it seems to be emergency contact, daughter/Curtis Dunn - I have not had the chance to speak with her to verify HCPOA vs if other siblings should be involved with decision making.    SUMMARY OF RECOMMENDATIONS:  Continue current medial treatment until PMT can speak with daughter for [place]  PMT will reach back  out to daughter and attempt [place] again tomorrow 1/20  Code Status/Advance Care Planning:  DNR  Palliative Prophylaxis:   Aspiration, Bowel Regimen, Delirium Protocol, Frequent Pain Assessment, Palliative Wound Care and Turn Reposition  Additional Recommendations (Limitations, Scope, Preferences):  Full Scope Treatment  Psycho-social/Spiritual:   Desire for further Chaplaincy support: will reassess  Prognosis:   Unable to determine  Discharge Planning: To Be Determined      Primary Diagnoses: Present on Admission: . DKA (diabetic ketoacidosis) ([place]) . Essential hypertension . PAF (paroxysmal atrial fibrillation) ([place]) . AKI (acute kidney injury) ([place])   I have reviewed the medical record, interviewed the patient and family, and examined the patient. The following aspects are pertinent.  Past Medical  History:  Diagnosis Date  . Arthritis    left thumb  . Broken neck ([place])    in March 2015  . Cataract   . Colon cancer ([place])   . Colon polyp   . Diabetes mellitus    Type 1  since 1974  . Dysrhythmia    a fib  . Gout   . Hyperlipidemia   . Hypertension   . Lipoma of back   . Paroxysmal atrial fibrillation ([facility])   . Stroke [facility])    11/14/19  no defecit    Social History   Socioeconomic History  . Marital status: Widowed    Spouse name: Not on file  . Number of children: 2  . Years of education: Not on file  . Highest education level: Not on file  Occupational History  . 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 Smoking status: Former Smoker    Types: Cigars  . Smokeless tobacco: Never Used  . Tobacco comment: occasional cigar  no longer   Vaping Use  . Vaping Use: Never used  Substance and Sexual Activity  . Alcohol use: Not Currently    Comment: rare  . Drug use: No  . Sexual activity: Not Currently  Other Topics Concern  . Not on file  Social History Narrative   Lives alone in 2 story [place] [address] [place]   Has one cat named Mousse   Has one daughter and one son. Daughter lives in [place]   Social Determinants of Health   Financial Resource Strain: Low Risk   . Difficulty of Paying Living Expenses: Not hard at all  Food Insecurity: No Food Insecurity  . Worried About Charity fundraiser in the Last Year: Never true  . Ran Out of Food in the Last Year: Never true  Transportation Needs: No Transportation Needs  . Lack of Transportation (Medical): No  . Lack of Transportation (Non-Medical): No  Physical Activity: Sufficiently Active  . Days of Exercise per Week: 3 days  . Minutes of Exercise per Session: 120 min  Stress: No Stress Concern Present  . Feeling of Stress : Only a little  Social Connections: Moderately Isolated  . Frequency of Communication with Friends and Family: More than  three times a week  . Frequency of Social Gatherings with Friends and Family: More than three times a week  . Attends Religious Services: Never  . Active Member of Clubs or Organizations: Yes  . Attends Archivist Meetings: More than 4 times per year  . Marital Status: Widowed   Family History  Problem Relation Age of Onset  . Heart disease Father   . Alzheimer's disease Mother   .  Other Son        MVA, Covid 12  . Colon cancer Neg Hx   . Esophageal cancer Neg Hx   . Rectal cancer Neg Hx   . Stomach cancer Neg Hx    Scheduled Meds: . insulin glargine  5 Units Subcutaneous Daily  . metoprolol tartrate  2.5 mg Intravenous Q6H   Continuous Infusions: . cefTRIAXone (ROCEPHIN)  IV 1 g (12/05/20 1443)  . dextrose 5% lactated ringers 100 mL/hr at 12/05/20 1434  . heparin 900 Units/hr (12/05/20 1504)  . insulin 2.8 Units/hr (12/05/20 1507)   PRN Meds:.dextrose Medications Prior to Admission:  Prior to Admission medications   Medication Sig Start Date End Date Taking? Authorizing Provider  allopurinol (ZYLOPRIM) 100 MG tablet Take 100 mg by mouth daily with breakfast.  06/02/19   [provider]  amLODipine (NORVASC) 5 MG tablet Take 1 tablet (5 mg total) by mouth daily. Overdue follow up appt needed for further refills 1st attempt 11/28/20   Jerline Pain, MD  apixaban (ELIQUIS) 5 MG TABS tablet Take 1 tablet (5 mg total) by mouth 2 (two) times daily. 11/04/20   Festus Aloe, MD  Ascorbic Acid (VITAMIN C) 1000 MG tablet Take 1,000 mg by mouth daily.    [provider]  aspirin EC 81 MG EC tablet Take 1 tablet (81 mg total) by mouth daily. 11/16/19   Lorella Nimrod, MD  Cholecalciferol (VITAMIN D3) 250 MCG (10000 UT) capsule Take 10,000 Units by mouth daily with breakfast.     [provider]  Coenzyme Q10 (COQ10 PO) Take 120 mg by mouth daily with breakfast.    [provider]  Continuous Blood Gluc Sensor (FREESTYLE LIBRE 14 DAY SENSOR)  MISC USE TO MONITOR BLOOD GLUCOSE. CHANGE EVERY 14 DAYS. 02/14/19   [provider]  CRANBERRY PO Take 4,200 mg by mouth daily.    [provider]  Cyanocobalamin (VITAMIN B-12) 5000 MCG SUBL Take 5,000 mcg by mouth 2 (two) times a week.    [provider]  diphenhydramine-acetaminophen (TYLENOL PM) 25-500 MG TABS tablet Take 1 tablet by mouth at bedtime as needed (sleep).    [provider]  enoxaparin (LOVENOX) 100 MG/ML injection Inject 100 mg into the skin daily.    [provider]  [place] 565 MG CAPS Take 565 mg by mouth daily with breakfast.    [provider]  hydrALAZINE (APRESOLINE) 50 MG tablet Take 1 tablet by mouth twice daily Patient taking differently: Take 50 mg by mouth in the morning and at bedtime. Breakfast & supper 02/14/20   Jerline Pain, MD  insulin aspart (NOVOLOG) 100 UNIT/ML injection Inject 5-12 Units into the skin 3 (three) times daily before meals. Inject 8-12 units at breakfast, 5-8 units at lunch, 6-8 units at supper, may inject 2-3.5 units at bedtime when needed for high blood sugar    [provider]  insulin glargine (LANTUS) 100 UNIT/ML injection Inject 0.12 mLs (12 Units total) into the skin 2 (two) times daily. Inject 12 units at breakfast and 10 units at bedtime Patient taking differently: Inject 12 Units into the skin See admin instructions. Inject 12 units at breakfast and 12 units at bedtime 11/16/19   Lorella Nimrod, MD  lovastatin (MEVACOR) 20 MG tablet Take 20 mg by mouth daily with breakfast.     [provider]  metoprolol succinate (TOPROL-XL) 25 MG 24 hr tablet Take 1 tablet by mouth once daily Patient taking differently: Take 25  mg by mouth daily. 08/21/20   Jerline Pain, MD  Omega 3-6-9 Fatty Acids (TRIPLE OMEGA-3-6-9 PO) Take 1 capsule by mouth 2 (two) times a week.    [provider]  ramipril (ALTACE) 10 MG capsule Take 1 capsule by mouth twice daily Patient taking  differently: Take 10 mg by mouth 2 (two) times daily. 10/01/20   Jerline Pain, MD  tamsulosin (FLOMAX) 0.4 MG CAPS capsule Take 0.4 mg by mouth at bedtime.    [provider]  tolterodine (DETROL LA) 2 MG 24 hr capsule Take 2 mg by mouth daily.    [provider]  vitamin E 1000 UNIT capsule Take 1,000 Units by mouth every evening.     [provider]  Zinc 50 MG CAPS Take 50 mg by mouth daily with breakfast.     [provider]   No Known Allergies Review of Systems  Unable to perform ROS: Acuity of condition    Physical Exam Vitals and nursing note reviewed.  Constitutional:      General: He is not in acute distress.    Appearance: He is ill-appearing.     Comments: Frail appearing  Pulmonary:     Effort: No respiratory distress.  Skin:    General: Skin is warm and dry.  Neurological:     Mental Status: He is lethargic, disoriented and confused.     Motor: Weakness present.  Psychiatric:        Speech: Speech is slurred.        Behavior: Behavior is slowed.        Cognition and Memory: Cognition is impaired. Memory is impaired.     Vital Signs: BP 139/82 (BP Location: Left Arm)   Pulse 88   Temp 97.6 F (36.4 C)   Resp 19   SpO2 99%  Pain Scale: 0-10   Pain Score: Asleep   SpO2: SpO2: 99 % O2 Device:SpO2: 99 % O2 Flow Rate: .   IO: Intake/output summary: No intake or output data in the 24 hours ending 12/05/20 1543  LBM:   Baseline Weight:   Most recent weight:       Palliative Assessment/Data: PPS 10%     Time In: 1600 Time Out: 1630 Time Total: 30 minutes  Greater than 50%  of this time was spent counseling and coordinating care related to the above assessment and plan.  Signed by: Lin Landsman, NP   Please contact Palliative Medicine Team phone at [phone] for questions and concerns.  For individual provider: See Shea Evans

## 2020-12-05 NOTE — Progress Notes (Signed)
ANTICOAGULATION CONSULT NOTE - Initial Consult  Pharmacy Consult for Heparin (Apixaban on hold) Indication: atrial fibrillation  No Known Allergies  Vital Signs: BP: 148/92 (01/19 0445) Pulse Rate: 99 (01/19 0445)  Labs: Recent Labs    12-21-20 2132 December 21, 2020 2152 December 21, 2020 2153 12/05/20 0200  HGB 11.8* 12.6* 12.2*  --   HCT 40.5 37.0* 36.0*  --   PLT 459*  --   --   --   APTT 28  --   --   --   LABPROT 15.4*  --   --   --   INR 1.3*  --   --   --   CREATININE 2.98* 2.10*  --  2.08*  CKTOTAL 547*  --   --   --     Estimated Creatinine Clearance: 24 mL/min (A) (by C-G formula based on SCr of 2.08 mg/dL (H)).   Medical History: Past Medical History:  Diagnosis Date  . Arthritis    left thumb  . Broken neck ([place])    in March 2015  . Cataract   . Colon cancer ([place])   . Colon polyp   . Diabetes mellitus    Type 1  since 1974  . Dysrhythmia    a fib  . Gout   . Hyperlipidemia   . Hypertension   . Lipoma of back   . Paroxysmal atrial fibrillation ([facility])   . Stroke ([place])    11/14/19  no defecit     Assessment: Heparin for afib while Apixaban is on hold. Last Apixaban dose >12 hours ago, ok to start heparin now. Anticipate using aPTT to dose for now. Hgb 12.2. Noted renal dysfunction.   Goal of Therapy:  Heparin level 0.3-0.7 units/ml aPTT 66-102 seconds Monitor platelets by anticoagulation protocol: Yes   Plan:  Start heparin drip at 900 units/hr 1500 aPTT/heparin level Daily CBC/heparin level/aPTT Monitor for bleeding  Narda Bonds, PharmD, BCPS Clinical Pharmacist Phone: [phone]

## 2020-12-05 NOTE — Progress Notes (Signed)
Inpatient Diabetes Program Recommendations  AACE/ADA: New Consensus Statement on Inpatient Glycemic Control (2015)  Target Ranges:  Prepandial:   less than 140 mg/dL      Peak postprandial:   less than 180 mg/dL (1-2 hours)      Critically ill patients:  140 - 180 mg/dL   Lab Results  Component Value Date   GLUCAP 173 (H) 12/05/2020   HGBA1C 7.5 (H) 12/05/2020    Review of Glycemic Control  Diabetes history: DM1 Outpatient Diabetes medications: Lantus 12 units am + 10 units pm + Novolog meal coverage 8-12 units ac breakfast, 5-8 units ac lunch, 6-8 units ac supper , 2-3.5 units hs snack as needed Current orders for Inpatient glycemic control: IV insulin  Inpatient Diabetes Program Recommendations:   Received consult regarding diabetes management and will follow during hospitalization. Patient sees Dr. Reynold Bowen as his endocrinologist and has type 1 diabetes.  Thank you, Nani Gasser. Reeves Musick, RN, MSN, CDE  Diabetes Coordinator Inpatient Glycemic Control Team Team Pager [phone] (8am-5pm) 12/05/2020 1:49 PM

## 2020-12-05 NOTE — ED Notes (Signed)
Pt not allowing staff to take blood sugar. Pt is becoming combative and swinging at staff.

## 2020-12-05 NOTE — ED Notes (Signed)
Pt unable to lay on table and straighten legs or lay still. MD paged.

## 2020-12-05 NOTE — ED Notes (Signed)
Spoke to pt daughter and this RN provided updated on pt care.

## 2020-12-05 NOTE — ED Notes (Signed)
Full bed change complete. Placed condom bag on pt.

## 2020-12-05 NOTE — Progress Notes (Addendum)
PROGRESS NOTE  Curtis Dunn [id number] DOB: 17-Dec-1940 DOA: 11/19/2020 PCP: Reynold Bowen, MD  HPI/Recap of past 24 hours: Curtis Dunn is a 80 y.o. male with history of atrial fibrillation and prior stroke in December 2020 who was recently diagnosed with bladder cancer underwent TURB last month with history of colon cancer status post hemicolectomy diabetes mellitus type 2 was not responding to patient's daughter's phone calls at home and patient's daughter called him family and checked on the patient was found to be on the bed confused.  Multiple pills were found on the floor.  Not sure if he was taking it for last few days.  Patient was brought to the ER.  As per the patient's daughter patient's mental status has been declining over the last 2 months has been getting more confused agitated and was not sure if he was getting dementia.  Patient also has a follow-up with oncologist next week for possible chemotherapy discussion.  ED Course: In the ER patient was confused and was in A. fib with RVR.  Labs were showing signs of DKA with blood sugar of 890 with bicarb of 7 anion gap of 31 creatinine increased from 1.6 and it is around 2.9 WBC count is 18.4 chest x-ray unremarkable UA is unremarkable.  Patient was started on fluid bolus and IV insulin for DKA.  CT head is unremarkable.  On my exam patient still confused.  For the A. fib with RVR I ordered 1 dose of Cardizem bolus 10 mg and Patient was getting IV metoprolol.  12/05/20: Patient was seen and examined in the ED.  He is obtunded but appears to be protecting his airway.  Ongoing treatment for DKA type I.  Appreciate diabetes coordinator's assistance.  Assessment/Plan: Principal Problem:   DKA (diabetic ketoacidosis) ([place]) Active Problems:   Essential hypertension   PAF (paroxysmal atrial fibrillation) (H[facility])   AKI (acute kidney injury) ([place])  DKA type I Suspect the patient was not taking his insulin Presented with  markedly elevated serum glucose, pH 7.1 and PCO2 of 28 on VBG Serum bicarb on presentation 7 with potassium of 6.7 and creatinine of 2.98 with baseline creatinine of 1.6 (GFR 43) Hemoglobin A1c 7.5 on 12/05/2020 Ongoing management of DKA type 1 Appreciate diabetes coordinator's assistance  Acute toxic metabolic encephalopathy in the setting of DKA, type I. Obtunded on presentation CT head showing no acute intracranial findings Patient was not able to tolerate MRI brain, could not lay still On presentation VBG with pH of 7.1 and PCO2 of 28 in the setting of severe metabolic acidosis with a serum bicarb of 7. Repeat VBG on 12/05/2020, closely follow results Closely monitor on stepdown unit Aspiration, fall and delirium precautions  Elevated troponin, suspect demand ischemia in the setting of severe metabolic acidosis and AKI Troponin S 66 Trend troponin as the patient is not able to provide a history Obtain twelve-lead EKG  Prolonged QTC seen on twelve-lead EKG done early morning of 12/05/2020 QTC 612 Avoid QTC prolonging agents Repeat twelve-lead EKG Closely monitor on telemetry Optimize serum potassium and serum magnesium  Presumptive UTI UA positive for pyuria Follow urine culture Started on Rocephin empirically  Leukocytosis, could be reactive however need to rule out sepsis Repeat CBC with differential Obtain procalcitonin lactic acid level Started on Rocephin empirically Continue IV fluid hydration Monitor blood cultures and urine culture  AKI on CKD 3B likely prerenal in the setting of dehydration from poor oral intake Baseline creatinine appears to be  1.6 with GFR 43 Presented with creatinine of 2.98 Will need strict I's and O's, patient has a diaper in place Continue IV fluid hydration Creatinine is downtrending Continue to monitor renal function  Resolved hyperkalemia Presented with serum potassium of 6.7, treated Serum potassium 3.7.  Resolved hypovolemic  hyponatremia Presented with serum sodium of 134 Serum sodium 149 Closely monitor serum sodium to avoid worsening hypernatremia He is currently on D5 LR IV fluid hydration  Elevated CPK Continue IV fluid hydration Trend CPK level  Chronic A. fib with RVR, rate is currently controlled Hold Eliquis on hold due to n.p.o. Continue heparin drip He is on IV metoprolol scheduled Continue to closely monitor vital signs  History of prior CVA Patient is n.p.o. at this time due to his obtundation  He is on aspirin and lovastatin at home which will be resumed once more alert   Code Status: DNR  Family Communication: None at bedside.  Disposition Plan: Likely will discharge to home with home health services versus SNF   Consultants:  None.  Procedures:  None.  Antimicrobials:  Rocephin started on 12/05/2020  DVT prophylaxis: Heparin drip  Status is: Inpatient    Dispo:  Patient From: Home  Planned Disposition: Home with Health Care Svc  Expected discharge date: 12/07/2020  Medically stable for discharge: No, ongoing management of DKA type I and acute toxic encephalopathy.         Objective: Vitals:   12/05/20 0900 12/05/20 0950 12/05/20 1152 12/05/20 1336  BP: (!) 142/80 124/82 (!) 147/90 139/82  Pulse: 97 (!) 115 91 88  Resp: 15 (!) 27 20 19   Temp:    97.6 F (36.4 C)  TempSrc:      SpO2: 100% 100% 97% 99%   No intake or output data in the 24 hours ending 12/05/20 1502 There were no vitals filed for this visit.  Exam:  . General: 80 y.o. year-old male well developed well nourished in no acute distress.  Obtunded.  Responds to painful stimuli. . Cardiovascular: Regular rate and rhythm with no rubs or gallops.  No thyromegaly or JVD noted.   [name] Respiratory: Clear to auscultation with no wheezes or rales. Good inspiratory effort. . Abdomen: Soft nontender nondistended with normal bowel sounds x4 quadrants. . Musculoskeletal: No lower extremity edema  bilaterally.   . Skin: No ulcerative lesions noted or rashes. . Psychiatry: Unable to assess mood due to obtundation.   Data Reviewed: CBC: Recent Labs  Lab 01-03-21 2132 Jan 03, 2021 2152 2021/01/03 2153  WBC 18.4*  --   --   NEUTROABS 17.0*  --   --   HGB 11.8* 12.6* 12.2*  HCT 40.5 37.0* 36.0*  MCV 104.4*  --   --   PLT 459*  --   --    Basic Metabolic Panel: Recent Labs  Lab 2021/01/03 2132 03-Jan-2021 2152 01/03/21 2153 12/05/20 0200 12/05/20 1134  NA 136 134* 134* 138 149*  K 6.7* 6.6* 6.6* 4.7 3.7  CL 98 106  --  106 113*  CO2 7*  --   --  8* 24  GLUCOSE 819* >700*  --  464* 190*  BUN 80* 78*  --  60* 58*  CREATININE 2.98* 2.10*  --  2.08* 1.78*  CALCIUM 11.0*  --   --  9.0 11.5*   GFR: CrCl cannot be calculated (Unknown ideal weight.). Liver Function Tests: Recent Labs  Lab 01/03/2021 2132  AST 45*  ALT 38  ALKPHOS 125  BILITOT 1.9*  PROT 6.9  ALBUMIN 3.5   No results for input(s): LIPASE, AMYLASE in the last 168 hours. Recent Labs  Lab 12/05/20 1134  AMMONIA 32   Coagulation Profile: Recent Labs  Lab 15-Dec-2020 2132  INR 1.3*   Cardiac Enzymes: Recent Labs  Lab 12-15-2020 2132 12/05/20 1134  CKTOTAL 547* 624*   BNP (last 3 results) No results for input(s): PROBNP in the last 8760 hours. HbA1C: Recent Labs    12/05/20 0200  HGBA1C 7.5*   CBG: Recent Labs  Lab 12/05/20 0754 12/05/20 0905 12/05/20 1013 12/05/20 1123 12/05/20 1244  GLUCAP 287* 212* 233* 203* 173*   Lipid Profile: No results for input(s): CHOL, HDL, LDLCALC, TRIG, CHOLHDL, LDLDIRECT in the last 72 hours. Thyroid Function Tests: Recent Labs    12/05/20 1134  TSH 4.913*   Anemia Panel: Recent Labs    12/05/20 1134  VITAMINB12 3,252*   Urine analysis:    Component Value Date/Time   COLORURINE YELLOW 12/05/2020 1002   APPEARANCEUR TURBID (A) 12/05/2020 1002   LABSPEC 1.017 12/05/2020 1002   PHURINE 5.0 12/05/2020 1002   GLUCOSEU >=500 (A) 12/05/2020 1002    HGBUR MODERATE (A) 12/05/2020 1002   BILIRUBINUR NEGATIVE 12/05/2020 1002   KETONESUR 20 (A) 12/05/2020 1002   PROTEINUR 100 (A) 12/05/2020 1002   UROBILINOGEN 1.0 02/12/2014 2348   NITRITE NEGATIVE 12/05/2020 1002   LEUKOCYTESUR LARGE (A) 12/05/2020 1002   Sepsis Labs: @LABRCNTIP (procalcitonin:4,lacticidven:4)  ) Recent Results (from the past 240 hour(s))  Resp Panel by RT-PCR (Flu A&B, Covid) Nasopharyngeal Swab     Status: None   Collection Time: 2020-12-15  8:59 PM   Specimen: Nasopharyngeal Swab; Nasopharyngeal(NP) swabs in vial transport medium  Result Value Ref Range Status   SARS Coronavirus 2 by RT PCR NEGATIVE NEGATIVE Final    Comment: (NOTE) SARS-CoV-2 target nucleic acids are NOT DETECTED.  The SARS-CoV-2 RNA is generally detectable in upper respiratory specimens during the acute phase of infection. The lowest concentration of SARS-CoV-2 viral copies this assay can detect is 138 copies/mL. A negative result does not preclude SARS-Cov-2 infection and should not be used as the sole basis for treatment or other patient management decisions. A negative result may occur with  improper specimen collection/handling, submission of specimen other than nasopharyngeal swab, presence of viral mutation(s) within the areas targeted by this assay, and inadequate number of viral copies(<138 copies/mL). A negative result must be combined with clinical observations, patient history, and epidemiological information. The expected result is Negative.  Fact Sheet for Patients:  EntrepreneurPulse.com.au  Fact Sheet for Healthcare Providers:  IncredibleEmployment.be  This test is no t yet approved or cleared by the [place] FDA and  has been authorized for detection and/or diagnosis of SARS-CoV-2 by FDA under an Emergency Use Authorization (EUA). This EUA will remain  in effect (meaning this test can be used) for the duration of the COVID-19  declaration under Section 564(b)(1) of the Act, 21 U.S.C.section 360bbb-3(b)(1), unless the authorization is terminated  or revoked sooner.       Influenza A by PCR NEGATIVE NEGATIVE Final   Influenza B by PCR NEGATIVE NEGATIVE Final    Comment: (NOTE) The Xpert Xpress SARS-CoV-2/FLU/RSV plus assay is intended as an aid in the diagnosis of influenza from Nasopharyngeal swab specimens and should not be used as a sole basis for treatment. Nasal washings and aspirates are unacceptable for Xpert Xpress SARS-CoV-2/FLU/RSV testing.  Fact Sheet for Patients: EntrepreneurPulse.com.au  Fact Sheet for Healthcare Providers: IncredibleEmployment.be  This test is not yet approved or cleared by the [place] and has been authorized for detection and/or diagnosis of SARS-CoV-2 by FDA under an Emergency Use Authorization (EUA). This EUA will remain in effect (meaning this test can be used) for the duration of the COVID-19 declaration under Section 564(b)(1) of the Act, 21 U.S.C. section 360bbb-3(b)(1), unless the authorization is terminated or revoked.  Performed at [facility], [place] [address] [place], [place] [address]   Blood culture (routine single)     Status: None (Preliminary result)   Collection Time: 12/14/2020  9:39 PM   Specimen: BLOOD  Result Value Ref Range Status   Specimen Description BLOOD RIGHT ARM  Final   Special Requests   Final    BOTTLES DRAWN AEROBIC AND ANAEROBIC Blood Culture adequate volume   Culture   Final    NO GROWTH < 12 HOURS Performed at [facility], [place] [address], [place], [place] [address]    Report Status PENDING  Incomplete      Studies: DG Pelvis 1-2 Views  Result Date: 12/05/2020 CLINICAL DATA:  Fall. EXAM: PELVIS - 1-2 VIEW COMPARISON:  12/25/2015. FINDINGS: Diffuse osteopenia. Degenerative changes lumbar spine and both hips. No acute bony or joint abnormality. No evidence of fracture  dislocation. Pelvic calcifications consistent with phleboliths. IMPRESSION: Diffuse osteopenia. Degenerative changes lumbar spine and both hips. No acute abnormality. Electronically Signed   By: Marcello Moores  Register   On: 12/05/2020 07:50   CT Head Wo Contrast  Result Date: 11/22/2020 CLINICAL DATA:  Mental status change EXAM: CT HEAD WITHOUT CONTRAST TECHNIQUE: Contiguous axial images were obtained from the base of the skull through the vertex without intravenous contrast. COMPARISON:  CT 11/14/2019, MRI 11/15/2019 CT 04/12/2014 FINDINGS: Brain: No acute territorial infarction, hemorrhage, or intracranial mass is visualized. Moderate atrophy. Chronic infarcts within the right cerebellum, left occipital lobe and right operculum. Stable ventricle size. Vascular: No hyperdense vessels.  Carotid vascular calcification Skull: No acute fracture Sinuses/Orbits: No acute finding. Mucosal thickening or retention cyst in the right maxillary sinus. Other: Chronic fracture deformity of C1 with chronic malalignment of the dens and clivus. IMPRESSION: 1. No CT evidence for acute intracranial abnormality. 2. Atrophy and multifocal chronic infarcts. Electronically Signed   By: Donavan Foil M.D.   On: 11/20/2020 22:03   DG Chest Port 1 View  Result Date: 12/07/2020 CLINICAL DATA:  Altered mental status and hyperglycemia. EXAM: PORTABLE CHEST 1 VIEW COMPARISON:  December 29, 2015 FINDINGS: The heart size and mediastinal contours are within normal limits. Both lungs are clear. Multiple chronic left-sided rib fractures are seen. A chronic seventh right rib fracture is also seen. IMPRESSION: No active disease. Electronically Signed   By: Virgina Norfolk M.D.   On: 11/20/2020 21:15    Scheduled Meds: . insulin glargine  5 Units Subcutaneous Daily  . metoprolol tartrate  2.5 mg Intravenous Q6H    Continuous Infusions: . cefTRIAXone (ROCEPHIN)  IV 1 g (12/05/20 1443)  . dextrose 5% lactated ringers 100 mL/hr at 12/05/20  1434  . heparin    . insulin 1.5 Units/hr (12/05/20 1418)     LOS: 1 day     Kayleen Memos, MD [facility] Pager [phone]  If 7PM-7AM, please contact night-coverage [email or web address] Password [facility] 12/05/2020, 3:02 PM

## 2020-12-05 NOTE — ED Notes (Signed)
Unable to do MRi d/t pt being unable to lay still. MD made aware and told this RN to D/C MRi. Pt returned to room and is on monitors.

## 2020-12-06 DIAGNOSIS — Z7189 Other specified counseling: Secondary | ICD-10-CM

## 2020-12-06 DIAGNOSIS — E1010 Type 1 diabetes mellitus with ketoacidosis without coma: Secondary | ICD-10-CM | POA: Diagnosis not present

## 2020-12-06 DIAGNOSIS — Z515 Encounter for palliative care: Secondary | ICD-10-CM | POA: Diagnosis not present

## 2020-12-06 DIAGNOSIS — N179 Acute kidney failure, unspecified: Secondary | ICD-10-CM | POA: Diagnosis not present

## 2020-12-06 DIAGNOSIS — R451 Restlessness and agitation: Secondary | ICD-10-CM

## 2020-12-06 DIAGNOSIS — I480 Paroxysmal atrial fibrillation: Secondary | ICD-10-CM | POA: Diagnosis not present

## 2020-12-06 LAB — LACTIC ACID, PLASMA
Lactic Acid, Venous: 2.3 mmol/L (ref 0.5–1.9)
Lactic Acid, Venous: 2.1 mmol/L (ref 0.5–1.9)

## 2020-12-06 LAB — CBC WITH DIFFERENTIAL/PLATELET
Abs Immature Granulocytes: 0.07 10*3/uL (ref 0.00–0.07)
Basophils Absolute: 0 10*3/uL (ref 0.0–0.1)
Basophils Relative: 0 %
Eosinophils Absolute: 0 10*3/uL (ref 0.0–0.5)
Eosinophils Relative: 0 %
HCT: 33.5 % — ABNORMAL LOW (ref 39.0–52.0)
Hemoglobin: 11.2 g/dL — ABNORMAL LOW (ref 13.0–17.0)
Immature Granulocytes: 1 %
Lymphocytes Relative: 4 %
Lymphs Abs: 0.6 10*3/uL — ABNORMAL LOW (ref 0.7–4.0)
MCH: 31.3 pg (ref 26.0–34.0)
MCHC: 33.4 g/dL (ref 30.0–36.0)
MCV: 93.6 fL (ref 80.0–100.0)
Monocytes Absolute: 0.6 10*3/uL (ref 0.1–1.0)
Monocytes Relative: 4 %
Neutro Abs: 14 10*3/uL — ABNORMAL HIGH (ref 1.7–7.7)
Neutrophils Relative %: 91 %
Platelets: 402 10*3/uL — ABNORMAL HIGH (ref 150–400)
RBC: 3.58 MIL/uL — ABNORMAL LOW (ref 4.22–5.81)
RDW: 13.7 % (ref 11.5–15.5)
WBC: 15.3 10*3/uL — ABNORMAL HIGH (ref 4.0–10.5)
nRBC: 0 % (ref 0.0–0.2)

## 2020-12-06 LAB — GLUCOSE, CAPILLARY
Glucose-Capillary: 295 mg/dL — ABNORMAL HIGH (ref 70–99)
Glucose-Capillary: 392 mg/dL — ABNORMAL HIGH (ref 70–99)
Glucose-Capillary: 460 mg/dL — ABNORMAL HIGH (ref 70–99)
Glucose-Capillary: 408 mg/dL — ABNORMAL HIGH (ref 70–99)

## 2020-12-06 LAB — COMPREHENSIVE METABOLIC PANEL
ALT: 40 U/L (ref 0–44)
AST: 52 U/L — ABNORMAL HIGH (ref 15–41)
Albumin: 2.7 g/dL — ABNORMAL LOW (ref 3.5–5.0)
Alkaline Phosphatase: 107 U/L (ref 38–126)
Anion gap: 13 (ref 5–15)
BUN: 41 mg/dL — ABNORMAL HIGH (ref 8–23)
CO2: 26 mmol/L (ref 22–32)
Calcium: 11.5 mg/dL — ABNORMAL HIGH (ref 8.9–10.3)
Chloride: 110 mmol/L (ref 98–111)
Creatinine, Ser: 1.53 mg/dL — ABNORMAL HIGH (ref 0.61–1.24)
GFR, Estimated: 46 mL/min — ABNORMAL LOW (ref >60)
Glucose, Bld: 400 mg/dL — ABNORMAL HIGH (ref 70–99)
Potassium: 4.4 mmol/L (ref 3.5–5.1)
Sodium: 149 mmol/L — ABNORMAL HIGH (ref 135–145)
Total Bilirubin: 1.3 mg/dL — ABNORMAL HIGH (ref 0.3–1.2)
Total Protein: 5.6 g/dL — ABNORMAL LOW (ref 6.5–8.1)

## 2020-12-06 LAB — HEPARIN LEVEL (UNFRACTIONATED): Heparin Unfractionated: 0.34 IU/mL (ref 0.30–0.70)

## 2020-12-06 LAB — APTT: aPTT: 31 seconds (ref 24–36)

## 2020-12-06 LAB — RPR: RPR Ser Ql: NONREACTIVE

## 2020-12-06 LAB — PROCALCITONIN: Procalcitonin: 1.32 ng/mL

## 2020-12-06 MED ORDER — BIOTENE DRY MOUTH MT LIQD
15.0000 mL | Freq: Two times a day (BID) | OROMUCOSAL | Status: DC
  Administered 2020-12-06 – 2020-12-08 (×4): 15 mL via TOPICAL

## 2020-12-06 MED ORDER — INSULIN GLARGINE 100 UNIT/ML ~~LOC~~ SOLN
7.0000 [IU] | Freq: Two times a day (BID) | SUBCUTANEOUS | Status: DC
  Administered 2020-12-06: 7 [IU] via SUBCUTANEOUS
  Filled 2020-12-06 (×2): qty 0.07

## 2020-12-06 MED ORDER — HALOPERIDOL LACTATE 2 MG/ML PO CONC
0.5000 mg | ORAL | Status: DC | PRN
  Filled 2020-12-06: qty 0.3

## 2020-12-06 MED ORDER — INSULIN ASPART 100 UNIT/ML ~~LOC~~ SOLN: 0.0000 [IU] | Freq: Three times a day (TID) | SUBCUTANEOUS | Status: DC

## 2020-12-06 MED ORDER — LORAZEPAM 1 MG PO TABS: 1.0000 mg | ORAL_TABLET | ORAL | Status: DC | PRN

## 2020-12-06 MED ORDER — HALOPERIDOL 0.5 MG PO TABS
0.5000 mg | ORAL_TABLET | ORAL | Status: DC | PRN
  Filled 2020-12-06: qty 1

## 2020-12-06 MED ORDER — OXYCODONE HCL 5 MG PO TABS
5.0000 mg | ORAL_TABLET | Freq: Four times a day (QID) | ORAL | Status: DC | PRN
Start: 2020-12-06 — End: 2020-12-09
  Administered 2020-12-06: 5 mg via ORAL
  Filled 2020-12-06: qty 1

## 2020-12-06 MED ORDER — POLYVINYL ALCOHOL 1.4 % OP SOLN
1.0000 [drp] | Freq: Four times a day (QID) | OPHTHALMIC | Status: DC | PRN
  Filled 2020-12-06: qty 15

## 2020-12-06 MED ORDER — INSULIN ASPART 100 UNIT/ML ~~LOC~~ SOLN: 7.0000 [IU] | Freq: Three times a day (TID) | SUBCUTANEOUS | Status: DC

## 2020-12-06 MED ORDER — INSULIN ASPART 100 UNIT/ML ~~LOC~~ SOLN: 0.0000 [IU] | Freq: Every day | SUBCUTANEOUS | Status: DC

## 2020-12-06 MED ORDER — ONDANSETRON HCL 4 MG/2ML IJ SOLN: 4.0000 mg | Freq: Four times a day (QID) | INTRAMUSCULAR | Status: DC | PRN

## 2020-12-06 MED ORDER — MORPHINE SULFATE (PF) 2 MG/ML IV SOLN
2.0000 mg | INTRAVENOUS | Status: DC | PRN
  Administered 2020-12-06 – 2020-12-08 (×7): 2 mg via INTRAVENOUS
  Filled 2020-12-06 (×7): qty 1

## 2020-12-06 MED ORDER — INSULIN ASPART 100 UNIT/ML ~~LOC~~ SOLN
20.0000 [IU] | Freq: Once | SUBCUTANEOUS | Status: AC
  Administered 2020-12-06: 20 [IU] via SUBCUTANEOUS

## 2020-12-06 MED ORDER — ONDANSETRON 4 MG PO TBDP: 4.0000 mg | ORAL_TABLET | Freq: Four times a day (QID) | ORAL | Status: DC | PRN

## 2020-12-06 MED ORDER — SENNA 8.6 MG PO TABS
1.0000 | ORAL_TABLET | Freq: Every day | ORAL | Status: DC
  Administered 2020-12-06: 8.6 mg via ORAL
  Filled 2020-12-06: qty 1

## 2020-12-06 MED ORDER — INSULIN GLARGINE 100 UNIT/ML ~~LOC~~ SOLN
10.0000 [IU] | Freq: Two times a day (BID) | SUBCUTANEOUS | Status: DC
  Filled 2020-12-06: qty 0.1

## 2020-12-06 MED ORDER — GLYCOPYRROLATE 0.2 MG/ML IJ SOLN: 0.2000 mg | INTRAMUSCULAR | Status: DC | PRN

## 2020-12-06 MED ORDER — OXYCODONE HCL 5 MG PO TABS: 5.0000 mg | ORAL_TABLET | ORAL | Status: DC | PRN

## 2020-12-06 MED ORDER — LORAZEPAM 2 MG/ML PO CONC: 1.0000 mg | ORAL | Status: DC | PRN

## 2020-12-06 MED ORDER — ACETAMINOPHEN 650 MG RE SUPP: 650.0000 mg | Freq: Four times a day (QID) | RECTAL | Status: DC | PRN

## 2020-12-06 MED ORDER — INSULIN ASPART 100 UNIT/ML ~~LOC~~ SOLN: 4.0000 [IU] | Freq: Three times a day (TID) | SUBCUTANEOUS | Status: DC

## 2020-12-06 MED ORDER — GLYCOPYRROLATE 1 MG PO TABS
1.0000 mg | ORAL_TABLET | ORAL | Status: DC | PRN
  Filled 2020-12-06: qty 1

## 2020-12-06 MED ORDER — POTASSIUM CHLORIDE 2 MEQ/ML IV SOLN
INTRAVENOUS | Status: DC
  Filled 2020-12-06: qty 1000

## 2020-12-06 MED ORDER — ACETAMINOPHEN 325 MG PO TABS: 650.0000 mg | ORAL_TABLET | Freq: Four times a day (QID) | ORAL | Status: DC | PRN

## 2020-12-06 MED ORDER — HALOPERIDOL LACTATE 5 MG/ML IJ SOLN: 0.5000 mg | INTRAMUSCULAR | Status: DC | PRN

## 2020-12-06 MED ORDER — LORAZEPAM 2 MG/ML IJ SOLN
1.0000 mg | INTRAMUSCULAR | Status: DC | PRN
  Administered 2020-12-07: 1 mg via INTRAVENOUS
  Filled 2020-12-06: qty 1

## 2020-12-06 MED ORDER — LORAZEPAM 2 MG/ML IJ SOLN
0.5000 mg | INTRAMUSCULAR | Status: DC
  Administered 2020-12-06 – 2020-12-08 (×12): 0.5 mg via INTRAVENOUS
  Filled 2020-12-06 (×12): qty 1

## 2020-12-06 NOTE — Plan of Care (Signed)

## 2020-12-06 NOTE — Progress Notes (Signed)
CRITICAL VALUE ALERT  Critical Value:  408  Date & Time Notied:  12/06/20 1235  Provider Notified: Aileen Fass DO  Orders Received/Actions taken: Increase insulin

## 2020-12-06 NOTE — Evaluation (Signed)
Clinical/Bedside Swallow Evaluation Patient Details  Name: Curtis Dunn MRN: [id number] Date of Birth: December 12, 1940  Today's Date: 12/06/2020 Time: SLP Start Time (ACUTE ONLY): 9147 SLP Stop Time (ACUTE ONLY): 0940 SLP Time Calculation (min) (ACUTE ONLY): 15 min  Past Medical History:  Past Medical History:  Diagnosis Date  . Arthritis    left thumb  . Broken neck ([place])    in March 2015  . Cataract   . Colon cancer ([place])   . Colon polyp   . Diabetes mellitus    Type 1  since 1974  . Dysrhythmia    a fib  . Gout   . Hyperlipidemia   . Hypertension   . Lipoma of back   . Paroxysmal atrial fibrillation ([facility])   . Stroke [facility])    11/14/19  no defecit    Past Surgical History:  Past Surgical History:  Procedure Laterality Date  . ANKLE SURGERY     right  . APPENDECTOMY  2001  . CATARACT EXTRACTION EXTRACAPSULAR Bilateral   . COLON RESECTION    . ELBOW FRACTURE SURGERY    . HERNIA REPAIR     umbilica and left inguinal  . INGUINAL HERNIA REPAIR Left 03/13/2016   Procedure: LEFT INGUINAL HERNIA REPAIR WITH UNBILICAL HERNIA REPAIR;  Surgeon: [name], MD;  Location: [place];  Service: General;  Laterality: Left;  . INSERTION OF MESH Left 03/13/2016   Procedure: INSERTION OF MESH;  Surgeon: [name], MD;  Location: [place];  Service: General;  Laterality: Left;  [name] MASS EXCISION N/A 07/20/2019   Procedure: EXCISION OF BACK MASS;  Surgeon: Rolm Bookbinder, MD;  Location: [place];  Service: General;  Laterality: N/A;  MAC AND LOCAL  . ORIF PATELLA Left 03/16/2020   Procedure: [name];  Surgeon: Sydnee Cabal, MD;  Location: [facility];  Service: Orthopedics;  Laterality: Left;  femoral nerve block vs general  . SHOULDER SURGERY Right    right shoulder surgery  rmoval of tendon  . TRANSURETHRAL RESECTION OF BLADDER TUMOR N/A 11/02/2020   Procedure: TRANSURETHRAL RESECTION OF BLADDER TUMOR (TURBT) GREATER THAN 5CM;  Surgeon: Festus Aloe, MD;   Location: [facility];  Service: Urology;  Laterality: N/A;  GENERAL W/ PARALYSIS  . VASECTOMY     HPI:  Pt is a 80 y.o. male with history of atrial fibrillation and prior stroke in December 2020 who was recently diagnosed with bladder cancer underwent TURB last month with history of colon cancer status post hemicolectomy diabetes mellitus type 2 who was admitted after being found confused.  CT head and CXR were negative.   Assessment / Plan / Recommendation Clinical Impression  Pt was seen for bedside swallow evaluation. He did not present as a reliable historian considering how unrelated his responses were to the questions. Pt did not follow commands during the assessment and a complete oral mechanism exam therefore could not be conducted. Dentition was adequate. He was seen during part of breakfast with a sausage patty, pancakes, oatmeal, applesauce, and thin liquids via straw. He exhibited moderately prolonged mastication with regular texture solids and inconsistent coughing with dual consistency boluses (i.e., thin oatmeal with milk). A dysphagia 3 diet with thin liquids is recommended at this time. SLP will follow to assess diet tolerance and to determine whether instrumental assessment is warranted. SLP Visit Diagnosis: Dysphagia, unspecified (R13.10)    Aspiration Risk  Mild aspiration risk    Diet Recommendation Dysphagia 3 (Mech soft);Thin liquid   Liquid Administration via:  Cup;Straw Medication Administration: Whole meds with puree (crush larger meds) Supervision: Staff to assist with self feeding Compensations: Slow rate;Small sips/bites;Minimize environmental distractions Postural Changes: Seated upright at 90 degrees    Other  Recommendations Oral Care Recommendations: Oral care BID   Follow up Recommendations  (TBD)      Frequency and Duration min 2x/week  1 week       Prognosis Prognosis for Safe Diet Advancement: Good Barriers to Reach Goals: Cognitive deficits       Swallow Study   General Date of Onset: 12/05/20 HPI: Pt is a 80 y.o. male with history of atrial fibrillation and prior stroke in December 2020 who was recently diagnosed with bladder cancer underwent TURB last month with history of colon cancer status post hemicolectomy diabetes mellitus type 2 who was admitted after being found confused.  CT head and CXR were negative. Type of Study: Bedside Swallow Evaluation Previous Swallow Assessment: NOne Diet Prior to this Study: Regular;Thin liquids Temperature Spikes Noted: No Respiratory Status: Room air History of Recent Intubation: No Behavior/Cognition: Alert;Cooperative;Pleasant mood Oral Cavity Assessment: Within Functional Limits Oral Care Completed by SLP: No Oral Cavity - Dentition: Adequate natural dentition Vision: Functional for self-feeding Self-Feeding Abilities: Total assist Patient Positioning: Upright in bed Baseline Vocal Quality: Normal Volitional Cough: Weak Volitional Swallow: Able to elicit    Oral/Motor/Sensory Function Overall Oral Motor/Sensory Function:  (Pt did not follow commands consistently enough to participate)   Ice Chips Ice chips: Not tested   Thin Liquid Thin Liquid: Within functional limits Presentation: Straw    Nectar Thick Nectar Thick Liquid: Not tested   Honey Thick Honey Thick Liquid: Not tested   Puree Puree: Within functional limits Presentation: Spoon   Solid     Solid: Impaired Presentation: Spoon Oral Phase Impairments: Impaired mastication Oral Phase Functional Implications: Impaired mastication     Curtis Dunn I. Curtis Dunn, [place], [place] Office number [phone] Pager [phone]   Curtis Dunn 12/06/2020,9:46 AM

## 2020-12-06 NOTE — Progress Notes (Signed)
Inpatient Diabetes Program Recommendations  AACE/ADA: New Consensus Statement on Inpatient Glycemic Control (2015)  Target Ranges:  Prepandial:   less than 140 mg/dL      Peak postprandial:   less than 180 mg/dL (1-2 hours)      Critically ill patients:  140 - 180 mg/dL   Lab Results  Component Value Date   GLUCAP 392 (H) 12/06/2020   HGBA1C 7.5 (H) 12/05/2020    Review of Glycemic Control Results for Curtis Dunn, Curtis Dunn" (MRN [id number]) as of 12/06/2020 09:57  Ref. Range 12/05/2020 23:03 12/06/2020 03:16 12/06/2020 08:30  Glucose-Capillary Latest Ref Range: 70 - 99 mg/dL 218 (H) 295 (H) 392 (H)   Diabetes history: DM1 Outpatient Diabetes medications: Lantus 12 units am + 10 units pm + Novolog meal coverage 8-12 units ac breakfast, 5-8 units ac lunch, 6-8 units ac supper , 2-3.5 units hs snack as needed Current orders for Inpatient glycemic control: Novolog 0-9 units TID, Novolog 0-5 units QHS, Lantus 7 units BID  Inpatient Diabetes Program Recommendations:    Noted increase in glucose trends following transition. Due to BID dosing of basal, most likely was not enough.   Consider: -Further increasing Lantus to 9 units BID -Adding Novolog 4 units TID (assuming patient is consuming >50% of meals)  Thanks, Bronson Curb, MSN, RNC-OB Diabetes Coordinator [phone] (8a-5p)

## 2020-12-06 NOTE — Progress Notes (Signed)
Pt moaning out and having facial grimacing. Asked pt if he was in pain and pt say yes but unable to describe or let me know where. MD made aware and new orders received.

## 2020-12-06 NOTE — Evaluation (Signed)
Occupational Therapy Evaluation Patient Details Name: Curtis Dunn MRN: [id number] DOB: 1941-01-27 Today's Date: 12/06/2020    History of Present Illness  80 yo male presenting to ED via EMS due to confusion. Admitted 11/27/2020 with DKA, acute metabolic encephalopathy, AKI on CKD, atrial fibrillation with RVR. PMH including atrial fibrillation, stroke in December 2020, recent diagnosis of bladder cancer s/p TURB last month, colon cancer s/p hemicolectomy, DM, and HTN.   Clinical Impression   Per chart review, pt was living alone and was independent; pt unable to provide information due to confusion and poor orientation. Pt currently requiring Max A for ADLs and Mod A for functional mobility due to poor cognition and balance. Pt keeping his eyes closed for majority of session. Pt following simple commands to wash his face at bed level, but otherwise not following simple commands. Pt becoming quickly restless and repeating that he needs to leave. Pt would benefit from further acute OT to facilitate safe dc. Pending pt progress and support, recommend dc to SNF for further OT to optimize safety, independence with ADLs, and return to PLOF.   HR 80s. SpO2 99-97% on RA. BP supine 179/62 (103); standing 199/92 (133); supine after mobility 192/96 (123).  Notified RN    Follow Up Recommendations  SNF (Pending pt progress)    Equipment Recommendations  Other (comment) (Defer to next venue and pending information on home set up)    Recommendations for Other Services PT consult;Speech consult     Precautions / Restrictions Precautions Precautions: Fall      Mobility Bed Mobility Overal bed mobility: Needs Assistance Bed Mobility: Supine to Sit;Sit to Supine     Supine to sit: Min assist Sit to supine: Max assist   General bed mobility comments: Min A to elevate trunk into upright posture. Max A for returning to supine; managing BLEs    Transfers Overall transfer level: Needs  assistance Equipment used: 1 person hand held assist Transfers: Sit to/from Stand Sit to Stand: Min assist;Mod assist         General transfer comment: Min-Mod A for gaining balance in standing. Pt with good power up from EOB. Pt performing three sit<>Stand from EOB stating "I need to get home"    Balance Overall balance assessment: Needs assistance Sitting-balance support: No upper extremity supported;Feet supported Sitting balance-Leahy Scale: Fair     Standing balance support: Single extremity supported;During functional activity Standing balance-Leahy Scale: Poor                             ADL either performed or assessed with clinical judgement   ADL Overall ADL's : Needs assistance/impaired   Eating/Feeding Details (indicate cue type and reason): Pt drinking water when sitting upright; spilling on himself and needing cues for slow, small spits Grooming: Wash/dry face;Oral care;Bed level;Min guard;Maximal assistance Grooming Details (indicate cue type and reason): Pt washing his face with MIn cues at bed level. Unable to perform oral care without MAx A due to confusion                             Functional mobility during ADLs: Moderate assistance;Maximal assistance General ADL Comments: Max A for ADLs due to confusion     Vision         Perception     Praxis      Pertinent Vitals/Pain Pain Assessment: Faces Faces Pain Scale: No  hurt Pain Intervention(s): Monitored during session     Hand Dominance     Extremity/Trunk Assessment Upper Extremity Assessment Upper Extremity Assessment: Overall WFL for tasks assessed   Lower Extremity Assessment Lower Extremity Assessment: Defer to PT evaluation   Cervical / Trunk Assessment Cervical / Trunk Assessment: Kyphotic   Communication Communication Communication: HOH   Cognition Arousal/Alertness: Lethargic Behavior During Therapy: Flat affect;Restless (Pt keeping his eye  closed) Overall Cognitive Status: Difficult to assess Area of Impairment: Orientation;Attention;Memory;Safety/judgement;Following commands;Awareness;Problem solving                 Orientation Level: Disoriented to;Person;Place;Time;Situation Current Attention Level: Focused Memory: Decreased short-term memory Following Commands: Follows one step commands inconsistently (Not following one step commands) Safety/Judgement: Decreased awareness of safety Awareness: Intellectual Problem Solving: Slow processing;Decreased initiation;Difficulty sequencing;Requires verbal cues;Requires tactile cues General Comments: Pt lethargic and keeping eyes closed for majority of session. Pt following simple command to wash his face. However, when presenting him with a tooth brush, pt reporting it is a pencil. Pt becoming restless and reporting "I need to go" and "I need to get back home". Pt also reporting he was a part of the [place] baseline team after therapist brought up the [place] bulls. And pt stating "I am a bear". Pt reporting he sees deer in the parking lot (no deer present) when standing at window with therapist   General Comments  HR 80s. SpO2 99-97% on RA. BP supine 179/62 (103); standing 199/92 (133); supine after mobility 192/96 (123)    Exercises     Shoulder Instructions      Home Living Family/patient expects to be discharged to:: Private residence                                 Additional Comments: Per chart review, pt was living alone and has an PCA. Recent decline in cognition. Pt unable to provide information due to confusion      Prior Functioning/Environment          Comments: Per chart review, pt was independent and active        OT Problem List: Decreased strength;Decreased range of motion;Decreased activity tolerance;Impaired balance (sitting and/or standing);Decreased cognition;Decreased safety awareness;Decreased knowledge of use of DME or  AE;Decreased knowledge of precautions      OT Treatment/Interventions:      OT Goals(Current goals can be found in the care plan section) Acute Rehab OT Goals Patient Stated Goal: "I need to go home" OT Goal Formulation: With patient Time For Goal Achievement: 12/20/20 Potential to Achieve Goals: Good  OT Frequency:     Barriers to D/C:            Co-evaluation              AM-PAC OT "6 Clicks" Daily Activity     Outcome Measure Help from another person eating meals?: A Little Help from another person taking care of personal grooming?: A Lot Help from another person toileting, which includes using toliet, bedpan, or urinal?: A Lot Help from another person bathing (including washing, rinsing, drying)?: A Lot Help from another person to put on and taking off regular upper body clothing?: A Lot Help from another person to put on and taking off regular lower body clothing?: A Lot 6 Click Score: 13   End of Session Nurse Communication: Mobility status;Other (comment) (Elevated BP)  Activity Tolerance: Treatment limited secondary to agitation  Patient left: in bed;with call bell/phone within reach;with bed alarm set;with restraints reapplied  OT Visit Diagnosis: Unsteadiness on feet (R26.81);Other abnormalities of gait and mobility (R26.89);Muscle weakness (generalized) (M62.81)                Time: 3202-3343 OT Time Calculation (min): 20 min Charges:  OT General Charges $OT Visit: 1 Visit OT Evaluation $OT Eval Moderate Complexity: [place], OTR/L Acute Rehab Pager: [phone] Office: [facility] 12/06/2020, 8:36 AM

## 2020-12-06 NOTE — Progress Notes (Signed)
Pt agitated continues to attempt to climb out of bed, attempted to redirect pt, unable to redirect. [place] to both hand and pt continues to attempt to pull them off. MD made aware new order for soft wrist restraints. Daughter made aware of wrist restraints.

## 2020-12-06 NOTE — Progress Notes (Signed)
PROGRESS NOTE  Curtis Dunn [id number] DOB: Feb 19, 1941 DOA: 11/28/2020 PCP: Reynold Bowen, MD  HPI/Recap of past 24 hours: Curtis Dunn is a 80 y.o. male with history of chronic atrial fibrillation and prior stroke in December 2020 who was recently diagnosed with bladder cancer underwent TURB last month with history of colon cancer status post hemicolectomy, diabetes mellitus type 1, was not responding to patient's daughter's phone calls at home and patient's daughter called family to check on the patient.  He was found to be on the bed confused.  Multiple pills were found on the floor.  Not sure if he was taking his medications for last few days.  Patient was brought to the ER.  Per the patient's daughter, his mental status has been declining over the last 2 months, getting more confused, agitated and she was not sure if he was getting dementia.    ED Course: In the ER, confused and in A. fib with RVR.  Labs showing evidence of DKA with blood sugar of 890, bicarb of 7, anion gap of 31, creatinine 2.9 from 1.6, WBC 18.4K, chest x-ray unremarkable, UA positive for pyuria.  Patient was started on fluid bolus and IV insulin for DKA type I.  CT head was unremarkable for any acute intracranial findings.    He received Cardizem and IV Lopressor for his A. fib with RVR.   Hospital course complicated by confusion and agitation requiring soft restraints.  Seen by palliative care team, goals of care discussion completed with his daughter.  Family made decision for comfort care only on 12/06/2020.  12/06/20: Patient was seen and examined at bedside this morning.  He is more alert however he is minimally interactive.    Assessment/Plan: Principal Problem:   DKA (diabetic ketoacidosis) ([place]) Active Problems:   Essential hypertension   PAF (paroxysmal atrial fibrillation) ([facility])   AKI (acute kidney injury) ([place])  Assessment: -Resolved posttreatment: DKA type I -Severe sepsis  secondary to E. coli UTI -Uncontrolled type 1 diabetes secondary to medication noncompliance -Persistent acute metabolic encephalopathy in the setting of severe sepsis -Resolved severe metabolic acidosis in the setting of DKA type I. -Elevated troponin, suspect demand ischemia in the setting of severe metabolic acidosis and AKI -Prolonged QTC seen on twelve-lead EKG done early morning of 12/05/2020 -AKI on CKD 3B likely prerenal in the setting of dehydration from poor oral intake -Resolved hyperkalemia -Resolved hypovolemic hyponatremia -Elevated CPK -Chronic A. fib with RVR, rate is currently controlled -History of prior CVA  Plan: -All goals directed towards comfort care only -Appreciate palliative care team's assistance.   Code Status: DNR/comfort care only.  Family Communication: Updated daughter via phone on 12/06/2020.  Disposition Plan: Possible residential hospice.   Consultants:  Palliative care team.  Procedures:  None.  Antimicrobials:  Rocephin started on 12/05/2020, discontinued on 12/06/2020 due to comfort care.  DVT prophylaxis: Heparin drip, discontinued on 12/06/2020 due to comfort care.  Status is: Inpatient    Dispo:  Patient From: Home  Planned Disposition: Home with Health Care Svc  Expected discharge date: 12/07/2020  Medically stable for discharge: Yes, pending placement possibly to residential hospice.         Objective: Vitals:   12/06/20 0518 12/06/20 0521 12/06/20 0809 12/06/20 1200  BP: 117/85  (!) 179/62 (!) 180/87  Pulse: 86  66 85  Resp:   (!) 21 18  Temp:   97.6 F (36.4 C) (!) 97.4 F (36.3 C)  TempSrc:  Oral  SpO2:   97% 99%  Weight:  56 kg      Intake/Output Summary (Last 24 hours) at 12/06/2020 1525 Last data filed at 12/06/2020 1340 Gross per 24 hour  Intake 971.8 ml  Output 1600 ml  Net -628.2 ml   Filed Weights   12/06/20 0521  Weight: 56 kg    Exam:  . General: 80 y.o. year-old male chronically  ill-appearing in no acute distress.  Minimally interactive.   . Cardiovascular: Irregular rate and rhythm no rubs or gallops.  [name] Respiratory: Clear to auscultation no wheezes or rales.   . Abdomen: Soft tender with palpation.  Bowel sounds present.   . Musculoskeletal: No lower extremity edema bilaterally. [name] Psychiatry: Mood is appropriate for condition and setting..   Data Reviewed: CBC: Recent Labs  Lab December 22, 2020 2132 22-Dec-2020 2152 December 22, 2020 2153 12/06/20 0543  WBC 18.4*  --   --  15.3*  NEUTROABS 17.0*  --   --  14.0*  HGB 11.8* 12.6* 12.2* 11.2*  HCT 40.5 37.0* 36.0* 33.5*  MCV 104.4*  --   --  93.6  PLT 459*  --   --  402*   Basic Metabolic Panel: Recent Labs  Lab 2020/12/22 2132 12-22-20 2152 12-22-2020 2153 12/05/20 0200 12/05/20 1134 12/05/20 1455 12/05/20 1516 12/06/20 0543  NA 136 134* 134* 138 149* 148*  --  149*  K 6.7* 6.6* 6.6* 4.7 3.7 3.6 3.6 4.4  CL 98 106  --  106 113* 110  --  110  CO2 7*  --   --  8* 24 27  --  26  GLUCOSE 819* >700*  --  464* 190* 236*  --  400*  BUN 80* 78*  --  60* 58* 54*  --  41*  CREATININE 2.98* 2.10*  --  2.08* 1.78* 1.66*  --  1.53*  CALCIUM 11.0*  --   --  9.0 11.5* 11.3*  --  11.5*  MG  --   --   --   --   --   --  2.8*  --    GFR: Estimated Creatinine Clearance: 31 mL/min (A) (by C-G formula based on SCr of 1.53 mg/dL (H)). Liver Function Tests: Recent Labs  Lab Dec 22, 2020 2132 12/06/20 0543  AST 45* 52*  ALT 38 40  ALKPHOS 125 107  BILITOT 1.9* 1.3*  PROT 6.9 5.6*  ALBUMIN 3.5 2.7*   No results for input(s): LIPASE, AMYLASE in the last 168 hours. Recent Labs  Lab 12/05/20 1134  AMMONIA 32   Coagulation Profile: Recent Labs  Lab 2020/12/22 2132  INR 1.3*   Cardiac Enzymes: Recent Labs  Lab 2020/12/22 2132 12/05/20 1134  CKTOTAL 547* 624*   BNP (last 3 results) No results for input(s): PROBNP in the last 8760 hours. HbA1C: Recent Labs    12/05/20 0200  HGBA1C 7.5*   CBG: Recent Labs  Lab  12/05/20 2303 12/06/20 0316 12/06/20 0830 12/06/20 1212 12/06/20 1230  GLUCAP 218* 295* 392* 460* 408*   Lipid Profile: No results for input(s): CHOL, HDL, LDLCALC, TRIG, CHOLHDL, LDLDIRECT in the last 72 hours. Thyroid Function Tests: Recent Labs    12/05/20 1134  TSH 4.913*   Anemia Panel: Recent Labs    12/05/20 1134  VITAMINB12 3,252*   Urine analysis:    Component Value Date/Time   COLORURINE YELLOW 12/05/2020 1002   APPEARANCEUR TURBID (A) 12/05/2020 1002   LABSPEC 1.017 12/05/2020 1002   PHURINE 5.0 12/05/2020 1002  GLUCOSEU >=500 (A) 12/05/2020 1002   HGBUR MODERATE (A) 12/05/2020 1002   BILIRUBINUR NEGATIVE 12/05/2020 1002   KETONESUR 20 (A) 12/05/2020 1002   PROTEINUR 100 (A) 12/05/2020 1002   UROBILINOGEN 1.0 02/12/2014 2348   NITRITE NEGATIVE 12/05/2020 1002   LEUKOCYTESUR LARGE (A) 12/05/2020 1002   Sepsis Labs: @LABRCNTIP (procalcitonin:4,lacticidven:4)  ) Recent Results (from the past 240 hour(s))  Urine culture     Status: Abnormal (Preliminary result)   Collection Time: 11/21/2020  8:58 PM   Specimen: In/Out Cath Urine  Result Value Ref Range Status   Specimen Description IN/OUT CATH URINE  Final   Special Requests NONE  Final   Culture (A)  Final    60,000 COLONIES/mL ESCHERICHIA COLI SUSCEPTIBILITIES TO FOLLOW Performed at [facility], [place] [address], [place], [place] [address]    Report Status PENDING  Incomplete  Resp Panel by RT-PCR (Flu A&B, Covid) Nasopharyngeal Swab     Status: None   Collection Time: 12/03/2020  8:59 PM   Specimen: Nasopharyngeal Swab; Nasopharyngeal(NP) swabs in vial transport medium  Result Value Ref Range Status   SARS Coronavirus 2 by RT PCR NEGATIVE NEGATIVE Final    Comment: (NOTE) SARS-CoV-2 target nucleic acids are NOT DETECTED.  The SARS-CoV-2 RNA is generally detectable in upper respiratory specimens during the acute phase of infection. The lowest concentration of SARS-CoV-2 viral copies this  assay can detect is 138 copies/mL. A negative result does not preclude SARS-Cov-2 infection and should not be used as the sole basis for treatment or other patient management decisions. A negative result may occur with  improper specimen collection/handling, submission of specimen other than nasopharyngeal swab, presence of viral mutation(s) within the areas targeted by this assay, and inadequate number of viral copies(<138 copies/mL). A negative result must be combined with clinical observations, patient history, and epidemiological information. The expected result is Negative.  Fact Sheet for Patients:  EntrepreneurPulse.com.au  Fact Sheet for Healthcare Providers:  IncredibleEmployment.be  This test is no t yet approved or cleared by the [place] FDA and  has been authorized for detection and/or diagnosis of SARS-CoV-2 by FDA under an Emergency Use Authorization (EUA). This EUA will remain  in effect (meaning this test can be used) for the duration of the COVID-19 declaration under Section 564(b)(1) of the Act, 21 U.S.C.section 360bbb-3(b)(1), unless the authorization is terminated  or revoked sooner.       Influenza A by PCR NEGATIVE NEGATIVE Final   Influenza B by PCR NEGATIVE NEGATIVE Final    Comment: (NOTE) The Xpert Xpress SARS-CoV-2/FLU/RSV plus assay is intended as an aid in the diagnosis of influenza from Nasopharyngeal swab specimens and should not be used as a sole basis for treatment. Nasal washings and aspirates are unacceptable for Xpert Xpress SARS-CoV-2/FLU/RSV testing.  Fact Sheet for Patients: EntrepreneurPulse.com.au  Fact Sheet for Healthcare Providers: IncredibleEmployment.be  This test is not yet approved or cleared by the [place] FDA and has been authorized for detection and/or diagnosis of SARS-CoV-2 by FDA under an Emergency Use Authorization (EUA). This EUA will  remain in effect (meaning this test can be used) for the duration of the COVID-19 declaration under Section 564(b)(1) of the Act, 21 U.S.C. section 360bbb-3(b)(1), unless the authorization is terminated or revoked.  Performed at [facility], [place] [address], [place], [place] [address]   Blood culture (routine single)     Status: None (Preliminary result)   Collection Time: 12/17/2020  9:39 PM   Specimen:  BLOOD  Result Value Ref Range Status   Specimen Description BLOOD RIGHT ARM  Final   Special Requests   Final    BOTTLES DRAWN AEROBIC AND ANAEROBIC Blood Culture adequate volume   Culture   Final    NO GROWTH 2 DAYS Performed at [facility], [address], [place], [place] [address]    Report Status PENDING  Incomplete      Studies: No results found.  Scheduled Meds: . antiseptic oral rinse  15 mL Topical BID  . LORazepam  0.5 mg Intravenous Q4H  . senna  1 tablet Oral Daily    Continuous Infusions:    LOS: 2 days     Kayleen Memos, MD [facility] Pager [phone]  If 7PM-7AM, please contact night-coverage [email or web address] Password [facility] 12/06/2020, 3:25 PM

## 2020-12-06 NOTE — Progress Notes (Signed)
PT Cancellation Note  Patient Details Name: CHELSEA NUSZ MRN: [id number] DOB: 1941/04/19   Cancelled Treatment:    Reason Eval/Treat Not Completed: Other (comment).  Pt is now full comfort care.  PT orders d/c by palliative care.  PT to sign off.   Thanks,  Verdene Lennert, PT, DPT  Acute Rehabilitation [phone] pager #[phone] office       Barbarann Ehlers Waldine Zenz 12/06/2020, 2:54 PM

## 2020-12-06 NOTE — Progress Notes (Signed)
Daily Progress Note   Patient Name: Curtis Dunn       Date: 12/06/2020 DOB: [birth year]  Age: 80 y.o. MRN#: [id number] Attending Physician: Kayleen Memos, DO Primary Care Physician: Reynold Bowen, MD Admit Date: 11/28/2020  Reason for Consultation/Follow-up: Establishing goals of care  Subjective: I have reviewed medical records including EPIC notes, labs, and imaging. Received report from primary RN - acute concerns over patient's agitation and restlessness. RN states that patient is now requiring mitts and restraints. He has minimal PO intake when offered food/drink.  Went to visit patient at bedside - originally no family/visitors were present; however, daughter arrived to room from [place] as I was present at bedside. Patient was lying in bed awake, alert, disoriented - he was not able to participate in conversation and is minimally interactive. There were signs and non-verbal gestures discomfort noted as well as restlessness/anxiousness through entire visit. No respiratory distress, increased work of breathing, or secretions noted.   Met with daughter/Curtis Dunn  to discuss diagnosis, prognosis, GOC, EOL wishes, disposition, and options.  I introduced Palliative Medicine as specialized medical care for people living with serious illness. It focuses on providing relief from the symptoms and stress of a serious illness. The goal is to improve quality of life for both the patient and the family.  We discussed a brief life review of the patient as well as functional and nutritional status. The patient's wife sadly has passed away - they had two children together, 1 daughter and 1 son - daughter/Curtis Dunn is the only surviving child. Claris Pong tells me that prior to hospitalization, the  patient lived in a private residence alone but has friends/neighbors that check on him often. Claris Pong explained that she has noticed a gradual cognitive decline in the patient since his stroke around January 2021. In May of 2021 he shattered his knee and required surgery. It was around October/November of 2021 when [place] noticed a dramatic decline in the patient's mental and physical status (this was around when the patient was diagnosed with bladder cancer and underwent surgery). Claris Pong describes the patient as becoming increasingly angry, frustrated, and not able to remember daily tasks such as paying bills. She states that she tried to obtain home health aids to help the patient but he originally refused. The patient recently expressed to [facility] that  he was agreeable to a LTC facility as he knew he was declining.   We discussed patient's current illness and what it means in the larger context of patient's on-going co-morbidities. Claris Pong has a clear understanding of the patient's current medical situation.  Natural disease trajectory and expectations at EOL were discussed. I attempted to elicit values and goals of care important to the patient. The difference between aggressive medical intervention and comfort care was considered in light of the patient's goals of care. We also discussed goals related to cancer treatments - Claris Pong understands that treatment would be to prolong life and not necessarily provide quality. We also considered treatments in context of his other comorbidities and chronic illnesses. Claris Pong clearly states that she does not want the patient to suffer and she is not interested in pursuing cancer treatments. Her goals for the patient at this time are for transition to comfort measures. We talked about transition to comfort measures in house and what that would entail inclusive of medications to control pain, dyspnea, agitation, nausea, itching, and hiccups. We discussed  stopping all unnecessary measures such as blood draws, needle sticks, oxygen, antibiotics, CBGs/insulin, cardiac monitoring, and frequent vital signs. Claris Pong was agreeable to initiate full comfort measures in house today. We discussed also being able to remove the patient's mitts and restraints as these are not comfort focused - she expressed gratitude.   Residential hospice was explained and offered. Claris Pong does not feel transferring patient would be beneficial in his current anxious/restless state, which I agree. We discussed keeping him in house to get symptoms better controlled and can provide ongoing assessments for transfer - she was agreeable.   Obtained copies of patient's Living Will and HCPOA. Claris Pong is listed as patient's HCPOA.  Current [place] EOL visitation policy was reviewed. Curtis Dunn expressed appreciation for visit and information today.  Therapeutic listening and emotional support was provided throughout visit.  Visit also consisted of discussions dealing with the complex and emotionally intense issues of symptom management and palliative care in the setting of serious and potentially life-threatening illness. Palliative care team will continue to support patient, patient's family, and medical team.  Discussed with family the importance of continued conversation with the medical providers regarding overall plan of care and treatment options, ensuring decisions are within the context of the patient's values and GOCs.    Questions and concerns were addressed. The patient/family was encouraged to call with questions and/or concerns. PMT card was provided.   Length of Stay: 2  Current Medications: Scheduled Meds:  . antiseptic oral rinse  15 mL Topical BID  . LORazepam  0.5 mg Intravenous Q4H  . senna  1 tablet Oral Daily    Continuous Infusions:   PRN Meds: acetaminophen **OR** acetaminophen, glycopyrrolate **OR** glycopyrrolate **OR** glycopyrrolate,  haloperidol **OR** haloperidol **OR** haloperidol lactate, LORazepam **OR** LORazepam **OR** LORazepam, morphine injection, ondansetron **OR** ondansetron (ZOFRAN) IV, oxyCODONE, oxyCODONE, polyvinyl alcohol  Physical Exam Vitals and nursing note reviewed.  Constitutional:      General: He is not in acute distress.    Appearance: He is cachectic. He is ill-appearing.  Pulmonary:     Effort: No respiratory distress.  Skin:    General: Skin is warm and dry.  Neurological:     Mental Status: He is alert. He is disoriented and confused.     Motor: Weakness present.  Psychiatric:        Mood and Affect: Mood is anxious.        Behavior:  Behavior is agitated.        Cognition and Memory: Cognition is impaired. Memory is impaired.        Judgment: Judgment is impulsive.             Vital Signs: BP (!) 180/87 (BP Location: Left Arm)   Pulse 85   Temp (!) 97.4 F (36.3 C) (Oral)   Resp 18   Wt 56 kg   SpO2 99%   BMI 17.22 kg/m  SpO2: SpO2: 99 % O2 Device: O2 Device: Room Air O2 Flow Rate: O2 Flow Rate (L/min): 2 L/min  Intake/output summary:   Intake/Output Summary (Last 24 hours) at 12/06/2020 1408 Last data filed at 12/06/2020 1340 Gross per 24 hour  Intake 971.8 ml  Output 1600 ml  Net -628.2 ml   LBM: Last BM Date: 12/05/20 Baseline Weight: Weight: 56 kg Most recent weight: Weight: 56 kg       Palliative Assessment/Data: PPS 20%      Patient Active Problem List   Diagnosis Date Noted  . DKA (diabetic ketoacidosis) ([place]) 12/10/2020  . Displaced comminuted fracture of left patella, initial encounter for closed fracture 03/16/2020  . Fractured patella 03/09/2020  . Cerebellar stroke ([place]) 11/15/2019  . Acute CVA (cerebrovascular accident) ([place]) 11/14/2019  . Bilateral impacted cerumen 10/10/2019  . Fungal otitis externa 10/10/2019  . Sensorineural hearing loss (SNHL) of both ears 10/10/2019  . Pain in joint of right ankle 05/12/2019  . Cellulitis and abscess of  foot, except toes 05/03/2019  . Paronychia of great toe of right foot 05/03/2019  . Multiple fractures of ribs of both sides 12/29/2015  . Flail chest 12/25/2015  . AKI (acute kidney injury) ([place]) 12/25/2015  . Hemopneumothorax on left 12/25/2015  . IDDM (insulin dependent diabetes mellitus) 12/25/2015  . History of colon cancer 08/21/2014  . Acute blood loss anemia 02/11/2014  . Fracture dislocation of cervical spine ([place]) 02/10/2014  . C1 cervical fracture ([place]) 02/10/2014  . Fall 02/10/2014  . Distal radius fracture, right 02/10/2014  . Concussion 02/10/2014  . Facial laceration 02/10/2014  . Anticoagulated 02/10/2014  . PAF (paroxysmal atrial fibrillation) ([place]) 09/08/2012  . Palpitations 03/05/2012  . MALIGNANT NEOPLASM OF CECUM 12/11/2007  . COLONIC POLYPS 12/11/2007  . Type 1 diabetes mellitus ([place]) 12/11/2007  . Essential hypertension 12/11/2007    Palliative Care Assessment & Plan   Patient Profile: 80 y.o. male  with past medical history of atrial fibrillation, stroke in December 2020, recent diagnosis of bladder cancer s/p TURB last month, colon cancer s/p hemicolectomy, DM, and HTN presented to the ED 12/16/2020 after daughter called EMS with concerns that patient was not answering her phone calls. EMS found patient in bed confused with stable vitals. Patient was admitted on 12/13/2020 with DKA, acute metabolic encephalopathy, AKI on CKD, atrial fibrillation with RVR.  ED Course:In the ER patient was confused and was in A. fib with RVR. Labs were showing signs of DKA with blood sugar of 890 with bicarb of 7 anion gap of 31 creatinine increased from 1.6 and it is around 2.9 WBC count is 18.4 chest x-ray unremarkable UA is unremarkable. Patient was started on fluid bolus and IV insulin for DKA. CT head is unremarkable. On my exam patient still confused. For the A. fib with RVR I ordered 1 dose of Cardizem bolus 10 mg and Patient was getting IV  metoprolol.  Assessment: DKA Severe sepsis UTI Acute metabolic encephalopathy Metabolic acidosis AKI on CKD Atrial fibrillation Terminal  care Agitation  Reastlessness  Recommendations/Plan: Initiated full comfort measures Continue DNR/DNI as previously documented Daughter would like restlessness/agitation symptoms better controlled before transfer to hospice facility - will provide ongoing assessments for transfer stability Added orders for EOL symptom management and to reflect full comfort measures, as well as discontinued orders that were not focused on comfort Unrestricted visitation orders were placed per current [place] EOL visitation policy  Provide frequent assessments and administer PRN medications as clinically necessary to ensure EOL comfort Copies of Living Will and HCPOA were obtained and will be scanned into [employer]. Daughter/Curtis Dunn is listed as HCPOA. PMT will continue to follow holistically  Goals of Care and Additional Recommendations:  Limitations on Scope of Treatment: Full Comfort Care  Code Status:    Code Status Orders  (From admission, onward)         Start     Ordered   12/06/20 1355  Do not attempt resuscitation (DNR)  Continuous       Question Answer Comment  In the event of cardiac or respiratory ARREST Do not call a "code blue"   In the event of cardiac or respiratory ARREST Do not perform Intubation, CPR, defibrillation or ACLS   In the event of cardiac or respiratory ARREST Use medication by any route, position, wound care, and other measures to relive pain and suffering. May use oxygen, suction and manual treatment of airway obstruction as needed for comfort.      12/06/20 1406        Code Status History    Date Active Date Inactive Code Status Order ID Comments User Context   12/05/2020 0050 12/06/2020 1406 DNR [id number]  Rise Patience, MD ED   12/07/2020 2328 12/05/2020 0050 Full Code [id number]  Rise Patience, MD  ED   03/16/2020 0738 03/18/2020 1645 Full Code [id number]  Drue Novel, PA Inpatient   11/14/2019 2225 11/16/2019 1946 Full Code [id number]  Rise Patience, MD ED   12/25/2015 2120 12/30/2015 2205 Full Code [id number]  Stark Klein, MD ED   Advance Care Planning Activity       Prognosis:   < 2 weeks  Discharge Planning:  To Be Determined  Residential hospice facility vs hospital death  Care plan was discussed with primary RN, patient's daughter, Dr. [place] Crane  Thank you for allowing the Palliative Medicine Team to assist in the care of this patient.   Total Time 70 minutes Prolonged Time Billed  yes       Greater than 50%  of this time was spent counseling and coordinating care related to the above assessment and plan.  Lin Landsman, NP  Please contact Palliative Medicine Team phone at [phone] for questions and concerns.

## 2020-12-07 DIAGNOSIS — E1010 Type 1 diabetes mellitus with ketoacidosis without coma: Secondary | ICD-10-CM | POA: Diagnosis not present

## 2020-12-07 DIAGNOSIS — W19XXXA Unspecified fall, initial encounter: Secondary | ICD-10-CM

## 2020-12-07 DIAGNOSIS — N179 Acute kidney failure, unspecified: Secondary | ICD-10-CM | POA: Diagnosis not present

## 2020-12-07 DIAGNOSIS — I480 Paroxysmal atrial fibrillation: Secondary | ICD-10-CM | POA: Diagnosis not present

## 2020-12-07 LAB — URINE CULTURE: Culture: 60000 — AB

## 2020-12-07 NOTE — Progress Notes (Signed)
Daily Progress Note   Patient Name: Curtis Dunn       Date: 12/07/2020 DOB: January 15, 1941  Age: 80 y.o. MRN#: [id number] Attending Physician: Kayleen Memos, DO Primary Care Physician: Reynold Bowen, MD Admit Date: 11/28/2020  Reason for Follow-up: end of life care, symptom management  Subjective: Patient appears calm and comfortable. Restraints have been removed. No family presently at bedside.   Physical Exam       Neuro: Unresponsive to voice and light touch Pulmonary: Respirations even and unlabored, but shallow   Length of Stay: 3  Current Medications: Scheduled Meds:  . antiseptic oral rinse  15 mL Topical BID  . LORazepam  0.5 mg Intravenous Q4H  . senna  1 tablet Oral Daily     PRN Meds: acetaminophen **OR** acetaminophen, glycopyrrolate **OR** glycopyrrolate **OR** glycopyrrolate, haloperidol **OR** haloperidol **OR** haloperidol lactate, LORazepam **OR** LORazepam **OR** LORazepam, morphine injection, ondansetron **OR** ondansetron (ZOFRAN) IV, oxyCODONE, polyvinyl alcohol      Vital Signs: BP (!) 178/77 (BP Location: Right Arm)   Pulse (!) 51   Temp 98.3 F (36.8 C) (Oral)   Resp 16   Wt 56 kg   SpO2 100%   BMI 17.22 kg/m  SpO2: SpO2: 100 % O2 Device: O2 Device: Room Air  Intake/output summary:   Intake/Output Summary (Last 24 hours) at 12/07/2020 1714 Last data filed at 12/07/2020 0814 Gross per 24 hour  Intake --  Output 1300 ml  Net -1300 ml   LBM: Last BM Date: 12/06/20 Baseline Weight: Weight: 56 kg Most recent weight: Weight: 56 kg       Palliative Assessment/Data: PPS 10%      Palliative Care Assessment & Plan   Patient Profile: 80 y.o.malewith past medical history of atrial fibrillation, stroke in December 2020, recent  diagnosis of bladder cancer s/p TURB last month, colon cancer s/p hemicolectomy, DM, and HTN presented to the ED 12/02/2020 after daughter called EMS with concerns that patient was not answering her phone calls. EMS found patient in bed confused with stable vitals. Patient wasadmitted on1/18/2022with DKA, acute metabolic encephalopathy, AKI on CKD, atrial fibrillation with RVR.  ED Course:In the ER patient was confused and was in A. fib with RVR. Labs were showing signs of DKA with blood sugar of 890 with bicarb of 7 anion gap of  31 creatinine increased from 1.6 and it is around 2.9 WBC count is 18.4 chest x-ray unremarkable UA is unremarkable. Patient was started on fluid bolus and IV insulin for DKA. CT head is unremarkable. On my exam patient still confused. For the A. fib with RVR I ordered 1 dose of Cardizem bolus 10 mg and Patient was getting IV metoprolol.  Assessment: DKA Severe sepsis UTI Acute metabolic encephalopathy Metabolic acidosis AKI on CKD Atrial fibrillation Terminal care Agitation  Reastlessness  Recommendations/Plan:  Continue full comfort measures  DNR/DNI as previously documented  Continue Lorazepam (Ativan) IV every 4 hours  Provide frequent assessments and administer PRN medications as clinically necessary to ensure EOL comfort  Goals of Care and Additional Recommendations:  Limitations on Scope of Treatment: Full Comfort Care  Code Status:  DNR/DNI  Prognosis:   < 2 weeks  Discharge Planning:  Anticipated hospital death versus hospice facility   Thank you for allowing the Palliative Medicine Team to assist in the care of this patient.   Total Time 15 minutes Prolonged Time Billed  no       Greater than 50%  of this time was spent counseling and coordinating care related to the above assessment and plan.  Lavena Bullion, NP  Please contact Palliative Medicine Team phone at [phone] for questions and concerns.

## 2020-12-07 NOTE — Progress Notes (Addendum)
PROGRESS NOTE  Curtis Dunn PPJ:[id number] DOB: 1941/06/22 DOA: 11/27/2020 PCP: Reynold Bowen, MD  HPI/Recap of past 24 hours: Curtis Dunn is a 80 y.o. male with history of chronic atrial fibrillation and prior stroke in December 2020 who was recently diagnosed with bladder cancer underwent TURB last month with history of colon cancer status post hemicolectomy, diabetes mellitus type 1, was not responding to patient's daughter's phone calls at home and patient's daughter called family to check on the patient.  He was found to be on the bed confused.  Multiple pills were found on the floor.  Not sure if he was taking his medications for last few days.  Patient was brought to the ER.  Per the patient's daughter, his mental status has been declining over the last 2 months, getting more confused, agitated and she was not sure if he was getting dementia.    ED Course: In the ER, confused and in A. fib with RVR.  Labs showing evidence of DKA with blood sugar of 890, bicarb of 7, anion gap of 31, creatinine 2.9 from 1.6, WBC 18.4K, chest x-ray unremarkable, UA positive for pyuria.  Patient was started on fluid bolus and IV insulin for DKA type I.  CT head was unremarkable for any acute intracranial findings.    He received Cardizem and IV Lopressor for his A. fib with RVR.   Hospital course complicated by confusion and agitation requiring soft restraints.  Seen by palliative care team, goals of care discussion completed with his daughter.  Family made decision for comfort care only on 12/06/2020.  12/07/20: Patient was seen and examined at his bedside.  Agonal breath noted.  Updated his daughter via phone.  Likely hospital death.  She will have other family members come to see the patient.  Assessment/Plan: Principal Problem:   DKA (diabetic ketoacidosis) ([place]) Active Problems:   Essential hypertension   PAF (paroxysmal atrial fibrillation) ([facility])   AKI (acute kidney injury)  ([place])  Assessment: -Resolved posttreatment: DKA type I -Severe sepsis secondary to E. coli UTI -Uncontrolled type 1 diabetes secondary to medication noncompliance -Persistent acute metabolic encephalopathy in the setting of severe sepsis -Resolved severe metabolic acidosis in the setting of DKA type I. -Elevated troponin, suspect demand ischemia in the setting of severe metabolic acidosis and AKI -Prolonged QTC seen on twelve-lead EKG done early morning of 12/05/2020 -AKI on CKD 3B likely prerenal in the setting of dehydration from poor oral intake -Resolved hyperkalemia -Resolved hypovolemic hyponatremia -Elevated CPK -Chronic A. fib with RVR, rate is currently controlled -History of prior CVA -Severe protein calorie malnutrition -Failure to thrive in an adult.  Plan: -All goals directed towards comfort care only -Appreciate palliative care team's assistance. Anticipated hospital day.   Code Status: DNR/comfort care only.  Family Communication: Updated daughter via phone on 12/06/2020 at 12/07/2018.[name]  Disposition Plan: Anticipating hospital death.   Consultants:  Palliative care team.  Procedures:  None.  Antimicrobials:  Rocephin started on 12/05/2020, discontinued on 12/06/2020 due to comfort care.  DVT prophylaxis: Heparin drip, discontinued on 12/06/2020 due to comfort care.  Status is: Inpatient    Dispo:  Patient From: Home  Planned Disposition: Residential Hospice  Expected discharge date: 12/07/2020  Medically stable for discharge: No, anticipated hospital death.        Objective: Vitals:   12/06/20 0521 12/06/20 0809 12/06/20 1200 12/07/20 0013  BP:  (!) 179/62 (!) 180/87 (!) 178/77  Pulse:  66 85 (!) 51  Resp:  [name])  21 18 16   Temp:  97.6 F (36.4 C) (!) 97.4 F (36.3 C) 98.3 F (36.8 C)  TempSrc:   Oral Oral  SpO2:  97% 99% 100%  Weight: 56 kg       Intake/Output Summary (Last 24 hours) at 12/07/2020 1127 Last data filed at 12/07/2020  8185 Gross per 24 hour  Intake 459.25 ml  Output 2000 ml  Net -1540.75 ml   Filed Weights   12/06/20 0521  Weight: 56 kg    Exam:  . General: 80 y.o. year-old male chronically ill-appearing.  Agonal breath noted.  Unresponsive. . Cardiovascular: Irregular rate and rhythm no rubs or gallops. [name] Respiratory: Agonal breath noted.   . Abdomen: Soft with hypoactive bowel sounds present. . Musculoskeletal: No lower extremity edema bilaterally.   [name] Psychiatry: Unable to assess mood due to unresponsiveness.   Data Reviewed: CBC: Recent Labs  Lab 12-11-2020 2132 11-Dec-2020 2152 Dec 11, 2020 2153 12/06/20 0543  WBC 18.4*  --   --  15.3*  NEUTROABS 17.0*  --   --  14.0*  HGB 11.8* 12.6* 12.2* 11.2*  HCT 40.5 37.0* 36.0* 33.5*  MCV 104.4*  --   --  93.6  PLT 459*  --   --  631*   Basic Metabolic Panel: Recent Labs  Lab 2020-12-11 2132 2020/12/11 2152 11-Dec-2020 2153 12/05/20 0200 12/05/20 1134 12/05/20 1455 12/05/20 1516 12/06/20 0543  NA 136 134* 134* 138 149* 148*  --  149*  K 6.7* 6.6* 6.6* 4.7 3.7 3.6 3.6 4.4  CL 98 106  --  106 113* 110  --  110  CO2 7*  --   --  8* 24 27  --  26  GLUCOSE 819* >700*  --  464* 190* 236*  --  400*  BUN 80* 78*  --  60* 58* 54*  --  41*  CREATININE 2.98* 2.10*  --  2.08* 1.78* 1.66*  --  1.53*  CALCIUM 11.0*  --   --  9.0 11.5* 11.3*  --  11.5*  MG  --   --   --   --   --   --  2.8*  --    GFR: Estimated Creatinine Clearance: 31 mL/min (A) (by C-G formula based on SCr of 1.53 mg/dL (H)). Liver Function Tests: Recent Labs  Lab December 11, 2020 2132 12/06/20 0543  AST 45* 52*  ALT 38 40  ALKPHOS 125 107  BILITOT 1.9* 1.3*  PROT 6.9 5.6*  ALBUMIN 3.5 2.7*   No results for input(s): LIPASE, AMYLASE in the last 168 hours. Recent Labs  Lab 12/05/20 1134  AMMONIA 32   Coagulation Profile: Recent Labs  Lab 12/11/20 2132  INR 1.3*   Cardiac Enzymes: Recent Labs  Lab 12/11/20 2132 12/05/20 1134  CKTOTAL 547* 624*   BNP (last 3  results) No results for input(s): PROBNP in the last 8760 hours. HbA1C: Recent Labs    12/05/20 0200  HGBA1C 7.5*   CBG: Recent Labs  Lab 12/05/20 2303 12/06/20 0316 12/06/20 0830 12/06/20 1212 12/06/20 1230  GLUCAP 218* 295* 392* 460* 408*   Lipid Profile: No results for input(s): CHOL, HDL, LDLCALC, TRIG, CHOLHDL, LDLDIRECT in the last 72 hours. Thyroid Function Tests: Recent Labs    12/05/20 1134  TSH 4.913*   Anemia Panel: Recent Labs    12/05/20 1134  VITAMINB12 3,252*   Urine analysis:    Component Value Date/Time   COLORURINE YELLOW 12/05/2020 1002   APPEARANCEUR TURBID (A) 12/05/2020 1002  LABSPEC 1.017 12/05/2020 1002   PHURINE 5.0 12/05/2020 1002   GLUCOSEU >=500 (A) 12/05/2020 1002   HGBUR MODERATE (A) 12/05/2020 1002   BILIRUBINUR NEGATIVE 12/05/2020 1002   KETONESUR 20 (A) 12/05/2020 1002   PROTEINUR 100 (A) 12/05/2020 1002   UROBILINOGEN 1.0 02/12/2014 2348   NITRITE NEGATIVE 12/05/2020 1002   LEUKOCYTESUR LARGE (A) 12/05/2020 1002   Sepsis Labs: @LABRCNTIP (procalcitonin:4,lacticidven:4)  ) Recent Results (from the past 240 hour(s))  Urine culture     Status: Abnormal   Collection Time: 12/05/2020  8:58 PM   Specimen: In/Out Cath Urine  Result Value Ref Range Status   Specimen Description IN/OUT CATH URINE  Final   Special Requests   Final    NONE Performed at [facility], [place] [address] [place], [place] [address]    Culture 60,000 COLONIES/mL ESCHERICHIA COLI (A)  Final   Report Status 12/07/2020 FINAL  Final   Organism ID, Bacteria ESCHERICHIA COLI (A)  Final      Susceptibility   Escherichia coli - MIC*    AMPICILLIN <=2 SENSITIVE Sensitive     CEFAZOLIN <=4 SENSITIVE Sensitive     CEFEPIME <=0.12 SENSITIVE Sensitive     CEFTRIAXONE <=0.25 SENSITIVE Sensitive     CIPROFLOXACIN <=0.25 SENSITIVE Sensitive     GENTAMICIN <=1 SENSITIVE Sensitive     IMIPENEM <=0.25 SENSITIVE Sensitive     NITROFURANTOIN <=16 SENSITIVE  Sensitive     TRIMETH/SULFA <=20 SENSITIVE Sensitive     AMPICILLIN/SULBACTAM <=2 SENSITIVE Sensitive     PIP/TAZO <=4 SENSITIVE Sensitive     * 60,000 COLONIES/mL ESCHERICHIA COLI  Resp Panel by RT-PCR (Flu A&B, Covid) Nasopharyngeal Swab     Status: None   Collection Time: 12/14/2020  8:59 PM   Specimen: Nasopharyngeal Swab; Nasopharyngeal(NP) swabs in vial transport medium  Result Value Ref Range Status   SARS Coronavirus 2 by RT PCR NEGATIVE NEGATIVE Final    Comment: (NOTE) SARS-CoV-2 target nucleic acids are NOT DETECTED.  The SARS-CoV-2 RNA is generally detectable in upper respiratory specimens during the acute phase of infection. The lowest concentration of SARS-CoV-2 viral copies this assay can detect is 138 copies/mL. A negative result does not preclude SARS-Cov-2 infection and should not be used as the sole basis for treatment or other patient management decisions. A negative result may occur with  improper specimen collection/handling, submission of specimen other than nasopharyngeal swab, presence of viral mutation(s) within the areas targeted by this assay, and inadequate number of viral copies(<138 copies/mL). A negative result must be combined with clinical observations, patient history, and epidemiological information. The expected result is Negative.  Fact Sheet for Patients:  EntrepreneurPulse.com.au  Fact Sheet for Healthcare Providers:  IncredibleEmployment.be  This test is no t yet approved or cleared by the [place] FDA and  has been authorized for detection and/or diagnosis of SARS-CoV-2 by FDA under an Emergency Use Authorization (EUA). This EUA will remain  in effect (meaning this test can be used) for the duration of the COVID-19 declaration under Section 564(b)(1) of the Act, 21 U.S.C.section 360bbb-3(b)(1), unless the authorization is terminated  or revoked sooner.       Influenza A by PCR NEGATIVE NEGATIVE  Final   Influenza B by PCR NEGATIVE NEGATIVE Final    Comment: (NOTE) The Xpert Xpress SARS-CoV-2/FLU/RSV plus assay is intended as an aid in the diagnosis of influenza from Nasopharyngeal swab specimens and should not be used as a sole basis for treatment. Nasal washings and aspirates  are unacceptable for Xpert Xpress SARS-CoV-2/FLU/RSV testing.  Fact Sheet for Patients: EntrepreneurPulse.com.au  Fact Sheet for Healthcare Providers: IncredibleEmployment.be  This test is not yet approved or cleared by the [place] FDA and has been authorized for detection and/or diagnosis of SARS-CoV-2 by FDA under an Emergency Use Authorization (EUA). This EUA will remain in effect (meaning this test can be used) for the duration of the COVID-19 declaration under Section 564(b)(1) of the Act, 21 U.S.C. section 360bbb-3(b)(1), unless the authorization is terminated or revoked.  Performed at [facility], [place] [address] [place], [place] [address]   Blood culture (routine single)     Status: None (Preliminary result)   Collection Time: 11/27/2020  9:39 PM   Specimen: BLOOD  Result Value Ref Range Status   Specimen Description BLOOD RIGHT ARM  Final   Special Requests   Final    BOTTLES DRAWN AEROBIC AND ANAEROBIC Blood Culture adequate volume   Culture   Final    NO GROWTH 2 DAYS Performed at [facility], [place] [address], [place], [place] [address]    Report Status PENDING  Incomplete      Studies: No results found.  Scheduled Meds: . antiseptic oral rinse  15 mL Topical BID  . LORazepam  0.5 mg Intravenous Q4H  . senna  1 tablet Oral Daily    Continuous Infusions:    LOS: 3 days     Kayleen Memos, MD [facility] Pager [phone]  If 7PM-7AM, please contact night-coverage [email or web address] Password [facility] 12/07/2020, 11:27 AM

## 2020-12-07 NOTE — Care Management Important Message (Signed)
Important Message  Patient Details  Name: Curtis Dunn MRN: [id number] Date of Birth: 05/07/1941   Medicare Important Message Given:  Yes     Orbie Pyo 12/07/2020, 2:48 PM

## 2020-12-08 DIAGNOSIS — B9620 Unspecified Escherichia coli [E. coli] as the cause of diseases classified elsewhere: Secondary | ICD-10-CM

## 2020-12-08 DIAGNOSIS — E860 Dehydration: Secondary | ICD-10-CM | POA: Diagnosis not present

## 2020-12-08 DIAGNOSIS — N390 Urinary tract infection, site not specified: Secondary | ICD-10-CM

## 2020-12-08 DIAGNOSIS — E43 Unspecified severe protein-calorie malnutrition: Secondary | ICD-10-CM

## 2020-12-08 DIAGNOSIS — Z515 Encounter for palliative care: Secondary | ICD-10-CM | POA: Diagnosis not present

## 2020-12-08 DIAGNOSIS — G9341 Metabolic encephalopathy: Secondary | ICD-10-CM | POA: Diagnosis not present

## 2020-12-08 DIAGNOSIS — E1010 Type 1 diabetes mellitus with ketoacidosis without coma: Secondary | ICD-10-CM | POA: Diagnosis not present

## 2020-12-09 LAB — CULTURE, BLOOD (SINGLE)
Culture: NO GROWTH
Special Requests: ADEQUATE

## 2020-12-10 ENCOUNTER — Ambulatory Visit: Payer: HMO

## 2020-12-10 DIAGNOSIS — Z8673 Personal history of transient ischemic attack (TIA), and cerebral infarction without residual deficits: Secondary | ICD-10-CM

## 2020-12-10 DIAGNOSIS — E43 Unspecified severe protein-calorie malnutrition: Secondary | ICD-10-CM | POA: Diagnosis present

## 2020-12-10 DIAGNOSIS — Z66 Do not resuscitate: Secondary | ICD-10-CM | POA: Diagnosis present

## 2020-12-10 DIAGNOSIS — R6251 Failure to thrive (child): Secondary | ICD-10-CM | POA: Diagnosis present

## 2020-12-10 DIAGNOSIS — E860 Dehydration: Secondary | ICD-10-CM | POA: Diagnosis present

## 2020-12-10 DIAGNOSIS — E875 Hyperkalemia: Secondary | ICD-10-CM | POA: Diagnosis present

## 2020-12-10 DIAGNOSIS — E871 Hypo-osmolality and hyponatremia: Secondary | ICD-10-CM | POA: Diagnosis present

## 2020-12-10 DIAGNOSIS — Z515 Encounter for palliative care: Secondary | ICD-10-CM

## 2020-12-10 DIAGNOSIS — G9341 Metabolic encephalopathy: Secondary | ICD-10-CM | POA: Diagnosis present

## 2020-12-10 DIAGNOSIS — E872 Acidosis, unspecified: Secondary | ICD-10-CM | POA: Diagnosis present

## 2020-12-10 DIAGNOSIS — B9620 Unspecified Escherichia coli [E. coli] as the cause of diseases classified elsewhere: Secondary | ICD-10-CM | POA: Diagnosis present

## 2020-12-10 DIAGNOSIS — R748 Abnormal levels of other serum enzymes: Secondary | ICD-10-CM | POA: Diagnosis present

## 2020-12-10 DIAGNOSIS — R6520 Severe sepsis without septic shock: Secondary | ICD-10-CM | POA: Diagnosis present

## 2020-12-10 DIAGNOSIS — A419 Sepsis, unspecified organism: Secondary | ICD-10-CM | POA: Diagnosis present

## 2020-12-10 DIAGNOSIS — R778 Other specified abnormalities of plasma proteins: Secondary | ICD-10-CM | POA: Diagnosis present

## 2020-12-10 DIAGNOSIS — N390 Urinary tract infection, site not specified: Secondary | ICD-10-CM | POA: Diagnosis present

## 2020-12-18 NOTE — Progress Notes (Signed)
Daily Progress Note   Patient Name: Curtis Dunn       Date: 14-Dec-2020 DOB: February 02, 1941  Age: 80 y.o. MRN#: [id number] Attending Physician: Kayleen Memos, DO Primary Care Physician: Reynold Bowen, MD Admit Date: 12/07/2020  Reason for Follow-up: end of life care, symptom management, psychosocial support  Subjective: Patient appears comfortable. Unresponsive to voice and light touch. Respirations even and unlabored Cardiac: regular rate and rhythm  Daughter Claris Pong is at bedside. Education and counseling was provided on expectations at EOL. Discussed patient appeared to be actively dying and not stable for transfer to [facility] at this point. She expresses appreciation for PMT support.     Length of Stay: 4  Current Medications: Scheduled Meds:  . antiseptic oral rinse  15 mL Topical BID  . LORazepam  0.5 mg Intravenous Q4H  . senna  1 tablet Oral Daily    Continuous Infusions:   PRN Meds: acetaminophen **OR** acetaminophen, glycopyrrolate **OR** glycopyrrolate **OR** glycopyrrolate, haloperidol **OR** haloperidol **OR** haloperidol lactate, LORazepam **OR** LORazepam **OR** LORazepam, morphine injection, ondansetron **OR** ondansetron (ZOFRAN) IV, oxyCODONE, polyvinyl alcohol        Vital Signs: BP 100/64 (BP Location: Right Arm)   Pulse 96   Temp 98.4 F (36.9 C) (Oral)   Resp 18   Wt 56 kg   SpO2 95%   BMI 17.22 kg/m  SpO2: SpO2: 95 % O2 Device: O2 Device: Room Air O2 Flow Rate: O2 Flow Rate (L/min): 2 L/min  Intake/output summary:   Intake/Output Summary (Last 24 hours) at 12/14/2020 1443 Last data filed at 12/07/2020 2240 Gross per 24 hour  Intake -  Output 600 ml  Net -600 ml   LBM: Last BM Date: 12/06/20 Baseline Weight: Weight: 56 kg Most  recent weight: Weight: 56 kg       Palliative Assessment/Data: PPS 10%      Palliative Care Assessment & Plan   Patient Profile: 80 y.o.malewith past medical history of atrial fibrillation, stroke in December 2020, recent diagnosis of bladder cancer s/p TURB last month, colon cancer s/p hemicolectomy, DM, and HTN presented to the ED 11/18/2020 after daughter called EMS with concerns that patient was not answering her phone calls. EMS found patient in bed confused with stable vitals. Patient wasadmitted on1/18/2022with DKA, acute metabolic encephalopathy, AKI on CKD, atrial fibrillation  with RVR.  ED Course:In the ER patient was confused and was in A. fib with RVR. Labs were showing signs of DKA with blood sugar of 890 with bicarb of 7 anion gap of 31 creatinine increased from 1.6 and it is around 2.9 WBC count is 18.4 chest x-ray unremarkable UA is unremarkable. Patient was started on fluid bolus and IV insulin for DKA. CT head is unremarkable. On my exam patient still confused. For the A. fib with RVR I ordered 1 dose of Cardizem bolus 10 mg and Patient was getting IV metoprolol.  Assessment: DKA Severe sepsis UTI Acute metabolic encephalopathy Metabolic acidosis AKI on CKD Atrial fibrillation Terminal care Agitation  Reastlessness  Recommendations/Plan:  Continue full comfort measures  DNR/DNI as previously documented  Continue Lorazepam (Ativan) IV every 4 hours  Provide frequent assessments and administer PRN medications as clinically necessary to ensure EOL comfort   Goals of Care and Additional Recommendations:  Limitations on Scope of Treatment: Full Comfort Care  Code Status: DNR/DNI  Prognosis:   Hours - Days  Discharge Planning:  Anticipated hospital death   Thank you for allowing the Palliative Medicine Team to assist in the care of this patient.   Total Time 15 minutes Prolonged Time Billed  no       Greater than 50%  of this time  was spent counseling and coordinating care related to the above assessment and plan.  Lavena Bullion, NP  Please contact Palliative Medicine Team phone at [phone] for questions and concerns.

## 2020-12-18 NOTE — Progress Notes (Signed)
PROGRESS NOTE  Curtis Dunn NLZ:[id number] DOB: October 02, 1941 DOA: 12/13/2020 PCP: Reynold Bowen, MD  HPI/Recap of past 24 hours: Curtis Dunn is a 80 y.o. male with history of chronic atrial fibrillation and prior stroke in December 2020 who was recently diagnosed with bladder cancer underwent TURB last month with history of colon cancer status post hemicolectomy, diabetes mellitus type 1, was not responding to patient's daughter's phone calls at home and patient's daughter called family to check on the patient.  He was found to be on the bed confused.  Multiple pills were found on the floor.  Not sure if he was taking his medications for last few days.  Patient was brought to the ER.  Per the patient's daughter, his mental status has been declining over the last 2 months, getting more confused, agitated and she was not sure if he was getting dementia.    ED Course: In the ER, confused and in A. fib with RVR.  Labs showing evidence of DKA with blood sugar of 890, bicarb of 7, anion gap of 31, creatinine 2.9 from 1.6, WBC 18.4K, chest x-ray unremarkable, UA positive for pyuria.  Patient was started on fluid bolus and IV insulin for DKA type I.  CT head was unremarkable for any acute intracranial findings.    He received Cardizem and IV Lopressor for his A. fib with RVR.   Hospital course complicated by confusion and agitation requiring soft restraints.  Seen by palliative care team, goals of care discussion completed with his daughter.  Family made decision for comfort care only on 12/06/2020.  25-Dec-2020: Seen in his bed.  Shallow breath noted.  Likely hospital bed.  Assessment/Plan: Principal Problem:   DKA (diabetic ketoacidosis) ([place]) Active Problems:   Essential hypertension   PAF (paroxysmal atrial fibrillation) ([facility])   AKI (acute kidney injury) ([place])  Assessment: -Resolved posttreatment: DKA type I -Severe sepsis secondary to E. coli UTI -Uncontrolled type 1 diabetes  secondary to medication noncompliance -Persistent acute metabolic encephalopathy in the setting of severe sepsis -Resolved severe metabolic acidosis in the setting of DKA type I. -Elevated troponin, suspect demand ischemia in the setting of severe metabolic acidosis and AKI -Prolonged QTC seen on twelve-lead EKG done early morning of 12/05/2020 -AKI on CKD 3B likely prerenal in the setting of dehydration from poor oral intake -Resolved hyperkalemia -Resolved hypovolemic hyponatremia -Elevated CPK -Chronic A. fib with RVR, rate is currently controlled -History of prior CVA -Severe protein calorie malnutrition -Failure to thrive in an adult.  Plan: -All goals directed towards comfort care only -Appreciate palliative care team's assistance. Likely hospital day.   Code Status: DNR/comfort care only.  Family Communication: Updated daughter via phone on 12/06/2020 at 12/07/2018.  Disposition Plan: Likely hospital death.   Consultants:  Palliative care team.  Procedures:  None.  Antimicrobials:  Rocephin started on 12/05/2020, discontinued on 12/06/2020 due to comfort care.  DVT prophylaxis: Heparin drip, discontinued on 12/06/2020 due to comfort care.  Status is: Inpatient    Dispo:  Patient From: Home  Planned Disposition: Residential Hospice  Expected discharge date: 12/09/2020.  Medically stable for discharge: No, anticipated hospital death.        Objective: Vitals:   12/06/20 0809 12/06/20 1200 12/07/20 0013 Dec 25, 2020 0301  BP: (!) 179/62 (!) 180/87 (!) 178/77 100/64  Pulse: 66 85 (!) 51 96  Resp: (!) 21 18 16 18   Temp: 97.6 F (36.4 C) (!) 97.4 F (36.3 C) 98.3 F (36.8 C) 98.4 F (  36.9 C)  TempSrc:  Oral Oral Oral  SpO2: 97% 99% 100% 95%  Weight:        Intake/Output Summary (Last 24 hours) at 2020-12-24 1436 Last data filed at 12/07/2020 2240 Gross per 24 hour  Intake -  Output 600 ml  Net -600 ml   Filed Weights   12/06/20 0521  Weight: 56  kg    Exam: No significant changes from prior exam. . General: 80 y.o. year-old male chronically ill-appearing.  Agonal breath noted.  Unresponsive. . Cardiovascular: Irregular rate and rhythm no rubs or gallops. [name] Respiratory: Agonal breath noted.   . Abdomen: Soft with hypoactive bowel sounds present. . Musculoskeletal: No lower extremity edema bilaterally.   [name] Psychiatry: Unable to assess mood due to unresponsiveness.   Data Reviewed: CBC: Recent Labs  Lab 11/22/2020 2132 12/14/2020 2152 12/03/2020 2153 12/06/20 0543  WBC 18.4*  --   --  15.3*  NEUTROABS 17.0*  --   --  14.0*  HGB 11.8* 12.6* 12.2* 11.2*  HCT 40.5 37.0* 36.0* 33.5*  MCV 104.4*  --   --  93.6  PLT 459*  --   --  [id number]   Basic Metabolic Panel: Recent Labs  Lab 12/14/2020 2132 11/20/2020 2152 11/27/2020 2153 12/05/20 0200 12/05/20 1134 12/05/20 1455 12/05/20 1516 12/06/20 0543  NA 136 134* 134* 138 149* 148*  --  149*  K 6.7* 6.6* 6.6* 4.7 3.7 3.6 3.6 4.4  CL 98 106  --  106 113* 110  --  110  CO2 7*  --   --  8* 24 27  --  26  GLUCOSE 819* >700*  --  464* 190* 236*  --  400*  BUN 80* 78*  --  60* 58* 54*  --  41*  CREATININE 2.98* 2.10*  --  2.08* 1.78* 1.66*  --  1.53*  CALCIUM 11.0*  --   --  9.0 11.5* 11.3*  --  11.5*  MG  --   --   --   --   --   --  2.8*  --    GFR: Estimated Creatinine Clearance: 31 mL/min (A) (by C-G formula based on SCr of 1.53 mg/dL (H)). Liver Function Tests: Recent Labs  Lab 11/27/2020 2132 12/06/20 0543  AST 45* 52*  ALT 38 40  ALKPHOS 125 107  BILITOT 1.9* 1.3*  PROT 6.9 5.6*  ALBUMIN 3.5 2.7*   No results for input(s): LIPASE, AMYLASE in the last 168 hours. Recent Labs  Lab 12/05/20 1134  AMMONIA 32   Coagulation Profile: Recent Labs  Lab 11/27/2020 2132  INR 1.3*   Cardiac Enzymes: Recent Labs  Lab 12/11/2020 2132 12/05/20 1134  CKTOTAL 547* 624*   BNP (last 3 results) No results for input(s): PROBNP in the last 8760 hours. HbA1C: No results for  input(s): HGBA1C in the last 72 hours. CBG: Recent Labs  Lab 12/05/20 2303 12/06/20 0316 12/06/20 0830 12/06/20 1212 12/06/20 1230  GLUCAP 218* 295* 392* 460* 408*   Lipid Profile: No results for input(s): CHOL, HDL, LDLCALC, TRIG, CHOLHDL, LDLDIRECT in the last 72 hours. Thyroid Function Tests: No results for input(s): TSH, T4TOTAL, FREET4, T3FREE, THYROIDAB in the last 72 hours. Anemia Panel: No results for input(s): VITAMINB12, FOLATE, FERRITIN, TIBC, IRON, RETICCTPCT in the last 72 hours. Urine analysis:    Component Value Date/Time   COLORURINE YELLOW 12/05/2020 1002   APPEARANCEUR TURBID (A) 12/05/2020 1002   LABSPEC 1.017 12/05/2020 1002   PHURINE 5.0 12/05/2020  1002   GLUCOSEU >=500 (A) 12/05/2020 1002   HGBUR MODERATE (A) 12/05/2020 1002   BILIRUBINUR NEGATIVE 12/05/2020 1002   KETONESUR 20 (A) 12/05/2020 1002   PROTEINUR 100 (A) 12/05/2020 1002   UROBILINOGEN 1.0 02/12/2014 2348   NITRITE NEGATIVE 12/05/2020 1002   LEUKOCYTESUR LARGE (A) 12/05/2020 1002   Sepsis Labs: @LABRCNTIP (procalcitonin:4,lacticidven:4)  ) Recent Results (from the past 240 hour(s))  Urine culture     Status: Abnormal   Collection Time: 12/15/2020  8:58 PM   Specimen: In/Out Cath Urine  Result Value Ref Range Status   Specimen Description IN/OUT CATH URINE  Final   Special Requests   Final    NONE Performed at [facility], [place] [address] [place], [place] [address]    Culture 60,000 COLONIES/mL ESCHERICHIA COLI (A)  Final   Report Status 12/07/2020 FINAL  Final   Organism ID, Bacteria ESCHERICHIA COLI (A)  Final      Susceptibility   Escherichia coli - MIC*    AMPICILLIN <=2 SENSITIVE Sensitive     CEFAZOLIN <=4 SENSITIVE Sensitive     CEFEPIME <=0.12 SENSITIVE Sensitive     CEFTRIAXONE <=0.25 SENSITIVE Sensitive     CIPROFLOXACIN <=0.25 SENSITIVE Sensitive     GENTAMICIN <=1 SENSITIVE Sensitive     IMIPENEM <=0.25 SENSITIVE Sensitive     NITROFURANTOIN <=16 SENSITIVE  Sensitive     TRIMETH/SULFA <=20 SENSITIVE Sensitive     AMPICILLIN/SULBACTAM <=2 SENSITIVE Sensitive     PIP/TAZO <=4 SENSITIVE Sensitive     * 60,000 COLONIES/mL ESCHERICHIA COLI  Resp Panel by RT-PCR (Flu A&B, Covid) Nasopharyngeal Swab     Status: None   Collection Time: 12/03/2020  8:59 PM   Specimen: Nasopharyngeal Swab; Nasopharyngeal(NP) swabs in vial transport medium  Result Value Ref Range Status   SARS Coronavirus 2 by RT PCR NEGATIVE NEGATIVE Final    Comment: (NOTE) SARS-CoV-2 target nucleic acids are NOT DETECTED.  The SARS-CoV-2 RNA is generally detectable in upper respiratory specimens during the acute phase of infection. The lowest concentration of SARS-CoV-2 viral copies this assay can detect is 138 copies/mL. A negative result does not preclude SARS-Cov-2 infection and should not be used as the sole basis for treatment or other patient management decisions. A negative result may occur with  improper specimen collection/handling, submission of specimen other than nasopharyngeal swab, presence of viral mutation(s) within the areas targeted by this assay, and inadequate number of viral copies(<138 copies/mL). A negative result must be combined with clinical observations, patient history, and epidemiological information. The expected result is Negative.  Fact Sheet for Patients:  EntrepreneurPulse.com.au  Fact Sheet for Healthcare Providers:  IncredibleEmployment.be  This test is no t yet approved or cleared by the [place] FDA and  has been authorized for detection and/or diagnosis of SARS-CoV-2 by FDA under an Emergency Use Authorization (EUA). This EUA will remain  in effect (meaning this test can be used) for the duration of the COVID-19 declaration under Section 564(b)(1) of the Act, 21 U.S.C.section 360bbb-3(b)(1), unless the authorization is terminated  or revoked sooner.       Influenza A by PCR NEGATIVE NEGATIVE  Final   Influenza B by PCR NEGATIVE NEGATIVE Final    Comment: (NOTE) The Xpert Xpress SARS-CoV-2/FLU/RSV plus assay is intended as an aid in the diagnosis of influenza from Nasopharyngeal swab specimens and should not be used as a sole basis for treatment. Nasal washings and aspirates are unacceptable for Xpert Xpress SARS-CoV-2/FLU/RSV testing.  Fact  Sheet for Patients: EntrepreneurPulse.com.au  Fact Sheet for Healthcare Providers: IncredibleEmployment.be  This test is not yet approved or cleared by the [place] FDA and has been authorized for detection and/or diagnosis of SARS-CoV-2 by FDA under an Emergency Use Authorization (EUA). This EUA will remain in effect (meaning this test can be used) for the duration of the COVID-19 declaration under Section 564(b)(1) of the Act, 21 U.S.C. section 360bbb-3(b)(1), unless the authorization is terminated or revoked.  Performed at [facility], [place] [address] [place], [place] [address]   Blood culture (routine single)     Status: None (Preliminary result)   Collection Time: 11/25/2020  9:39 PM   Specimen: BLOOD  Result Value Ref Range Status   Specimen Description BLOOD RIGHT ARM  Final   Special Requests   Final    BOTTLES DRAWN AEROBIC AND ANAEROBIC Blood Culture adequate volume   Culture   Final    NO GROWTH 3 DAYS Performed at [facility], [place] [address], [place], [place] [address]    Report Status PENDING  Incomplete      Studies: No results found.  Scheduled Meds: . antiseptic oral rinse  15 mL Topical BID  . LORazepam  0.5 mg Intravenous Q4H  . senna  1 tablet Oral Daily    Continuous Infusions:    LOS: 4 days     Kayleen Memos, MD [facility] Pager [phone]  If 7PM-7AM, please contact night-coverage [email or web address] Password [facility] Dec 24, 2020, 2:36 PM

## 2020-12-18 NOTE — Discharge Summary (Signed)
Death Summary  Curtis Dunn MGQ:[id number] DOB: July 15, 1941 DOA: 11-Dec-2020  PCP: Reynold Bowen, MD  Admit date: 12-11-2020 Date of Death: 2020-12-15 Time of Death: 02/25/1914 Notification: Reynold Bowen, MD notified of death of 17-Dec-2020   History of present illness:  Curtis Dunn is a 80 y.o. male with a history of paroxysmal atrial fibrillation on Eliquis, prior stroke in December 2020, recently diagnosed bladder cancer, status post TURB the month prior to his presentation, history of colon cancer status post hemicolectomy, diabetes mellitus type 1, was not responding to his daughter's phone calls at home, she called family to check on him.  He was found on his bed confused. Multiple pills were on the floor. Not sure if he was taking his medications for the last few days. Patient was brought in to the ER for further evaluation and management.  Per the patient's daughter, his mental status has been declining over the last 2 months, getting more confused and agitated.  She was not sure if he was getting dementia.   ED Course:In the ER, confused, vital signs notable for A. fib with RVR. Labs showing evidence of DKA with blood sugar of 890, bicarb of 7, anion gap of 31, creatinine 2.9 from 1.6 baseline, WBC 18.4K, chest x-ray unremarkable, UA positive for pyuria. Patient was started on IV fluid bolus and IV insulin for DKA type I. CT head was unremarkable for any acute intracranial findings. He received Cardizem and IV Lopressor for his A. fib with RVR.   Seen by palliative care team, goals of care discussion completed with his daughter.  Family made decision for comfort care only on 12/06/2020.  On December 15, 2020, at [facility], patient expired at 85.    Final Diagnoses:  1-Cardiopulmonary arrest   DKA (diabetic ketoacidosis), type 1 diabetes.   Essential hypertension.   Paroxysmal A-fib ([place]).   AKI (acute kidney injury) ([place]).   DNR (do not resuscitate).    Comfort measures only status.   Hyperkalemia.   Metabolic acidosis.   Severe sepsis ([place]).   E-coli UTI.   Acute metabolic encephalopathy.   Elevated troponin.   Dehydration.   Elevated CPK.[name]   Hyponatremia.   History of embolic stroke.   Severe protein-calorie malnutrition ([place]).   Failure to thrive in an adult.   The results of significant diagnostics from this hospitalization (including imaging, microbiology, ancillary and laboratory) are listed below for reference.    Significant Diagnostic Studies: DG Pelvis 1-2 Views  Result Date: 12/05/2020 CLINICAL DATA:  Fall. EXAM: PELVIS - 1-2 VIEW COMPARISON:  12/25/2015. FINDINGS: Diffuse osteopenia. Degenerative changes lumbar spine and both hips. No acute bony or joint abnormality. No evidence of fracture dislocation. Pelvic calcifications consistent with phleboliths. IMPRESSION: Diffuse osteopenia. Degenerative changes lumbar spine and both hips. No acute abnormality. Electronically Signed   By: Marcello Moores  Register   On: 12/05/2020 07:50   CT Head Wo Contrast  Result Date: 2020/12/11 CLINICAL DATA:  Mental status change EXAM: CT HEAD WITHOUT CONTRAST TECHNIQUE: Contiguous axial images were obtained from the base of the skull through the vertex without intravenous contrast. COMPARISON:  CT 11/14/2019, MRI 11/15/2019 CT 04/12/2014 FINDINGS: Brain: No acute territorial infarction, hemorrhage, or intracranial mass is visualized. Moderate atrophy. Chronic infarcts within the right cerebellum, left occipital lobe and right operculum. Stable ventricle size. Vascular: No hyperdense vessels.  Carotid vascular calcification Skull: No acute fracture Sinuses/Orbits: No acute finding. Mucosal thickening or retention cyst in the right maxillary sinus. Other: Chronic fracture deformity of  C1 with chronic malalignment of the dens and clivus. IMPRESSION: 1. No CT evidence for acute intracranial abnormality. 2. Atrophy and multifocal chronic infarcts.  Electronically Signed   By: Donavan Foil M.D.   On: 11/27/2020 22:03   DG Chest Port 1 View  Result Date: 11/30/2020 CLINICAL DATA:  Altered mental status and hyperglycemia. EXAM: PORTABLE CHEST 1 VIEW COMPARISON:  December 29, 2015 FINDINGS: The heart size and mediastinal contours are within normal limits. Both lungs are clear. Multiple chronic left-sided rib fractures are seen. A chronic seventh right rib fracture is also seen. IMPRESSION: No active disease. Electronically Signed   By: Virgina Norfolk M.D.   On: 11/29/2020 21:15    Microbiology: Recent Results (from the past 240 hour(s))  Urine culture     Status: Abnormal   Collection Time: 12/12/2020  8:58 PM   Specimen: In/Out Cath Urine  Result Value Ref Range Status   Specimen Description IN/OUT CATH URINE  Final   Special Requests   Final    NONE Performed at [facility], [address] [place], [place] [address]    Culture 60,000 COLONIES/mL ESCHERICHIA COLI (A)  Final   Report Status 12/07/2020 FINAL  Final   Organism ID, Bacteria ESCHERICHIA COLI (A)  Final      Susceptibility   Escherichia coli - MIC*    AMPICILLIN <=2 SENSITIVE Sensitive     CEFAZOLIN <=4 SENSITIVE Sensitive     CEFEPIME <=0.12 SENSITIVE Sensitive     CEFTRIAXONE <=0.25 SENSITIVE Sensitive     CIPROFLOXACIN <=0.25 SENSITIVE Sensitive     GENTAMICIN <=1 SENSITIVE Sensitive     IMIPENEM <=0.25 SENSITIVE Sensitive     NITROFURANTOIN <=16 SENSITIVE Sensitive     TRIMETH/SULFA <=20 SENSITIVE Sensitive     AMPICILLIN/SULBACTAM <=2 SENSITIVE Sensitive     PIP/TAZO <=4 SENSITIVE Sensitive     * 60,000 COLONIES/mL ESCHERICHIA COLI  Resp Panel by RT-PCR (Flu A&B, Covid) Nasopharyngeal Swab     Status: None   Collection Time: 11/30/2020  8:59 PM   Specimen: Nasopharyngeal Swab; Nasopharyngeal(NP) swabs in vial transport medium  Result Value Ref Range Status   SARS Coronavirus 2 by RT PCR NEGATIVE NEGATIVE Final    Comment: (NOTE) SARS-CoV-2 target  nucleic acids are NOT DETECTED.  The SARS-CoV-2 RNA is generally detectable in upper respiratory specimens during the acute phase of infection. The lowest concentration of SARS-CoV-2 viral copies this assay can detect is 138 copies/mL. A negative result does not preclude SARS-Cov-2 infection and should not be used as the sole basis for treatment or other patient management decisions. A negative result may occur with  improper specimen collection/handling, submission of specimen other than nasopharyngeal swab, presence of viral mutation(s) within the areas targeted by this assay, and inadequate number of viral copies(<138 copies/mL). A negative result must be combined with clinical observations, patient history, and epidemiological information. The expected result is Negative.  Fact Sheet for Patients:  EntrepreneurPulse.com.au  Fact Sheet for Healthcare Providers:  IncredibleEmployment.be  This test is no t yet approved or cleared by the [place] FDA and  has been authorized for detection and/or diagnosis of SARS-CoV-2 by FDA under an Emergency Use Authorization (EUA). This EUA will remain  in effect (meaning this test can be used) for the duration of the COVID-19 declaration under Section 564(b)(1) of the Act, 21 U.S.C.section 360bbb-3(b)(1), unless the authorization is terminated  or revoked sooner.       Influenza A by PCR NEGATIVE NEGATIVE  Final   Influenza B by PCR NEGATIVE NEGATIVE Final    Comment: (NOTE) The Xpert Xpress SARS-CoV-2/FLU/RSV plus assay is intended as an aid in the diagnosis of influenza from Nasopharyngeal swab specimens and should not be used as a sole basis for treatment. Nasal washings and aspirates are unacceptable for Xpert Xpress SARS-CoV-2/FLU/RSV testing.  Fact Sheet for Patients: EntrepreneurPulse.com.au  Fact Sheet for Healthcare  Providers: IncredibleEmployment.be  This test is not yet approved or cleared by the [place] FDA and has been authorized for detection and/or diagnosis of SARS-CoV-2 by FDA under an Emergency Use Authorization (EUA). This EUA will remain in effect (meaning this test can be used) for the duration of the COVID-19 declaration under Section 564(b)(1) of the Act, 21 U.S.C. section 360bbb-3(b)(1), unless the authorization is terminated or revoked.  Performed at [facility], [place] [address], [place], [place] [address]   Blood culture (routine single)     Status: None   Collection Time: 17-Dec-2020  9:39 PM   Specimen: BLOOD  Result Value Ref Range Status   Specimen Description BLOOD RIGHT ARM  Final   Special Requests   Final    BOTTLES DRAWN AEROBIC AND ANAEROBIC Blood Culture adequate volume   Culture   Final    NO GROWTH 5 DAYS Performed at [facility], [place] [address], [place], [place] [address]    Report Status 12/09/2020 FINAL  Final     Labs: Basic Metabolic Panel: Recent Labs  Lab 12-17-20 2132 12/17/20 2152 17-Dec-2020 2153 12/05/20 0200 12/05/20 1134 12/05/20 1455 12/05/20 1516 12/06/20 0543  NA 136 134* 134* 138 149* 148*  --  149*  K 6.7* 6.6* 6.6* 4.7 3.7 3.6 3.6 4.4  CL 98 106  --  106 113* 110  --  110  CO2 7*  --   --  8* 24 27  --  26  GLUCOSE 819* >700*  --  464* 190* 236*  --  400*  BUN 80* 78*  --  60* 58* 54*  --  41*  CREATININE 2.98* 2.10*  --  2.08* 1.78* 1.66*  --  1.53*  CALCIUM 11.0*  --   --  9.0 11.5* 11.3*  --  11.5*  MG  --   --   --   --   --   --  2.8*  --    Liver Function Tests: Recent Labs  Lab 17-Dec-2020 2132 12/06/20 0543  AST 45* 52*  ALT 38 40  ALKPHOS 125 107  BILITOT 1.9* 1.3*  PROT 6.9 5.6*  ALBUMIN 3.5 2.7*   No results for input(s): LIPASE, AMYLASE in the last 168 hours. Recent Labs  Lab 12/05/20 1134  AMMONIA 32   CBC: Recent Labs  Lab December 17, 2020 2132 17-Dec-2020 2152 12-17-2020 2153  12/06/20 0543  WBC 18.4*  --   --  15.3*  NEUTROABS 17.0*  --   --  14.0*  HGB 11.8* 12.6* 12.2* 11.2*  HCT 40.5 37.0* 36.0* 33.5*  MCV 104.4*  --   --  93.6  PLT 459*  --   --  402*   Cardiac Enzymes: Recent Labs  Lab 12/17/2020 2132 12/05/20 1134  CKTOTAL 547* 624*   D-Dimer No results for input(s): DDIMER in the last 72 hours. BNP: Invalid input(s): POCBNP CBG: Recent Labs  Lab 12/05/20 2303 12/06/20 0316 12/06/20 0830 12/06/20 1212 12/06/20 1230  GLUCAP 218* 295* 392* 460* 408*   Anemia work up No results for input(s): VITAMINB12,  FOLATE, FERRITIN, TIBC, IRON, RETICCTPCT in the last 72 hours. Urinalysis    Component Value Date/Time   COLORURINE YELLOW 12/05/2020 1002   APPEARANCEUR TURBID (A) 12/05/2020 1002   LABSPEC 1.017 12/05/2020 1002   PHURINE 5.0 12/05/2020 1002   GLUCOSEU >=500 (A) 12/05/2020 1002   HGBUR MODERATE (A) 12/05/2020 1002   BILIRUBINUR NEGATIVE 12/05/2020 1002   KETONESUR 20 (A) 12/05/2020 1002   PROTEINUR 100 (A) 12/05/2020 1002   UROBILINOGEN 1.0 02/12/2014 2348   NITRITE NEGATIVE 12/05/2020 1002   LEUKOCYTESUR LARGE (A) 12/05/2020 1002   Sepsis Labs Invalid input(s): PROCALCITONIN,  WBC,  LACTICIDVEN     SIGNED:  Kayleen Memos, MD  [facility] 12/10/2020, 6:41 PM Pager   If 7PM-7AM, please contact night-coverage [email or web address] Password [email or web address]

## 2020-12-18 NOTE — Progress Notes (Signed)
   12-17-2020 1915  Attending [place]  Attending Physician Notified Y  Attending Physician (First and Last Name) Irene Pap, DO  Will the above attending physician sign death certificate? Yes  Post Mortem Checklist  Date of Death 12/17/20  Time of Death 1914[birth year]02-26  Pronounced By Arville Lime RN ; Nellie, RN  Next of kin notified Yes  Name of next of kin notified of death Dariusz Brase  Contact Person's Relationship to Patient Daughter  Contact Person's Phone Number [phone]  Was the patient a No Code Blue or a Limited Code Blue? No  Did the patient die unattended? Yes  Patient restrained? Not applicable  Height 5' 29.56" (1.803 m)  Weight 56 kg  Body preparation complete N  [place] [facility]  Notification Date 2020/12/17  Notification Time [place] [facility] Number [phone]  Is patient a potential donor? N  Autopsy  Autopsy requested by N/A  Patient and Hospital Property Returned  Patient belongings from bedside/safe/pharmacy returned  None  Dead on Arrival (Emergency Department)  Patient dead on arrival? No  Medical Examiner  Is this a medical examiner's case? [place] home name/address/phone # [place] [phone]  Planned location of pickup [place]

## 2020-12-18 DEATH — deceased

## 2020-12-25 ENCOUNTER — Ambulatory Visit: Payer: HMO | Admitting: Oncology

## 2021-05-07 IMAGING — CT CT HEAD W/O CM
4 series · 15 of 47 positions shown, 17 images · non-contrast
Comparison: December 25, 2015

CLINICAL DATA: Left hand numbness

EXAM:
CT HEAD WITHOUT CONTRAST
TECHNIQUE: Contiguous axial images were obtained from the base of the skull
through the vertex without intravenous contrast.

[Series 3: head without · axial · non-contrast · 0.42mm/px · z∈[-130,-10]mm · 7 of 34 slices shown, 9 images]
[im 5/34  brain]
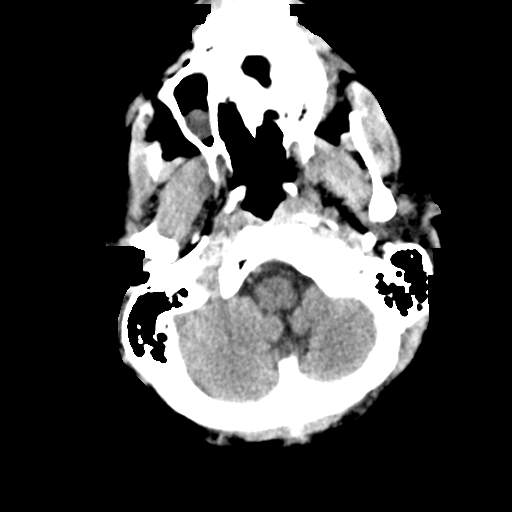
[im 5/34  bone]
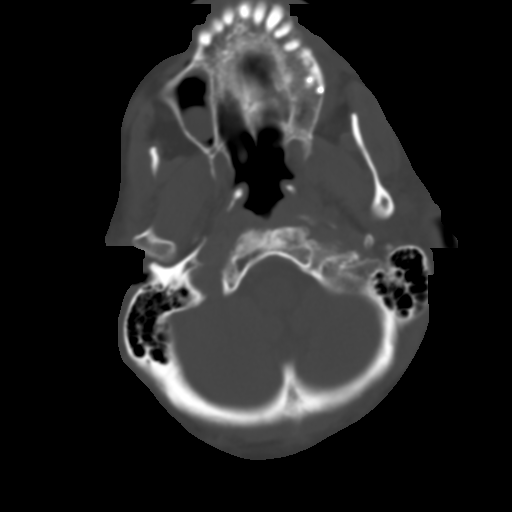
[im 9/34  brain]
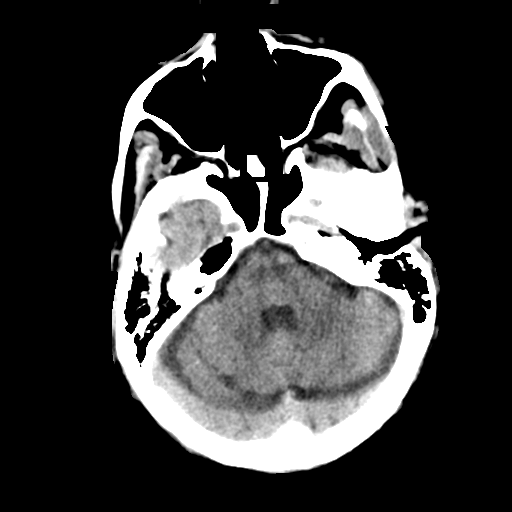
[im 13/34  brain]
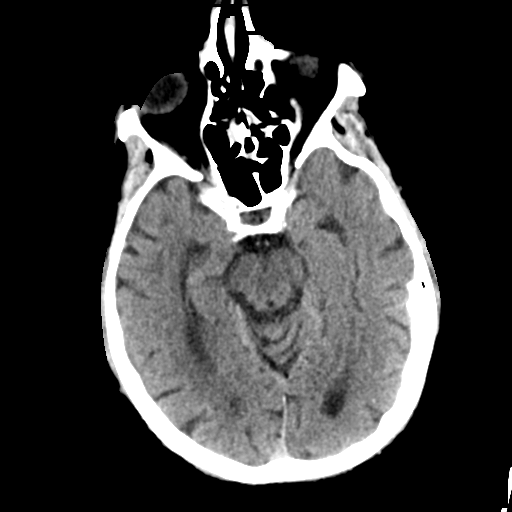
[im 17/34  brain]
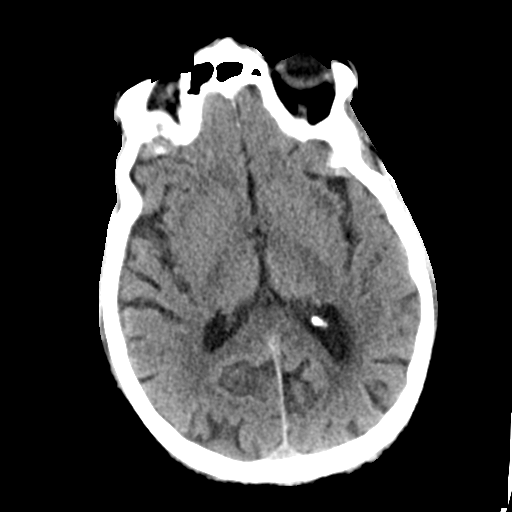
[im 21/34  brain]
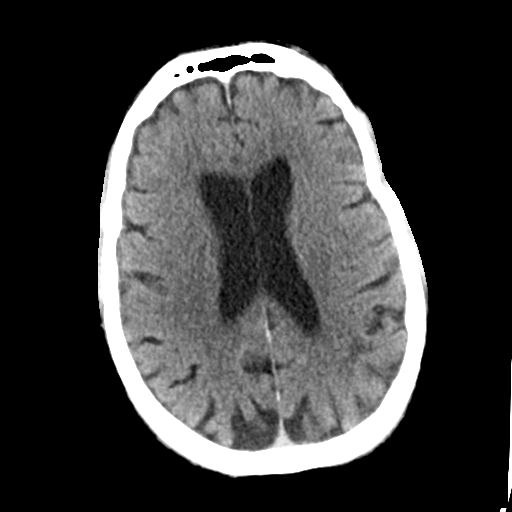
[im 21/34  bone]
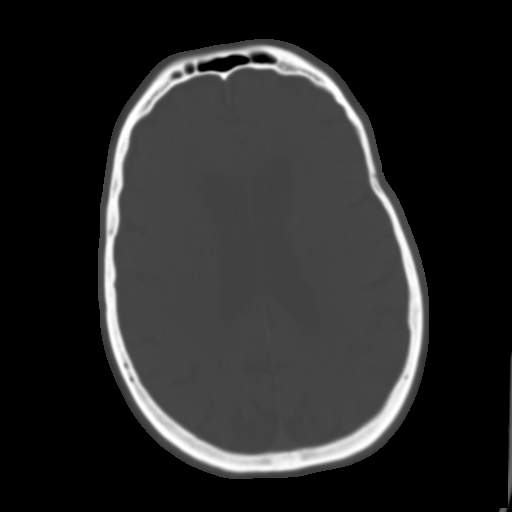
[im 25/34  brain]
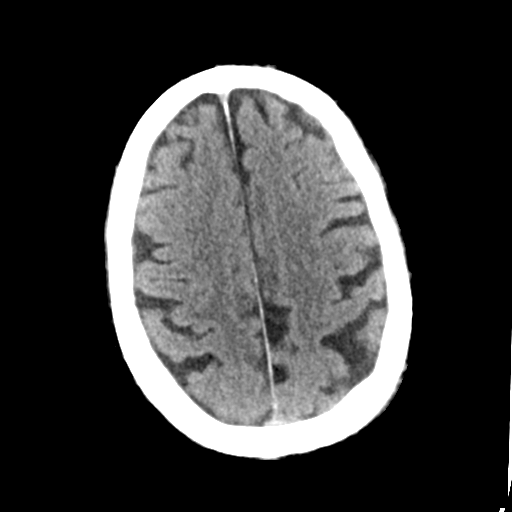
[im 29/34  brain]
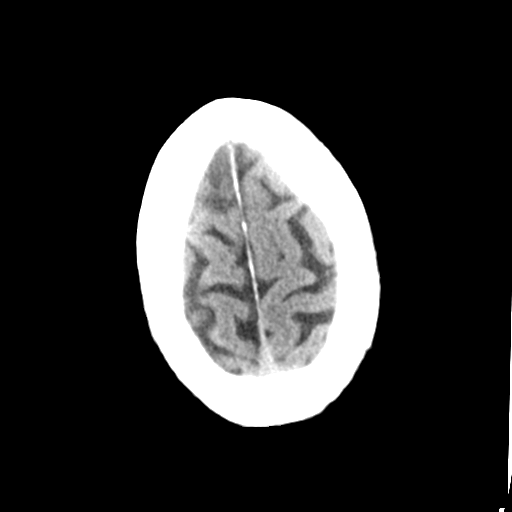

[Series 4: head bone · axial · 0.42mm/px · z∈[-134,-118]mm · 2 of 85 slices shown]
[im 9/85  bone]
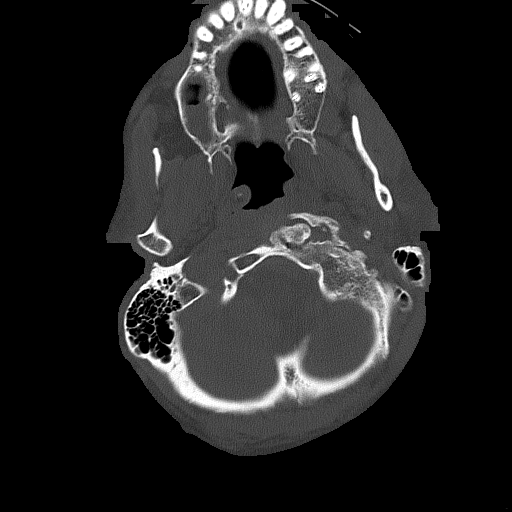
[im 17/85  bone]
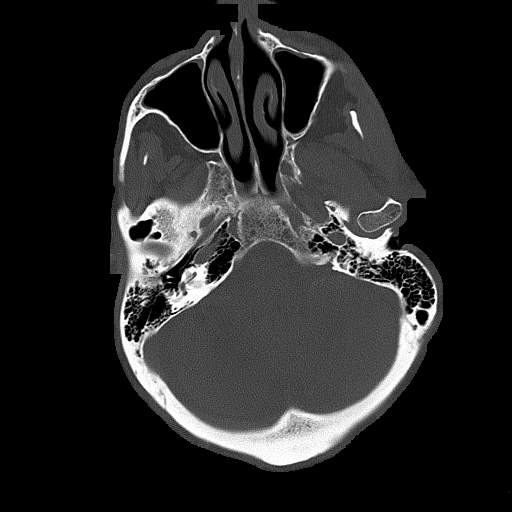

[Series 5: head without cor · coronal · non-contrast · 0.33mm/px · 3 of 70 slices shown]
[im 24/70  brain]
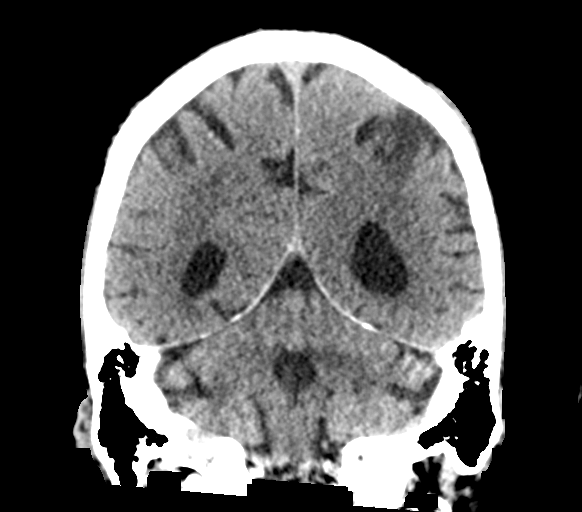
[im 31/70  brain]
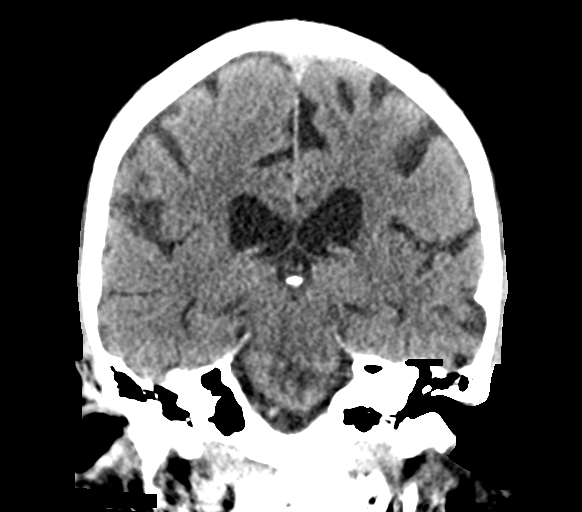
[im 39/70  brain]
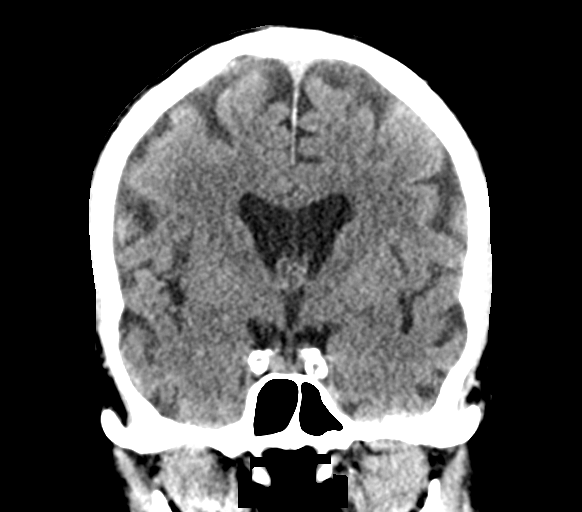

[Series 6: head without sag · sagittal · non-contrast · 0.33mm/px · 3 of 57 slices shown]
[im 19/57  brain]
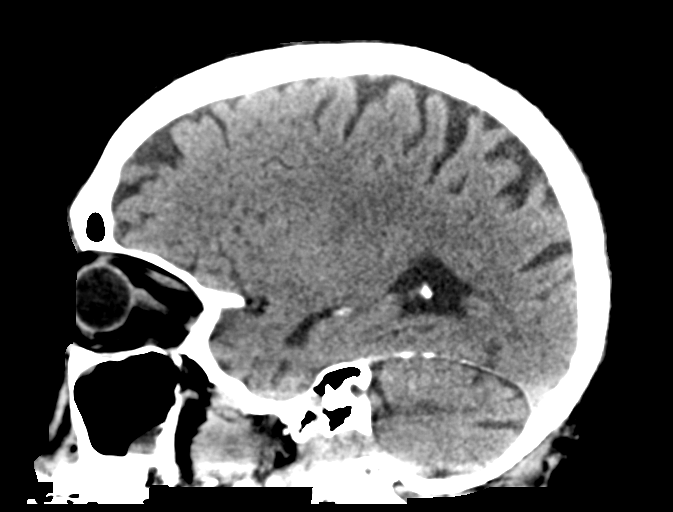
[im 29/57  brain]
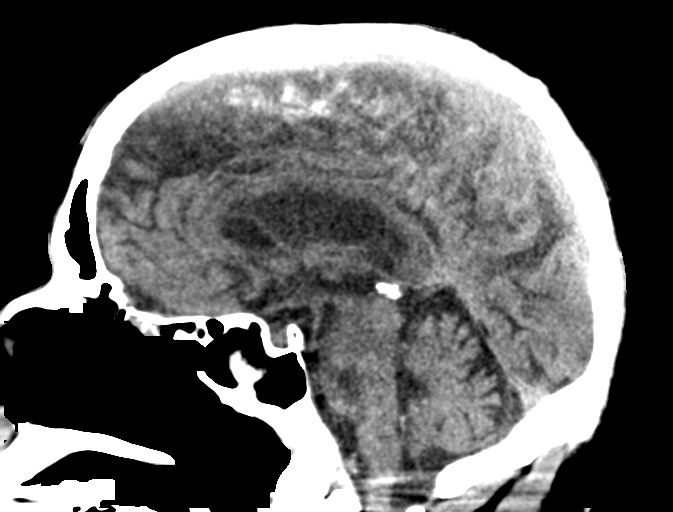
[im 38/57  brain]
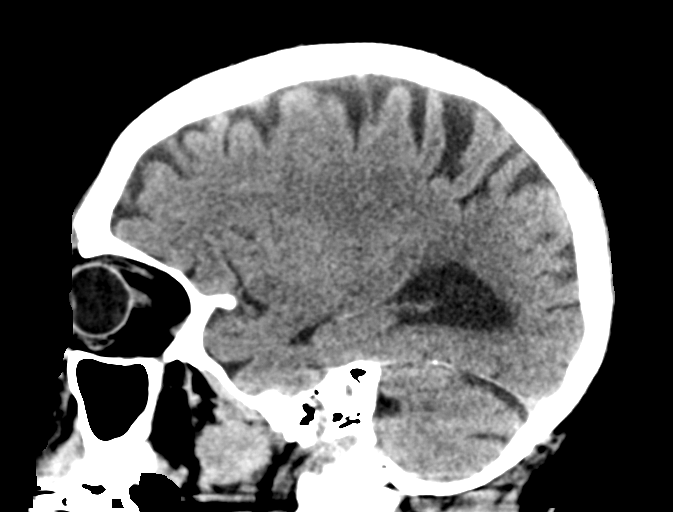

[15 of 47 positions shown; findings below may reference images not displayed]

FINDINGS: Brain: Mild diffuse atrophy slightly increased from prior study.
There is no intracranial mass, hemorrhage, extra-axial fluid
collection, or midline shift. There is a focal infarct in the
posterior mid right cerebellum. There is also asymmetric decreased
attenuation in the dentate nucleus on the right compared to the
left. Recent infarcts in these areas of the right cerebellum are of
concern. Elsewhere there is mild small vessel disease in the centra
semiovale bilaterally.

Vascular: No hyperdense vessel. There is calcification in each
carotid siphon region.

Skull: The bony calvarium appears intact.

Sinuses/Orbits: There is mucosal thickening in each maxillary
antrum. There is a retention cyst in the inferior right maxillary
antrum. There is opacification in multiple ethmoid air cells. There
is mucosal thickening in the anterior left sphenoid sinus. Orbits
appear symmetric bilaterally.

Other: Mastoid air cells are clear. There is debris in the right
external auditory canal.
IMPRESSION: Mild diffuse atrophy, slightly increased from prior study. Suspect
recent and potentially acute infarct in the posterior mid right
cerebellum. There is decreased attenuation in the dentate nucleus of
the right cerebellum compared to the left side which may represent a
second nearby focus of recent and possibly acute infarct. Elsewhere
there is mild periventricular small vessel disease. No mass or
hemorrhage.

There are foci of arterial vascular calcification. There is
multifocal paranasal sinus disease. There is probable cerumen in the
right external auditory canal.
